# Patient Record
Sex: Female | Born: 1955 | Race: White | Hispanic: No | Marital: Married | Smoking: Never smoker
Health system: Southern US, Community
[De-identification: ages and names within clinical notes are randomized; demographics above are authoritative.]

## PROBLEM LIST (undated history)

## (undated) DIAGNOSIS — M109 Gout, unspecified: Secondary | ICD-10-CM

## (undated) DIAGNOSIS — E039 Hypothyroidism, unspecified: Secondary | ICD-10-CM

## (undated) DIAGNOSIS — M1990 Unspecified osteoarthritis, unspecified site: Secondary | ICD-10-CM

## (undated) DIAGNOSIS — E782 Mixed hyperlipidemia: Secondary | ICD-10-CM

## (undated) DIAGNOSIS — M545 Low back pain, unspecified: Secondary | ICD-10-CM

## (undated) DIAGNOSIS — I4819 Other persistent atrial fibrillation: Secondary | ICD-10-CM

## (undated) DIAGNOSIS — E785 Hyperlipidemia, unspecified: Secondary | ICD-10-CM

## (undated) DIAGNOSIS — Z9221 Personal history of antineoplastic chemotherapy: Secondary | ICD-10-CM

## (undated) DIAGNOSIS — E079 Disorder of thyroid, unspecified: Secondary | ICD-10-CM

## (undated) DIAGNOSIS — I340 Nonrheumatic mitral (valve) insufficiency: Secondary | ICD-10-CM

## (undated) DIAGNOSIS — Z7901 Long term (current) use of anticoagulants: Secondary | ICD-10-CM

## (undated) DIAGNOSIS — C50919 Malignant neoplasm of unspecified site of unspecified female breast: Secondary | ICD-10-CM

## (undated) DIAGNOSIS — T884XXA Failed or difficult intubation, initial encounter: Secondary | ICD-10-CM

## (undated) DIAGNOSIS — T7840XA Allergy, unspecified, initial encounter: Secondary | ICD-10-CM

## (undated) DIAGNOSIS — R0609 Other forms of dyspnea: Secondary | ICD-10-CM

## (undated) DIAGNOSIS — N840 Polyp of corpus uteri: Secondary | ICD-10-CM

## (undated) DIAGNOSIS — Z8489 Family history of other specified conditions: Secondary | ICD-10-CM

## (undated) DIAGNOSIS — I4891 Unspecified atrial fibrillation: Secondary | ICD-10-CM

## (undated) DIAGNOSIS — I499 Cardiac arrhythmia, unspecified: Secondary | ICD-10-CM

## (undated) DIAGNOSIS — Z923 Personal history of irradiation: Secondary | ICD-10-CM

## (undated) DIAGNOSIS — I952 Hypotension due to drugs: Secondary | ICD-10-CM

## (undated) DIAGNOSIS — M810 Age-related osteoporosis without current pathological fracture: Secondary | ICD-10-CM

## (undated) DIAGNOSIS — I10 Essential (primary) hypertension: Secondary | ICD-10-CM

## (undated) DIAGNOSIS — C50511 Malignant neoplasm of lower-outer quadrant of right female breast: Secondary | ICD-10-CM

## (undated) HISTORY — DX: Essential (primary) hypertension: I10

## (undated) HISTORY — DX: Low back pain, unspecified: M54.50

## (undated) HISTORY — DX: Unspecified atrial fibrillation: I48.91

## (undated) HISTORY — PX: JOINT REPLACEMENT: SHX530

## (undated) HISTORY — DX: Allergy, unspecified, initial encounter: T78.40XA

## (undated) HISTORY — DX: Low back pain: M54.5

## (undated) HISTORY — PX: CATARACT EXTRACTION: SUR2

## (undated) HISTORY — DX: Age-related osteoporosis without current pathological fracture: M81.0

## (undated) HISTORY — PX: REPLACEMENT TOTAL KNEE: SUR1224

## (undated) HISTORY — DX: Hyperlipidemia, unspecified: E78.5

## (undated) HISTORY — DX: Disorder of thyroid, unspecified: E07.9

---
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
## 2008-10-03 HISTORY — PX: REPLACEMENT TOTAL KNEE: SUR1224

## 2009-01-26 ENCOUNTER — Ambulatory Visit: Payer: Self-pay | Admitting: Unknown Physician Specialty

## 2009-02-02 ENCOUNTER — Ambulatory Visit: Payer: Self-pay | Admitting: Orthopedic Surgery

## 2009-02-10 ENCOUNTER — Inpatient Hospital Stay: Payer: Self-pay | Admitting: Orthopedic Surgery

## 2009-03-11 ENCOUNTER — Encounter: Payer: Self-pay | Admitting: Orthopedic Surgery

## 2009-04-02 ENCOUNTER — Encounter: Payer: Self-pay | Admitting: Orthopedic Surgery

## 2009-05-03 ENCOUNTER — Encounter: Payer: Self-pay | Admitting: Orthopedic Surgery

## 2009-06-04 LAB — HM MAMMOGRAPHY

## 2012-10-05 ENCOUNTER — Ambulatory Visit: Payer: Self-pay

## 2014-01-01 LAB — HM MAMMOGRAPHY

## 2014-01-01 LAB — HM PAP SMEAR

## 2014-02-20 DIAGNOSIS — I340 Nonrheumatic mitral (valve) insufficiency: Secondary | ICD-10-CM | POA: Insufficient documentation

## 2015-04-02 DIAGNOSIS — I10 Essential (primary) hypertension: Secondary | ICD-10-CM | POA: Insufficient documentation

## 2015-04-02 DIAGNOSIS — E782 Mixed hyperlipidemia: Secondary | ICD-10-CM | POA: Insufficient documentation

## 2015-05-01 ENCOUNTER — Other Ambulatory Visit: Payer: Self-pay | Admitting: Unknown Physician Specialty

## 2015-05-01 MED ORDER — LISINOPRIL-HYDROCHLOROTHIAZIDE 20-25 MG PO TABS: 1.0000 | ORAL_TABLET | Freq: Every day | ORAL | Status: DC

## 2015-07-24 ENCOUNTER — Encounter: Payer: Self-pay | Admitting: Family Medicine

## 2015-07-24 ENCOUNTER — Ambulatory Visit (INDEPENDENT_AMBULATORY_CARE_PROVIDER_SITE_OTHER): Payer: BLUE CROSS/BLUE SHIELD | Admitting: Family Medicine

## 2015-07-24 ENCOUNTER — Other Ambulatory Visit: Payer: Self-pay | Admitting: Family Medicine

## 2015-07-24 VITALS — BP 171/87 | HR 63 | Temp 98.2°F | Ht 61.4 in | Wt 274.0 lb

## 2015-07-24 DIAGNOSIS — B029 Zoster without complications: Secondary | ICD-10-CM

## 2015-07-24 DIAGNOSIS — E785 Hyperlipidemia, unspecified: Secondary | ICD-10-CM

## 2015-07-24 DIAGNOSIS — I10 Essential (primary) hypertension: Secondary | ICD-10-CM

## 2015-07-24 DIAGNOSIS — E039 Hypothyroidism, unspecified: Secondary | ICD-10-CM

## 2015-07-24 MED ORDER — VALACYCLOVIR HCL 1 G PO TABS: 1000.0000 mg | ORAL_TABLET | Freq: Three times a day (TID) | ORAL | Status: DC

## 2015-07-24 NOTE — Patient Instructions (Signed)

## 2015-07-24 NOTE — Progress Notes (Signed)
BP 171/87 mmHg  Pulse 63  Temp(Src) 98.2 F (36.8 C)  Ht 5' 1.4" (1.56 m)  Wt 274 lb (124.286 kg)  BMI 51.07 kg/m2  SpO2 97%   Subjective:    Patient ID: Wendy Marsh, female    DOB: Nov 25, 1955, 59 y.o.   MRN: [id number]  HPI: ALZENA Marsh is a 59 y.o. female  Chief Complaint  Patient presents with  . Rash    numbness X 1 week, noticed a rash yesterday0   RASH Duration:  Started yesterday  Location: trunk  Itching: yes Burning: yes Redness: yes Oozing: no Scaling: yes Blisters: yes Painful: yes Fevers: no Change in detergents/soaps/personal care products: no Recent illness: no Recent travel:no History of same: no Context: worse Alleviating factors: nothing Treatments attempted:nothing Shortness of breath: no  Throat/tongue swelling: no Myalgias/arthralgias: no  Relevant past medical, surgical, family and social history reviewed and updated as indicated. Interim medical history since our last visit reviewed. Allergies and medications reviewed and updated.  Review of Systems  Constitutional: Negative.   Respiratory: Negative.   Cardiovascular: Negative.   Musculoskeletal: Negative.   Skin: Positive for rash. Negative for color change, pallor and wound.  Psychiatric/Behavioral: Negative.     Per HPI unless specifically indicated above     Objective:    BP 171/87 mmHg  Pulse 63  Temp(Src) 98.2 F (36.8 C)  Ht 5' 1.4" (1.56 m)  Wt 274 lb (124.286 kg)  BMI 51.07 kg/m2  SpO2 97%  Wt Readings from Last 3 Encounters:  07/24/15 274 lb (124.286 kg)  07/24/15 282 lb (127.914 kg)    Physical Exam  Constitutional: She is oriented to person, place, and time. She appears well-developed and well-nourished. No distress.  HENT:  Head: Normocephalic and atraumatic.  Right Ear: Hearing normal.  Left Ear: Hearing normal.  Nose: Nose normal.  Eyes: Conjunctivae and lids are normal. Right eye exhibits no discharge. Left eye exhibits no discharge. No  scleral icterus.  Cardiovascular: Normal rate, regular rhythm, normal heart sounds and intact distal pulses.  Exam reveals no gallop and no friction rub.   No murmur heard. Pulmonary/Chest: Effort normal and breath sounds normal. No respiratory distress. She has no wheezes. She has no rales. She exhibits no tenderness.  Musculoskeletal: Normal range of motion.  Neurological: She is alert and oriented to person, place, and time.  Skin: Skin is dry and intact. Rash noted. No erythema. No pallor.  Vesicular rash in dermatomal pattern on the R side of her belly  Psychiatric: She has a normal mood and affect. Her speech is normal and behavior is normal. Judgment and thought content normal. Cognition and memory are normal.  Nursing note and vitals reviewed.   Results for orders placed or performed in visit on 07/24/15  HM MAMMOGRAPHY  Result Value Ref Range   HM Mammogram Practice Partner   HM MAMMOGRAPHY  Result Value Ref Range   HM Mammogram Practice Partner   HM MAMMOGRAPHY  Result Value Ref Range   HM Mammogram Practice Partner   HM PAP SMEAR  Result Value Ref Range   HM Pap smear Practice Partner       Assessment & Plan:   Problem List Items Addressed This Visit    None    Visit Diagnoses    Shingles    -  Primary    Will treat with valacyclovir. Information given. Call if not getting better or getting worse.     Relevant Medications  valACYclovir (VALTREX) 1000 MG tablet        Follow up plan: Return if symptoms worsen or fail to improve.

## 2015-07-27 ENCOUNTER — Telehealth: Payer: Self-pay | Admitting: Family Medicine

## 2015-07-27 NOTE — Telephone Encounter (Signed)
Once the rash is dry, she can go back

## 2015-07-27 NOTE — Telephone Encounter (Signed)
Patient notified, she will need a work note, told patient to contact us when the rash dries up.

## 2015-07-27 NOTE — Telephone Encounter (Signed)
Forward to provider

## 2015-07-27 NOTE — Telephone Encounter (Signed)
Pt called and stated that she was wondering when she could go back to work. She says she works in [employer] and that one of her coworkers is pregnant.

## 2015-07-29 ENCOUNTER — Ambulatory Visit: Payer: Self-pay | Admitting: Unknown Physician Specialty

## 2015-08-03 ENCOUNTER — Telehealth: Payer: Self-pay | Admitting: Family Medicine

## 2015-08-03 NOTE — Telephone Encounter (Signed)
Pt would like a note to go back to work.

## 2015-08-03 NOTE — Telephone Encounter (Signed)
Is it ok to write a return to work note for this patient?

## 2015-08-03 NOTE — Telephone Encounter (Signed)
As long as her rash is dried up we can send her back to work

## 2015-08-14 ENCOUNTER — Other Ambulatory Visit: Payer: Self-pay

## 2015-08-14 MED ORDER — METOPROLOL SUCCINATE ER 50 MG PO TB24: 50.0000 mg | ORAL_TABLET | Freq: Every day | ORAL | Status: DC

## 2015-08-14 NOTE — Telephone Encounter (Signed)
Patient was last seen in October with Dr. Lenna Sciara and last saw [facility] on 02/23/15. Practice partner number is [phone], and pharmacy is [facility].

## 2015-09-15 ENCOUNTER — Other Ambulatory Visit: Payer: Self-pay

## 2015-09-15 MED ORDER — METOPROLOL SUCCINATE ER 50 MG PO TB24: 50.0000 mg | ORAL_TABLET | Freq: Every day | ORAL | Status: DC

## 2015-09-15 NOTE — Telephone Encounter (Signed)
Patient was seen in October for shingles and last saw Malachy Mood for a physical 02/23/15. Practice partner number is [phone] and pharmacy is [facility].

## 2015-09-22 ENCOUNTER — Encounter: Payer: Self-pay | Admitting: Family Medicine

## 2015-09-22 DIAGNOSIS — M1711 Unilateral primary osteoarthritis, right knee: Secondary | ICD-10-CM | POA: Insufficient documentation

## 2015-10-19 ENCOUNTER — Other Ambulatory Visit: Payer: Self-pay

## 2015-10-19 MED ORDER — METOPROLOL SUCCINATE ER 50 MG PO TB24: 50.0000 mg | ORAL_TABLET | Freq: Every day | ORAL | Status: DC

## 2015-10-19 NOTE — Telephone Encounter (Signed)
Patient was last seen for physical by Malachy Mood 02/23/15 and saw Dr. Wynetta Emery for shingles in October. Pharmacy is [facility].

## 2015-11-11 ENCOUNTER — Other Ambulatory Visit: Payer: Self-pay

## 2015-11-11 MED ORDER — METOPROLOL SUCCINATE ER 50 MG PO TB24: 50.0000 mg | ORAL_TABLET | Freq: Every day | ORAL | Status: DC

## 2015-11-11 NOTE — Telephone Encounter (Signed)
Patient has physical scheduled for 02/26/16. Pharmacy is [facility].

## 2015-11-19 ENCOUNTER — Other Ambulatory Visit: Payer: Self-pay | Admitting: Orthopedic Surgery

## 2015-11-19 DIAGNOSIS — M1711 Unilateral primary osteoarthritis, right knee: Secondary | ICD-10-CM

## 2015-11-23 ENCOUNTER — Ambulatory Visit
Admission: RE | Admit: 2015-11-23 | Discharge: 2015-11-23 | Disposition: A | Discharge status: Home / Self Care | Payer: BLUE CROSS/BLUE SHIELD | Source: Ambulatory Visit | Attending: Orthopedic Surgery | Admitting: Orthopedic Surgery

## 2015-11-23 DIAGNOSIS — M2241 Chondromalacia patellae, right knee: Secondary | ICD-10-CM | POA: Insufficient documentation

## 2015-11-23 DIAGNOSIS — M25561 Pain in right knee: Secondary | ICD-10-CM | POA: Insufficient documentation

## 2015-11-23 DIAGNOSIS — M25461 Effusion, right knee: Secondary | ICD-10-CM | POA: Diagnosis not present

## 2015-11-23 DIAGNOSIS — M1711 Unilateral primary osteoarthritis, right knee: Secondary | ICD-10-CM | POA: Insufficient documentation

## 2015-11-23 DIAGNOSIS — S83231A Complex tear of medial meniscus, current injury, right knee, initial encounter: Secondary | ICD-10-CM | POA: Insufficient documentation

## 2015-12-10 ENCOUNTER — Other Ambulatory Visit: Payer: Self-pay

## 2015-12-10 MED ORDER — METOPROLOL SUCCINATE ER 50 MG PO TB24: 50.0000 mg | ORAL_TABLET | Freq: Every day | ORAL | Status: DC

## 2015-12-10 NOTE — Telephone Encounter (Signed)
Patient has physical scheduled for 02/26/16 and pharmacy is [facility].

## 2016-01-18 ENCOUNTER — Other Ambulatory Visit: Payer: Self-pay

## 2016-01-18 MED ORDER — METOPROLOL SUCCINATE ER 50 MG PO TB24: 50.0000 mg | ORAL_TABLET | Freq: Every day | ORAL | Status: DC

## 2016-01-18 NOTE — Telephone Encounter (Signed)
Patient has upcoming appointment 02/26/16 and pharmacy is [facility].

## 2016-01-22 DIAGNOSIS — I480 Paroxysmal atrial fibrillation: Secondary | ICD-10-CM | POA: Insufficient documentation

## 2016-02-10 ENCOUNTER — Other Ambulatory Visit: Payer: Self-pay

## 2016-02-10 MED ORDER — METOPROLOL SUCCINATE ER 50 MG PO TB24: 50.0000 mg | ORAL_TABLET | Freq: Every day | ORAL | Status: DC

## 2016-02-10 NOTE — Telephone Encounter (Signed)
Patient has upcoming appointment 02/26/16. Pharmacy is [facility].

## 2016-02-26 ENCOUNTER — Encounter: Payer: Self-pay | Admitting: Unknown Physician Specialty

## 2016-02-26 ENCOUNTER — Ambulatory Visit (INDEPENDENT_AMBULATORY_CARE_PROVIDER_SITE_OTHER): Payer: BLUE CROSS/BLUE SHIELD | Admitting: Unknown Physician Specialty

## 2016-02-26 VITALS — BP 165/88 | HR 71 | Temp 98.3°F | Ht 61.8 in | Wt 284.8 lb

## 2016-02-26 DIAGNOSIS — Z0000 Encounter for general adult medical examination without abnormal findings: Secondary | ICD-10-CM

## 2016-02-26 DIAGNOSIS — I10 Essential (primary) hypertension: Secondary | ICD-10-CM | POA: Diagnosis not present

## 2016-02-26 DIAGNOSIS — E039 Hypothyroidism, unspecified: Secondary | ICD-10-CM

## 2016-02-26 LAB — MICROALBUMIN, URINE WAIVED
CREATININE, URINE WAIVED: 100 mg/dL (ref 10–300)
Microalb, Ur Waived: 10 mg/L (ref 0–19)

## 2016-02-26 MED ORDER — LEVOTHYROXINE SODIUM 175 MCG PO TABS: 175.0000 ug | ORAL_TABLET | Freq: Every day | ORAL | Status: DC

## 2016-02-26 MED ORDER — LISINOPRIL-HYDROCHLOROTHIAZIDE 20-25 MG PO TABS: 1.0000 | ORAL_TABLET | Freq: Every day | ORAL | Status: DC

## 2016-02-26 MED ORDER — METOPROLOL SUCCINATE ER 50 MG PO TB24: 50.0000 mg | ORAL_TABLET | Freq: Every day | ORAL | Status: DC

## 2016-02-26 NOTE — Assessment & Plan Note (Signed)
Just had a visit with the cardiologist last month and it was good.  She will monitor at home.  Will give a handout on dash diet

## 2016-02-26 NOTE — Progress Notes (Signed)
BP 165/88 mmHg  Pulse 71  Temp(Src) 98.3 F (36.8 C)  Ht 5' 1.8" (1.57 m)  Wt 284 lb 12.8 oz (129.184 kg)  BMI 52.41 kg/m2  SpO2 95%  LMP 03/17/2009 (Approximate)   Subjective:    Patient ID: Wendy Marsh, female    DOB: [birth year], 60 y.o.   MRN: [id number]  HPI: [name] is a 60 y.o. female  Chief Complaint  Patient presents with  . Annual Exam   Hypertension Using medications without difficulty Average home BPs this was OK at the cardiologist last and had a full w/u.     No problems or lightheadedness No chest pain with exertion or shortness of breath No Edema  Social History   Social History  . Marital Status: Married    Spouse Name: N/A  . Number of Children: N/A  . Years of Education: N/A   Occupational History  . Not on file.   Social History Main Topics  . Smoking status: Never Smoker   . Smokeless tobacco: Never Used  . Alcohol Use: No  . Drug Use: No  . Sexual Activity: Yes    Birth Control/ Protection: Post-menopausal   Other Topics Concern  . Not on file   Social History Narrative   Past Medical History  Diagnosis Date  . Osteoporosis   . Hyperlipidemia   . Hypertension   . Thyroid disease   . Asthma   . Lumbago   . Atrial fibrillation [facility])    Past Surgical History  Procedure Laterality Date  . Replacement total knee     Family History  Problem Relation Age of Onset  . Diabetes Mother   . Cancer Father     ESOPHAGEAL  . Heart disease Father   . Cancer Sister     BREAST AND ESOPHAGEAL  . Diabetes Sister   . Hypertension Sister   . Kidney disease Sister   . Thyroid disease Sister       Relevant past medical, surgical, family and social history reviewed and updated as indicated. Interim medical history since our last visit reviewed. Allergies and medications reviewed and updated.  Review of Systems  Constitutional: Positive for activity change.       Decreased activity due to knee pain  HENT: Negative.    Eyes: Negative.   Respiratory: Negative.   Cardiovascular: Negative.   Gastrointestinal: Negative.   Endocrine: Negative.   Genitourinary: Negative.   Musculoskeletal:       Trouble with right knee and under the care of Orthopedics  Skin: Negative.   Allergic/Immunologic: Negative.   Neurological: Negative.   Hematological: Negative.   Psychiatric/Behavioral: Negative.     Per HPI unless specifically indicated above     Objective:    BP 165/88 mmHg  Pulse 71  Temp(Src) 98.3 F (36.8 C)  Ht 5' 1.8" (1.57 m)  Wt 284 lb 12.8 oz (129.184 kg)  BMI 52.41 kg/m2  SpO2 95%  LMP 03/17/2009 (Approximate)  Wt Readings from Last 3 Encounters:  02/26/16 284 lb 12.8 oz (129.184 kg)  07/24/15 274 lb (124.286 kg)  07/24/15 282 lb (127.914 kg)    Physical Exam  Constitutional: She is oriented to person, place, and time. She appears well-developed and well-nourished.  HENT:  Head: Normocephalic and atraumatic.  Eyes: Pupils are equal, round, and reactive to light. Right eye exhibits no discharge. Left eye exhibits no discharge. No scleral icterus.  Neck: Normal range of motion. Neck supple. Carotid bruit is not  present. No thyromegaly present.  Cardiovascular: Normal rate, regular rhythm and normal heart sounds.  Exam reveals no gallop and no friction rub.   No murmur heard. Pulmonary/Chest: Effort normal and breath sounds normal. No respiratory distress. She has no wheezes. She has no rales.  Abdominal: Soft. Bowel sounds are normal. There is no tenderness. There is no rebound.  Genitourinary: No breast swelling, tenderness or discharge.  Musculoskeletal: Normal range of motion.  Lymphadenopathy:    She has no cervical adenopathy.  Neurological: She is alert and oriented to person, place, and time.  Skin: Skin is warm, dry and intact. No rash noted.  Psychiatric: She has a normal mood and affect. Her speech is normal and behavior is normal. Judgment and thought content normal.  Cognition and memory are normal.    Results for orders placed or performed in visit on 07/24/15  HM MAMMOGRAPHY  Result Value Ref Range   HM Mammogram Practice Partner   HM MAMMOGRAPHY  Result Value Ref Range   HM Mammogram Practice Partner   HM MAMMOGRAPHY  Result Value Ref Range   HM Mammogram Practice Partner   HM PAP SMEAR  Result Value Ref Range   HM Pap smear Practice Partner       Assessment & Plan:   Problem List Items Addressed This Visit      Unprioritized   Hypertension - Primary    Just had a visit with the cardiologist last month and it was good.  She will monitor at home.  Will give a handout on dash diet      Relevant Medications   lisinopril-hydrochlorothiazide (PRINZIDE,ZESTORETIC) 20-25 MG tablet   metoprolol succinate (TOPROL-XL) 50 MG 24 hr tablet   Other Relevant Orders   Comprehensive metabolic panel   Lipid Panel w/o Chol/HDL Ratio   Uric acid   Microalbumin, Urine Waived   Thyroid activity decreased    Not taking thyroid medication as she forgot about it.  Will restart      Relevant Medications   levothyroxine (SYNTHROID, LEVOTHROID) 175 MCG tablet   metoprolol succinate (TOPROL-XL) 50 MG 24 hr tablet   Other Relevant Orders   TSH    Other Visit Diagnoses    Annual physical exam        Relevant Orders    MM DIGITAL SCREENING BILATERAL    CBC with Differential/Platelet        Follow up plan: Return in about 4 weeks (around 03/25/2016) for BP.

## 2016-02-26 NOTE — Patient Instructions (Signed)
DASH Eating Plan  DASH stands for "Dietary Approaches to Stop Hypertension." The DASH eating plan is a healthy eating plan that has been shown to reduce high blood pressure (hypertension). Additional health benefits may include reducing the risk of type 2 diabetes mellitus, heart disease, and stroke. The DASH eating plan may also help with weight loss.  WHAT DO I NEED TO KNOW ABOUT THE DASH EATING PLAN?  For the DASH eating plan, you will follow these general guidelines:  · Choose foods with a percent daily value for sodium of less than 5% (as listed on the food label).  · Use salt-free seasonings or herbs instead of table salt or sea salt.  · Check with your health care provider or pharmacist before using salt substitutes.  · Eat lower-sodium products, often labeled as "lower sodium" or "no salt added."  · Eat fresh foods.  · Eat more vegetables, fruits, and low-fat dairy products.  · Choose whole grains. Look for the word "whole" as the first word in the ingredient list.  · Choose fish and skinless chicken or turkey more often than red meat. Limit fish, poultry, and meat to 6 oz (170 g) each day.  · Limit sweets, desserts, sugars, and sugary drinks.  · Choose heart-healthy fats.  · Limit cheese to 1 oz (28 g) per day.  · Eat more home-cooked food and less restaurant, buffet, and fast food.  · Limit fried foods.  · Cook foods using methods other than frying.  · Limit canned vegetables. If you do use them, rinse them well to decrease the sodium.  · When eating at a restaurant, ask that your food be prepared with less salt, or no salt if possible.  WHAT FOODS CAN I EAT?  Seek help from a dietitian for individual calorie needs.  Grains  Whole grain or whole wheat bread. Brown rice. Whole grain or whole wheat pasta. Quinoa, bulgur, and whole grain cereals. Low-sodium cereals. Corn or whole wheat flour tortillas. Whole grain cornbread. Whole grain crackers. Low-sodium crackers.  Vegetables  Fresh or frozen vegetables  (raw, steamed, roasted, or grilled). Low-sodium or reduced-sodium tomato and vegetable juices. Low-sodium or reduced-sodium tomato sauce and paste. Low-sodium or reduced-sodium canned vegetables.   Fruits  All fresh, canned (in natural juice), or frozen fruits.  Meat and Other Protein Products  Ground beef (85% or leaner), grass-fed beef, or beef trimmed of fat. Skinless chicken or turkey. Ground chicken or turkey. Pork trimmed of fat. All fish and seafood. Eggs. Dried beans, peas, or lentils. Unsalted nuts and seeds. Unsalted canned beans.  Dairy  Low-fat dairy products, such as skim or 1% milk, 2% or reduced-fat cheeses, low-fat ricotta or cottage cheese, or plain low-fat yogurt. Low-sodium or reduced-sodium cheeses.  Fats and Oils  Tub margarines without trans fats. Light or reduced-fat mayonnaise and salad dressings (reduced sodium). Avocado. Safflower, olive, or canola oils. Natural peanut or almond butter.  Other  Unsalted popcorn and pretzels.  The items listed above may not be a complete list of recommended foods or beverages. Contact your dietitian for more options.  WHAT FOODS ARE NOT RECOMMENDED?  Grains  White bread. White pasta. White rice. Refined cornbread. Bagels and croissants. Crackers that contain trans fat.  Vegetables  Creamed or fried vegetables. Vegetables in a cheese sauce. Regular canned vegetables. Regular canned tomato sauce and paste. Regular tomato and vegetable juices.  Fruits  Dried fruits. Canned fruit in light or heavy syrup. Fruit juice.  Meat and Other Protein   Products  Fatty cuts of meat. Ribs, chicken wings, bacon, sausage, bologna, salami, chitterlings, fatback, hot dogs, bratwurst, and packaged luncheon meats. Salted nuts and seeds. Canned beans with salt.  Dairy  Whole or 2% milk, cream, half-and-half, and cream cheese. Whole-fat or sweetened yogurt. Full-fat cheeses or blue cheese. Nondairy creamers and whipped toppings. Processed cheese, cheese spreads, or cheese  curds.  Condiments  Onion and garlic salt, seasoned salt, table salt, and sea salt. Canned and packaged gravies. Worcestershire sauce. Tartar sauce. Barbecue sauce. Teriyaki sauce. Soy sauce, including reduced sodium. Steak sauce. Fish sauce. Oyster sauce. Cocktail sauce. Horseradish. Ketchup and mustard. Meat flavorings and tenderizers. Bouillon cubes. Hot sauce. Tabasco sauce. Marinades. Taco seasonings. Relishes.  Fats and Oils  Butter, stick margarine, lard, shortening, ghee, and bacon fat. Coconut, palm kernel, or palm oils. Regular salad dressings.  Other  Pickles and olives. Salted popcorn and pretzels.  The items listed above may not be a complete list of foods and beverages to avoid. Contact your dietitian for more information.  WHERE CAN I FIND MORE INFORMATION?  National Heart, Lung, and Blood Institute: www.nhlbi.nih.gov/health/health-topics/topics/dash/     This information is not intended to replace advice given to you by your health care provider. Make sure you discuss any questions you have with your health care provider.     Document Released: 09/08/2011 Document Revised: 10/10/2014 Document Reviewed: 07/24/2013  Elsevier Interactive Patient Education ©2016 Elsevier Inc.

## 2016-02-26 NOTE — Assessment & Plan Note (Signed)
Not taking thyroid medication as she forgot about it.  Will restart

## 2016-02-26 NOTE — Addendum Note (Signed)
Addended by: Kathrine Haddock on: 02/26/2016 03:18 PM   Modules accepted: Miquel Dunn

## 2016-02-27 LAB — COMPREHENSIVE METABOLIC PANEL
ALBUMIN: 4.3 g/dL (ref 3.6–4.8)
ALK PHOS: 75 IU/L (ref 39–117)
ALT: 13 IU/L (ref 0–32)
AST: 14 IU/L (ref 0–40)
Albumin/Globulin Ratio: 1.6 (ref 1.2–2.2)
BUN / CREAT RATIO: 20 (ref 12–28)
BUN: 13 mg/dL (ref 8–27)
Bilirubin Total: 0.2 mg/dL (ref 0.0–1.2)
CO2: 28 mmol/L (ref 18–29)
CREATININE: 0.66 mg/dL (ref 0.57–1.00)
Calcium: 9.6 mg/dL (ref 8.7–10.3)
Chloride: 97 mmol/L (ref 96–106)
GFR calc Af Amer: 111 mL/min/{1.73_m2} (ref >59)
GFR calc non Af Amer: 96 mL/min/{1.73_m2} (ref >59)
GLUCOSE: 89 mg/dL (ref 65–99)
Globulin, Total: 2.7 g/dL (ref 1.5–4.5)
Potassium: 4 mmol/L (ref 3.5–5.2)
Sodium: 143 mmol/L (ref 134–144)
Total Protein: 7 g/dL (ref 6.0–8.5)

## 2016-02-27 LAB — CBC WITH DIFFERENTIAL/PLATELET
BASOS ABS: 0 10*3/uL (ref 0.0–0.2)
Basos: 0 %
EOS (ABSOLUTE): 0.4 10*3/uL (ref 0.0–0.4)
Eos: 4 %
HEMOGLOBIN: 11.8 g/dL (ref 11.1–15.9)
Hematocrit: 37.8 % (ref 34.0–46.6)
IMMATURE GRANS (ABS): 0 10*3/uL (ref 0.0–0.1)
IMMATURE GRANULOCYTES: 0 %
LYMPHS: 17 %
Lymphocytes Absolute: 1.7 10*3/uL (ref 0.7–3.1)
MCH: 26.9 pg (ref 26.6–33.0)
MCHC: 31.2 g/dL — ABNORMAL LOW (ref 31.5–35.7)
MCV: 86 fL (ref 79–97)
MONOCYTES: 6 %
Monocytes Absolute: 0.6 10*3/uL (ref 0.1–0.9)
NEUTROS PCT: 73 %
Neutrophils Absolute: 7.2 10*3/uL — ABNORMAL HIGH (ref 1.4–7.0)
PLATELETS: 335 10*3/uL (ref 150–379)
RBC: 4.39 x10E6/uL (ref 3.77–5.28)
RDW: 15.1 % (ref 12.3–15.4)
WBC: 9.8 10*3/uL (ref 3.4–10.8)

## 2016-02-27 LAB — LIPID PANEL W/O CHOL/HDL RATIO
CHOLESTEROL TOTAL: 232 mg/dL — AB (ref 100–199)
HDL: 56 mg/dL (ref >39)
LDL CALC: 153 mg/dL — AB (ref 0–99)
Triglycerides: 114 mg/dL (ref 0–149)
VLDL CHOLESTEROL CAL: 23 mg/dL (ref 5–40)

## 2016-02-27 LAB — URIC ACID: URIC ACID: 6.4 mg/dL (ref 2.5–7.1)

## 2016-02-27 LAB — TSH: TSH: 8.63 u[IU]/mL — AB (ref 0.450–4.500)

## 2016-03-01 ENCOUNTER — Encounter: Payer: Self-pay | Admitting: Unknown Physician Specialty

## 2016-03-14 ENCOUNTER — Other Ambulatory Visit: Payer: Self-pay

## 2016-03-14 NOTE — Telephone Encounter (Signed)
Called and spoke to patient because this pharmacy was not listed as her preferred pharmacy. She stated that she is now using them and needs her medications sent to them. Pharmacy is [facility].

## 2016-03-15 MED ORDER — LEVOTHYROXINE SODIUM 175 MCG PO TABS: 175.0000 ug | ORAL_TABLET | Freq: Every day | ORAL | Status: DC

## 2016-03-15 MED ORDER — METOPROLOL SUCCINATE ER 50 MG PO TB24: 50.0000 mg | ORAL_TABLET | Freq: Every day | ORAL | Status: DC

## 2016-03-25 ENCOUNTER — Ambulatory Visit: Payer: BLUE CROSS/BLUE SHIELD | Admitting: Family Medicine

## 2016-04-11 ENCOUNTER — Encounter: Payer: Self-pay | Admitting: Family Medicine

## 2016-04-11 ENCOUNTER — Ambulatory Visit (INDEPENDENT_AMBULATORY_CARE_PROVIDER_SITE_OTHER): Payer: BLUE CROSS/BLUE SHIELD | Admitting: Family Medicine

## 2016-04-11 VITALS — BP 146/81 | HR 62 | Temp 98.9°F | Ht 61.8 in | Wt 286.0 lb

## 2016-04-11 DIAGNOSIS — I10 Essential (primary) hypertension: Secondary | ICD-10-CM | POA: Diagnosis not present

## 2016-04-11 DIAGNOSIS — E039 Hypothyroidism, unspecified: Secondary | ICD-10-CM

## 2016-04-11 MED ORDER — METOPROLOL SUCCINATE ER 50 MG PO TB24: 50.0000 mg | ORAL_TABLET | Freq: Every day | ORAL | Status: DC

## 2016-04-11 MED ORDER — LISINOPRIL-HYDROCHLOROTHIAZIDE 20-25 MG PO TABS: 1.0000 | ORAL_TABLET | Freq: Every day | ORAL | Status: DC

## 2016-04-11 NOTE — Assessment & Plan Note (Signed)
Better on recheck. Work on diet and exercise. Checking labs today. Refills on medicine given today.

## 2016-04-11 NOTE — Assessment & Plan Note (Signed)
Rechecking levels today. Will adjust as needed. Call with any concerns.  

## 2016-04-11 NOTE — Progress Notes (Signed)
BP 146/81 mmHg  Pulse 62  Temp(Src) 98.9 F (37.2 C)  Ht 5' 1.8" (1.57 m)  Wt 286 lb (129.729 kg)  BMI 52.63 kg/m2  SpO2 96%  LMP 03/17/2009 (Approximate)   Subjective:    Patient ID: Wendy Marsh, female    DOB: [birth year], 60 y.o.   MRN: [id number]  HPI: Wendy Marsh is a 61 y.o. female  Chief Complaint  Patient presents with  . Hypertension  . Hypothyroidism   HYPERTENSION Hypertension status: stable  Satisfied with current treatment? yes Duration of hypertension: chronic BP monitoring frequency:  not checking BP medication side effects:  no Medication compliance: good compliance Aspirin: yes Recurrent headaches: no Visual changes: no Palpitations: yes Dyspnea: no Chest pain: no Lower extremity edema: yes Dizzy/lightheaded: no  HYPOTHYROIDISM Thyroid control status:better Satisfied with current treatment? yes Medication side effects: no Medication compliance: good compliance Recent dose adjustment:no Fatigue: yes Cold intolerance: no Heat intolerance: no Weight gain: no Weight loss: no Constipation: no Diarrhea/loose stools: no Palpitations: yes- with her a fibs Lower extremity edema: yes- not overly concerned, worse in the last year, but more sendentary- better with walking Anxiety/depressed mood: no  Relevant past medical, surgical, family and social history reviewed and updated as indicated. Interim medical history since our last visit reviewed. Allergies and medications reviewed and updated.  Review of Systems  Constitutional: Negative.   Respiratory: Negative.   Cardiovascular: Negative.   Musculoskeletal: Negative for myalgias, back pain, joint swelling, arthralgias, gait problem, neck pain and neck stiffness.  Psychiatric/Behavioral: Negative.     Per HPI unless specifically indicated above     Objective:    BP 146/81 mmHg  Pulse 62  Temp(Src) 98.9 F (37.2 C)  Ht 5' 1.8" (1.57 m)  Wt 286 lb (129.729 kg)  BMI 52.63 kg/m2   SpO2 96%  LMP 03/17/2009 (Approximate)  Wt Readings from Last 3 Encounters:  04/11/16 286 lb (129.729 kg)  02/26/16 284 lb 12.8 oz (129.184 kg)  07/24/15 274 lb (124.286 kg)    Physical Exam  Constitutional: She is oriented to person, place, and time. She appears well-developed and well-nourished. No distress.  HENT:  Head: Normocephalic and atraumatic.  Right Ear: Hearing normal.  Left Ear: Hearing normal.  Nose: Nose normal.  Eyes: Conjunctivae and lids are normal. Right eye exhibits no discharge. Left eye exhibits no discharge. No scleral icterus.  Cardiovascular: Normal rate, regular rhythm and intact distal pulses.  Exam reveals no gallop and no friction rub.   No murmur heard. Pulmonary/Chest: Effort normal and breath sounds normal. No respiratory distress. She has no wheezes. She has no rales. She exhibits no tenderness.  Musculoskeletal: Normal range of motion.  Neurological: She is alert and oriented to person, place, and time.  Skin: Skin is warm, dry and intact. No rash noted. She is not diaphoretic. No erythema. No pallor.  Psychiatric: She has a normal mood and affect. Her speech is normal and behavior is normal. Judgment and thought content normal. Cognition and memory are normal.  Nursing note and vitals reviewed.   Results for orders placed or performed in visit on 02/26/16  Comprehensive metabolic panel  Result Value Ref Range   Glucose 89 65 - 99 mg/dL   BUN 13 8 - 27 mg/dL   Creatinine, Ser 0.66 0.57 - 1.00 mg/dL   GFR calc non Af Amer 96 >59 mL/min/1.73   GFR calc Af Amer 111 >59 mL/min/1.73   BUN/Creatinine Ratio 20 12 - 28  Sodium 143 134 - 144 mmol/L   Potassium 4.0 3.5 - 5.2 mmol/L   Chloride 97 96 - 106 mmol/L   CO2 28 18 - 29 mmol/L   Calcium 9.6 8.7 - 10.3 mg/dL   Total Protein 7.0 6.0 - 8.5 g/dL   Albumin 4.3 3.6 - 4.8 g/dL   Globulin, Total 2.7 1.5 - 4.5 g/dL   Albumin/Globulin Ratio 1.6 1.2 - 2.2   Bilirubin Total 0.2 0.0 - 1.2 mg/dL    Alkaline Phosphatase 75 39 - 117 IU/L   AST 14 0 - 40 IU/L   ALT 13 0 - 32 IU/L  TSH  Result Value Ref Range   TSH 8.630 (H) 0.450 - 4.500 uIU/mL  Lipid Panel w/o Chol/HDL Ratio  Result Value Ref Range   Cholesterol, Total 232 (H) 100 - 199 mg/dL   Triglycerides 114 0 - 149 mg/dL   HDL 56 >39 mg/dL   VLDL Cholesterol Cal 23 5 - 40 mg/dL   LDL Calculated 153 (H) 0 - 99 mg/dL  CBC with Differential/Platelet  Result Value Ref Range   WBC 9.8 3.4 - 10.8 x10E3/uL   RBC 4.39 3.77 - 5.28 x10E6/uL   Hemoglobin 11.8 11.1 - 15.9 g/dL   Hematocrit 37.8 34.0 - 46.6 %   MCV 86 79 - 97 fL   MCH 26.9 26.6 - 33.0 pg   MCHC 31.2 (L) 31.5 - 35.7 g/dL   RDW 15.1 12.3 - 15.4 %   Platelets 335 150 - 379 x10E3/uL   Neutrophils 73 %   Lymphs 17 %   Monocytes 6 %   Eos 4 %   Basos 0 %   Neutrophils Absolute 7.2 (H) 1.4 - 7.0 x10E3/uL   Lymphocytes Absolute 1.7 0.7 - 3.1 x10E3/uL   Monocytes Absolute 0.6 0.1 - 0.9 x10E3/uL   EOS (ABSOLUTE) 0.4 0.0 - 0.4 x10E3/uL   Basophils Absolute 0.0 0.0 - 0.2 x10E3/uL   Immature Granulocytes 0 %   Immature Grans (Abs) 0.0 0.0 - 0.1 x10E3/uL  Uric acid  Result Value Ref Range   Uric Acid 6.4 2.5 - 7.1 mg/dL  Microalbumin, Urine Waived  Result Value Ref Range   Microalb, Ur Waived 10 0 - 19 mg/L   Creatinine, Urine Waived 100 10 - 300 mg/dL   Microalb/Creat Ratio <30 <30 mg/g      Assessment & Plan:   Problem List Items Addressed This Visit      Cardiovascular and Mediastinum   Hypertension    Better on recheck. Work on diet and exercise. Checking labs today. Refills on medicine given today.      Relevant Medications   lisinopril-hydrochlorothiazide (PRINZIDE,ZESTORETIC) 20-25 MG tablet   metoprolol succinate (TOPROL-XL) 50 MG 24 hr tablet   Other Relevant Orders   Comprehensive metabolic panel     Endocrine   Thyroid activity decreased - Primary    Rechecking levels today. Will adjust as needed. Call with any concerns.       Relevant  Medications   metoprolol succinate (TOPROL-XL) 50 MG 24 hr tablet   Other Relevant Orders   Thyroid Panel With TSH       Follow up plan: Return in about 6 months (around 10/12/2016) for BP follow up.

## 2016-04-11 NOTE — Patient Instructions (Signed)
DASH Eating Plan  DASH stands for "Dietary Approaches to Stop Hypertension." The DASH eating plan is a healthy eating plan that has been shown to reduce high blood pressure (hypertension). Additional health benefits may include reducing the risk of type 2 diabetes mellitus, heart disease, and stroke. The DASH eating plan may also help with weight loss.  WHAT DO I NEED TO KNOW ABOUT THE DASH EATING PLAN?  For the DASH eating plan, you will follow these general guidelines:  · Choose foods with a percent daily value for sodium of less than 5% (as listed on the food label).  · Use salt-free seasonings or herbs instead of table salt or sea salt.  · Check with your health care provider or pharmacist before using salt substitutes.  · Eat lower-sodium products, often labeled as "lower sodium" or "no salt added."  · Eat fresh foods.  · Eat more vegetables, fruits, and low-fat dairy products.  · Choose whole grains. Look for the word "whole" as the first word in the ingredient list.  · Choose fish and skinless chicken or turkey more often than red meat. Limit fish, poultry, and meat to 6 oz (170 g) each day.  · Limit sweets, desserts, sugars, and sugary drinks.  · Choose heart-healthy fats.  · Limit cheese to 1 oz (28 g) per day.  · Eat more home-cooked food and less restaurant, buffet, and fast food.  · Limit fried foods.  · Cook foods using methods other than frying.  · Limit canned vegetables. If you do use them, rinse them well to decrease the sodium.  · When eating at a restaurant, ask that your food be prepared with less salt, or no salt if possible.  WHAT FOODS CAN I EAT?  Seek help from a dietitian for individual calorie needs.  Grains  Whole grain or whole wheat bread. Brown rice. Whole grain or whole wheat pasta. Quinoa, bulgur, and whole grain cereals. Low-sodium cereals. Corn or whole wheat flour tortillas. Whole grain cornbread. Whole grain crackers. Low-sodium crackers.  Vegetables  Fresh or frozen vegetables  (raw, steamed, roasted, or grilled). Low-sodium or reduced-sodium tomato and vegetable juices. Low-sodium or reduced-sodium tomato sauce and paste. Low-sodium or reduced-sodium canned vegetables.   Fruits  All fresh, canned (in natural juice), or frozen fruits.  Meat and Other Protein Products  Ground beef (85% or leaner), grass-fed beef, or beef trimmed of fat. Skinless chicken or turkey. Ground chicken or turkey. Pork trimmed of fat. All fish and seafood. Eggs. Dried beans, peas, or lentils. Unsalted nuts and seeds. Unsalted canned beans.  Dairy  Low-fat dairy products, such as skim or 1% milk, 2% or reduced-fat cheeses, low-fat ricotta or cottage cheese, or plain low-fat yogurt. Low-sodium or reduced-sodium cheeses.  Fats and Oils  Tub margarines without trans fats. Light or reduced-fat mayonnaise and salad dressings (reduced sodium). Avocado. Safflower, olive, or canola oils. Natural peanut or almond butter.  Other  Unsalted popcorn and pretzels.  The items listed above may not be a complete list of recommended foods or beverages. Contact your dietitian for more options.  WHAT FOODS ARE NOT RECOMMENDED?  Grains  White bread. White pasta. White rice. Refined cornbread. Bagels and croissants. Crackers that contain trans fat.  Vegetables  Creamed or fried vegetables. Vegetables in a cheese sauce. Regular canned vegetables. Regular canned tomato sauce and paste. Regular tomato and vegetable juices.  Fruits  Dried fruits. Canned fruit in light or heavy syrup. Fruit juice.  Meat and Other Protein   Products  Fatty cuts of meat. Ribs, chicken wings, bacon, sausage, bologna, salami, chitterlings, fatback, hot dogs, bratwurst, and packaged luncheon meats. Salted nuts and seeds. Canned beans with salt.  Dairy  Whole or 2% milk, cream, half-and-half, and cream cheese. Whole-fat or sweetened yogurt. Full-fat cheeses or blue cheese. Nondairy creamers and whipped toppings. Processed cheese, cheese spreads, or cheese  curds.  Condiments  Onion and garlic salt, seasoned salt, table salt, and sea salt. Canned and packaged gravies. Worcestershire sauce. Tartar sauce. Barbecue sauce. Teriyaki sauce. Soy sauce, including reduced sodium. Steak sauce. Fish sauce. Oyster sauce. Cocktail sauce. Horseradish. Ketchup and mustard. Meat flavorings and tenderizers. Bouillon cubes. Hot sauce. Tabasco sauce. Marinades. Taco seasonings. Relishes.  Fats and Oils  Butter, stick margarine, lard, shortening, ghee, and bacon fat. Coconut, palm kernel, or palm oils. Regular salad dressings.  Other  Pickles and olives. Salted popcorn and pretzels.  The items listed above may not be a complete list of foods and beverages to avoid. Contact your dietitian for more information.  WHERE CAN I FIND MORE INFORMATION?  National Heart, Lung, and Blood Institute: www.nhlbi.nih.gov/health/health-topics/topics/dash/     This information is not intended to replace advice given to you by your health care provider. Make sure you discuss any questions you have with your health care provider.     Document Released: 09/08/2011 Document Revised: 10/10/2014 Document Reviewed: 07/24/2013  Elsevier Interactive Patient Education ©2016 Elsevier Inc.

## 2016-04-12 ENCOUNTER — Telehealth: Payer: Self-pay | Admitting: Family Medicine

## 2016-04-12 LAB — COMPREHENSIVE METABOLIC PANEL
ALK PHOS: 68 IU/L (ref 39–117)
ALT: 18 IU/L (ref 0–32)
AST: 18 IU/L (ref 0–40)
Albumin/Globulin Ratio: 1.3 (ref 1.2–2.2)
Albumin: 3.8 g/dL (ref 3.6–4.8)
BUN/Creatinine Ratio: 22 (ref 12–28)
BUN: 15 mg/dL (ref 8–27)
Bilirubin Total: 0.2 mg/dL (ref 0.0–1.2)
CO2: 28 mmol/L (ref 18–29)
CREATININE: 0.69 mg/dL (ref 0.57–1.00)
Calcium: 9.3 mg/dL (ref 8.7–10.3)
Chloride: 100 mmol/L (ref 96–106)
GFR calc Af Amer: 109 mL/min/{1.73_m2} (ref >59)
GFR calc non Af Amer: 94 mL/min/{1.73_m2} (ref >59)
GLOBULIN, TOTAL: 2.9 g/dL (ref 1.5–4.5)
Glucose: 88 mg/dL (ref 65–99)
POTASSIUM: 4.8 mmol/L (ref 3.5–5.2)
SODIUM: 144 mmol/L (ref 134–144)
Total Protein: 6.7 g/dL (ref 6.0–8.5)

## 2016-04-12 LAB — THYROID PANEL WITH TSH
FREE THYROXINE INDEX: 3.6 (ref 1.2–4.9)
T3 UPTAKE RATIO: 31 % (ref 24–39)
T4, Total: 11.7 ug/dL (ref 4.5–12.0)
TSH: 1.26 u[IU]/mL (ref 0.450–4.500)

## 2016-04-12 NOTE — Telephone Encounter (Signed)
Please let her know that her thyroid came back normal, and Malachy Mood had given her a year on that medicine, so she should be good. The rest of her labs also look good. Thanks!

## 2016-04-12 NOTE — Telephone Encounter (Signed)
Called patient, left info on her VM

## 2016-10-12 ENCOUNTER — Encounter: Payer: Self-pay | Admitting: Family Medicine

## 2016-10-12 ENCOUNTER — Ambulatory Visit (INDEPENDENT_AMBULATORY_CARE_PROVIDER_SITE_OTHER): Payer: BLUE CROSS/BLUE SHIELD | Admitting: Family Medicine

## 2016-10-12 VITALS — BP 168/87 | HR 73 | Temp 98.4°F | Wt 292.0 lb

## 2016-10-12 DIAGNOSIS — E782 Mixed hyperlipidemia: Secondary | ICD-10-CM | POA: Diagnosis not present

## 2016-10-12 DIAGNOSIS — I10 Essential (primary) hypertension: Secondary | ICD-10-CM | POA: Diagnosis not present

## 2016-10-12 DIAGNOSIS — Z1231 Encounter for screening mammogram for malignant neoplasm of breast: Secondary | ICD-10-CM

## 2016-10-12 DIAGNOSIS — M1711 Unilateral primary osteoarthritis, right knee: Secondary | ICD-10-CM

## 2016-10-12 MED ORDER — METOPROLOL SUCCINATE ER 50 MG PO TB24: 50.0000 mg | ORAL_TABLET | Freq: Every day | ORAL | 1 refills | Status: DC

## 2016-10-12 MED ORDER — LISINOPRIL-HYDROCHLOROTHIAZIDE 20-25 MG PO TABS: 2.0000 | ORAL_TABLET | Freq: Every day | ORAL | 1 refills | Status: DC

## 2016-10-12 NOTE — Assessment & Plan Note (Signed)
Not under good control. Will increase her lisinopril-HCTZ to 40-50mg  and recheck in 1 month.

## 2016-10-12 NOTE — Assessment & Plan Note (Signed)
Rechecking levels today. Continue to work on diet and exercise. Call with any concerns.

## 2016-10-12 NOTE — Assessment & Plan Note (Signed)
Discussed aquatherapy to try to help with pain and also possibly weight loss. She will consider. If she would like to do this, we will refer her.

## 2016-10-12 NOTE — Progress Notes (Signed)
BP (!) 168/87   Pulse 73   Temp 98.4 F (36.9 C)   Wt 292 lb (132.5 kg)   LMP 03/17/2009 (Approximate)   SpO2 97%   BMI 53.75 kg/m    Subjective:    Patient ID: Wendy Marsh, female    DOB: 12/26/1955, 61 y.o.   MRN: [id number]  HPI: Wendy Marsh is a 61 y.o. female  Chief Complaint  Patient presents with  . Hypertension  . Hyperlipidemia   HYPERTENSION / HYPERLIPIDEMIA Satisfied with current treatment? yes Duration of hypertension: chronic BP monitoring frequency: not checking BP medication side effects: no Past BP meds: lisinopril-hctz, metoprolol Duration of hyperlipidemia: chronic Cholesterol medication side effects: Not on anything Cholesterol supplements: none Past cholesterol medications: None Medication compliance: excellent compliance Aspirin: yes Recent stressors: no Recurrent headaches: no Visual changes: no Palpitations: yes Dyspnea: no Chest pain: no Lower extremity edema: yes Dizzy/lightheaded: no  Relevant past medical, surgical, family and social history reviewed and updated as indicated. Interim medical history since our last visit reviewed. Allergies and medications reviewed and updated.  Review of Systems  Constitutional: Negative.   Respiratory: Negative.   Cardiovascular: Positive for palpitations and leg swelling. Negative for chest pain.  Psychiatric/Behavioral: Negative.     Per HPI unless specifically indicated above     Objective:    BP (!) 168/87   Pulse 73   Temp 98.4 F (36.9 C)   Wt 292 lb (132.5 kg)   LMP 03/17/2009 (Approximate)   SpO2 97%   BMI 53.75 kg/m   Wt Readings from Last 3 Encounters:  10/12/16 292 lb (132.5 kg)  04/11/16 286 lb (129.7 kg)  02/26/16 284 lb 12.8 oz (129.2 kg)    Physical Exam  Constitutional: She is oriented to person, place, and time. She appears well-developed and well-nourished. No distress.  HENT:  Head: Normocephalic and atraumatic.  Right Ear: Hearing and external  ear normal.  Left Ear: Hearing and external ear normal.  Nose: Nose normal.  Mouth/Throat: Oropharynx is clear and moist. No oropharyngeal exudate.  Eyes: Conjunctivae, EOM and lids are normal. Pupils are equal, round, and reactive to light. Right eye exhibits no discharge. Left eye exhibits no discharge. No scleral icterus.  Cardiovascular: Normal rate, regular rhythm, normal heart sounds and intact distal pulses.  Exam reveals no gallop and no friction rub.   No murmur heard. Pulmonary/Chest: Effort normal and breath sounds normal. No respiratory distress. She has no wheezes. She exhibits no tenderness.  Musculoskeletal: Normal range of motion.  Neurological: She is alert and oriented to person, place, and time.  Skin: Skin is warm, dry and intact. No rash noted. She is not diaphoretic. No erythema. No pallor.  Psychiatric: She has a normal mood and affect. Her speech is normal and behavior is normal. Judgment and thought content normal. Cognition and memory are normal.  Nursing note and vitals reviewed.   Results for orders placed or performed in visit on 04/11/16  Thyroid Panel With TSH  Result Value Ref Range   TSH 1.260 0.450 - 4.500 uIU/mL   T4, Total 11.7 4.5 - 12.0 ug/dL   T3 Uptake Ratio 31 24 - 39 %   Free Thyroxine Index 3.6 1.2 - 4.9  Comprehensive metabolic panel  Result Value Ref Range   Glucose 88 65 - 99 mg/dL   BUN 15 8 - 27 mg/dL   Creatinine, Ser 0.69 0.57 - 1.00 mg/dL   GFR calc non Af Amer 94 >59 mL/min/1.73  GFR calc Af Amer 109 >59 mL/min/1.73   BUN/Creatinine Ratio 22 12 - 28   Sodium 144 134 - 144 mmol/L   Potassium 4.8 3.5 - 5.2 mmol/L   Chloride 100 96 - 106 mmol/L   CO2 28 18 - 29 mmol/L   Calcium 9.3 8.7 - 10.3 mg/dL   Total Protein 6.7 6.0 - 8.5 g/dL   Albumin 3.8 3.6 - 4.8 g/dL   Globulin, Total 2.9 1.5 - 4.5 g/dL   Albumin/Globulin Ratio 1.3 1.2 - 2.2   Bilirubin Total 0.2 0.0 - 1.2 mg/dL   Alkaline Phosphatase 68 39 - 117 IU/L   AST 18 0 -  40 IU/L   ALT 18 0 - 32 IU/L      Assessment & Plan:   Problem List Items Addressed This Visit      Cardiovascular and Mediastinum   Hypertension - Primary    Not under good control. Will increase her lisinopril-HCTZ to 40-50mg  and recheck in 1 month.       Relevant Medications   lisinopril-hydrochlorothiazide (PRINZIDE,ZESTORETIC) 20-25 MG tablet   metoprolol succinate (TOPROL-XL) 50 MG 24 hr tablet   Other Relevant Orders   Comprehensive metabolic panel   Microalbumin, Urine Waived     Musculoskeletal and Integument   Primary osteoarthritis of right knee    Discussed aquatherapy to try to help with pain and also possibly weight loss. She will consider. If she would like to do this, we will refer her.         Other   Combined fat and carbohydrate induced hyperlipemia    Rechecking levels today. Continue to work on diet and exercise. Call with any concerns.       Relevant Medications   lisinopril-hydrochlorothiazide (PRINZIDE,ZESTORETIC) 20-25 MG tablet   metoprolol succinate (TOPROL-XL) 50 MG 24 hr tablet   Other Relevant Orders   Comprehensive metabolic panel   Lipid Panel w/o Chol/HDL Ratio    Other Visit Diagnoses    Screening mammogram, encounter for       Order placed today.       Follow up plan: Return in about 4 weeks (around 11/09/2016) for Follow up BP.

## 2016-10-13 LAB — COMPREHENSIVE METABOLIC PANEL
A/G RATIO: 1.6 (ref 1.2–2.2)
ALK PHOS: 83 IU/L (ref 39–117)
ALT: 12 IU/L (ref 0–32)
AST: 11 IU/L (ref 0–40)
Albumin: 4.3 g/dL (ref 3.6–4.8)
BILIRUBIN TOTAL: 0.3 mg/dL (ref 0.0–1.2)
BUN/Creatinine Ratio: 18 (ref 12–28)
BUN: 15 mg/dL (ref 8–27)
CO2: 28 mmol/L (ref 18–29)
Calcium: 10.1 mg/dL (ref 8.7–10.3)
Chloride: 100 mmol/L (ref 96–106)
Creatinine, Ser: 0.83 mg/dL (ref 0.57–1.00)
GFR calc Af Amer: 88 mL/min/{1.73_m2} (ref >59)
GFR calc non Af Amer: 76 mL/min/{1.73_m2} (ref >59)
GLOBULIN, TOTAL: 2.7 g/dL (ref 1.5–4.5)
Glucose: 92 mg/dL (ref 65–99)
POTASSIUM: 4.4 mmol/L (ref 3.5–5.2)
SODIUM: 146 mmol/L — AB (ref 134–144)
Total Protein: 7 g/dL (ref 6.0–8.5)

## 2016-10-13 LAB — LIPID PANEL W/O CHOL/HDL RATIO
CHOLESTEROL TOTAL: 249 mg/dL — AB (ref 100–199)
HDL: 59 mg/dL (ref >39)
LDL Calculated: 169 mg/dL — ABNORMAL HIGH (ref 0–99)
TRIGLYCERIDES: 105 mg/dL (ref 0–149)
VLDL Cholesterol Cal: 21 mg/dL (ref 5–40)

## 2016-11-10 ENCOUNTER — Encounter: Payer: Self-pay | Admitting: Family Medicine

## 2016-11-10 ENCOUNTER — Ambulatory Visit (INDEPENDENT_AMBULATORY_CARE_PROVIDER_SITE_OTHER): Payer: BLUE CROSS/BLUE SHIELD | Admitting: Family Medicine

## 2016-11-10 VITALS — BP 136/81 | HR 84 | Temp 98.0°F | Wt 295.0 lb

## 2016-11-10 DIAGNOSIS — I10 Essential (primary) hypertension: Secondary | ICD-10-CM | POA: Diagnosis not present

## 2016-11-10 LAB — MICROALBUMIN, URINE WAIVED
CREATININE, URINE WAIVED: 50 mg/dL (ref 10–300)
Microalb, Ur Waived: 10 mg/L (ref 0–19)
Microalb/Creat Ratio: <30 mg/g (ref <30)

## 2016-11-10 LAB — UA/M W/RFLX CULTURE, ROUTINE
Bilirubin, UA: NEGATIVE
Glucose, UA: NEGATIVE
Ketones, UA: NEGATIVE
Leukocytes, UA: NEGATIVE
NITRITE UA: NEGATIVE
Protein, UA: NEGATIVE
RBC, UA: NEGATIVE
Specific Gravity, UA: 1.015 (ref 1.005–1.030)
UUROB: 0.2 mg/dL (ref 0.2–1.0)
pH, UA: 7 (ref 5.0–7.5)

## 2016-11-10 NOTE — Progress Notes (Signed)
BP 136/81 (BP Location: Left Arm, Patient Position: Sitting, Cuff Size: Large)   Pulse 84   Temp 98 F (36.7 C)   Wt 295 lb (133.8 kg)   LMP 03/17/2009 (Approximate)   SpO2 96%   BMI 54.31 kg/m    Subjective:    Patient ID: Wendy Marsh, female    DOB: 03-12-1956, 61 y.o.   MRN: [id number]  HPI: Wendy Marsh is a 61 y.o. female  Chief Complaint  Patient presents with  . Hypertension    1 month f/up   HYPERTENSION Hypertension status: controlled  Satisfied with current treatment? yes Duration of hypertension: chronic BP monitoring frequency:  not checking BP range:  BP medication side effects:  no Medication compliance: excellent compliance Previous BP meds: Aspirin: yes Recurrent headaches: no Visual changes: no Palpitations: no Dyspnea: no Chest pain: no Lower extremity edema: no Dizzy/lightheaded: no   Relevant past medical, surgical, family and social history reviewed and updated as indicated. Interim medical history since our last visit reviewed. Allergies and medications reviewed and updated.  Review of Systems  Constitutional: Negative.   Respiratory: Negative.   Cardiovascular: Negative.   Psychiatric/Behavioral: Negative.     Per HPI unless specifically indicated above     Objective:    BP 136/81 (BP Location: Left Arm, Patient Position: Sitting, Cuff Size: Large)   Pulse 84   Temp 98 F (36.7 C)   Wt 295 lb (133.8 kg)   LMP 03/17/2009 (Approximate)   SpO2 96%   BMI 54.31 kg/m   Wt Readings from Last 3 Encounters:  11/10/16 295 lb (133.8 kg)  10/12/16 292 lb (132.5 kg)  04/11/16 286 lb (129.7 kg)    Physical Exam  Constitutional: She is oriented to person, place, and time. She appears well-developed and well-nourished. No distress.  HENT:  Head: Normocephalic and atraumatic.  Right Ear: Hearing normal.  Left Ear: Hearing normal.  Nose: Nose normal.  Eyes: Conjunctivae and lids are normal. Right eye exhibits no  discharge. Left eye exhibits no discharge. No scleral icterus.  Cardiovascular: Normal rate, normal heart sounds and intact distal pulses.  An irregular rhythm present. Exam reveals no gallop and no friction rub.   No murmur heard. Pulmonary/Chest: Effort normal and breath sounds normal. No respiratory distress. She has no wheezes. She has no rales. She exhibits no tenderness.  Musculoskeletal: Normal range of motion.  Neurological: She is alert and oriented to person, place, and time.  Skin: Skin is warm, dry and intact. No rash noted. She is not diaphoretic. No erythema. No pallor.  Psychiatric: She has a normal mood and affect. Her speech is normal and behavior is normal. Judgment and thought content normal. Cognition and memory are normal.  Nursing note and vitals reviewed.   Results for orders placed or performed in visit on 10/12/16  Comprehensive metabolic panel  Result Value Ref Range   Glucose 92 65 - 99 mg/dL   BUN 15 8 - 27 mg/dL   Creatinine, Ser 0.83 0.57 - 1.00 mg/dL   GFR calc non Af Amer 76 >59 mL/min/1.73   GFR calc Af Amer 88 >59 mL/min/1.73   BUN/Creatinine Ratio 18 12 - 28   Sodium 146 (H) 134 - 144 mmol/L   Potassium 4.4 3.5 - 5.2 mmol/L   Chloride 100 96 - 106 mmol/L   CO2 28 18 - 29 mmol/L   Calcium 10.1 8.7 - 10.3 mg/dL   Total Protein 7.0 6.0 - 8.5 g/dL  Albumin 4.3 3.6 - 4.8 g/dL   Globulin, Total 2.7 1.5 - 4.5 g/dL   Albumin/Globulin Ratio 1.6 1.2 - 2.2   Bilirubin Total 0.3 0.0 - 1.2 mg/dL   Alkaline Phosphatase 83 39 - 117 IU/L   AST 11 0 - 40 IU/L   ALT 12 0 - 32 IU/L  Lipid Panel w/o Chol/HDL Ratio  Result Value Ref Range   Cholesterol, Total 249 (H) 100 - 199 mg/dL   Triglycerides 105 0 - 149 mg/dL   HDL 59 >39 mg/dL   VLDL Cholesterol Cal 21 5 - 40 mg/dL   LDL Calculated 169 (H) 0 - 99 mg/dL      Assessment & Plan:   Problem List Items Addressed This Visit      Cardiovascular and Mediastinum   Hypertension - Primary    Under good  control. Continue current regimen. Continue to monitor. Recheck 6 months.       Relevant Orders   Microalbumin, Urine Waived   UA/M w/rflx Culture, Routine   Basic metabolic panel       Follow up plan: Return in about 6 months (around 05/10/2017).

## 2016-11-10 NOTE — Assessment & Plan Note (Signed)
Under good control. Continue current regimen. Continue to monitor. Recheck 6 months.  

## 2016-11-11 LAB — BASIC METABOLIC PANEL
BUN/Creatinine Ratio: 17 (ref 12–28)
BUN: 13 mg/dL (ref 8–27)
CALCIUM: 9.4 mg/dL (ref 8.7–10.3)
CHLORIDE: 96 mmol/L (ref 96–106)
CO2: 28 mmol/L (ref 18–29)
Creatinine, Ser: 0.77 mg/dL (ref 0.57–1.00)
GFR calc Af Amer: 96 mL/min/{1.73_m2} (ref >59)
GFR, EST NON AFRICAN AMERICAN: 84 mL/min/{1.73_m2} (ref >59)
Glucose: 92 mg/dL (ref 65–99)
POTASSIUM: 3.9 mmol/L (ref 3.5–5.2)
Sodium: 141 mmol/L (ref 134–144)

## 2017-04-24 ENCOUNTER — Other Ambulatory Visit: Payer: Self-pay | Admitting: Family Medicine

## 2017-04-24 NOTE — Telephone Encounter (Signed)
Routing to provider. Next appt on 05/10/17.

## 2017-05-10 ENCOUNTER — Ambulatory Visit (INDEPENDENT_AMBULATORY_CARE_PROVIDER_SITE_OTHER): Payer: BLUE CROSS/BLUE SHIELD | Admitting: Family Medicine

## 2017-05-10 ENCOUNTER — Encounter: Payer: Self-pay | Admitting: Family Medicine

## 2017-05-10 VITALS — BP 148/80 | HR 80 | Ht 63.5 in | Wt 302.0 lb

## 2017-05-10 DIAGNOSIS — E039 Hypothyroidism, unspecified: Secondary | ICD-10-CM

## 2017-05-10 DIAGNOSIS — Z1231 Encounter for screening mammogram for malignant neoplasm of breast: Secondary | ICD-10-CM

## 2017-05-10 DIAGNOSIS — Z1211 Encounter for screening for malignant neoplasm of colon: Secondary | ICD-10-CM

## 2017-05-10 DIAGNOSIS — I10 Essential (primary) hypertension: Secondary | ICD-10-CM

## 2017-05-10 DIAGNOSIS — E782 Mixed hyperlipidemia: Secondary | ICD-10-CM | POA: Diagnosis not present

## 2017-05-10 DIAGNOSIS — Z1239 Encounter for other screening for malignant neoplasm of breast: Secondary | ICD-10-CM

## 2017-05-10 LAB — MICROALBUMIN, URINE WAIVED
CREATININE, URINE WAIVED: 50 mg/dL (ref 10–300)
Microalb, Ur Waived: 10 mg/L (ref 0–19)

## 2017-05-10 MED ORDER — METOPROLOL SUCCINATE ER 50 MG PO TB24: 100.0000 mg | ORAL_TABLET | Freq: Every day | ORAL | 3 refills | Status: DC

## 2017-05-10 MED ORDER — LISINOPRIL-HYDROCHLOROTHIAZIDE 20-25 MG PO TABS: 2.0000 | ORAL_TABLET | Freq: Every day | ORAL | 3 refills | Status: DC

## 2017-05-10 MED ORDER — METOPROLOL SUCCINATE ER 50 MG PO TB24: 50.0000 mg | ORAL_TABLET | Freq: Every day | ORAL | 3 refills | Status: DC

## 2017-05-10 NOTE — Progress Notes (Deleted)
LMP 03/17/2009 (Approximate)    Subjective:    Patient ID: Wendy Marsh, female    DOB: June 23, 1956, 61 y.o.   MRN: [id number]  HPI: Wendy Marsh is a 61 y.o. female presenting on 05/10/2017 for comprehensive medical examination. Current medical complaints include:  HYPERTENSION / HYPERLIPIDEMIA Satisfied with current treatment? {Blank single:19197::"yes","no"} Duration of hypertension: {Blank single:19197::"chronic","months","years"} BP monitoring frequency: {Blank single:19197::"not checking","rarely","daily","weekly","monthly","a few times a day","a few times a week","a few times a month"} BP range:  BP medication side effects: {Blank single:19197::"yes","no"} Past BP meds: {Blank LSLHTDSK:87681::"LXBW","IOMBTDHRCB","ULAGTXMIWO/EHOZYYQMGN","OIBBCWUG","QBVQXIHWTU","UEKCMKLKJZ/PHXT","AVWPVXYIAX (bystolic)","carvedilol","chlorthalidone","clonidine","diltiazem","exforge HCT","HCTZ","irbesartan (avapro)","labetalol","lisinopril","lisinopril-HCTZ","losartan (cozaar)","methyldopa","nifedipine","olmesartan (benicar)","olmesartan-HCTZ","quinapril","ramipril","spironalactone","tekturna","valsartan","valsartan-HCTZ","verapamil"} Duration of hyperlipidemia: {Blank single:19197::"chronic","months","years"} Cholesterol medication side effects: {Blank single:19197::"yes","no"} Cholesterol supplements: {Blank multiple:19196::"none","fish oil","niacin","red yeast rice"} Past cholesterol medications: {Blank multiple:19196::"none","atorvastain (lipitor)","lovastatin (mevacor)","pravastatin (pravachol)","rosuvastatin (crestor)","simvastatin (zocor)","vytorin","fenofibrate (tricor)","gemfibrozil","ezetimide (zetia)","niaspan","lovaza"} Medication compliance: {Blank single:19197::"excellent compliance","good compliance","fair compliance","poor compliance"} Aspirin: {Blank single:19197::"yes","no"} Recent stressors: {Blank single:19197::"yes","no"} Recurrent headaches: {Blank  single:19197::"yes","no"} Visual changes: {Blank single:19197::"yes","no"} Palpitations: {Blank single:19197::"yes","no"} Dyspnea: {Blank single:19197::"yes","no"} Chest pain: {Blank single:19197::"yes","no"} Lower extremity edema: {Blank single:19197::"yes","no"} Dizzy/lightheaded: {Blank single:19197::"yes","no"}  HYPOTHYROIDISM Thyroid control status:{Blank single:19197::"controlled","uncontrolled","better","worse","exacerbated","stable"} Satisfied with current treatment? {Blank single:19197::"yes","no"} Medication side effects: {Blank single:19197::"yes","no"} Medication compliance: {Blank single:19197::"excellent compliance","good compliance","fair compliance","poor compliance"} Etiology of hypothyroidism:  Recent dose adjustment:{Blank single:19197::"yes","no"} Fatigue: {Blank single:19197::"yes","no"} Cold intolerance: {Blank single:19197::"yes","no"} Heat intolerance: {Blank single:19197::"yes","no"} Weight gain: {Blank single:19197::"yes","no"} Weight loss: {Blank single:19197::"yes","no"} Constipation: {Blank single:19197::"yes","no"} Diarrhea/loose stools: {Blank single:19197::"yes","no"} Palpitations: {Blank single:19197::"yes","no"} Lower extremity edema: {Blank single:19197::"yes","no"} Anxiety/depressed mood: {Blank single:19197::"yes","no"}   She currently lives with: Menopausal Symptoms: {Blank single:19197::"yes","no"}  Depression Screen done today and results listed below:  Depression screen [facility] 2/9 02/26/2016  Decreased Interest 0  Down, Depressed, Hopeless 0  PHQ - 2 Score 0   Past Medical History:  Past Medical History:  Diagnosis Date  . Asthma   . Atrial fibrillation ([place])   . Hyperlipidemia   . Hypertension   . Lumbago   . Osteoporosis   . Thyroid disease     Surgical History:  Past Surgical History:  Procedure Laterality Date  . REPLACEMENT TOTAL KNEE      Medications:  Current Outpatient Prescriptions on File Prior to Visit  Medication  Sig  . aspirin 81 MG tablet Take 81 mg by mouth daily.  Marland Kitchen FLECAINIDE ACETATE PO Take 50 mg by mouth.  . levothyroxine (SYNTHROID, LEVOTHROID) 175 MCG tablet Take 1 tablet (175 mcg total) by mouth daily before breakfast.  . lisinopril-hydrochlorothiazide (PRINZIDE,ZESTORETIC) 20-25 MG tablet TAKE TWO (2) TABLETS BY MOUTH DAILY  . metoprolol succinate (TOPROL-XL) 50 MG 24 hr tablet TAKE ONE TABLET BY MOUTH DAILY. TAKE WITH OR IMMEDIATELY FOLLOWING A MEAL.  Marland Kitchen Nutritional Supplements (JUICE PLUS FIBRE) LIQD Take by mouth.   No current facility-administered medications on file prior to visit.     Allergies:  No Known Allergies  Social History:  Social History   Social History  . Marital status: Married    Spouse name: N/A  . Number of children: N/A  . Years of education: N/A   Occupational History  . Not on file.   Social History Main Topics  . Smoking status: Never Smoker  . Smokeless tobacco: Never Used  . Alcohol use No  . Drug use: No  . Sexual activity: Yes    Birth control/ protection: Post-menopausal   Other Topics Concern  . Not on file   Social History Narrative  . No narrative on file   History  Smoking Status  . Never Smoker  Smokeless Tobacco  . Never Used   History  Alcohol Use No  Family History:  Family History  Problem Relation Age of Onset  . Diabetes Mother   . Cancer Father        ESOPHAGEAL  . Heart disease Father   . Cancer Sister        BREAST AND ESOPHAGEAL  . Diabetes Sister   . Hypertension Sister   . Kidney disease Sister   . Thyroid disease Sister     Past medical history, surgical history, medications, allergies, family history and social history reviewed with patient today and changes made to appropriate areas of the chart.   ROS  All other ROS negative except what is listed above and in the HPI.      Objective:    LMP 03/17/2009 (Approximate)   Wt Readings from Last 3 Encounters:  11/10/16 295 lb (133.8 kg)   10/12/16 292 lb (132.5 kg)  04/11/16 286 lb (129.7 kg)    Physical Exam  Results for orders placed or performed in visit on 11/10/16  UA/M w/rflx Culture, Routine  Result Value Ref Range   Specific Gravity, UA 1.015 1.005 - 1.030   pH, UA 7.0 5.0 - 7.5   Color, UA Yellow Yellow   Appearance Ur Clear Clear   Leukocytes, UA Negative Negative   Protein, UA Negative Negative/Trace   Glucose, UA Negative Negative   Ketones, UA Negative Negative   RBC, UA Negative Negative   Bilirubin, UA Negative Negative   Urobilinogen, Ur 0.2 0.2 - 1.0 mg/dL   Nitrite, UA Negative Negative  Basic metabolic panel  Result Value Ref Range   Glucose 92 65 - 99 mg/dL   BUN 13 8 - 27 mg/dL   Creatinine, Ser 0.77 0.57 - 1.00 mg/dL   GFR calc non Af Amer 84 >59 mL/min/1.73   GFR calc Af Amer 96 >59 mL/min/1.73   BUN/Creatinine Ratio 17 12 - 28   Sodium 141 134 - 144 mmol/L   Potassium 3.9 3.5 - 5.2 mmol/L   Chloride 96 96 - 106 mmol/L   CO2 28 18 - 29 mmol/L   Calcium 9.4 8.7 - 10.3 mg/dL      Assessment & Plan:   Problem List Items Addressed This Visit    None       Follow up plan: No Follow-up on file.   LABORATORY TESTING:  - Pap smear: {Blank QBHALP:37902::"IOX done","not applicable","up to date","done elsewhere"}  IMMUNIZATIONS:   - Tdap: Tetanus vaccination status reviewed: last tetanus booster within 10 years. - Influenza: Postponed to flu season - Pneumovax: Not applicable - Prevnar: Not applicable - Zostavax vaccine: Will get at the pharmacy  SCREENING: -Mammogram: {Blank single:19197::"Up to date","Ordered today","Not applicable","Refused","Done elsewhere"}  - Colonoscopy: {Blank single:19197::"Up to date","Ordered today","Not applicable","Refused","Done elsewhere"}  - Bone Density: Not applicable   PATIENT COUNSELING:   Advised to take 1 mg of folate supplement per day if capable of pregnancy.   Sexuality: Discussed sexually transmitted diseases, partner selection,  use of condoms, avoidance of unintended pregnancy  and contraceptive alternatives.   Advised to avoid cigarette smoking.  I discussed with the patient that most people either abstain from alcohol or drink within safe limits (<=14/week and <=4 drinks/occasion for males, <=7/weeks and <= 3 drinks/occasion for females) and that the risk for alcohol disorders and other health effects rises proportionally with the number of drinks per week and how often a drinker exceeds daily limits.  Discussed cessation/primary prevention of drug use and availability of treatment for abuse.   Diet: Encouraged to adjust caloric  intake to maintain  or achieve ideal body weight, to reduce intake of dietary saturated fat and total fat, to limit sodium intake by avoiding high sodium foods and not adding table salt, and to maintain adequate dietary potassium and calcium preferably from fresh fruits, vegetables, and low-fat dairy products.    stressed the importance of regular exercise  Injury prevention: Discussed safety belts, safety helmets, smoke detector, smoking near bedding or upholstery.   Dental health: Discussed importance of regular tooth brushing, flossing, and dental visits.    NEXT PREVENTATIVE PHYSICAL DUE IN 1 YEAR. No Follow-up on file.

## 2017-05-10 NOTE — Progress Notes (Signed)
BP (!) 148/80   Pulse 80   Ht 5' 3.5" (1.613 m)   Wt (!) 302 lb (137 kg)   LMP 03/17/2009 (Approximate)   SpO2 94%   BMI 52.66 kg/m    Subjective:    Patient ID: Wendy Marsh, female    DOB: Feb 01, 1956, 61 y.o.   MRN: [id number]  HPI: Wendy Marsh is a 61 y.o. female  Chief Complaint  Patient presents with  . mEDICATION CONCERN    Pt w/ concerns over Lisinopril-HCTZ that she saw online  . Hypertension  . Hyperlipidemia   HYPERTENSION / HYPERLIPIDEMIA Satisfied with current treatment? yes Duration of hypertension: chronic BP monitoring frequency: not checking BP medication side effects: no Past BP meds: lisinopril-HCTZ Duration of hyperlipidemia: chronic Cholesterol medication side effects: no Cholesterol supplements: none Past cholesterol medications: none Medication compliance: excellent compliance Aspirin: yes Recent stressors: no Recurrent headaches: no Visual changes: no Palpitations: no Dyspnea: no Chest pain: no Lower extremity edema: yes Dizzy/lightheaded: no  HYPOTHYROIDISM Thyroid control status:controlled Satisfied with current treatment? yes Medication side effects: no Medication compliance: excellent compliance Recent dose adjustment:no Fatigue: yes Cold intolerance: no Heat intolerance: no Weight gain: yes Weight loss: no Constipation: no Diarrhea/loose stools: no Palpitations: no Lower extremity edema: yes Anxiety/depressed mood: no  Relevant past medical, surgical, family and social history reviewed and updated as indicated. Interim medical history since our last visit reviewed. Allergies and medications reviewed and updated.  Review of Systems  Constitutional: Negative.   Respiratory: Positive for shortness of breath. Negative for apnea, cough, choking, chest tightness, wheezing and stridor.   Cardiovascular: Positive for leg swelling. Negative for chest pain and palpitations.  Musculoskeletal: Positive for joint  swelling. Negative for arthralgias, back pain, gait problem, myalgias, neck pain and neck stiffness.  Psychiatric/Behavioral: Negative.     Per HPI unless specifically indicated above     Objective:    BP (!) 148/80   Pulse 80   Ht 5' 3.5" (1.613 m)   Wt (!) 302 lb (137 kg)   LMP 03/17/2009 (Approximate)   SpO2 94%   BMI 52.66 kg/m   Wt Readings from Last 3 Encounters:  05/10/17 (!) 302 lb (137 kg)  11/10/16 295 lb (133.8 kg)  10/12/16 292 lb (132.5 kg)    Physical Exam  Constitutional: She is oriented to person, place, and time. She appears well-developed and well-nourished. No distress.  HENT:  Head: Normocephalic and atraumatic.  Right Ear: Hearing normal.  Left Ear: Hearing normal.  Nose: Nose normal.  Eyes: Conjunctivae and lids are normal. Right eye exhibits no discharge. Left eye exhibits no discharge. No scleral icterus.  Cardiovascular: Normal rate, regular rhythm, normal heart sounds and intact distal pulses.  Exam reveals no gallop and no friction rub.   No murmur heard. Pulmonary/Chest: Effort normal and breath sounds normal. No respiratory distress. She has no wheezes. She has no rales. She exhibits no tenderness.  Musculoskeletal: Normal range of motion.  Neurological: She is alert and oriented to person, place, and time.  Skin: Skin is warm, dry and intact. No rash noted. She is not diaphoretic. No erythema. No pallor.  Psychiatric: She has a normal mood and affect. Her speech is normal and behavior is normal. Judgment and thought content normal. Cognition and memory are normal.  Nursing note and vitals reviewed.   Results for orders placed or performed in visit on 11/10/16  UA/M w/rflx Culture, Routine  Result Value Ref Range   Specific Gravity, UA  1.015 1.005 - 1.030   pH, UA 7.0 5.0 - 7.5   Color, UA Yellow Yellow   Appearance Ur Clear Clear   Leukocytes, UA Negative Negative   Protein, UA Negative Negative/Trace   Glucose, UA Negative Negative    Ketones, UA Negative Negative   RBC, UA Negative Negative   Bilirubin, UA Negative Negative   Urobilinogen, Ur 0.2 0.2 - 1.0 mg/dL   Nitrite, UA Negative Negative  Basic metabolic panel  Result Value Ref Range   Glucose 92 65 - 99 mg/dL   BUN 13 8 - 27 mg/dL   Creatinine, Ser 0.77 0.57 - 1.00 mg/dL   GFR calc non Af Amer 84 >59 mL/min/1.73   GFR calc Af Amer 96 >59 mL/min/1.73   BUN/Creatinine Ratio 17 12 - 28   Sodium 141 134 - 144 mmol/L   Potassium 3.9 3.5 - 5.2 mmol/L   Chloride 96 96 - 106 mmol/L   CO2 28 18 - 29 mmol/L   Calcium 9.4 8.7 - 10.3 mg/dL      Assessment & Plan:   Problem List Items Addressed This Visit      Cardiovascular and Mediastinum   Hypertension - Primary    Not under great control. Will increase metoprolol to 100mg  and recheck 3 months.       Relevant Medications   lisinopril-hydrochlorothiazide (PRINZIDE,ZESTORETIC) 20-25 MG tablet   metoprolol succinate (TOPROL-XL) 50 MG 24 hr tablet   Other Relevant Orders   Comprehensive metabolic panel   Microalbumin, Urine Waived     Endocrine   Thyroid activity decreased    Feeling well. Rechecking levels today. Await results.       Relevant Medications   metoprolol succinate (TOPROL-XL) 50 MG 24 hr tablet   Other Relevant Orders   Comprehensive metabolic panel   TSH     Other   Combined fat and carbohydrate induced hyperlipemia    Rechecking levels today. Await results. Call with any concerns.       Relevant Medications   lisinopril-hydrochlorothiazide (PRINZIDE,ZESTORETIC) 20-25 MG tablet   metoprolol succinate (TOPROL-XL) 50 MG 24 hr tablet   Other Relevant Orders   Comprehensive metabolic panel   Lipid Panel w/o Chol/HDL Ratio    Other Visit Diagnoses    Breast cancer screening       Mammogram ordered today.   Relevant Orders   MM DIGITAL SCREENING BILATERAL   Colon cancer screening       Cologuard ordered today.   Relevant Orders   Cologuard       Follow up plan: Return in  about 6 months (around 11/10/2017) for Physical.

## 2017-05-10 NOTE — Assessment & Plan Note (Signed)
Rechecking levels today. Await results. Call with any concerns.  

## 2017-05-10 NOTE — Assessment & Plan Note (Signed)
Not under great control. Will increase metoprolol to 100mg  and recheck 3 months.

## 2017-05-10 NOTE — Assessment & Plan Note (Signed)
Feeling well. Rechecking levels today. Await results.

## 2017-05-11 LAB — LIPID PANEL W/O CHOL/HDL RATIO
CHOLESTEROL TOTAL: 219 mg/dL — AB (ref 100–199)
HDL: 52 mg/dL (ref >39)
LDL CALC: 145 mg/dL — AB (ref 0–99)
TRIGLYCERIDES: 109 mg/dL (ref 0–149)
VLDL Cholesterol Cal: 22 mg/dL (ref 5–40)

## 2017-05-11 LAB — COMPREHENSIVE METABOLIC PANEL
ALT: 12 IU/L (ref 0–32)
AST: 12 IU/L (ref 0–40)
Albumin/Globulin Ratio: 1.3 (ref 1.2–2.2)
Albumin: 3.9 g/dL (ref 3.6–4.8)
Alkaline Phosphatase: 80 IU/L (ref 39–117)
BILIRUBIN TOTAL: 0.3 mg/dL (ref 0.0–1.2)
BUN / CREAT RATIO: 13 (ref 12–28)
BUN: 12 mg/dL (ref 8–27)
CHLORIDE: 99 mmol/L (ref 96–106)
CO2: 28 mmol/L (ref 20–29)
Calcium: 9.6 mg/dL (ref 8.7–10.3)
Creatinine, Ser: 0.9 mg/dL (ref 0.57–1.00)
GFR calc Af Amer: 79 mL/min/{1.73_m2} (ref >59)
GFR calc non Af Amer: 69 mL/min/{1.73_m2} (ref >59)
GLUCOSE: 85 mg/dL (ref 65–99)
Globulin, Total: 3.1 g/dL (ref 1.5–4.5)
POTASSIUM: 4.3 mmol/L (ref 3.5–5.2)
SODIUM: 144 mmol/L (ref 134–144)
TOTAL PROTEIN: 7 g/dL (ref 6.0–8.5)

## 2017-05-11 LAB — TSH: TSH: 2.44 u[IU]/mL (ref 0.450–4.500)

## 2018-02-06 ENCOUNTER — Encounter: Payer: Self-pay | Admitting: Family Medicine

## 2018-02-06 ENCOUNTER — Ambulatory Visit (INDEPENDENT_AMBULATORY_CARE_PROVIDER_SITE_OTHER): Payer: BLUE CROSS/BLUE SHIELD | Admitting: Family Medicine

## 2018-02-06 VITALS — BP 146/82 | HR 72 | Temp 97.9°F | Wt 311.3 lb

## 2018-02-06 DIAGNOSIS — Z1239 Encounter for other screening for malignant neoplasm of breast: Secondary | ICD-10-CM

## 2018-02-06 DIAGNOSIS — I10 Essential (primary) hypertension: Secondary | ICD-10-CM

## 2018-02-06 DIAGNOSIS — Z1211 Encounter for screening for malignant neoplasm of colon: Secondary | ICD-10-CM | POA: Diagnosis not present

## 2018-02-06 DIAGNOSIS — Z91010 Allergy to peanuts: Secondary | ICD-10-CM | POA: Diagnosis not present

## 2018-02-06 DIAGNOSIS — Z1231 Encounter for screening mammogram for malignant neoplasm of breast: Secondary | ICD-10-CM | POA: Diagnosis not present

## 2018-02-06 MED ORDER — EPINEPHRINE 0.3 MG/0.3ML IJ SOAJ: 0.3000 mg | Freq: Once | INTRAMUSCULAR | 12 refills | Status: AC

## 2018-02-06 NOTE — Patient Instructions (Signed)
Anaphylactic Reaction, Adult An anaphylactic reaction (anaphylaxis) is a sudden, severe allergic reaction that affects multiple areas of the body. Affected areas of the body may include the skin, mouth, lungs, heart, or gut (digestive system). Anaphylaxis can be life-threatening. This condition requires immediate medical attention, and sometimes hospitalization. What are the causes? This condition is caused by exposure to a substance that you are allergic to (allergen). In response to this exposure, the body releases proteins (antibodies) and other compounds, such as histamine, into the bloodstream. This causes swelling in certain tissues and loss of blood pressure to important areas, such as the heart and lungs. Common allergens that can cause anaphylaxis include:  Medicines.  Foods, especially peanuts, wheat, shellfish, milk, and eggs.  Insect bites or stings.  Blood or parts of blood, including plasma, immunoglobulins, or serum.  Chemicals, such as dyes, latex, and contrast material that is used for medical tests.  What increases the risk? This condition is more likely to occur in people who:  Have allergies.  Have had anaphylaxis before.  Have a family history of anaphylaxis.  Have certain medical conditions, including asthma and eczema.  What are the signs or symptoms? Symptoms of anaphylaxis include:  Nasal congestion.  Headache.  Flushed face.  Tingling in the mouth.  An itchy, red rash.  Swelling of the eyes, lips, face, or tongue.  Swelling of the back of the mouth and the throat.  Wheezing.  A hoarse voice.  Itchy, red, swollen areas of skin (hives).  Dizziness or light-headedness.  Fainting.  Anxiety or confusion.  Abdominal or chest pain.  Difficulty breathing, speaking, or swallowing.  Chest or throat tightness.  Fast or irregular heartbeats (palpitations).  Vomiting.  Diarrhea.  How is this diagnosed? This condition is diagnosed based  on a physical exam and your history of recent exposure to allergens. You may be referred for follow-up testing by a health care provider who specializes in allergies. This testing can confirm the diagnosis and determine which substances you are allergic to. Testing may include:  Skin tests. These may involve: ? Injecting a small amount of the possible allergen between layers of your skin (intradermal injection). ? Applying patches to your skin.  Blood tests.  How is this treated? Emergency treatment may include:  Medicines that help: ? To relieve itching and hives (antihistamines). ? To reduce swelling (corticosteroids). ? To tighten your blood vessels and increase your heart rate (epinephrine).  Oxygen therapy to help you breathe.  Giving fluids through an IV tube.  Your health care provider may teach you how to use an anaphylaxis kit and how to give yourself an epinephrine injection with what is commonly called an auto-injector "pen" (pre-filled automatic epinephrine injection device). If you think that you are having an anaphylactic reaction, you should use an auto-injector pen or an anaphylaxis kit. If you use epinephrine, you must still seek emergency medical treatment. Follow these instructions at home: Safety  Always keep an auto-injector pen or an anaphylaxis kit near you. These can be lifesaving if you have a severe anaphylactic reaction. Use your auto-injector pen or anaphylaxis kit as told by your health care provider.  Do not drive until your health care provider approves.  Make sure that you, the members of your household, and your employer know: ? How to use an anaphylaxis kit. ? How to use an auto-injector pen to give you an epinephrine injection.  Replace your epinephrine immediately after you use your auto-injector pen, in case you have  another reaction.  Wear a medical alert bracelet or necklace that states your allergy, if told by your health care  provider.  Learn the signs of anaphylaxis.  Work with your health care providers to come up with an anaphylaxis plan. Preparation is important. General instructions  Take over-the-counter and prescription medicines only as told by your health care provider.  If you have hives or a rash: ? Use an over-the-counter antihistamine as told by your health care provider. ? Apply cold, wet cloths (cold compresses) to your skin or take baths or showers in cool water. Avoid hot water.  If you had tests done, it is your responsibility to get your test results. Ask your health care provider or the department performing the tests when your results will be ready.  Tell any health care providers who care for you that you have an allergy.  Keep all follow-up visits as told by your health care provider. This is important. How is this prevented?  Avoid allergens that have caused an anaphylactic reaction for you before.  When you are at a restaurant, tell your server that you have an allergy. If you are unsure whether a meal has an ingredient that you are allergic to, ask your server. Contact a health care provider if:  You develop symptoms of an allergic reaction. You may notice them soon after you are exposed to a substance. The symptoms may include: ? Rash. ? Headache. ? Sneezing or a runny nose. ? Swelling. ? Nausea. ? Diarrhea. Get help right away if:  You needed to use epinephrine. ? An epinephrine injection helps to manage life-threatening allergic reactions, but you still need to go to the emergency room even if epinephrine seems to work. This is important because anaphylaxis may happen again within 72 hours (rebound anaphylaxis). ? If you used epinephrine to treat anaphylaxis outside of the hospital, you need additional medical care. This may include more doses of epinephrine.  You develop: ? A tight feeling in your chest or throat. ? Wheezing or difficulty breathing. ? Hives. ? Red  skin or itching all over your body. ? Swelling in your lips, tongue, or the back of your throat.  You have severe vomiting or diarrhea.  You faint or you feel like you are going to faint. These symptoms may represent a serious problem that is an emergency. Do not wait to see if the symptoms will go away. Use your auto-injector pen or anaphylaxis kit as you have been instructed, and get medical help right away. Call your local emergency services (911 in the [place] Do not drive yourself to the hospital. This information is not intended to replace advice given to you by your health care provider. Make sure you discuss any questions you have with your health care provider. Document Released: 09/19/2005 Document Revised: 05/17/2016 Document Reviewed: 04/04/2016 Elsevier Interactive Patient Education  2018 [place].  Epinephrine Injection What is epinephrine? Epinephrine is a medicine that is given as a shot (injection) to temporarily treat a life-threatening allergic reaction. It may also be used to treat severe asthma attacks, other lung problems, and other emergency conditions. Epinephrine works by relaxing the muscles in the airways and tightening the blood vessels. Epinephrine comes in many forms, including what is commonly called an auto-injector "pen" (pre-filled automatic epinephrine injection device). You may hear other names that mean the same thing, including epinephrine injection, epinephrine auto-injector pen, epinephrine pen, and automatic injection device. Why do I need epinephrine? You need epinephrine if you  experience a severe asthma attack or a life-threatening allergic reaction (anaphylaxis). This injection can be lifesaving. You should always carry an auto-injector pen with you if you are at risk for anaphylaxis. When should I use my auto-injector pen? Use your auto-injector pen as soon as you think you are experiencing anaphylaxis or a severe asthmatic attack, as told by your  health care provider. Anaphylaxis is very dangerous if it is not treated right away. Signs and symptoms of anaphylaxis may include:  Nasal congestion.  Tingling in the mouth.  An itchy, red rash.  Swelling of the eyes, lips, face, or tongue.  Swelling of the back of the mouth and the throat.  Wheezing.  A hoarse voice.  Itchy, red, swollen areas of skin (hives).  Dizziness or light-headedness.  Fainting.  Anxiety or confusion.  Abdominal pain.  Difficulty breathing, speaking, or swallowing.  Chest tightness.  Fast or irregular heartbeats (palpitations).  Vomiting.  Diarrhea.  These symptoms may represent a serious problem that is an emergency. Do not wait to see if the symptoms will go away. Use your auto-injector pen as you have been instructed, and get medical help right away. Call your local emergency services (911 in the [place] Do not drive yourself to the hospital. How do I use an auto-injector pen?  Use epinephrine exactly as told by your health care provider. Do not inject it more often or in greater or smaller doses than your health care provider prescribed. Most auto-injector pens contain one dose of epinephrine. Some contain two doses.  You may use your auto-injector pen to give an injection under your skin or into your muscle on the outer side of your thigh. Do not give yourself an injection into your buttocks or any other part of your body. ? In an emergency, you can use your auto-injector pen through your clothing. ? After you inject a dose of epinephrine, some liquid may remain in your auto-injector pen. This is normal.  If you need to give yourself a second dose of epinephrine, give the second injection in another location on your outer thigh. Do not give two injections in exactly the same place on your body. This can lead to tissue damage.  Talk with your pharmacist or health care provider if you have questions about how to inject epinephrine  correctly. When should I seek immediate medical care? Seek emergency medical treatment immediately after you inject epinephrine. You may need additional medical care, and you may be monitored for side effects of epinephrine, such as:  Difficulty breathing.  Fast or irregular heartbeat.  Nausea or vomiting.  Sweating.  Dizziness.  Nervousness or anxiety.  Weakness.  Pale skin.  Headache.  Uncontrollable shaking.  This information is not intended to replace advice given to you by your health care provider. Make sure you discuss any questions you have with your health care provider. Document Released: 09/16/2000 Document Revised: 09/02/2016 Document Reviewed: 04/08/2015 Elsevier Interactive Patient Education  2017 [place] DASH stands for "Dietary Approaches to Stop Hypertension." The DASH eating plan is a healthy eating plan that has been shown to reduce high blood pressure (hypertension). It may also reduce your risk for type 2 diabetes, heart disease, and stroke. The DASH eating plan may also help with weight loss. What are tips for following this plan? General guidelines  Avoid eating more than 2,300 mg (milligrams) of salt (sodium) a day. If you have hypertension, you may need to reduce your sodium intake to  1,500 mg a day.  Limit alcohol intake to no more than 1 drink a day for nonpregnant women and 2 drinks a day for men. One drink equals 12 oz of beer, 5 oz of wine, or 1 oz of hard liquor.  Work with your health care provider to maintain a healthy body weight or to lose weight. Ask what an ideal weight is for you.  Get at least 30 minutes of exercise that causes your heart to beat faster (aerobic exercise) most days of the week. Activities may include walking, swimming, or biking.  Work with your health care provider or diet and nutrition specialist (dietitian) to adjust your eating plan to your individual calorie needs. Reading food  labels  Check food labels for the amount of sodium per serving. Choose foods with less than 5 percent of the Daily Value of sodium. Generally, foods with less than 300 mg of sodium per serving fit into this eating plan.  To find whole grains, look for the word "whole" as the first word in the ingredient list. Shopping  Buy products labeled as "low-sodium" or "no salt added."  Buy fresh foods. Avoid canned foods and premade or frozen meals. Cooking  Avoid adding salt when cooking. Use salt-free seasonings or herbs instead of table salt or sea salt. Check with your health care provider or pharmacist before using salt substitutes.  Do not fry foods. Cook foods using healthy methods such as baking, boiling, grilling, and broiling instead.  Cook with heart-healthy oils, such as olive, canola, soybean, or sunflower oil. Meal planning   Eat a balanced diet that includes: ? 5 or more servings of fruits and vegetables each day. At each meal, try to fill half of your plate with fruits and vegetables. ? Up to 6-8 servings of whole grains each day. ? Less than 6 oz of lean meat, poultry, or fish each day. A 3-oz serving of meat is about the same size as a deck of cards. One egg equals 1 oz. ? 2 servings of low-fat dairy each day. ? A serving of nuts, seeds, or beans 5 times each week. ? Heart-healthy fats. Healthy fats called Omega-3 fatty acids are found in foods such as flaxseeds and coldwater fish, like sardines, salmon, and mackerel.  Limit how much you eat of the following: ? Canned or prepackaged foods. ? Food that is high in trans fat, such as fried foods. ? Food that is high in saturated fat, such as fatty meat. ? Sweets, desserts, sugary drinks, and other foods with added sugar. ? Full-fat dairy products.  Do not salt foods before eating.  Try to eat at least 2 vegetarian meals each week.  Eat more home-cooked food and less restaurant, buffet, and fast food.  When eating at a  restaurant, ask that your food be prepared with less salt or no salt, if possible. What foods are recommended? The items listed may not be a complete list. Talk with your dietitian about what dietary choices are best for you. Grains Whole-grain or whole-wheat bread. Whole-grain or whole-wheat pasta. Brown rice. [place]. Bulgur. Whole-grain and low-sodium cereals. Pita bread. Low-fat, low-sodium crackers. Whole-wheat flour tortillas. Vegetables Fresh or frozen vegetables (raw, steamed, roasted, or grilled). Low-sodium or reduced-sodium tomato and vegetable juice. Low-sodium or reduced-sodium tomato sauce and tomato paste. Low-sodium or reduced-sodium canned vegetables. Fruits All fresh, dried, or frozen fruit. Canned fruit in natural juice (without added sugar). Meat and other protein foods Skinless chicken or [place]. Ground chicken  or [place]. Pork with fat trimmed off. Fish and seafood. Egg whites. Dried beans, peas, or lentils. Unsalted nuts, nut butters, and seeds. Unsalted canned beans. Lean cuts of beef with fat trimmed off. Low-sodium, lean deli meat. Dairy Low-fat (1%) or fat-free (skim) milk. Fat-free, low-fat, or reduced-fat cheeses. Nonfat, low-sodium ricotta or cottage cheese. Low-fat or nonfat yogurt. Low-fat, low-sodium cheese. Fats and oils Soft margarine without trans fats. Vegetable oil. Low-fat, reduced-fat, or light mayonnaise and salad dressings (reduced-sodium). Canola, safflower, olive, soybean, and sunflower oils. Avocado. Seasoning and other foods Herbs. Spices. Seasoning mixes without salt. Unsalted popcorn and pretzels. Fat-free sweets. What foods are not recommended? The items listed may not be a complete list. Talk with your dietitian about what dietary choices are best for you. Grains Baked goods made with fat, such as croissants, muffins, or some breads. Dry pasta or rice meal packs. Vegetables Creamed or fried vegetables. Vegetables in a cheese sauce.  Regular canned vegetables (not low-sodium or reduced-sodium). Regular canned tomato sauce and paste (not low-sodium or reduced-sodium). Regular tomato and vegetable juice (not low-sodium or reduced-sodium). Angie Fava. Olives. Fruits Canned fruit in a light or heavy syrup. Fried fruit. Fruit in cream or butter sauce. Meat and other protein foods Fatty cuts of meat. Ribs. Fried meat. Berniece Salines. Sausage. Bologna and other processed lunch meats. Salami. Fatback. Hotdogs. Bratwurst. Salted nuts and seeds. Canned beans with added salt. Canned or smoked fish. Whole eggs or egg yolks. Chicken or [place] with skin. Dairy Whole or 2% milk, cream, and half-and-half. Whole or full-fat cream cheese. Whole-fat or sweetened yogurt. Full-fat cheese. Nondairy creamers. Whipped toppings. Processed cheese and cheese spreads. Fats and oils Butter. Stick margarine. Lard. Shortening. Ghee. Bacon fat. Tropical oils, such as coconut, palm kernel, or palm oil. Seasoning and other foods Salted popcorn and pretzels. Onion salt, garlic salt, seasoned salt, table salt, and sea salt. Worcestershire sauce. Tartar sauce. Barbecue sauce. Teriyaki sauce. Soy sauce, including reduced-sodium. Steak sauce. Canned and packaged gravies. Fish sauce. Oyster sauce. Cocktail sauce. Horseradish that you find on the shelf. Ketchup. Mustard. Meat flavorings and tenderizers. Bouillon cubes. Hot sauce and Tabasco sauce. Premade or packaged marinades. Premade or packaged taco seasonings. Relishes. Regular salad dressings. Where to find more information:  National Heart, Lung, and Blanket: https://wilson-eaton.com/  American Heart Association: www.heart.org Summary  The DASH eating plan is a healthy eating plan that has been shown to reduce high blood pressure (hypertension). It may also reduce your risk for type 2 diabetes, heart disease, and stroke.  With the DASH eating plan, you should limit salt (sodium) intake to 2,300 mg a day. If you have  hypertension, you may need to reduce your sodium intake to 1,500 mg a day.  When on the DASH eating plan, aim to eat more fresh fruits and vegetables, whole grains, lean proteins, low-fat dairy, and heart-healthy fats.  Work with your health care provider or diet and nutrition specialist (dietitian) to adjust your eating plan to your individual calorie needs. This information is not intended to replace advice given to you by your health care provider. Make sure you discuss any questions you have with your health care provider. Document Released: 09/08/2011 Document Revised: 09/12/2016 Document Reviewed: 09/12/2016 Elsevier Interactive Patient Education  [employer].

## 2018-02-06 NOTE — Assessment & Plan Note (Signed)
Will check RAST testing. AVOID PEANUTS. Epipen Rx given today. Call with any concerns. Continue to monitor.

## 2018-02-06 NOTE — Progress Notes (Signed)
BP (!) 146/82 (BP Location: Left Arm, Cuff Size: Large)   Pulse 72   Temp 97.9 F (36.6 C)   Wt (!) 311 lb 5 oz (141.2 kg)   LMP 03/17/2009 (Approximate)   SpO2 96%   BMI 54.28 kg/m    Subjective:    Patient ID: Wendy Marsh, female    DOB: Sep 01, 1956, 62 y.o.   MRN: [id number]  HPI: Wendy Marsh is a 62 y.o. female  Chief Complaint  Patient presents with  . Allergic Reaction    Patient states that for about the last 6 months she has noticed digestive issues after eating peanut butter, this last time she had the digestive issue then her throat started to swell.    She notes that for about the past 6 months she has been avoiding peanuts because it gives her GI upset. Last week was making peanut butter crackers and got a little bit on her finger and put it in her mouth and felt her throat closing up. Went away on it's own after about 15-20 minutes. Now feeling back to herself.   HYPERTENSION Hypertension status: uncontrolled  Satisfied with current treatment? yes Duration of hypertension: chronic BP monitoring frequency:  not checking BP range:  BP medication side effects:  no Medication compliance: excellent compliance Previous BP meds: metoprolol, lisinopril-hctz Aspirin: no Recurrent headaches: no Visual changes: no Palpitations: no Dyspnea: no Chest pain: no Lower extremity edema: no Dizzy/lightheaded: no   Relevant past medical, surgical, family and social history reviewed and updated as indicated. Interim medical history since our last visit reviewed. Allergies and medications reviewed and updated.  Review of Systems  Constitutional: Negative.   HENT: Negative.   Respiratory: Negative.   Cardiovascular: Negative.   Neurological: Negative.   Psychiatric/Behavioral: Negative.     Per HPI unless specifically indicated above     Objective:    BP (!) 146/82 (BP Location: Left Arm, Cuff Size: Large)   Pulse 72   Temp 97.9 F (36.6 C)   Wt  (!) 311 lb 5 oz (141.2 kg)   LMP 03/17/2009 (Approximate)   SpO2 96%   BMI 54.28 kg/m   Wt Readings from Last 3 Encounters:  02/06/18 (!) 311 lb 5 oz (141.2 kg)  05/10/17 (!) 302 lb (137 kg)  11/10/16 295 lb (133.8 kg)    Physical Exam  Constitutional: She is oriented to person, place, and time. She appears well-developed and well-nourished. No distress.  HENT:  Head: Normocephalic and atraumatic.  Right Ear: Hearing normal.  Left Ear: Hearing normal.  Nose: Nose normal.  Eyes: Conjunctivae and lids are normal. Right eye exhibits no discharge. Left eye exhibits no discharge. No scleral icterus.  Cardiovascular: Normal rate, regular rhythm, normal heart sounds and intact distal pulses. Exam reveals no gallop and no friction rub.  No murmur heard. Pulmonary/Chest: Effort normal and breath sounds normal. No stridor. No respiratory distress. She has no wheezes. She has no rales. She exhibits no tenderness.  Musculoskeletal: Normal range of motion.  Neurological: She is alert and oriented to person, place, and time.  Skin: Skin is warm, dry and intact. Capillary refill takes less than 2 seconds. No rash noted. She is not diaphoretic. No erythema. No pallor.  Psychiatric: She has a normal mood and affect. Her speech is normal and behavior is normal. Judgment and thought content normal. Cognition and memory are normal.  Nursing note and vitals reviewed.   Results for orders placed or performed in visit  on 05/10/17  Comprehensive metabolic panel  Result Value Ref Range   Glucose 85 65 - 99 mg/dL   BUN 12 8 - 27 mg/dL   Creatinine, Ser 0.90 0.57 - 1.00 mg/dL   GFR calc non Af Amer 69 >59 mL/min/1.73   GFR calc Af Amer 79 >59 mL/min/1.73   BUN/Creatinine Ratio 13 12 - 28   Sodium 144 134 - 144 mmol/L   Potassium 4.3 3.5 - 5.2 mmol/L   Chloride 99 96 - 106 mmol/L   CO2 28 20 - 29 mmol/L   Calcium 9.6 8.7 - 10.3 mg/dL   Total Protein 7.0 6.0 - 8.5 g/dL   Albumin 3.9 3.6 - 4.8 g/dL    Globulin, Total 3.1 1.5 - 4.5 g/dL   Albumin/Globulin Ratio 1.3 1.2 - 2.2   Bilirubin Total 0.3 0.0 - 1.2 mg/dL   Alkaline Phosphatase 80 39 - 117 IU/L   AST 12 0 - 40 IU/L   ALT 12 0 - 32 IU/L  Lipid Panel w/o Chol/HDL Ratio  Result Value Ref Range   Cholesterol, Total 219 (H) 100 - 199 mg/dL   Triglycerides 109 0 - 149 mg/dL   HDL 52 >39 mg/dL   VLDL Cholesterol Cal 22 5 - 40 mg/dL   LDL Calculated 145 (H) 0 - 99 mg/dL  Microalbumin, Urine Waived  Result Value Ref Range   Microalb, Ur Waived 10 0 - 19 mg/L   Creatinine, Urine Waived 50 10 - 300 mg/dL   Microalb/Creat Ratio <30 <30 mg/g  TSH  Result Value Ref Range   TSH 2.440 0.450 - 4.500 uIU/mL      Assessment & Plan:   Problem List Items Addressed This Visit      Cardiovascular and Mediastinum   Hypertension    Not under great control. Work on [employer]. Recheck at physical in 4-6 weeks.       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 AVOID PEANUTS. Epipen Rx given today. Call with any concerns. Continue to monitor.       Relevant Orders   Allergens(7)    Other Visit Diagnoses    Screening for breast cancer       Mammogram ordered today.   Screening for colon cancer       Referral to GI placed today.   Relevant Orders   Ambulatory referral to Gastroenterology       Follow up plan: Return 4-6 weeks, for physical.

## 2018-02-06 NOTE — Assessment & Plan Note (Signed)
Not under great control. Work on [employer]. Recheck at physical in 4-6 weeks.

## 2018-02-13 ENCOUNTER — Telehealth: Payer: Self-pay | Admitting: Family Medicine

## 2018-02-13 LAB — ALLERGENS(7)
F020-IgE Almond: <0.1 kU/L
PEANUT IGE: 0.23 kU/L — AB
Pecan Nut IgE: <0.1 kU/L
Walnut IgE: <0.1 kU/L

## 2018-02-13 NOTE — Telephone Encounter (Signed)
Patient notified of result. 

## 2018-02-13 NOTE — Telephone Encounter (Signed)
-----   Message from Jerene Pitch, [place] sent at 02/13/2018  8:57 AM EDT -----   ----- Message ----- From: Valerie Roys, DO Sent: 02/13/2018   8:35 AM To: Tiffany L Reel, CMA  Can we check on this? I know it was drawn ----- Message ----- From: SYSTEM Sent: 02/11/2018  12:06 AM To: Valerie Roys, DO

## 2018-02-13 NOTE — Telephone Encounter (Signed)
Please let her know that she IS allergic to peanuts. Thanks!

## 2018-02-13 NOTE — Telephone Encounter (Signed)
Results not back. Lab checked on status- marked as quantity not sufficient, 2 tubes were sent. Patient to come back in to be redrawn.

## 2018-02-13 NOTE — Telephone Encounter (Signed)
Results now back.

## 2018-03-06 ENCOUNTER — Ambulatory Visit (INDEPENDENT_AMBULATORY_CARE_PROVIDER_SITE_OTHER): Payer: BLUE CROSS/BLUE SHIELD | Admitting: Family Medicine

## 2018-03-06 ENCOUNTER — Encounter: Payer: Self-pay | Admitting: Family Medicine

## 2018-03-06 VITALS — BP 162/93 | HR 98 | Temp 98.2°F | Ht 61.4 in | Wt 310.5 lb

## 2018-03-06 DIAGNOSIS — I480 Paroxysmal atrial fibrillation: Secondary | ICD-10-CM

## 2018-03-06 DIAGNOSIS — Z0000 Encounter for general adult medical examination without abnormal findings: Secondary | ICD-10-CM

## 2018-03-06 DIAGNOSIS — I10 Essential (primary) hypertension: Secondary | ICD-10-CM

## 2018-03-06 DIAGNOSIS — E782 Mixed hyperlipidemia: Secondary | ICD-10-CM | POA: Diagnosis not present

## 2018-03-06 DIAGNOSIS — M1711 Unilateral primary osteoarthritis, right knee: Secondary | ICD-10-CM

## 2018-03-06 DIAGNOSIS — E039 Hypothyroidism, unspecified: Secondary | ICD-10-CM

## 2018-03-06 DIAGNOSIS — Z124 Encounter for screening for malignant neoplasm of cervix: Secondary | ICD-10-CM

## 2018-03-06 DIAGNOSIS — B379 Candidiasis, unspecified: Secondary | ICD-10-CM

## 2018-03-06 MED ORDER — LISINOPRIL-HYDROCHLOROTHIAZIDE 20-25 MG PO TABS: 2.0000 | ORAL_TABLET | Freq: Every day | ORAL | 3 refills | Status: DC

## 2018-03-06 MED ORDER — FLUCONAZOLE 150 MG PO TABS: 150.0000 mg | ORAL_TABLET | Freq: Once | ORAL | 0 refills | Status: AC

## 2018-03-06 MED ORDER — METOPROLOL SUCCINATE ER 50 MG PO TB24: 100.0000 mg | ORAL_TABLET | Freq: Every day | ORAL | 3 refills | Status: DC

## 2018-03-06 NOTE — Assessment & Plan Note (Signed)
In regular rhythm today. Rate controlled. Continue to monitor.  

## 2018-03-06 NOTE — Assessment & Plan Note (Signed)
Better on recheck. Continue current regimen. Continue to monitor. Call with any concerns. Continue to monitor.

## 2018-03-06 NOTE — Progress Notes (Signed)
BP (!) 162/93 (BP Location: Left Arm, Cuff Size: Large)   Pulse 98   Temp 98.2 F (36.8 C)   Ht 5' 1.4" (1.56 m)   Wt (!) 310 lb 8 oz (140.8 kg)   LMP 03/17/2009 (Approximate)   SpO2 96%   BMI 57.91 kg/m    Subjective:    Patient ID: Wendy Marsh, female    DOB: 04-Apr-1956, 62 y.o.   MRN: [id number]  HPI: Wendy Marsh is a 61 y.o. female presenting on 03/06/2018 for comprehensive medical examination. Current medical complaints include:  HYPERTENSION / HYPERLIPIDEMIA Satisfied with current treatment? yes Duration of hypertension: chronic BP monitoring frequency: not checking BP medication side effects: no Past BP meds: lisinopril-HCTZ, metoprolol Duration of hyperlipidemia: chronic Cholesterol medication side effects: no Cholesterol supplements: none Past cholesterol medications: none Medication compliance: excellent compliance Aspirin: yes Recent stressors: no Recurrent headaches: no Visual changes: no Palpitations: no Dyspnea: no Chest pain: no Lower extremity edema: no Dizzy/lightheaded: no  HYPOTHYROIDISM Thyroid control status:exacerbated Satisfied with current treatment? no Medication side effects: no Medication compliance: excellent compliance Etiology of hypothyroidism:  Recent dose adjustment:no Fatigue: yes Cold intolerance: no Heat intolerance: no Weight gain: no Weight loss: no Constipation: no Diarrhea/loose stools: no Palpitations: no Lower extremity edema: no Anxiety/depressed mood: no  She currently lives with: husband Menopausal Symptoms: no  Depression Screen done today and results listed below:  Depression screen [facility] 2/9 03/06/2018 05/10/2017 02/26/2016  Decreased Interest 0 0 0  Down, Depressed, Hopeless 0 0 0  PHQ - 2 Score 0 0 0    Past Medical History:  Past Medical History:  Diagnosis Date  . Asthma   . Atrial fibrillation ([place])   . Hyperlipidemia   . Hypertension   . Lumbago   . Osteoporosis   . Thyroid disease      Surgical History:  Past Surgical History:  Procedure Laterality Date  . REPLACEMENT TOTAL KNEE      Medications:  Current Outpatient Medications on File Prior to Visit  Medication Sig  . aspirin 81 MG tablet Take 81 mg by mouth daily.  Marland Kitchen EPINEPHrine 0.3 mg/0.3 mL IJ SOAJ injection   . FLECAINIDE ACETATE PO Take 50 mg by mouth.  . levothyroxine (SYNTHROID, LEVOTHROID) 175 MCG tablet Take 1 tablet (175 mcg total) by mouth daily before breakfast. (Patient not taking: Reported on 02/06/2018)  . Nutritional Supplements (JUICE PLUS FIBRE) LIQD Take by mouth.   No current facility-administered medications on file prior to visit.     Allergies:  Allergies  Allergen Reactions  . Peanut-Containing Drug Products Swelling    Social History:  Social History   Socioeconomic History  . Marital status: Married    Spouse name: Not on file  . Number of children: Not on file  . Years of education: Not on file  . Highest education level: Not on file  Occupational History  . Not on file  Social Needs  . Financial resource strain: Not on file  . Food insecurity:    Worry: Not on file    Inability: Not on file  . Transportation needs:    Medical: Not on file    Non-medical: Not on file  Tobacco Use  . Smoking status: Never Smoker  . Smokeless tobacco: Never Used  Substance and Sexual Activity  . Alcohol use: No  . Drug use: No  . Sexual activity: Yes    Birth control/protection: Post-menopausal  Lifestyle  . Physical activity:  Days per week: Not on file    Minutes per session: Not on file  . Stress: Not on file  Relationships  . Social connections:    Talks on phone: Not on file    Gets together: Not on file    Attends religious service: Not on file    Active member of club or organization: Not on file    Attends meetings of clubs or organizations: Not on file    Relationship status: Not on file  . Intimate partner violence:    Fear of current or ex partner: Not on  file    Emotionally abused: Not on file    Physically abused: Not on file    Forced sexual activity: Not on file  Other Topics Concern  . Not on file  Social History Narrative  . Not on file   Social History   Tobacco Use  Smoking Status Never Smoker  Smokeless Tobacco Never Used   Social History   Substance and Sexual Activity  Alcohol Use No    Family History:  Family History  Problem Relation Age of Onset  . Diabetes Mother   . Cancer Father        ESOPHAGEAL  . Heart disease Father   . Cancer Sister        BREAST AND ESOPHAGEAL  . Diabetes Sister   . Hypertension Sister   . Kidney disease Sister   . Thyroid disease Sister     Past medical history, surgical history, medications, allergies, family history and social history reviewed with patient today and changes made to appropriate areas of the chart.   Review of Systems  Constitutional: Negative.   HENT: Negative.   Eyes: Negative.   Respiratory: Positive for shortness of breath. Negative for cough, hemoptysis, sputum production and wheezing.   Cardiovascular: Positive for leg swelling. Negative for chest pain, palpitations, orthopnea, claudication and PND.  Gastrointestinal: Negative.  Negative for abdominal pain, blood in stool, constipation, diarrhea, heartburn, melena, nausea and vomiting.  Genitourinary: Negative.   Musculoskeletal: Positive for back pain, joint pain and myalgias. Negative for falls and neck pain.  Skin: Negative.   Neurological: Negative.   Endo/Heme/Allergies: Negative.   Psychiatric/Behavioral: Negative.     All other ROS negative except what is listed above and in the HPI.      Objective:    BP (!) 162/93 (BP Location: Left Arm, Cuff Size: Large)   Pulse 98   Temp 98.2 F (36.8 C)   Ht 5' 1.4" (1.56 m)   Wt (!) 310 lb 8 oz (140.8 kg)   LMP 03/17/2009 (Approximate)   SpO2 96%   BMI 57.91 kg/m   Wt Readings from Last 3 Encounters:  03/06/18 (!) 310 lb 8 oz (140.8 kg)    02/06/18 (!) 311 lb 5 oz (141.2 kg)  05/10/17 (!) 302 lb (137 kg)    Physical Exam  Constitutional: She is oriented to person, place, and time. She appears well-developed and well-nourished. No distress.  HENT:  Head: Normocephalic and atraumatic.  Right Ear: Hearing, tympanic membrane, external ear and ear canal normal.  Left Ear: Hearing, tympanic membrane, external ear and ear canal normal.  Nose: Nose normal.  Mouth/Throat: Uvula is midline, oropharynx is clear and moist and mucous membranes are normal. No oropharyngeal exudate.  Eyes: Pupils are equal, round, and reactive to light. Conjunctivae, EOM and lids are normal. Right eye exhibits no discharge. Left eye exhibits no discharge. No scleral icterus.  Neck: Normal  range of motion. Neck supple. No JVD present. No tracheal deviation present. No thyromegaly present.  Cardiovascular: Normal rate, regular rhythm, normal heart sounds and intact distal pulses. Exam reveals no gallop and no friction rub.  No murmur heard. Pulmonary/Chest: Effort normal and breath sounds normal. No stridor. No respiratory distress. She has no wheezes. She has no rales. She exhibits no tenderness. Right breast exhibits no inverted nipple, no mass, no nipple discharge, no skin change and no tenderness. Left breast exhibits no inverted nipple, no mass, no nipple discharge, no skin change and no tenderness. No breast swelling, tenderness, discharge or bleeding. Breasts are symmetrical.  Abdominal: Soft. Bowel sounds are normal. She exhibits no distension and no mass. There is no tenderness. There is no rebound and no guarding. No hernia. Hernia confirmed negative in the right inguinal area and confirmed negative in the left inguinal area.  Genitourinary: Uterus normal. No labial fusion. There is no rash, tenderness, lesion or injury on the right labia. There is no rash, tenderness, lesion or injury on the left labia. Uterus is not deviated, not enlarged, not fixed and  not tender. Cervix exhibits discharge. Cervix exhibits no motion tenderness and no friability. Right adnexum displays no mass, no tenderness and no fullness. Left adnexum displays no mass, no tenderness and no fullness. No erythema, tenderness or bleeding in the vagina. No foreign body in the vagina. No signs of injury around the vagina. Vaginal discharge found.  Musculoskeletal: Normal range of motion. She exhibits no edema, tenderness or deformity.  Lymphadenopathy:    She has no cervical adenopathy. No inguinal adenopathy noted on the right or left side.  Neurological: She is alert and oriented to person, place, and time. She displays normal reflexes. No cranial nerve deficit or sensory deficit. She exhibits normal muscle tone. Coordination normal.  Skin: Skin is warm, dry and intact. Capillary refill takes less than 2 seconds. No rash noted. She is not diaphoretic. No erythema. No pallor.  Psychiatric: She has a normal mood and affect. Her speech is normal and behavior is normal. Judgment and thought content normal. Cognition and memory are normal.  Nursing note and vitals reviewed.   Results for orders placed or performed in visit on 02/06/18  Allergens(7)  Result Value Ref Range   Class Description Comment    Peanut IgE 0.23 (A) Class 0/I kU/L   Hazelnut ([place]) IgE <0.10 Class 0 kU/L   [place] Nut IgE <0.10 Class 0 kU/L   F020-IgE Almond <0.10 Class 0 kU/L   Pecan Nut IgE <0.10 Class 0 kU/L   F202-IgE Cashew Nut <0.10 Class 0 kU/L   Walnut IgE <0.10 Class 0 kU/L      Assessment & Plan:   Problem List Items Addressed This Visit      Cardiovascular and Mediastinum   Hypertension    Better on recheck. Continue current regimen. Continue to monitor. Call with any concerns. Continue to monitor.       Relevant Medications   EPINEPHrine 0.3 mg/0.3 mL IJ SOAJ injection   metoprolol succinate (TOPROL-XL) 50 MG 24 hr tablet   lisinopril-hydrochlorothiazide (PRINZIDE,ZESTORETIC) 20-25  MG tablet   Other Relevant Orders   CBC with Differential/Platelet   Comprehensive metabolic panel   Microalbumin, Urine Waived   TSH   UA/M w/rflx Culture, Routine   Paroxysmal atrial fibrillation (HCC)    In regular rhythm today. Rate controlled. Continue to monitor.       Relevant Medications   EPINEPHrine 0.3 mg/0.3 mL IJ  SOAJ injection   metoprolol succinate (TOPROL-XL) 50 MG 24 hr tablet   lisinopril-hydrochlorothiazide (PRINZIDE,ZESTORETIC) 20-25 MG tablet   Other Relevant Orders   CBC with Differential/Platelet   Comprehensive metabolic panel   UA/M w/rflx Culture, Routine     Endocrine   Thyroid activity decreased    Thinks she's off. Will check labs. Await results. Adjust dose as needed.       Relevant Medications   metoprolol succinate (TOPROL-XL) 50 MG 24 hr tablet   Other Relevant Orders   CBC with Differential/Platelet   Comprehensive metabolic panel   TSH   UA/M w/rflx Culture, Routine     Musculoskeletal and Integument   Primary osteoarthritis of right knee    Will get into PT for aquatherapy. Referral generated today.        Other   Combined fat and carbohydrate induced hyperlipemia    Rechecking levels today. Await results. Call with any concerns.       Relevant Medications   EPINEPHrine 0.3 mg/0.3 mL IJ SOAJ injection   metoprolol succinate (TOPROL-XL) 50 MG 24 hr tablet   lisinopril-hydrochlorothiazide (PRINZIDE,ZESTORETIC) 20-25 MG tablet   Other Relevant Orders   CBC with Differential/Platelet   Comprehensive metabolic panel   Lipid Panel w/o Chol/HDL Ratio   UA/M w/rflx Culture, Routine   Morbid obesity ([place])    Will get into PT for aquatherapy to help with movement. Referral generated today.       Other Visit Diagnoses    Routine general medical examination at a health care facility    -  Primary   Vaccines up to date. Screening labs checked today. Pap done. Mammo and colonoguard ordered. Continue diet and exercise. Call with any  concerns.    Relevant Orders   CBC with Differential/Platelet   Comprehensive metabolic panel   Lipid Panel w/o Chol/HDL Ratio   Microalbumin, Urine Waived   TSH   UA/M w/rflx Culture, Routine   Screening for cervical cancer       Pap done today.   Relevant Orders   IGP, Aptima HPV, rfx 16/18,45   Yeast infection       Will treat with diflucan. Call with any concerns.    Relevant Medications   fluconazole (DIFLUCAN) 150 MG tablet       Follow up plan: Return in about 6 months (around 09/05/2018).   LABORATORY TESTING:  - Pap smear: pap done  IMMUNIZATIONS:   - Tdap: Tetanus vaccination status reviewed: last tetanus booster within 10 years. - Influenza: Postponed to flu season - Pneumovax: Not applicable - Prevnar: Not applicable - HPV: Not applicable - Zostavax vaccine: Given elsewhere  SCREENING: -Mammogram: Order in  - Colonoscopy: Ordered today   PATIENT COUNSELING:   Advised to take 1 mg of folate supplement per day if capable of pregnancy.   Sexuality: Discussed sexually transmitted diseases, partner selection, use of condoms, avoidance of unintended pregnancy  and contraceptive alternatives.   Advised to avoid cigarette smoking.  I discussed with the patient that most people either abstain from alcohol or drink within safe limits (<=14/week and <=4 drinks/occasion for males, <=7/weeks and <= 3 drinks/occasion for females) and that the risk for alcohol disorders and other health effects rises proportionally with the number of drinks per week and how often a drinker exceeds daily limits.  Discussed cessation/primary prevention of drug use and availability of treatment for abuse.   Diet: Encouraged to adjust caloric intake to maintain  or achieve ideal body weight, to reduce  intake of dietary saturated fat and total fat, to limit sodium intake by avoiding high sodium foods and not adding table salt, and to maintain adequate dietary potassium and calcium preferably  from fresh fruits, vegetables, and low-fat dairy products.    stressed the importance of regular exercise  Injury prevention: Discussed safety belts, safety helmets, smoke detector, smoking near bedding or upholstery.   Dental health: Discussed importance of regular tooth brushing, flossing, and dental visits.    NEXT PREVENTATIVE PHYSICAL DUE IN 1 YEAR. Return in about 6 months (around 09/05/2018).

## 2018-03-06 NOTE — Assessment & Plan Note (Signed)
Will get into PT for aquatherapy. Referral generated today.

## 2018-03-06 NOTE — Assessment & Plan Note (Signed)
Rechecking levels today. Await results. Call with any concerns.  

## 2018-03-06 NOTE — Patient Instructions (Addendum)
[facility]  Address: [address], [place], [place] [address]  Phone: [phone]   Health Maintenance for Postmenopausal Women Menopause is a normal process in which your reproductive ability comes to an end. This process happens gradually over a span of months to years, usually between the ages of 48 and 55. Menopause is complete when you have missed 12 consecutive menstrual periods. It is important to talk with your health care provider about some of the most common conditions that affect postmenopausal women, such as heart disease, cancer, and bone loss (osteoporosis). Adopting a healthy lifestyle and getting preventive care can help to promote your health and wellness. Those actions can also lower your chances of developing some of these common conditions. What should I know about menopause? During menopause, you may experience a number of symptoms, such as:  Moderate-to-severe hot flashes.  Night sweats.  Decrease in sex drive.  Mood swings.  Headaches.  Tiredness.  Irritability.  Memory problems.  Insomnia.  Choosing to treat or not to treat menopausal changes is an individual decision that you make with your health care provider. What should I know about hormone replacement therapy and supplements? Hormone therapy products are effective for treating symptoms that are associated with menopause, such as hot flashes and night sweats. Hormone replacement carries certain risks, especially as you become older. If you are thinking about using estrogen or estrogen with progestin treatments, discuss the benefits and risks with your health care provider. What should I know about heart disease and stroke? Heart disease, heart attack, and stroke become more likely as you age. This may be due, in part, to the hormonal changes that your body experiences during menopause. These can affect how your body processes dietary fats, triglycerides, and  cholesterol. Heart attack and stroke are both medical emergencies. There are many things that you can do to help prevent heart disease and stroke:  Have your blood pressure checked at least every 1-2 years. High blood pressure causes heart disease and increases the risk of stroke.  If you are 55-79 years old, ask your health care provider if you should take aspirin to prevent a heart attack or a stroke.  Do not use any tobacco products, including cigarettes, chewing tobacco, or electronic cigarettes. If you need help quitting, ask your health care provider.  It is important to eat a healthy diet and maintain a healthy weight. ? Be sure to include plenty of vegetables, fruits, low-fat dairy products, and lean protein. ? Avoid eating foods that are high in solid fats, added sugars, or salt (sodium).  Get regular exercise. This is one of the most important things that you can do for your health. ? Try to exercise for at least 150 minutes each week. The type of exercise that you do should increase your heart rate and make you sweat. This is known as moderate-intensity exercise. ? Try to do strengthening exercises at least twice each week. Do these in addition to the moderate-intensity exercise.  Know your numbers.Ask your health care provider to check your cholesterol and your blood glucose. Continue to have your blood tested as directed by your health care provider.  What should I know about cancer screening? There are several types of cancer. Take the following steps to reduce your risk and to catch any cancer development as early as possible. Breast Cancer  Practice breast self-awareness. ? This means understanding how your breasts normally appear and feel. ? It also means   doing regular breast self-exams. Let your health care provider know about any changes, no matter how small.  If you are 40 or older, have a clinician do a breast exam (clinical breast exam or CBE) every year. Depending  on your age, family history, and medical history, it may be recommended that you also have a yearly breast X-ray (mammogram).  If you have a family history of breast cancer, talk with your health care provider about genetic screening.  If you are at high risk for breast cancer, talk with your health care provider about having an MRI and a mammogram every year.  Breast cancer (BRCA) gene test is recommended for women who have family members with BRCA-related cancers. Results of the assessment will determine the need for genetic counseling and BRCA1 and for BRCA2 testing. BRCA-related cancers include these types: ? Breast. This occurs in males or females. ? Ovarian. ? Tubal. This may also be called fallopian tube cancer. ? Cancer of the abdominal or pelvic lining (peritoneal cancer). ? Prostate. ? Pancreatic.  Cervical, Uterine, and Ovarian Cancer Your health care provider may recommend that you be screened regularly for cancer of the pelvic organs. These include your ovaries, uterus, and vagina. This screening involves a pelvic exam, which includes checking for microscopic changes to the surface of your cervix (Pap test).  For women ages 21-65, health care providers may recommend a pelvic exam and a Pap test every three years. For women ages 30-65, they may recommend the Pap test and pelvic exam, combined with testing for human papilloma virus (HPV), every five years. Some types of HPV increase your risk of cervical cancer. Testing for HPV may also be done on women of any age who have unclear Pap test results.  Other health care providers may not recommend any screening for nonpregnant women who are considered low risk for pelvic cancer and have no symptoms. Ask your health care provider if a screening pelvic exam is right for you.  If you have had past treatment for cervical cancer or a condition that could lead to cancer, you need Pap tests and screening for cancer for at least 20 years after  your treatment. If Pap tests have been discontinued for you, your risk factors (such as having a new sexual partner) need to be reassessed to determine if you should start having screenings again. Some women have medical problems that increase the chance of getting cervical cancer. In these cases, your health care provider may recommend that you have screening and Pap tests more often.  If you have a family history of uterine cancer or ovarian cancer, talk with your health care provider about genetic screening.  If you have vaginal bleeding after reaching menopause, tell your health care provider.  There are currently no reliable tests available to screen for ovarian cancer.  Lung Cancer Lung cancer screening is recommended for adults 55-80 years old who are at high risk for lung cancer because of a history of smoking. A yearly low-dose CT scan of the lungs is recommended if you:  Currently smoke.  Have a history of at least 30 pack-years of smoking and you currently smoke or have quit within the past 15 years. A pack-year is smoking an average of one pack of cigarettes per day for one year.  Yearly screening should:  Continue until it has been 15 years since you quit.  Stop if you develop a health problem that would prevent you from having lung cancer treatment.  Colorectal   Cancer  This type of cancer can be detected and can often be prevented.  Routine colorectal cancer screening usually begins at age 50 and continues through age 75.  If you have risk factors for colon cancer, your health care provider may recommend that you be screened at an earlier age.  If you have a family history of colorectal cancer, talk with your health care provider about genetic screening.  Your health care provider may also recommend using home test kits to check for hidden blood in your stool.  A small camera at the end of a tube can be used to examine your colon directly (sigmoidoscopy or colonoscopy).  This is done to check for the earliest forms of colorectal cancer.  Direct examination of the colon should be repeated every 5-10 years until age 75. However, if early forms of precancerous polyps or small growths are found or if you have a family history or genetic risk for colorectal cancer, you may need to be screened more often.  Skin Cancer  Check your skin from head to toe regularly.  Monitor any moles. Be sure to tell your health care provider: ? About any new moles or changes in moles, especially if there is a change in a mole's shape or color. ? If you have a mole that is larger than the size of a pencil eraser.  If any of your family members has a history of skin cancer, especially at a young age, talk with your health care provider about genetic screening.  Always use sunscreen. Apply sunscreen liberally and repeatedly throughout the day.  Whenever you are outside, protect yourself by wearing long sleeves, pants, a wide-brimmed hat, and sunglasses.  What should I know about osteoporosis? Osteoporosis is a condition in which bone destruction happens more quickly than new bone creation. After menopause, you may be at an increased risk for osteoporosis. To help prevent osteoporosis or the bone fractures that can happen because of osteoporosis, the following is recommended:  If you are 19-50 years old, get at least 1,000 mg of calcium and at least 600 mg of vitamin D per day.  If you are older than age 50 but younger than age 70, get at least 1,200 mg of calcium and at least 600 mg of vitamin D per day.  If you are older than age 70, get at least 1,200 mg of calcium and at least 800 mg of vitamin D per day.  Smoking and excessive alcohol intake increase the risk of osteoporosis. Eat foods that are rich in calcium and vitamin D, and do weight-bearing exercises several times each week as directed by your health care provider. What should I know about how menopause affects my mental  health? Depression may occur at any age, but it is more common as you become older. Common symptoms of depression include:  Low or sad mood.  Changes in sleep patterns.  Changes in appetite or eating patterns.  Feeling an overall lack of motivation or enjoyment of activities that you previously enjoyed.  Frequent crying spells.  Talk with your health care provider if you think that you are experiencing depression. What should I know about immunizations? It is important that you get and maintain your immunizations. These include:  Tetanus, diphtheria, and pertussis (Tdap) booster vaccine.  Influenza every year before the flu season begins.  Pneumonia vaccine.  Shingles vaccine.  Your health care provider may also recommend other immunizations. This information is not intended to replace advice given to   your health care provider. Make sure you discuss any questions you have with your health care provider. Document Released: 11/11/2005 Document Revised: 04/08/2016 Document Reviewed: 06/23/2015 Elsevier Interactive Patient Education  2018 Elsevier Inc.  

## 2018-03-06 NOTE — Assessment & Plan Note (Signed)
Thinks she's off. Will check labs. Await results. Adjust dose as needed.

## 2018-03-06 NOTE — Assessment & Plan Note (Signed)
Will get into PT for aquatherapy to help with movement. Referral generated today.

## 2018-03-07 LAB — COMPREHENSIVE METABOLIC PANEL
A/G RATIO: 1.1 — AB (ref 1.2–2.2)
ALT: 17 IU/L (ref 0–32)
AST: 13 IU/L (ref 0–40)
Albumin: 3.9 g/dL (ref 3.6–4.8)
Alkaline Phosphatase: 72 IU/L (ref 39–117)
BUN/Creatinine Ratio: 17 (ref 12–28)
BUN: 14 mg/dL (ref 8–27)
Bilirubin Total: 0.3 mg/dL (ref 0.0–1.2)
CALCIUM: 9.5 mg/dL (ref 8.7–10.3)
CO2: 26 mmol/L (ref 20–29)
CREATININE: 0.82 mg/dL (ref 0.57–1.00)
Chloride: 97 mmol/L (ref 96–106)
GFR, EST AFRICAN AMERICAN: 89 mL/min/{1.73_m2} (ref >59)
GFR, EST NON AFRICAN AMERICAN: 77 mL/min/{1.73_m2} (ref >59)
Globulin, Total: 3.4 g/dL (ref 1.5–4.5)
Glucose: 91 mg/dL (ref 65–99)
Potassium: 3.9 mmol/L (ref 3.5–5.2)
Sodium: 139 mmol/L (ref 134–144)
TOTAL PROTEIN: 7.3 g/dL (ref 6.0–8.5)

## 2018-03-07 LAB — CBC WITH DIFFERENTIAL/PLATELET
BASOS: 0 %
Basophils Absolute: 0 10*3/uL (ref 0.0–0.2)
EOS (ABSOLUTE): 0.3 10*3/uL (ref 0.0–0.4)
EOS: 4 %
HEMATOCRIT: 36.5 % (ref 34.0–46.6)
Hemoglobin: 12 g/dL (ref 11.1–15.9)
IMMATURE GRANS (ABS): 0 10*3/uL (ref 0.0–0.1)
IMMATURE GRANULOCYTES: 0 %
LYMPHS: 19 %
Lymphocytes Absolute: 1.5 10*3/uL (ref 0.7–3.1)
MCH: 27.7 pg (ref 26.6–33.0)
MCHC: 32.9 g/dL (ref 31.5–35.7)
MCV: 84 fL (ref 79–97)
Monocytes Absolute: 0.6 10*3/uL (ref 0.1–0.9)
Monocytes: 7 %
NEUTROS PCT: 70 %
Neutrophils Absolute: 5.6 10*3/uL (ref 1.4–7.0)
PLATELETS: 366 10*3/uL (ref 150–450)
RBC: 4.33 x10E6/uL (ref 3.77–5.28)
RDW: 15.2 % (ref 12.3–15.4)
WBC: 8 10*3/uL (ref 3.4–10.8)

## 2018-03-07 LAB — LIPID PANEL W/O CHOL/HDL RATIO
Cholesterol, Total: 201 mg/dL — ABNORMAL HIGH (ref 100–199)
HDL: 51 mg/dL (ref >39)
LDL Calculated: 128 mg/dL — ABNORMAL HIGH (ref 0–99)
Triglycerides: 111 mg/dL (ref 0–149)
VLDL Cholesterol Cal: 22 mg/dL (ref 5–40)

## 2018-03-07 LAB — TSH: TSH: 12.75 u[IU]/mL — AB (ref 0.450–4.500)

## 2018-03-09 ENCOUNTER — Telehealth: Payer: Self-pay | Admitting: Family Medicine

## 2018-03-09 DIAGNOSIS — E039 Hypothyroidism, unspecified: Secondary | ICD-10-CM

## 2018-03-09 LAB — IGP, APTIMA HPV, RFX 16/18,45
HPV APTIMA: NEGATIVE
PAP SMEAR COMMENT: 0

## 2018-03-09 MED ORDER — LEVOTHYROXINE SODIUM 175 MCG PO TABS: 175.0000 ug | ORAL_TABLET | Freq: Every day | ORAL | 0 refills | Status: DC

## 2018-03-09 NOTE — Telephone Encounter (Signed)
Refill sent to her pharmacy. Will recheck in 6 weeks. Order in.

## 2018-03-09 NOTE — Telephone Encounter (Signed)
Patient states that she has not been taking her thyroid medication.

## 2018-03-09 NOTE — Telephone Encounter (Signed)
Please let her know that her labs look great and her pap was normal except her thyroid was underactive. Please double check she's been taking her medicine, and if she is, I'll send in the 230mcg and we'll recheck it in 6 weeks. Thanks!

## 2018-03-27 ENCOUNTER — Other Ambulatory Visit: Payer: Self-pay | Admitting: Family Medicine

## 2018-03-27 DIAGNOSIS — I10 Essential (primary) hypertension: Secondary | ICD-10-CM

## 2018-04-02 DIAGNOSIS — C50919 Malignant neoplasm of unspecified site of unspecified female breast: Secondary | ICD-10-CM

## 2018-04-02 HISTORY — DX: Malignant neoplasm of unspecified site of unspecified female breast: C50.919

## 2018-04-20 ENCOUNTER — Encounter: Payer: Self-pay | Admitting: Family Medicine

## 2018-04-20 ENCOUNTER — Ambulatory Visit (INDEPENDENT_AMBULATORY_CARE_PROVIDER_SITE_OTHER): Payer: BLUE CROSS/BLUE SHIELD | Admitting: Family Medicine

## 2018-04-20 VITALS — BP 170/85 | HR 73 | Temp 98.5°F | Ht 63.5 in | Wt 306.1 lb

## 2018-04-20 DIAGNOSIS — N63 Unspecified lump in unspecified breast: Secondary | ICD-10-CM

## 2018-04-20 NOTE — Progress Notes (Signed)
BP (!) 170/85 (BP Location: Left Arm, Patient Position: Sitting, Cuff Size: Normal)   Pulse 73   Temp 98.5 F (36.9 C)   Ht 5' 3.5" (1.613 m)   Wt (!) 306 lb 1 oz (138.8 kg)   LMP 03/17/2009 (Approximate)   SpO2 93%   BMI 53.37 kg/m    Subjective:    Patient ID: Wendy Marsh, female    DOB: 11-28-1955, 62 y.o.   MRN: [id number]  HPI: Wendy Marsh is a 62 y.o. female  Chief Complaint  Patient presents with  . Breast Mass    X 1 week, right, not painful between 6 and 7 o'clock   BREAST LUMP Duration :1 week Location: R breast, 6-7 o'clock Onset: sudden Redness: no Swelling: no Trauma: no trauma Breastfeeding: no Associated with menstral cycle: no Nipple discharge: no Breast lump: yes Status: stable Treatments attempted: none Previous mammogram: yes  Relevant past medical, surgical, family and social history reviewed and updated as indicated. Interim medical history since our last visit reviewed. Allergies and medications reviewed and updated.  Review of Systems  Constitutional: Negative.   Respiratory: Negative.   Cardiovascular: Negative.   Musculoskeletal: Negative.   Skin: Negative.   Psychiatric/Behavioral: Negative.     Per HPI unless specifically indicated above     Objective:    BP (!) 170/85 (BP Location: Left Arm, Patient Position: Sitting, Cuff Size: Normal)   Pulse 73   Temp 98.5 F (36.9 C)   Ht 5' 3.5" (1.613 m)   Wt (!) 306 lb 1 oz (138.8 kg)   LMP 03/17/2009 (Approximate)   SpO2 93%   BMI 53.37 kg/m   Wt Readings from Last 3 Encounters:  04/20/18 (!) 306 lb 1 oz (138.8 kg)  03/06/18 (!) 310 lb 8 oz (140.8 kg)  02/06/18 (!) 311 lb 5 oz (141.2 kg)    Physical Exam  Constitutional: She is oriented to person, place, and time. She appears well-developed and well-nourished. No distress.  HENT:  Head: Normocephalic and atraumatic.  Right Ear: Hearing normal.  Left Ear: Hearing normal.  Nose: Nose normal.  Eyes:  Conjunctivae and lids are normal. Right eye exhibits no discharge. Left eye exhibits no discharge. No scleral icterus.  Cardiovascular: Normal rate, regular rhythm, normal heart sounds and intact distal pulses. Exam reveals no gallop and no friction rub.  No murmur heard. Pulmonary/Chest: Effort normal and breath sounds normal. No stridor. No respiratory distress. She has no wheezes. She has no rales. She exhibits no tenderness. Right breast exhibits mass (soft, mobile mass at 6 o'clock, feels similar to a milk duct). Right breast exhibits no inverted nipple, no nipple discharge, no skin change and no tenderness. Left breast exhibits no inverted nipple, no mass, no nipple discharge, no skin change and no tenderness. No breast swelling, tenderness, discharge or bleeding. Breasts are symmetrical.  Musculoskeletal: Normal range of motion.  Neurological: She is alert and oriented to person, place, and time.  Skin: Skin is warm, dry and intact. Capillary refill takes less than 2 seconds. No rash noted. She is not diaphoretic. No erythema. No pallor.  Psychiatric: She has a normal mood and affect. Her speech is normal and behavior is normal. Judgment and thought content normal. Cognition and memory are normal.  Nursing note and vitals reviewed.   Results for orders placed or performed in visit on 03/06/18  CBC with Differential/Platelet  Result Value Ref Range   WBC 8.0 3.4 - 10.8 x10E3/uL   RBC  4.33 3.77 - 5.28 x10E6/uL   Hemoglobin 12.0 11.1 - 15.9 g/dL   Hematocrit 36.5 34.0 - 46.6 %   MCV 84 79 - 97 fL   MCH 27.7 26.6 - 33.0 pg   MCHC 32.9 31.5 - 35.7 g/dL   RDW 15.2 12.3 - 15.4 %   Platelets 366 150 - 450 x10E3/uL   Neutrophils 70 Not Estab. %   Lymphs 19 Not Estab. %   Monocytes 7 Not Estab. %   Eos 4 Not Estab. %   Basos 0 Not Estab. %   Neutrophils Absolute 5.6 1.4 - 7.0 x10E3/uL   Lymphocytes Absolute 1.5 0.7 - 3.1 x10E3/uL   Monocytes Absolute 0.6 0.1 - 0.9 x10E3/uL   EOS  (ABSOLUTE) 0.3 0.0 - 0.4 x10E3/uL   Basophils Absolute 0.0 0.0 - 0.2 x10E3/uL   Immature Granulocytes 0 Not Estab. %   Immature Grans (Abs) 0.0 0.0 - 0.1 x10E3/uL  Comprehensive metabolic panel  Result Value Ref Range   Glucose 91 65 - 99 mg/dL   BUN 14 8 - 27 mg/dL   Creatinine, Ser 0.82 0.57 - 1.00 mg/dL   GFR calc non Af Amer 77 >59 mL/min/1.73   GFR calc Af Amer 89 >59 mL/min/1.73   BUN/Creatinine Ratio 17 12 - 28   Sodium 139 134 - 144 mmol/L   Potassium 3.9 3.5 - 5.2 mmol/L   Chloride 97 96 - 106 mmol/L   CO2 26 20 - 29 mmol/L   Calcium 9.5 8.7 - 10.3 mg/dL   Total Protein 7.3 6.0 - 8.5 g/dL   Albumin 3.9 3.6 - 4.8 g/dL   Globulin, Total 3.4 1.5 - 4.5 g/dL   Albumin/Globulin Ratio 1.1 (L) 1.2 - 2.2   Bilirubin Total 0.3 0.0 - 1.2 mg/dL   Alkaline Phosphatase 72 39 - 117 IU/L   AST 13 0 - 40 IU/L   ALT 17 0 - 32 IU/L  Lipid Panel w/o Chol/HDL Ratio  Result Value Ref Range   Cholesterol, Total 201 (H) 100 - 199 mg/dL   Triglycerides 111 0 - 149 mg/dL   HDL 51 >39 mg/dL   VLDL Cholesterol Cal 22 5 - 40 mg/dL   LDL Calculated 128 (H) 0 - 99 mg/dL  TSH  Result Value Ref Range   TSH 12.750 (H) 0.450 - 4.500 uIU/mL  IGP, Aptima HPV, rfx 16/18,45  Result Value Ref Range   DIAGNOSIS: Comment    Specimen adequacy: Comment    Clinician Provided ICD10 Comment    Performed by: Comment    PAP Smear Comment .    Note: Comment    Test Methodology Comment    HPV Aptima Negative Negative      Assessment & Plan:   Problem List Items Addressed This Visit    None    Visit Diagnoses    Breast lump    -  Primary   Will arrange mammogram and ultrasound. Orders in. They will call. Call with any concerns.    Relevant Orders   MM DIAG BREAST TOMO BILATERAL   US BREAST LTD UNI LEFT INC AXILLA   US BREAST LTD UNI RIGHT INC AXILLA       Follow up plan: Return As scheduled.

## 2018-04-24 ENCOUNTER — Telehealth: Payer: Self-pay | Admitting: Family Medicine

## 2018-04-24 NOTE — Telephone Encounter (Signed)
Please do not close referral for 1 year so she can rebook it.   Tiff- can you please check with her about whether she wants the referral still please.

## 2018-04-24 NOTE — Telephone Encounter (Signed)
Copied from [place] [id number]. Topic: Referral - Status >> Apr 24, 2018 10:13 AM Robina Ade, Helene Kelp D wrote: Reason for CRM: Patient made appt for 04/25/18 for a mammo and cancel appt. Just a FYI and should I close referral? Please advice.

## 2018-04-25 NOTE — Telephone Encounter (Signed)
Patient is scheduled for mammogram on Friday 04/27/18. I do not see any type of referral.

## 2018-04-27 ENCOUNTER — Ambulatory Visit
Admission: RE | Admit: 2018-04-27 | Discharge: 2018-04-27 | Disposition: A | Discharge status: Home / Self Care | Payer: BLUE CROSS/BLUE SHIELD | Source: Ambulatory Visit | Attending: Family Medicine | Admitting: Family Medicine

## 2018-04-27 DIAGNOSIS — N63 Unspecified lump in unspecified breast: Secondary | ICD-10-CM | POA: Diagnosis present

## 2018-04-30 ENCOUNTER — Other Ambulatory Visit: Payer: Self-pay | Admitting: Family Medicine

## 2018-04-30 DIAGNOSIS — N6310 Unspecified lump in the right breast, unspecified quadrant: Secondary | ICD-10-CM

## 2018-04-30 DIAGNOSIS — R928 Other abnormal and inconclusive findings on diagnostic imaging of breast: Secondary | ICD-10-CM

## 2018-05-02 ENCOUNTER — Ambulatory Visit
Admission: RE | Admit: 2018-05-02 | Discharge: 2018-05-02 | Disposition: A | Discharge status: Home / Self Care | Payer: BLUE CROSS/BLUE SHIELD | Source: Ambulatory Visit | Attending: Family Medicine | Admitting: Family Medicine

## 2018-05-02 DIAGNOSIS — N6310 Unspecified lump in the right breast, unspecified quadrant: Secondary | ICD-10-CM

## 2018-05-02 DIAGNOSIS — R928 Other abnormal and inconclusive findings on diagnostic imaging of breast: Secondary | ICD-10-CM

## 2018-05-02 HISTORY — PX: BREAST BIOPSY: SHX20

## 2018-05-03 ENCOUNTER — Other Ambulatory Visit: Payer: Self-pay | Admitting: Pathology

## 2018-05-07 ENCOUNTER — Encounter: Payer: Self-pay | Admitting: *Deleted

## 2018-05-07 NOTE — Progress Notes (Signed)
  Oncology Nurse Navigator Documentation  Navigator Location: [facility] (05/07/18 1400) Referral date to RadOnc/MedOnc: 05/08/18 (05/07/18 1400) )Navigator Encounter Type: Introductory phone call (05/07/18 1400)   Abnormal Finding Date: 05/02/18 (05/07/18 1400) Confirmed Diagnosis Date: 05/04/18 (05/07/18 1400)                   Barriers/Navigation Needs: Coordination of Care (05/07/18 1400)   Interventions: Coordination of Care;Education (05/07/18 1400)   Coordination of Care: Appts (05/07/18 1400) Education Method: Verbal (05/07/18 1400)                Time Spent with Patient: 45 (05/07/18 1400)   Spoke to patient on Friday to establish navigation services.  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  She is scheduled to see Dr. Tasia Catchings tomorrow at 11:00 and Dr. Bary Castilla tomorrow at 4:30.  Will give her educational literature tomorrow at the medical oncology consult.

## 2018-05-08 ENCOUNTER — Telehealth: Payer: Self-pay | Admitting: Genetic Counselor

## 2018-05-08 ENCOUNTER — Encounter: Payer: Self-pay | Admitting: *Deleted

## 2018-05-08 ENCOUNTER — Inpatient Hospital Stay: Payer: BLUE CROSS/BLUE SHIELD

## 2018-05-08 ENCOUNTER — Encounter: Payer: Self-pay | Admitting: Oncology

## 2018-05-08 ENCOUNTER — Ambulatory Visit: Payer: BLUE CROSS/BLUE SHIELD | Admitting: General Surgery

## 2018-05-08 ENCOUNTER — Inpatient Hospital Stay: Payer: BLUE CROSS/BLUE SHIELD | Attending: Oncology | Admitting: Oncology

## 2018-05-08 ENCOUNTER — Other Ambulatory Visit: Payer: Self-pay

## 2018-05-08 ENCOUNTER — Encounter: Payer: Self-pay | Admitting: General Surgery

## 2018-05-08 VITALS — BP 122/66 | HR 70 | Resp 14 | Ht 61.0 in | Wt 302.0 lb

## 2018-05-08 VITALS — BP 143/73 | HR 71 | Temp 98.7°F | Resp 18 | Ht 61.0 in | Wt 304.3 lb

## 2018-05-08 DIAGNOSIS — Z5189 Encounter for other specified aftercare: Secondary | ICD-10-CM | POA: Insufficient documentation

## 2018-05-08 DIAGNOSIS — C50511 Malignant neoplasm of lower-outer quadrant of right female breast: Secondary | ICD-10-CM | POA: Diagnosis present

## 2018-05-08 DIAGNOSIS — E876 Hypokalemia: Secondary | ICD-10-CM | POA: Insufficient documentation

## 2018-05-08 DIAGNOSIS — M818 Other osteoporosis without current pathological fracture: Secondary | ICD-10-CM

## 2018-05-08 DIAGNOSIS — Z171 Estrogen receptor negative status [ER-]: Secondary | ICD-10-CM | POA: Diagnosis not present

## 2018-05-08 DIAGNOSIS — Z17421 Hormone receptor negative with human epidermal growth factor receptor 2 negative status: Secondary | ICD-10-CM | POA: Insufficient documentation

## 2018-05-08 DIAGNOSIS — Z5111 Encounter for antineoplastic chemotherapy: Secondary | ICD-10-CM | POA: Diagnosis not present

## 2018-05-08 DIAGNOSIS — Z853 Personal history of malignant neoplasm of breast: Secondary | ICD-10-CM | POA: Insufficient documentation

## 2018-05-08 DIAGNOSIS — E079 Disorder of thyroid, unspecified: Secondary | ICD-10-CM | POA: Diagnosis not present

## 2018-05-08 DIAGNOSIS — Z7189 Other specified counseling: Secondary | ICD-10-CM | POA: Insufficient documentation

## 2018-05-08 DIAGNOSIS — I501 Left ventricular failure: Secondary | ICD-10-CM | POA: Insufficient documentation

## 2018-05-08 DIAGNOSIS — Z803 Family history of malignant neoplasm of breast: Secondary | ICD-10-CM | POA: Diagnosis not present

## 2018-05-08 DIAGNOSIS — C50919 Malignant neoplasm of unspecified site of unspecified female breast: Secondary | ICD-10-CM | POA: Insufficient documentation

## 2018-05-08 DIAGNOSIS — I10 Essential (primary) hypertension: Secondary | ICD-10-CM

## 2018-05-08 DIAGNOSIS — I4891 Unspecified atrial fibrillation: Secondary | ICD-10-CM | POA: Diagnosis not present

## 2018-05-08 HISTORY — DX: Hormone receptor negative with human epidermal growth factor receptor 2 negative status: Z17.421

## 2018-05-08 LAB — CBC WITH DIFFERENTIAL/PLATELET
BASOS PCT: 2 %
Basophils Absolute: 0.2 10*3/uL — ABNORMAL HIGH (ref 0–0.1)
EOS ABS: 0.2 10*3/uL (ref 0–0.7)
Eosinophils Relative: 2 %
HCT: 37.7 % (ref 35.0–47.0)
HEMOGLOBIN: 12.5 g/dL (ref 12.0–16.0)
LYMPHS ABS: 1.2 10*3/uL (ref 1.0–3.6)
Lymphocytes Relative: 13 %
MCH: 28.1 pg (ref 26.0–34.0)
MCHC: 33.3 g/dL (ref 32.0–36.0)
MCV: 84.4 fL (ref 80.0–100.0)
MONO ABS: 0.5 10*3/uL (ref 0.2–0.9)
MONOS PCT: 6 %
NEUTROS PCT: 77 %
Neutro Abs: 7.1 10*3/uL — ABNORMAL HIGH (ref 1.4–6.5)
Platelets: 281 10*3/uL (ref 150–440)
RBC: 4.46 MIL/uL (ref 3.80–5.20)
RDW: 14.5 % (ref 11.5–14.5)
WBC: 9.2 10*3/uL (ref 3.6–11.0)

## 2018-05-08 LAB — COMPREHENSIVE METABOLIC PANEL
ALBUMIN: 3.8 g/dL (ref 3.5–5.0)
ALK PHOS: 68 U/L (ref 38–126)
ALT: 22 U/L (ref 0–44)
AST: 23 U/L (ref 15–41)
Anion gap: 13 (ref 5–15)
BUN: 17 mg/dL (ref 8–23)
CALCIUM: 9.3 mg/dL (ref 8.9–10.3)
CO2: 26 mmol/L (ref 22–32)
CREATININE: 0.84 mg/dL (ref 0.44–1.00)
Chloride: 100 mmol/L (ref 98–111)
GFR calc non Af Amer: >60 mL/min (ref >60)
GLUCOSE: 104 mg/dL — AB (ref 70–99)
Potassium: 3.4 mmol/L — ABNORMAL LOW (ref 3.5–5.1)
SODIUM: 139 mmol/L (ref 135–145)
Total Bilirubin: 0.4 mg/dL (ref 0.3–1.2)
Total Protein: 8 g/dL (ref 6.5–8.1)

## 2018-05-08 LAB — SURGICAL PATHOLOGY

## 2018-05-08 NOTE — Progress Notes (Signed)
DISCONTINUE ON PATHWAY REGIMEN - Breast     A cycle is every 21 days:     Doxorubicin      Cyclophosphamide      Paclitaxel      Carboplatin   **Always confirm dose/schedule in your pharmacy ordering system**  REASON: Other Reason PRIOR TREATMENT: [id number]: AC [Doxorubicin + Cyclophosphamide q21 Days x 4 Cycles], Followed by Paclitaxel 80 mg/m2 Weekly + Carboplatin AUC=6 q21 Days x 12 Weeks TREATMENT RESPONSE: Unable to Evaluate  START ON PATHWAY REGIMEN - Breast     A cycle is every 14 days (cycles 1-4):     Doxorubicin      Cyclophosphamide      [id number]    A cycle is every 21 days (cycles 5-8):     Paclitaxel      Carboplatin   **Always confirm dose/schedule in your pharmacy ordering system**  Patient Characteristics: Preoperative or Nonsurgical Candidate (Clinical Staging), Neoadjuvant Therapy followed by Surgery, Invasive Disease, Chemotherapy, HER2 Negative/Unknown/Equivocal, ER Negative/Unknown, Platinum Therapy Indicated Therapeutic Status: Preoperative or Nonsurgical Candidate (Clinical Staging) AJCC M Category: cM0 AJCC Grade: G3 Breast Surgical Plan: Neoadjuvant Therapy followed by Surgery ER Status: Negative (-) AJCC 8 Stage Grouping: IIB HER2 Status: Negative (-) AJCC T Category: cT2 AJCC N Category: cN0 PR Status: Negative (-) Type of Therapy: Platinum Therapy Indicated Intent of Therapy: Curative Intent, Discussed with Patient

## 2018-05-08 NOTE — Progress Notes (Signed)
START ON PATHWAY REGIMEN - Breast     A cycle is every 21 days:     Doxorubicin      Cyclophosphamide      Paclitaxel      Carboplatin   **Always confirm dose/schedule in your pharmacy ordering system**    Patient Characteristics: Preoperative or Nonsurgical Candidate (Clinical Staging), Neoadjuvant Therapy followed by Surgery, Invasive Disease, Chemotherapy, HER2 Negative/Unknown/Equivocal, ER Negative/Unknown, Platinum Therapy Indicated Therapeutic Status: Preoperative or Nonsurgical Candidate (Clinical Staging) AJCC M Category: cM0 AJCC Grade: G3 Breast Surgical Plan: Neoadjuvant Therapy followed by Surgery ER Status: Negative (-) AJCC 8 Stage Grouping: IIB HER2 Status: Negative (-) AJCC T Category: cT2 AJCC N Category: cN0 PR Status: Negative (-) Type of Therapy: Platinum Therapy Indicated Intent of Therapy: Curative Intent, Discussed with Patient 

## 2018-05-08 NOTE — Progress Notes (Signed)
  Oncology Nurse Navigator Documentation  Navigator Location: [facility] (05/08/18 1300) Referral date to RadOnc/MedOnc: 05/08/18 (05/08/18 1300) )Navigator Encounter Type: Initial MedOnc (05/08/18 1300)         Genetic Counseling Date: 05/08/18 (05/08/18 1300) Genetic Counseling Type: Non-Urgent (05/08/18 1300)       Treatment Initiated Date: 05/16/18 (05/08/18 1300) Patient Visit Type: MedOnc (05/08/18 1300) Treatment Phase: Pre-Tx/Tx Discussion (05/08/18 1300) Barriers/Navigation Needs: Education (05/08/18 1300) Education: Newly Diagnosed Cancer Education (05/08/18 1300) Interventions: Education (05/08/18 1300)     Education Method: Verbal;Written (05/08/18 1300)  Support Groups/Services: Breast Support Group (05/08/18 1300)             Time Spent with Patient: 60 (05/08/18 1300)   Met patient, her husband and her 3 daughters today during her initial medical oncology consult.  Dr. 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  Gave patient breast cancer educational literature, "My Breast Cancer Treatment Handbook" by Josephine Igo, RN.  She is to call if she has any questions or needs.

## 2018-05-08 NOTE — Telephone Encounter (Signed)
Dr. Tasia Catchings is referring Wendy Marsh for genetic counseling due to a personal and family history of breast cancer. I left her a message to call and schedule this telegenetics visit to be done by phone at her convenience. Of note, her blood was already drawn in clinic and sent to [facility].   Steele Berg, [place], [place] Genetic Counselor Phone: [phone]

## 2018-05-08 NOTE — Progress Notes (Signed)
Patient here for initial evaluation.  °

## 2018-05-08 NOTE — Progress Notes (Addendum)
Hematology/Oncology Consult note [facility] [phone] [phone]   Patient Care Team: Valerie Roys, DO as PCP - General (Family Medicine) Thornton Park, MD as Referring Physician (Orthopedic Surgery)  REFERRING PROVIDER: Valerie Roys, DO CHIEF COMPLAINTS/REASON FOR VISIT:  Evaluation of Breast cancer  HISTORY OF PRESENTING ILLNESS:  Wendy Marsh is a  62 y.o.  female with PMH listed below who was referred to me for evaluation of breast cancer Patient report feeling right breast mass for about 1 month. She mentioned to primary care provider and had diagnostic mammogram Mammogram showed right breast 6 o'clock axis breast mass, 2.9 cm corresponding to area of concern.  Target [place] was performed showed irregular hypoechoic mass in right breast Right axilla shows no enlarged or morphologically abnormal lymph nodes.  Breast mass was biopsied, and pathology showed invasive mammary carcinoma.  Grade 3 , ER/PR- HER2 -.  LVI suspicious, favor present.   Nipple discharge: denies.  Breast Skin changes, denies Family history: sister has history of breast cancer, father deceased from esophageal cancer. ? M grandmother breast cancer.   History of radiation to chest: denies.  Previous breast surgery:   Patient presents with multiple family members including husband, three daughters, and son in Sports coach.  She walks with a walker. Chronic right knee pain due to osteoarthritis. Paroxymal A fib, on metoprolol for rate control.  Asprin 46m daily   Review of Systems  Constitutional: Negative for chills, fever, malaise/fatigue and weight loss.  HENT: Negative for nosebleeds and sore throat.   Eyes: Negative for double vision, photophobia and redness.  Respiratory: Negative for cough, shortness of breath and wheezing.   Cardiovascular: Negative for chest pain, palpitations and orthopnea.  Gastrointestinal: Negative for abdominal pain, blood  in stool, nausea and vomiting.  Genitourinary: Negative for dysuria.  Musculoskeletal: Positive for joint pain. Negative for back pain, myalgias and neck pain.       Chronic right knee pain.  Skin: Negative for itching and rash.  Neurological: Negative for dizziness, tingling and tremors.  Endo/Heme/Allergies: Negative for environmental allergies. Does not bruise/bleed easily.  Psychiatric/Behavioral: Negative for depression.    MEDICAL HISTORY:  Past Medical History:  Diagnosis Date  . Atrial fibrillation ([place]   . Hyperlipidemia   . Hypertension   . Lumbago   . Osteoporosis   . Thyroid disease     SURGICAL HISTORY: Past Surgical History:  Procedure Laterality Date  . BREAST BIOPSY  05/02/2018  . REPLACEMENT TOTAL KNEE      SOCIAL HISTORY: Social History   Socioeconomic History  . Marital status: Married    Spouse name: Not on file  . Number of children: Not on file  . Years of education: Not on file  . Highest education level: Not on file  Occupational History  . Not on file  Social Needs  . Financial resource strain: Not on file  . Food insecurity:    Worry: Not on file    Inability: Not on file  . Transportation needs:    Medical: Not on file    Non-medical: Not on file  Tobacco Use  . Smoking status: Never Smoker  . Smokeless tobacco: Never Used  Substance and Sexual Activity  . Alcohol use: No  . Drug use: No  . Sexual activity: Yes    Birth control/protection: Post-menopausal  Lifestyle  . Physical activity:    Days per week: Not on file    Minutes per session: Not on file  .  Stress: Not on file  Relationships  . Social connections:    Talks on phone: Not on file    Gets together: Not on file    Attends religious service: Not on file    Active member of club or organization: Not on file    Attends meetings of clubs or organizations: Not on file    Relationship status: Not on file  . Intimate partner violence:    Fear of current or ex  partner: Not on file    Emotionally abused: Not on file    Physically abused: Not on file    Forced sexual activity: Not on file  Other Topics Concern  . Not on file  Social History Narrative  . Not on file    FAMILY HISTORY: Family History  Problem Relation Age of Onset  . Diabetes Mother   . Congestive Heart Failure Mother   . Cancer Father        ESOPHAGEAL  . Cancer Sister        BREAST AND ESOPHAGEAL  . Breast cancer Sister 42  . Breast cancer Maternal Grandmother   . Diabetes Sister   . Hypertension Sister   . Kidney disease Sister   . Thyroid disease Sister     ALLERGIES:  is allergic to peanut-containing drug products.  MEDICATIONS:  Current Outpatient Medications  Medication Sig Dispense Refill  . EPINEPHrine 0.3 mg/0.3 mL IJ SOAJ injection   12  . FLECAINIDE ACETATE PO Take 50 mg by mouth.    . levothyroxine (SYNTHROID, LEVOTHROID) 175 MCG tablet Take 1 tablet (175 mcg total) by mouth daily before breakfast. 90 tablet 0  . lisinopril-hydrochlorothiazide (PRINZIDE,ZESTORETIC) 20-25 MG tablet Take 2 tablets by mouth daily. 180 tablet 3  . metoprolol succinate (TOPROL-XL) 50 MG 24 hr tablet Take 2 tablets (100 mg total) by mouth daily. Take with or immediately following a meal. 180 tablet 3  . naproxen sodium (ALEVE) 220 MG tablet Take 220 mg by mouth daily as needed.    . Nutritional Supplements (JUICE PLUS FIBRE) LIQD Take by mouth.    Marland Kitchen aspirin 81 MG tablet Take 81 mg by mouth daily.     No current facility-administered medications for this visit.      PHYSICAL EXAMINATION: ECOG PERFORMANCE STATUS: 1 - Symptomatic but completely ambulatory Vitals:   05/08/18 1110  BP: (!) 143/73  Pulse: 71  Resp: 18  Temp: 98.7 F (37.1 C)   Filed Weights   05/08/18 1110  Weight: (!) 304 lb 4.8 oz (138 kg)    Physical Exam  Constitutional: She is oriented to person, place, and time. No distress.  Obese,   HENT:  Head: Normocephalic and atraumatic.  Right Ear:  External ear normal.  Left Ear: External ear normal.  Mouth/Throat: Oropharynx is clear and moist.  Eyes: Pupils are equal, round, and reactive to light. EOM are normal. No scleral icterus.  Neck: Normal range of motion. Neck supple.  Cardiovascular: Normal rate, regular rhythm and normal heart sounds.  Pulmonary/Chest: Effort normal. No respiratory distress. She has no wheezes.  Abdominal: Soft. Bowel sounds are normal. She exhibits no distension and no mass. There is no tenderness.  Musculoskeletal: Normal range of motion. She exhibits no edema or deformity.  Neurological: She is alert and oriented to person, place, and time. No cranial nerve deficit. Coordination normal.  Skin: Skin is warm and dry. No rash noted. No erythema.  Psychiatric: She has a normal mood and affect. Her behavior is  normal.  Breast exam was performed in seated and lying down position. Right breast palpable 3cm firm fixed mass, 6 o'oclock axis. No evidence of palpable left breast mass.  No palpable axillary lymph nodes bilaterally.     LABORATORY DATA:  I have reviewed the data as listed Lab Results  Component Value Date   WBC 9.2 05/08/2018   HGB 12.5 05/08/2018   HCT 37.7 05/08/2018   MCV 84.4 05/08/2018   PLT 281 05/08/2018   Recent Labs    05/10/17 1140 03/06/18 1111 05/08/18 1215  NA 144 139 139  K 4.3 3.9 3.4*  CL 99 97 100  CO2 _0 GLUCOSE 85 91 104*  BUN _1 CREATININE 0.90 0.82 0.84  CALCIUM 9.6 9.5 9.3  GFRNONAA 69 77 >60  GFRAA 79 89 >60  PROT 7.0 7.3 8.0  ALBUMIN 3.9 3.9 3.8  AST _2 ALT _3 ALKPHOS 80 72 68  BILITOT 0.3 0.3 0.4      ASSESSMENT & PLAN:  1. Malignant neoplasm of lower-outer quadrant of right female breast, unspecified estrogen receptor status ([place])   2. 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  Pathology was reviewed. Discussed with patient about Triple negative breast cancer diagnosis. Prognosis.  Recommend neuadjuvant chemotherapy with AC--> carboplatin and Paclitaxel.  I explained to the patient the risks and benefits of chemotherapy ddAdriamycin, Cyclophosphamide, Carboplatin, paclitaxel including all but not limited to hair loss, mouth sore, nausea, vomiting, low blood counts, bleeding, bladder inflammation/bleeding,  cardiac toxicity, neuropathy and risk of life threatening infection and even death, secondary malignancy etc.  Patient voices understanding and willing to proceed chemotherapy.  # Goal of care was discussed with patient. Treatment is curative intent.  # Growth factor-Udenyca would be given as prophylaxis for chemotherapy-induced neutropenia to prevent febrile neutropenias. Discussed potential side effect- myalgias/arthralgias- recommend Claritin for 4 days.   # Obtain baseline MUGA testing to assess cardiac function.  # Chemotherapy education; port placement. Hopefully the planned start chemotherapy late next week. Marland Kitchen Antiemetics-Zofran and Compazine; EMLA cream sent to pharmacy Check baseline labs. # Family history of breast cancer plus personal history of triple negative breast cancer Recommend genetic counseling. Will refer today.   Patient and family members have a lot of questions and I answered all to their satisfaction.   Orders Placed This Encounter  Procedures  . NM Cardiac Muga Rest    Standing Status:   Future    Standing Expiration Date:   05/08/2019    Order Specific Question:   ** REASON FOR EXAM (FREE TEXT)    Answer:   Breast cancer need baseline prior to start of chemo    Order Specific Question:   Preferred imaging location?    Answer:   [facility]    Order Specific Question:   Radiology  Contrast Protocol - do NOT remove file path    Answer:   \\[email or web address]  . Cancer antigen 27.29    Standing Status:   Future    Number of Occurrences:   1    Standing Expiration Date:   05/09/2019  . Cancer antigen 15-3    Standing Status:  Future    Number of Occurrences:   1    Standing Expiration Date:   05/09/2019  . CBC with Differential/Platelet    Standing Status:   Future    Number of Occurrences:   1    Standing Expiration Date:   05/09/2019  . Comprehensive metabolic panel    Standing Status:   Future    Number of Occurrences:   1    Standing Expiration Date:   05/09/2019    All questions were answered. The patient knows to call the clinic with any problems questions or concerns.  Return of visit: day 1 of neoadjuvant chemotherapy.  Thank you for this kind referral and the opportunity to participate in the care of this patient. A copy of today's note is routed to referring provider  Total face to face encounter time for this patient visit was 60  min. >50% of the time was  spent in counseling and coordination of care.   Earlie Server, MD, PhD Hematology Oncology [facility]- [phone] 05/08/2018   Addendum Obtained baseline MUGA testing. Patient has mild decreased LVEF, 45%.  Patient was asked to follow up with her cardiologist [facility] for further evaluation.  Given the cardiac toxicity of Adriamycin, will opt to use a non Anthracycline regimen.  Tammy Sours et al 2018 Clin Cancer Res, Pathological Response and Survival in Triple-Negative Breast Cancer Following Neoadjuvant Carboplatin plus Docetaxel. Stage I-III TNBC patients, Neoadjuvant carboplatin (AUC6) plus docetaxel (75 mg/m2) every 21 days  6 cycles. pCR was 55% and Residual cancer burden (RCB) were 13%. Excellent Three-year RFS was 90% in patients with pCR and 66% in those without pCR.  Discussed with patient over the phone. She agrees with the plan. We  will discuss again on the chemotherapy day.   Earlie Server

## 2018-05-08 NOTE — Patient Instructions (Addendum)
The patient is aware to call back for any questions or concerns Implanted Port Insertion Implanted port insertion is a procedure to put in a port and catheter. The port is a device with an injectable disk that can be accessed by your health care provider. The port is connected to a vein in the chest or neck by a small flexible tube (catheter). There are different types of ports. The implanted port may be used as a long-term IV access for:  Medicines, such as chemotherapy.  Fluids.  Liquid nutrition, such as total parenteral nutrition (TPN).  Blood samples.  Having a port means that your health care provider will not need to use the veins in your arms for these procedures. Tell a health care provider about:  Any allergies you have.  All medicines you are taking, especially blood thinners, as well as any vitamins, herbs, eye drops, creams, over-the-counter medicines, and steroids.  Any problems you or family members have had with anesthetic medicines.  Any blood disorders you have.  Any surgeries you have had.  Any medical conditions you have, including diabetes or kidney problems.  Whether you are pregnant or may be pregnant. What are the risks? Generally, this is a safe procedure. However, problems may occur, including:  Allergic reactions to medicines or dyes.  Damage to other structures or organs.  Infection.  Damage to the blood vessel, bruising, or bleeding at the puncture site.  Blood clot.  Breakdown of the skin over the port.  A collection of air in the chest that can cause one of the lungs to collapse (pneumothorax). This is rare.  What happens before the procedure? Staying hydrated Follow instructions from your health care provider about hydration, which may include:  Up to 2 hours before the procedure - you may continue to drink clear liquids, such as water, clear fruit juice, black coffee, and plain tea.  Eating and drinking restrictions  Follow  instructions from your health care provider about eating and drinking, which may include: ? 8 hours before the procedure - stop eating heavy meals or foods such as meat, fried foods, or fatty foods. ? 6 hours before the procedure - stop eating light meals or foods, such as toast or cereal. ? 6 hours before the procedure - stop drinking milk or drinks that contain milk. ? 2 hours before the procedure - stop drinking clear liquids. Medicines  Ask your health care provider about: ? Changing or stopping your regular medicines. This is especially important if you are taking diabetes medicines or blood thinners. ? Taking medicines such as aspirin and ibuprofen. These medicines can thin your blood. Do not take these medicines before your procedure if your health care provider instructs you not to.  You may be given antibiotic medicine to help prevent infection. General instructions  Plan to have someone take you home from the hospital or clinic.  If you will be going home right after the procedure, plan to have someone with you for 24 hours.  You may have blood tests.  You may be asked to shower with a germ-killing soap. What happens during the procedure?  To lower your risk of infection: ? Your health care team will wash or sanitize their hands. ? Your skin will be washed with soap. ? Hair may be removed from the surgical area.  An IV tube will be inserted into one of your veins.  You will be given one or more of the following: ? A medicine to help  you relax (sedative). ? A medicine to numb the area (local anesthetic).  Two small cuts (incisions) will be made to insert the port. ? One incision will be made in your neck to get access to the vein where the catheter will lie. ? The other incision will be made in the upper chest. This is where the port will lie.  The procedure may be done using continuous X-ray (fluoroscopy) or other imaging tools for guidance.  The port and catheter  will be placed. There may be a small, raised area where the port is.  The port will be flushed with a salt solution (saline), and blood will be drawn to make sure that it is working correctly.  The incisions will be closed.  Bandages (dressings) may be placed over the incisions. The procedure may vary among health care providers and hospitals. What happens after the procedure?  Your blood pressure, heart rate, breathing rate, and blood oxygen level will be monitored until the medicines you were given have worn off.  Do not drive for 24 hours if you were given a sedative.  You will be given a manufacturer's information card for the type of port that you have. Keep this with you.  Your port will need to be flushed and checked as told by your health care provider, usually every few weeks.  A chest X-ray will be done to: ? Check the placement of the port. ? Make sure there is no injury to your lung. Summary  Implanted port insertion is a procedure to put in a port and catheter.  The implanted port is used as a long-term IV access.  The port will need to be flushed and checked as told by your health care provider, usually every few weeks.  Keep your manufacturer's information card with you at all times. This information is not intended to replace advice given to you by your health care provider. Make sure you discuss any questions you have with your health care provider. Document Released: 07/10/2013 Document Revised: 08/10/2016 Document Reviewed: 08/10/2016 Elsevier Interactive Patient Education  2017 [employer].

## 2018-05-08 NOTE — Progress Notes (Signed)
Patient ID: Wendy Marsh, female   DOB: May 19, 1956, 62 y.o.   MRN: [id number]  Chief Complaint  Patient presents with  . Breast Problem    HPI Wendy Marsh is a 62 y.o. female.  who presents for a breast evaluation referred by Tanya Nones RN B[facility]CP. The most recent mammogram and right breast biopsy was done on 05-02-18.  Patient does perform regular self breast checks but does not get regular mammograms done, she thought she had kept them up better.   She found a right breast knot during her shower after her physical with Park Liter DO. She had her appointment with Dr Tasia Catchings this morning. She is here with her husband, [name] and daughter, [name].  HPI  Past Medical History:  Diagnosis Date  . Atrial fibrillation ([place])   . Hyperlipidemia   . Hypertension   . Lumbago   . Osteoporosis   . Thyroid disease     Past Surgical History:  Procedure Laterality Date  . BREAST BIOPSY  05/02/2018  . REPLACEMENT TOTAL KNEE      Family History  Problem Relation Age of Onset  . Diabetes Mother   . Congestive Heart Failure Mother   . Cancer Father        ESOPHAGEAL  . Cancer Sister        BREAST AND ESOPHAGEAL  . Breast cancer Sister 30  . Breast cancer Maternal Grandmother   . Diabetes Sister   . Hypertension Sister   . Kidney disease Sister   . Thyroid disease Sister     Social History Social History   Tobacco Use  . Smoking status: Never Smoker  . Smokeless tobacco: Never Used  Substance Use Topics  . Alcohol use: No  . Drug use: No    Allergies  Allergen Reactions  . Peanut-Containing Drug Products Swelling    Current Outpatient Medications  Medication Sig Dispense Refill  . aspirin 81 MG tablet Take 81 mg by mouth daily.    Marland Kitchen EPINEPHrine 0.3 mg/0.3 mL IJ SOAJ injection   12  . FLECAINIDE ACETATE PO Take 50 mg by mouth.    . levothyroxine (SYNTHROID, LEVOTHROID) 175 MCG tablet Take 1 tablet (175 mcg total) by mouth daily before breakfast. 90 tablet 0  .  lisinopril-hydrochlorothiazide (PRINZIDE,ZESTORETIC) 20-25 MG tablet Take 2 tablets by mouth daily. 180 tablet 3  . metoprolol succinate (TOPROL-XL) 50 MG 24 hr tablet Take 2 tablets (100 mg total) by mouth daily. Take with or immediately following a meal. 180 tablet 3  . naproxen sodium (ALEVE) 220 MG tablet Take 220 mg by mouth daily as needed.    . Nutritional Supplements (JUICE PLUS FIBRE) LIQD Take by mouth.     No current facility-administered medications for this visit.     Review of Systems Review of Systems  Constitutional: Negative.   Respiratory: Negative.   Cardiovascular: Negative.     Blood pressure 122/66, pulse 70, resp. rate 14, height '5\' 1"'$  (1.549 m), weight (!) 302 lb (137 kg), last menstrual period 03/17/2009, SpO2 95 %.  Physical Exam Physical Exam  Constitutional: She is oriented to person, place, and time. She appears well-developed and well-nourished.  Eyes: Conjunctivae are normal. No scleral icterus.  Neck: Neck supple.  Cardiovascular: Normal rate, regular rhythm and normal heart sounds.  Pulmonary/Chest: Effort normal and breath sounds normal. Right breast exhibits no inverted nipple, no mass, no nipple discharge, no skin change and no tenderness. Left breast exhibits no inverted nipple, no nipple  discharge, no skin change and no tenderness.    Lymphadenopathy:    She has no cervical adenopathy.    She has no axillary adenopathy.       Right: No supraclavicular adenopathy present.       Left: Supraclavicular adenopathy present.  Neurological: She is alert and oriented to person, place, and time.  Skin: Skin is warm and dry.    Data Reviewed  July 26-31 mammograms and ultrasounds reviewed.   DIAGNOSIS:  A. RIGHT BREAST, LOWER OUTER QUADRANT; ULTRASOUND-GUIDED CORE BIOPSY:  - INVASIVE MAMMARY CARCINOMA.   Size of invasive carcinoma: 15 mm in this sample  Histologic grade of invasive carcinoma: Grade 3            Glandular/tubular  differentiation score: 3            Nuclear pleomorphism score: 3            Mitotic rate score: 3            Total score: 9  Ductal carcinoma in situ: Not identified  Lymphovascular invasion: Suspicious, favor present  Estrogen Receptor (ER) Status: NEGATIVE (less than 1%)  Internal control cells absent   Progesterone Receptor (PgR) Status: NEGATIVE (less than 1%)  Internal control cells absent   HER2 (by immunohistochemistry): NEGATIVE (0)     Assessment    Clinical stage II carcinoma of the right breast.  Planned neo-adjuvant chemotherapy.    Plan   The risks associated with central venous access including arterial, pulmonary and venous injury were reviewed. The possible need for additional treatment if pulmonary injury occurs (chest tube placement) was discussed.  Considering her body habitus, I think she will be at the lowest risk with an internal jugular line. This can be tunneled away from the breast for post operative radiotherapy.  Do the late hour, this will be scheduled tomorrow for early next week.   Laboratory studies obtained earlier today show a mild hypokalemia. Will start the patient on KCl, 20 mEq daily prior to port placement.   HPI, Physical Exam, Assessment and Plan have been scribed under the direction and in the presence of Hervey Ard, MD.  Gaspar Cola, CMA  I have completed the exam and reviewed the above documentation for accuracy and completeness.  I agree with the above.  Haematologist has been used and any errors in dictation or transcription are unintentional.  Hervey Ard, M.D., F.A.C.S. [place] Satara Virella 05/08/2018, 9:13 PM

## 2018-05-09 ENCOUNTER — Telehealth: Payer: Self-pay

## 2018-05-09 ENCOUNTER — Other Ambulatory Visit: Payer: Self-pay

## 2018-05-09 ENCOUNTER — Other Ambulatory Visit: Payer: Self-pay | Admitting: General Surgery

## 2018-05-09 DIAGNOSIS — C50511 Malignant neoplasm of lower-outer quadrant of right female breast: Secondary | ICD-10-CM

## 2018-05-09 DIAGNOSIS — Z171 Estrogen receptor negative status [ER-]: Principal | ICD-10-CM

## 2018-05-09 LAB — CANCER ANTIGEN 15-3: CAN 15 3: 11.2 U/mL (ref 0.0–25.0)

## 2018-05-09 LAB — CANCER ANTIGEN 27.29: CA 27.29: 25.7 U/mL (ref 0.0–38.6)

## 2018-05-09 MED ORDER — POTASSIUM CHLORIDE CRYS ER 20 MEQ PO TBCR: EXTENDED_RELEASE_TABLET | ORAL | 0 refills | Status: DC

## 2018-05-09 MED ORDER — LIDOCAINE-PRILOCAINE 2.5-2.5 % EX CREA: TOPICAL_CREAM | CUTANEOUS | 3 refills | Status: DC

## 2018-05-09 MED ORDER — PROCHLORPERAZINE MALEATE 10 MG PO TABS: 10.0000 mg | ORAL_TABLET | Freq: Four times a day (QID) | ORAL | 1 refills | Status: DC | PRN

## 2018-05-09 MED ORDER — ONDANSETRON HCL 8 MG PO TABS: 8.0000 mg | ORAL_TABLET | Freq: Two times a day (BID) | ORAL | 1 refills | Status: DC | PRN

## 2018-05-09 NOTE — Telephone Encounter (Signed)
Message left for patient to call back about scheduling her port placement.  

## 2018-05-09 NOTE — Addendum Note (Signed)
Addended by: Earlie Server on: 05/09/2018 08:55 AM   Modules accepted: Orders

## 2018-05-09 NOTE — Telephone Encounter (Signed)
The patient is scheduled for port placement surgery at [facility] on 05/11/18. She will pre admit by phone. She will make use of Potassium 20 meq once daily until surgery day. The patient is aware of date and instructions.

## 2018-05-10 ENCOUNTER — Inpatient Hospital Stay: Payer: BLUE CROSS/BLUE SHIELD

## 2018-05-10 ENCOUNTER — Encounter
Admission: RE | Admit: 2018-05-10 | Discharge: 2018-05-10 | Disposition: A | Discharge status: Home / Self Care | Payer: BLUE CROSS/BLUE SHIELD | Source: Ambulatory Visit | Attending: General Surgery | Admitting: General Surgery

## 2018-05-10 ENCOUNTER — Telehealth: Payer: Self-pay | Admitting: Genetic Counselor

## 2018-05-10 ENCOUNTER — Other Ambulatory Visit: Payer: Self-pay

## 2018-05-10 HISTORY — DX: Unspecified osteoarthritis, unspecified site: M19.90

## 2018-05-10 HISTORY — DX: Cardiac arrhythmia, unspecified: I49.9

## 2018-05-10 HISTORY — DX: Hypothyroidism, unspecified: E03.9

## 2018-05-10 MED ORDER — CEFAZOLIN SODIUM-DEXTROSE 2-4 GM/100ML-% IV SOLN: 2.0000 g | INTRAVENOUS | Status: DC

## 2018-05-10 NOTE — Pre-Procedure Instructions (Signed)
Name Value Ref Range  Vent Rate (bpm) 73   PR Interval (msec) 202   QRS Interval (msec) 94   QT Interval (msec) 408   QTc (msec) 449   Other Result Information  This result has an attachment that is not available.  Result Narrative  Normal sinus rhythm Nonspecific T wave abnormalities Abnormal ECG When compared with ECG of 20-Mar-2018 11:44, MANUAL COMPARISON REQUIRED, DATA IS UNCONFIRMED I reviewed and concur with this report. Electronically signed [email or web address] MD, Darnell Level [phone]) on 03/27/2018 9:15:43 AM  Status Results Details

## 2018-05-10 NOTE — Telephone Encounter (Signed)
Cancer Genetics            Telegenetics Initial Visit    Patient Name: Wendy Marsh Patient DOB: [birth year] Patient Age: 62 y.o. Phone Call Date: 05/10/2018  Referring Provider: Earlie Server, MD  Reason for Visit: Evaluate for hereditary susceptibility to cancer    Assessment and Plan:  . 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 Additionally, there is no information regarding her biological father or any paternal relatives.   . Testing is recommended to determine whether she has a pathogenic mutation that will impact her decision for breast surgery after chemotherapy as well as screening and risk-reduction for cancer. A negative result will be reassuring.  . Wendy Marsh wished to pursue genetic testing, and will schedule a blood draw. Analysis will include the 84 genes on [facility]'s Multi-Cancer panel (AIP, ALK, APC, ATM, AXIN2, BAP1, BARD1, BLM, BMPR1A, BRCA1, BRCA2, BRIP1, CASR, CDC73, CDH1, CDK4, CDKN1B, CDKN1C, CDKN2A, CEBPA, CHEK2, CTNNA1, DICER1, DIS3L2, EGFR, EPCAM, FH, FLCN, GATA2, GPC3, GREM1, HOXB13, HRAS, KIT, MAX, MEN1, MET, MITF, MLH1, MSH2, MSH3, MSH6, MUTYH, NBN, NF1, NF2, NTHL1, PALB2, PDGFRA, PHOX2B, PMS2, POLD1, POLE, POT1, PRKAR1A, PTCH1, PTEN, RAD50, RAD51C, RAD51D, RB1, RECQL4, RET, RUNX1, SDHA, SDHAF2, SDHB, SDHC, SDHD, SMAD4, SMARCA4, SMARCB1, SMARCE1, STK11, SUFU, TERC, TERT, TMEM127, TP53, TSC1, TSC2, VHL, WRN, WT1).   . Her blood was already drawn on 05/08/18 while she was in clinic. Results should be available in approximately 2-3 weeks, at which point we will contact her and address implications for her as well as address genetic testing for at-risk family members, if needed.     Dr. Grayland Ormond was available for questions concerning this case. Total time spent by counseling by phone was approximately 20 minutes.     _____________________________________________________________________   History of Present Illness: Wendy Marsh, a 62 y.o. female, was referred for genetic counseling to discuss the possibility of a hereditary predisposition to cancer and discuss whether genetic testing is warranted. This was a telegenetics visit via phone.  Wendy Marsh was recently diagnosed with breast cancer at the age of 40. She is will be receiving neoadjuvant chemotherapy.  The breast tumor was ER negative, PR negative, and HER2 negative.    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[place])    05/09/2018 -  Chemotherapy    The patient had DOXOrubicin (ADRIAMYCIN) chemo injection 146 mg, 60 mg/m2, Intravenous,  Once, 0 of 4 cycles palonosetron (ALOXI) injection 0.25 mg, 0.25 mg, Intravenous,  Once, 0 of 8 cycles pegfilgrastim-cbqv (UDENYCA) injection 6 mg, 6 mg, Subcutaneous, Once, 0 of 4 cycles CARBOplatin (PARAPLATIN) in sodium chloride 0.9 % 100 mL chemo infusion, , Intravenous,  Once, 0 of 4 cycles cyclophosphamide (CYTOXAN) 1,460 mg in sodium chloride 0.9 % 250 mL chemo infusion, 600 mg/m2, Intravenous,  Once, 0 of 4 cycles PACLitaxel (TAXOL) 192 mg in sodium chloride 0.9 % 250 mL chemo infusion (</= '80mg'$ /m2), 80 mg/m2, Intravenous,  Once, 0 of 4 cycles fosaprepitant (EMEND) 150 mg, dexamethasone (DECADRON) 12 mg in sodium chloride 0.9 % 145 mL IVPB, , Intravenous,  Once, 0 of 8 cycles  for chemotherapy treatment.      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 Arthritis   . Atrial fibrillation ([place])   . Dysrhythmia    afib  . Hyperlipidemia   . Hypertension   . Hypothyroidism   . Lumbago   . Osteoporosis   . Thyroid disease     Past Surgical History:  Procedure Laterality Date  . BREAST BIOPSY  05/02/2018  . JOINT REPLACEMENT Left    tkr  . REPLACEMENT TOTAL KNEE      Family History: Significant diagnoses include the following:  Family History  Problem Relation Age of Onset  . Diabetes Mother   . Congestive Heart Failure Mother   . Breast cancer Sister 82       also esophageal ca at 66; currently 11  . Breast cancer Maternal Grandmother        dx 47s; deceased 71s  . Diabetes Sister   . Hypertension Sister   . Kidney disease Sister   . Thyroid disease Sister   . Cancer Maternal Uncle 38       deceased 36s  . Other Father        No information on father or paternal relatives    Additionally, Wendy Marsh has 3 daughters (ages 76, 72 and 70). Other than her sister noted above, she has another sister (age 68). Her biological mother died at 51. She had 2 brothers. She has no information about her biological father.  Wendy Marsh ancestry is Caucasian - NOS. There is no known Jewish ancestry and no consanguinity.  Discussion: We reviewed the characteristics, features and inheritance patterns of hereditary cancer syndromes. We discussed her risk of harboring a mutation in the context of her personal and family history. We discussed that her unknown paternal family makes risk assessment challenging. We discussed the process of genetic testing, insurance coverage and implications of results: positive, negative and variant of uncertain significance (VUS).   Wendy Marsh questions were answered to her satisfaction today and she is welcome to call with any additional questions or concerns. Thank you for the referral and allowing [place] to share in the care of your patient.    Steele Berg, MS, [place] Certified Genetic  Counselor phone: [phone]

## 2018-05-10 NOTE — Telephone Encounter (Signed)
Ms. Hyden called back and opted to schedule her telegenetics visit later today, 05/10/18, at [facility], [place], [facility] Genetic Counselor Phone: [phone] [email or web address]

## 2018-05-10 NOTE — Patient Instructions (Signed)
Your procedure is scheduled on: 05/11/18 Report to Day Surgery. [facility]OND FLOOR To find out your arrival time please call [phone] between 1PM - 3PM on 05/10/18 Remember: Instructions that are not followed completely may result in serious medical risk,  up to and including death, or upon the discretion of your surgeon and anesthesiologist your  surgery may need to be rescheduled.     _X__ 1. Do not eat food after midnight the night before your procedure.                 No gum chewing or hard candies. You may drink clear liquids up to 2 hours                 before you are scheduled to arrive for your surgery- DO not drink clear                 liquids within 2 hours of the start of your surgery.                 Clear Liquids include:  water, apple juice without pulp, clear carbohydrate                 drink such as Clearfast of Gatorade, Black Coffee or Tea (Do not add                 anything to coffee or tea).  __X__2.  On the morning of surgery brush your teeth with toothpaste and water, you                may rinse your mouth with mouthwash if you wish.  Do not swallow any toothpaste of mouthwash.     _X__ 3.  No Alcohol for 24 hours before or after surgery.   _X__ 4.  Do Not Smoke or use e-cigarettes For 24 Hours Prior to Your Surgery.                 Do not use any chewable tobacco products for at least 6 hours prior to                 surgery.  ____  5.  Bring all medications with you on the day of surgery if instructed.   __X__  6.  Notify your doctor if there is any change in your medical condition      (cold, fever, infections).     Do not wear jewelry, make-up, hairpins, clips or nail polish. Do not wear lotions, powders, or perfumes. You may wear deodorant. Do not shave 48 hours prior to surgery. Men may shave face and neck. Do not bring valuables to the hospital.    [facility] is not responsible for any belongings or  valuables.  Contacts, dentures or bridgework may not be worn into surgery. Leave your suitcase in the car. After surgery it may be brought to your room. For patients admitted to the hospital, discharge time is determined by your treatment team.   Patients discharged the day of surgery will not be allowed to drive hoME     __X__ Take these medicines the morning of surgery with A SIP OF WATER:    1. FLECAINIDE  2. LEVOTHYROXINE  3. METOPROLOL  4.  5.  6.  ____ Fleet Enema (as directed)   ____ Use CHG Soap as directed  ____ Use inhalers on the day of surgery  ____ Stop metformin 2 days prior to surgery  ____ Take 1/2 of usual insulin dose the night before surgery. No insulin the morning          of surgery.   ____ Stop Coumadin/Plavix/aspirin on  ____ Stop Anti-inflammatories on   ____ Stop supplements until after surgery.    ____ Bring C-Pap to the hospital.

## 2018-05-10 NOTE — Pre-Procedure Instructions (Signed)
SPOKE WITH DR A PENWARDEN,ANES, RE BMI 57.06. OK TO PROCEED AT [facility]

## 2018-05-10 NOTE — Patient Instructions (Signed)
Doxorubicin injection What is this medicine? DOXORUBICIN (dox oh ROO bi sin) is a chemotherapy drug. It is used to treat many kinds of cancer like leukemia, lymphoma, neuroblastoma, sarcoma, and Wilms' tumor. It is also used to treat bladder cancer, breast cancer, lung cancer, ovarian cancer, stomach cancer, and thyroid cancer. This medicine may be used for other purposes; ask your health care provider or pharmacist if you have questions. COMMON BRAND NAME(S): Adriamycin, Adriamycin PFS, Adriamycin RDF, Rubex What should I tell my health care provider before I take this medicine? They need to know if you have any of these conditions: -heart disease -history of low blood counts caused by a medicine -liver disease -recent or ongoing radiation therapy -an unusual or allergic reaction to doxorubicin, other chemotherapy agents, other medicines, foods, dyes, or preservatives -pregnant or trying to get pregnant -breast-feeding How should I use this medicine? This drug is given as an infusion into a vein. It is administered in a hospital or clinic by a specially trained health care professional. If you have pain, swelling, burning or any unusual feeling around the site of your injection, tell your health care professional right away. Talk to your pediatrician regarding the use of this medicine in children. Special care may be needed. Overdosage: If you think you have taken too much of this medicine contact a poison control center or emergency room at once. NOTE: This medicine is only for you. Do not share this medicine with others. What if I miss a dose? It is important not to miss your dose. Call your doctor or health care professional if you are unable to keep an appointment. What may interact with this medicine? This medicine may interact with the following medications: -6-mercaptopurine -paclitaxel -phenytoin -St. John's Wort -trastuzumab -verapamil This list may not describe all possible  interactions. Give your health care provider a list of all the medicines, herbs, non-prescription drugs, or dietary supplements you use. Also tell them if you smoke, drink alcohol, or use illegal drugs. Some items may interact with your medicine. What should I watch for while using this medicine? This drug may make you feel generally unwell. This is not uncommon, as chemotherapy can affect healthy cells as well as cancer cells. Report any side effects. Continue your course of treatment even though you feel ill unless your doctor tells you to stop. There is a maximum amount of this medicine you should receive throughout your life. The amount depends on the medical condition being treated and your overall health. Your doctor will watch how much of this medicine you receive in your lifetime. Tell your doctor if you have taken this medicine before. You may need blood work done while you are taking this medicine. Your urine may turn red for a few days after your dose. This is not blood. If your urine is dark or brown, call your doctor. In some cases, you may be given additional medicines to help with side effects. Follow all directions for their use. Call your doctor or health care professional for advice if you get a fever, chills or sore throat, or other symptoms of a cold or flu. Do not treat yourself. This drug decreases your body's ability to fight infections. Try to avoid being around people who are sick. This medicine may increase your risk to bruise or bleed. Call your doctor or health care professional if you notice any unusual bleeding. Talk to your doctor about your risk of cancer. You may be more at risk for certain   types of cancers if you take this medicine. Do not become pregnant while taking this medicine or for 6 months after stopping it. Women should inform their doctor if they wish to become pregnant or think they might be pregnant. Men should not father a child while taking this medicine and  for 6 months after stopping it. There is a potential for serious side effects to an unborn child. Talk to your health care professional or pharmacist for more information. Do not breast-feed an infant while taking this medicine. This medicine has caused ovarian failure in some women and reduced sperm counts in some men This medicine may interfere with the ability to have a child. Talk with your doctor or health care professional if you are concerned about your fertility. What side effects may I notice from receiving this medicine? Side effects that you should report to your doctor or health care professional as soon as possible: -allergic reactions like skin rash, itching or hives, swelling of the face, lips, or tongue -breathing problems -chest pain -fast or irregular heartbeat -low blood counts - this medicine may decrease the number of white blood cells, red blood cells and platelets. You may be at increased risk for infections and bleeding. -pain, redness, or irritation at site where injected -signs of infection - fever or chills, cough, sore throat, pain or difficulty passing urine -signs of decreased platelets or bleeding - bruising, pinpoint red spots on the skin, black, tarry stools, blood in the urine -swelling of the ankles, feet, hands -tiredness -weakness Side effects that usually do not require medical attention (report to your doctor or health care professional if they continue or are bothersome): -diarrhea -hair loss -mouth sores -nail discoloration or damage -nausea -red colored urine -vomiting This list may not describe all possible side effects. Call your doctor for medical advice about side effects. You may report side effects to FDA at 1-800-FDA-1088. Where should I keep my medicine? This drug is given in a hospital or clinic and will not be stored at home. NOTE: This sheet is a summary. It may not cover all possible information. If you have questions about this  medicine, talk to your doctor, pharmacist, or health care provider.  2018 [employer]/Gold Standard (2015-11-16 11:28:51) Cyclophosphamide injection What is this medicine? CYCLOPHOSPHAMIDE (sye kloe FOSS fa mide) is a chemotherapy drug. It slows the growth of cancer cells. This medicine is used to treat many types of cancer like lymphoma, myeloma, leukemia, breast cancer, and ovarian cancer, to name a few. This medicine may be used for other purposes; ask your health care provider or pharmacist if you have questions. COMMON BRAND NAME(S): Cytoxan, Neosar What should I tell my health care provider before I take this medicine? They need to know if you have any of these conditions: -blood disorders -history of other chemotherapy -infection -kidney disease -liver disease -recent or ongoing radiation therapy -tumors in the bone marrow -an unusual or allergic reaction to cyclophosphamide, other chemotherapy, other medicines, foods, dyes, or preservatives -pregnant or trying to get pregnant -breast-feeding How should I use this medicine? This drug is usually given as an injection into a vein or muscle or by infusion into a vein. It is administered in a hospital or clinic by a specially trained health care professional. Talk to your pediatrician regarding the use of this medicine in children. Special care may be needed. Overdosage: If you think you have taken too much of this medicine contact a poison control center or emergency room at   once. NOTE: This medicine is only for you. Do not share this medicine with others. What if I miss a dose? It is important not to miss your dose. Call your doctor or health care professional if you are unable to keep an appointment. What may interact with this medicine? This medicine may interact with the following medications: -amiodarone -amphotericin B -azathioprine -certain antiviral medicines for HIV or AIDS such as protease inhibitors (e.g., indinavir,  ritonavir) and zidovudine -certain blood pressure medications such as benazepril, captopril, enalapril, fosinopril, lisinopril, moexipril, monopril, perindopril, quinapril, ramipril, trandolapril -certain cancer medications such as anthracyclines (e.g., daunorubicin, doxorubicin), busulfan, cytarabine, paclitaxel, pentostatin, tamoxifen, trastuzumab -certain diuretics such as chlorothiazide, chlorthalidone, hydrochlorothiazide, indapamide, metolazone -certain medicines that treat or prevent blood clots like warfarin -certain muscle relaxants such as succinylcholine -cyclosporine -etanercept -indomethacin -medicines to increase blood counts like filgrastim, pegfilgrastim, sargramostim -medicines used as general anesthesia -metronidazole -natalizumab This list may not describe all possible interactions. Give your health care provider a list of all the medicines, herbs, non-prescription drugs, or dietary supplements you use. Also tell them if you smoke, drink alcohol, or use illegal drugs. Some items may interact with your medicine. What should I watch for while using this medicine? Visit your doctor for checks on your progress. This drug may make you feel generally unwell. This is not uncommon, as chemotherapy can affect healthy cells as well as cancer cells. Report any side effects. Continue your course of treatment even though you feel ill unless your doctor tells you to stop. Drink water or other fluids as directed. Urinate often, even at night. In some cases, you may be given additional medicines to help with side effects. Follow all directions for their use. Call your doctor or health care professional for advice if you get a fever, chills or sore throat, or other symptoms of a cold or flu. Do not treat yourself. This drug decreases your body's ability to fight infections. Try to avoid being around people who are sick. This medicine may increase your risk to bruise or bleed. Call your doctor or  health care professional if you notice any unusual bleeding. Be careful brushing and flossing your teeth or using a toothpick because you may get an infection or bleed more easily. If you have any dental work done, tell your dentist you are receiving this medicine. You may get drowsy or dizzy. Do not drive, use machinery, or do anything that needs mental alertness until you know how this medicine affects you. Do not become pregnant while taking this medicine or for 1 year after stopping it. Women should inform their doctor if they wish to become pregnant or think they might be pregnant. Men should not father a child while taking this medicine and for 4 months after stopping it. There is a potential for serious side effects to an unborn child. Talk to your health care professional or pharmacist for more information. Do not breast-feed an infant while taking this medicine. This medicine may interfere with the ability to have a child. This medicine has caused ovarian failure in some women. This medicine has caused reduced sperm counts in some men. You should talk with your doctor or health care professional if you are concerned about your fertility. If you are going to have surgery, tell your doctor or health care professional that you have taken this medicine. What side effects may I notice from receiving this medicine? Side effects that you should report to your doctor or health care professional as   soon as possible: -allergic reactions like skin rash, itching or hives, swelling of the face, lips, or tongue -low blood counts - this medicine may decrease the number of white blood cells, red blood cells and platelets. You may be at increased risk for infections and bleeding. -signs of infection - fever or chills, cough, sore throat, pain or difficulty passing urine -signs of decreased platelets or bleeding - bruising, pinpoint red spots on the skin, black, tarry stools, blood in the urine -signs of  decreased red blood cells - unusually weak or tired, fainting spells, lightheadedness -breathing problems -dark urine -dizziness -palpitations -swelling of the ankles, feet, hands -trouble passing urine or change in the amount of urine -weight gain -yellowing of the eyes or skin Side effects that usually do not require medical attention (report to your doctor or health care professional if they continue or are bothersome): -changes in nail or skin color -hair loss -missed menstrual periods -mouth sores -nausea, vomiting This list may not describe all possible side effects. Call your doctor for medical advice about side effects. You may report side effects to FDA at 1-800-FDA-1088. Where should I keep my medicine? This drug is given in a hospital or clinic and will not be stored at home. NOTE: This sheet is a summary. It may not cover all possible information. If you have questions about this medicine, talk to your doctor, pharmacist, or health care provider.  2018 [employer]/Gold Standard (2012-08-03 16:22:58) Paclitaxel injection What is this medicine? PACLITAXEL (PAK li TAX el) is a chemotherapy drug. It targets fast dividing cells, like cancer cells, and causes these cells to die. This medicine is used to treat ovarian cancer, breast cancer, and other cancers. This medicine may be used for other purposes; ask your health care provider or pharmacist if you have questions. COMMON BRAND NAME(S): Onxol, Taxol What should I tell my health care provider before I take this medicine? They need to know if you have any of these conditions: -blood disorders -irregular heartbeat -infection (especially a virus infection such as chickenpox, cold sores, or herpes) -liver disease -previous or ongoing radiation therapy -an unusual or allergic reaction to paclitaxel, alcohol, polyoxyethylated castor oil, other chemotherapy agents, other medicines, foods, dyes, or preservatives -pregnant or trying to  get pregnant -breast-feeding How should I use this medicine? This drug is given as an infusion into a vein. It is administered in a hospital or clinic by a specially trained health care professional. Talk to your pediatrician regarding the use of this medicine in children. Special care may be needed. Overdosage: If you think you have taken too much of this medicine contact a poison control center or emergency room at once. NOTE: This medicine is only for you. Do not share this medicine with others. What if I miss a dose? It is important not to miss your dose. Call your doctor or health care professional if you are unable to keep an appointment. What may interact with this medicine? Do not take this medicine with any of the following medications: -disulfiram -metronidazole This medicine may also interact with the following medications: -cyclosporine -diazepam -ketoconazole -medicines to increase blood counts like filgrastim, pegfilgrastim, sargramostim -other chemotherapy drugs like cisplatin, doxorubicin, epirubicin, etoposide, teniposide, vincristine -quinidine -testosterone -vaccines -verapamil Talk to your doctor or health care professional before taking any of these medicines: -acetaminophen -aspirin -ibuprofen -ketoprofen -naproxen This list may not describe all possible interactions. Give your health care provider a list of all the medicines, herbs, non-prescription drugs, or   dietary supplements you use. Also tell them if you smoke, drink alcohol, or use illegal drugs. Some items may interact with your medicine. What should I watch for while using this medicine? Your condition will be monitored carefully while you are receiving this medicine. You will need important blood work done while you are taking this medicine. This medicine can cause serious allergic reactions. To reduce your risk you will need to take other medicine(s) before treatment with this medicine. If you  experience allergic reactions like skin rash, itching or hives, swelling of the face, lips, or tongue, tell your doctor or health care professional right away. In some cases, you may be given additional medicines to help with side effects. Follow all directions for their use. This drug may make you feel generally unwell. This is not uncommon, as chemotherapy can affect healthy cells as well as cancer cells. Report any side effects. Continue your course of treatment even though you feel ill unless your doctor tells you to stop. Call your doctor or health care professional for advice if you get a fever, chills or sore throat, or other symptoms of a cold or flu. Do not treat yourself. This drug decreases your body's ability to fight infections. Try to avoid being around people who are sick. This medicine may increase your risk to bruise or bleed. Call your doctor or health care professional if you notice any unusual bleeding. Be careful brushing and flossing your teeth or using a toothpick because you may get an infection or bleed more easily. If you have any dental work done, tell your dentist you are receiving this medicine. Avoid taking products that contain aspirin, acetaminophen, ibuprofen, naproxen, or ketoprofen unless instructed by your doctor. These medicines may hide a fever. Do not become pregnant while taking this medicine. Women should inform their doctor if they wish to become pregnant or think they might be pregnant. There is a potential for serious side effects to an unborn child. Talk to your health care professional or pharmacist for more information. Do not breast-feed an infant while taking this medicine. Men are advised not to father a child while receiving this medicine. This product may contain alcohol. Ask your pharmacist or healthcare provider if this medicine contains alcohol. Be sure to tell all healthcare providers you are taking this medicine. Certain medicines, like metronidazole  and disulfiram, can cause an unpleasant reaction when taken with alcohol. The reaction includes flushing, headache, nausea, vomiting, sweating, and increased thirst. The reaction can last from 30 minutes to several hours. What side effects may I notice from receiving this medicine? Side effects that you should report to your doctor or health care professional as soon as possible: -allergic reactions like skin rash, itching or hives, swelling of the face, lips, or tongue -low blood counts - This drug may decrease the number of white blood cells, red blood cells and platelets. You may be at increased risk for infections and bleeding. -signs of infection - fever or chills, cough, sore throat, pain or difficulty passing urine -signs of decreased platelets or bleeding - bruising, pinpoint red spots on the skin, black, tarry stools, nosebleeds -signs of decreased red blood cells - unusually weak or tired, fainting spells, lightheadedness -breathing problems -chest pain -high or low blood pressure -mouth sores -nausea and vomiting -pain, swelling, redness or irritation at the injection site -pain, tingling, numbness in the hands or feet -slow or irregular heartbeat -swelling of the ankle, feet, hands Side effects that usually do not  require medical attention (report to your doctor or health care professional if they continue or are bothersome): -bone pain -complete hair loss including hair on your head, underarms, pubic hair, eyebrows, and eyelashes -changes in the color of fingernails -diarrhea -loosening of the fingernails -loss of appetite -muscle or joint pain -red flush to skin -sweating This list may not describe all possible side effects. Call your doctor for medical advice about side effects. You may report side effects to FDA at 1-800-FDA-1088. Where should I keep my medicine? This drug is given in a hospital or clinic and will not be stored at home. NOTE: This sheet is a summary. It  may not cover all possible information. If you have questions about this medicine, talk to your doctor, pharmacist, or health care provider.  2018 [employer] Standard (2015-07-21 19:58:00) Carboplatin injection What is this medicine? CARBOPLATIN (KAR boe pla tin) is a chemotherapy drug. It targets fast dividing cells, like cancer cells, and causes these cells to die. This medicine is used to treat ovarian cancer and many other cancers. This medicine may be used for other purposes; ask your health care provider or pharmacist if you have questions. COMMON BRAND NAME(S): Paraplatin What should I tell my health care provider before I take this medicine? They need to know if you have any of these conditions: -blood disorders -hearing problems -kidney disease -recent or ongoing radiation therapy -an unusual or allergic reaction to carboplatin, cisplatin, other chemotherapy, other medicines, foods, dyes, or preservatives -pregnant or trying to get pregnant -breast-feeding How should I use this medicine? This drug is usually given as an infusion into a vein. It is administered in a hospital or clinic by a specially trained health care professional. Talk to your pediatrician regarding the use of this medicine in children. Special care may be needed. Overdosage: If you think you have taken too much of this medicine contact a poison control center or emergency room at once. NOTE: This medicine is only for you. Do not share this medicine with others. What if I miss a dose? It is important not to miss a dose. Call your doctor or health care professional if you are unable to keep an appointment. What may interact with this medicine? -medicines for seizures -medicines to increase blood counts like filgrastim, pegfilgrastim, sargramostim -some antibiotics like amikacin, gentamicin, neomycin, streptomycin, tobramycin -vaccines Talk to your doctor or health care professional before taking any of these  medicines: -acetaminophen -aspirin -ibuprofen -ketoprofen -naproxen This list may not describe all possible interactions. Give your health care provider a list of all the medicines, herbs, non-prescription drugs, or dietary supplements you use. Also tell them if you smoke, drink alcohol, or use illegal drugs. Some items may interact with your medicine. What should I watch for while using this medicine? Your condition will be monitored carefully while you are receiving this medicine. You will need important blood work done while you are taking this medicine. This drug may make you feel generally unwell. This is not uncommon, as chemotherapy can affect healthy cells as well as cancer cells. Report any side effects. Continue your course of treatment even though you feel ill unless your doctor tells you to stop. In some cases, you may be given additional medicines to help with side effects. Follow all directions for their use. Call your doctor or health care professional for advice if you get a fever, chills or sore throat, or other symptoms of a cold or flu. Do not treat yourself. This drug   decreases your body's ability to fight infections. Try to avoid being around people who are sick. This medicine may increase your risk to bruise or bleed. Call your doctor or health care professional if you notice any unusual bleeding. Be careful brushing and flossing your teeth or using a toothpick because you may get an infection or bleed more easily. If you have any dental work done, tell your dentist you are receiving this medicine. Avoid taking products that contain aspirin, acetaminophen, ibuprofen, naproxen, or ketoprofen unless instructed by your doctor. These medicines may hide a fever. Do not become pregnant while taking this medicine. Women should inform their doctor if they wish to become pregnant or think they might be pregnant. There is a potential for serious side effects to an unborn child. Talk to  your health care professional or pharmacist for more information. Do not breast-feed an infant while taking this medicine. What side effects may I notice from receiving this medicine? Side effects that you should report to your doctor or health care professional as soon as possible: -allergic reactions like skin rash, itching or hives, swelling of the face, lips, or tongue -signs of infection - fever or chills, cough, sore throat, pain or difficulty passing urine -signs of decreased platelets or bleeding - bruising, pinpoint red spots on the skin, black, tarry stools, nosebleeds -signs of decreased red blood cells - unusually weak or tired, fainting spells, lightheadedness -breathing problems -changes in hearing -changes in vision -chest pain -high blood pressure -low blood counts - This drug may decrease the number of white blood cells, red blood cells and platelets. You may be at increased risk for infections and bleeding. -nausea and vomiting -pain, swelling, redness or irritation at the injection site -pain, tingling, numbness in the hands or feet -problems with balance, talking, walking -trouble passing urine or change in the amount of urine Side effects that usually do not require medical attention (report to your doctor or health care professional if they continue or are bothersome): -hair loss -loss of appetite -metallic taste in the mouth or changes in taste This list may not describe all possible side effects. Call your doctor for medical advice about side effects. You may report side effects to FDA at 1-800-FDA-1088. Where should I keep my medicine? This drug is given in a hospital or clinic and will not be stored at home. NOTE: This sheet is a summary. It may not cover all possible information. If you have questions about this medicine, talk to your doctor, pharmacist, or health care provider.  2018 Elsevier/Gold Standard (2007-12-25 14:38:05)  

## 2018-05-10 NOTE — Anesthesia Pain Management Evaluation Note (Signed)
SPOKE WITH DR Randa Lynn RE PATIENT BMI 57.06 AND OK TO PROCEED AT [facility] WITH PORT PLACEMENT 05/11/18

## 2018-05-11 ENCOUNTER — Ambulatory Visit
Admission: RE | Admit: 2018-05-11 | Discharge: 2018-05-11 | Disposition: A | Discharge status: Home / Self Care | Payer: BLUE CROSS/BLUE SHIELD | Source: Ambulatory Visit | Attending: General Surgery | Admitting: General Surgery

## 2018-05-11 ENCOUNTER — Encounter
Admission: RE | Disposition: A | Discharge status: Home / Self Care | Payer: Self-pay | Source: Ambulatory Visit | Attending: General Surgery

## 2018-05-11 ENCOUNTER — Ambulatory Visit: Payer: BLUE CROSS/BLUE SHIELD

## 2018-05-11 ENCOUNTER — Other Ambulatory Visit: Payer: Self-pay

## 2018-05-11 ENCOUNTER — Ambulatory Visit: Payer: BLUE CROSS/BLUE SHIELD | Admitting: Anesthesiology

## 2018-05-11 DIAGNOSIS — E669 Obesity, unspecified: Secondary | ICD-10-CM | POA: Diagnosis not present

## 2018-05-11 DIAGNOSIS — Z885 Allergy status to narcotic agent status: Secondary | ICD-10-CM | POA: Insufficient documentation

## 2018-05-11 DIAGNOSIS — Z7982 Long term (current) use of aspirin: Secondary | ICD-10-CM | POA: Insufficient documentation

## 2018-05-11 DIAGNOSIS — I4891 Unspecified atrial fibrillation: Secondary | ICD-10-CM | POA: Insufficient documentation

## 2018-05-11 DIAGNOSIS — C50511 Malignant neoplasm of lower-outer quadrant of right female breast: Secondary | ICD-10-CM

## 2018-05-11 DIAGNOSIS — Z6841 Body Mass Index (BMI) 40.0 and over, adult: Secondary | ICD-10-CM | POA: Insufficient documentation

## 2018-05-11 DIAGNOSIS — E039 Hypothyroidism, unspecified: Secondary | ICD-10-CM | POA: Insufficient documentation

## 2018-05-11 DIAGNOSIS — C50911 Malignant neoplasm of unspecified site of right female breast: Secondary | ICD-10-CM | POA: Insufficient documentation

## 2018-05-11 DIAGNOSIS — Z79899 Other long term (current) drug therapy: Secondary | ICD-10-CM | POA: Insufficient documentation

## 2018-05-11 DIAGNOSIS — M810 Age-related osteoporosis without current pathological fracture: Secondary | ICD-10-CM | POA: Insufficient documentation

## 2018-05-11 DIAGNOSIS — I081 Rheumatic disorders of both mitral and tricuspid valves: Secondary | ICD-10-CM | POA: Insufficient documentation

## 2018-05-11 DIAGNOSIS — Z95828 Presence of other vascular implants and grafts: Secondary | ICD-10-CM

## 2018-05-11 DIAGNOSIS — Z91010 Allergy to peanuts: Secondary | ICD-10-CM | POA: Insufficient documentation

## 2018-05-11 DIAGNOSIS — M1990 Unspecified osteoarthritis, unspecified site: Secondary | ICD-10-CM | POA: Insufficient documentation

## 2018-05-11 DIAGNOSIS — I10 Essential (primary) hypertension: Secondary | ICD-10-CM | POA: Insufficient documentation

## 2018-05-11 DIAGNOSIS — Z171 Estrogen receptor negative status [ER-]: Secondary | ICD-10-CM

## 2018-05-11 HISTORY — PX: PORTACATH PLACEMENT: SHX2246

## 2018-05-11 SURGERY — INSERTION, TUNNELED CENTRAL VENOUS DEVICE, WITH PORT
Anesthesia: General | Laterality: Right | Wound class: Clean

## 2018-05-11 MED ORDER — FAMOTIDINE 20 MG PO TABS
20.0000 mg | ORAL_TABLET | Freq: Once | ORAL | Status: AC
  Administered 2018-05-11: 20 mg via ORAL

## 2018-05-11 MED ORDER — SODIUM CHLORIDE 0.9 % IJ SOLN
INTRAMUSCULAR | Status: AC
  Filled 2018-05-11: qty 50

## 2018-05-11 MED ORDER — FAMOTIDINE 20 MG PO TABS
ORAL_TABLET | ORAL | Status: AC
  Administered 2018-05-11: 20 mg via ORAL
  Filled 2018-05-11: qty 1

## 2018-05-11 MED ORDER — LIDOCAINE 1 % OPTIME INJ - NO CHARGE
INTRAMUSCULAR | Status: DC | PRN
  Administered 2018-05-11: 10 mL

## 2018-05-11 MED ORDER — MEPERIDINE HCL 50 MG/ML IJ SOLN: 6.2500 mg | INTRAMUSCULAR | Status: DC | PRN

## 2018-05-11 MED ORDER — FENTANYL CITRATE (PF) 100 MCG/2ML IJ SOLN: 25.0000 ug | INTRAMUSCULAR | Status: DC | PRN

## 2018-05-11 MED ORDER — PROPOFOL 500 MG/50ML IV EMUL
INTRAVENOUS | Status: DC | PRN
  Administered 2018-05-11: 20 ug via INTRAVENOUS

## 2018-05-11 MED ORDER — FENTANYL CITRATE (PF) 100 MCG/2ML IJ SOLN
INTRAMUSCULAR | Status: AC
  Filled 2018-05-11: qty 2

## 2018-05-11 MED ORDER — SEVOFLURANE IN SOLN
RESPIRATORY_TRACT | Status: AC
  Filled 2018-05-11: qty 250

## 2018-05-11 MED ORDER — PROPOFOL 10 MG/ML IV BOLUS
INTRAVENOUS | Status: AC
  Filled 2018-05-11: qty 20

## 2018-05-11 MED ORDER — LIDOCAINE HCL (CARDIAC) PF 100 MG/5ML IV SOSY
PREFILLED_SYRINGE | INTRAVENOUS | Status: DC | PRN
  Administered 2018-05-11: 150 mg via INTRAVENOUS

## 2018-05-11 MED ORDER — CEFAZOLIN SODIUM-DEXTROSE 2-4 GM/100ML-% IV SOLN
INTRAVENOUS | Status: AC
  Filled 2018-05-11: qty 100

## 2018-05-11 MED ORDER — LACTATED RINGERS IV SOLN
INTRAVENOUS | Status: DC
  Administered 2018-05-11: 09:00:00 via INTRAVENOUS

## 2018-05-11 MED ORDER — PROPOFOL 500 MG/50ML IV EMUL
INTRAVENOUS | Status: DC | PRN
  Administered 2018-05-11: 125 ug/kg/min via INTRAVENOUS

## 2018-05-11 MED ORDER — PROMETHAZINE HCL 25 MG/ML IJ SOLN: 6.2500 mg | INTRAMUSCULAR | Status: DC | PRN

## 2018-05-11 MED ORDER — SODIUM CHLORIDE 0.9 % IJ SOLN
INTRAMUSCULAR | Status: DC | PRN
  Administered 2018-05-11: 10 mL via INTRAVENOUS

## 2018-05-11 MED ORDER — LIDOCAINE HCL (PF) 1 % IJ SOLN
INTRAMUSCULAR | Status: AC
  Filled 2018-05-11: qty 30

## 2018-05-11 SURGICAL SUPPLY — 28 items
BLADE SURG 15 STRL SS SAFETY (BLADE) ×2 IMPLANT
CHLORAPREP W/TINT 26ML (MISCELLANEOUS) ×2 IMPLANT
COVER LIGHT HANDLE STERIS (MISCELLANEOUS) ×4 IMPLANT
DECANTER SPIKE VIAL GLASS SM (MISCELLANEOUS) ×4 IMPLANT
DRAPE C-ARM XRAY 36X54 (DRAPES) ×2 IMPLANT
DRAPE LAPAROTOMY TRNSV 106X77 (MISCELLANEOUS) ×2 IMPLANT
DRSG TEGADERM 2-3/8X2-3/4 SM (GAUZE/BANDAGES/DRESSINGS) ×2 IMPLANT
DRSG TEGADERM 4X4.75 (GAUZE/BANDAGES/DRESSINGS) ×2 IMPLANT
DRSG TELFA 4X3 1S NADH ST (GAUZE/BANDAGES/DRESSINGS) ×2 IMPLANT
ELECT REM PT RETURN 9FT ADLT (ELECTROSURGICAL) ×2
ELECTRODE REM PT RTRN 9FT ADLT (ELECTROSURGICAL) ×1 IMPLANT
GLOVE BIO SURGEON STRL SZ7.5 (GLOVE) ×2 IMPLANT
GLOVE INDICATOR 8.0 STRL GRN (GLOVE) ×2 IMPLANT
GOWN STRL REUS W/ TWL LRG LVL3 (GOWN DISPOSABLE) ×2 IMPLANT
GOWN STRL REUS W/TWL LRG LVL3 (GOWN DISPOSABLE) ×2
KIT PORT POWER 8FR ISP CVUE (Port) ×2 IMPLANT
KIT TURNOVER KIT A (KITS) ×2 IMPLANT
LABEL OR SOLS (LABEL) ×2 IMPLANT
NS IRRIG 500ML POUR BTL (IV SOLUTION) ×2 IMPLANT
PACK PORT-A-CATH (MISCELLANEOUS) ×2 IMPLANT
STRIP CLOSURE SKIN 1/2X4 (GAUZE/BANDAGES/DRESSINGS) ×2 IMPLANT
SUT PROLENE 3 0 SH DA (SUTURE) ×2 IMPLANT
SUT VIC AB 3-0 SH 27 (SUTURE) ×1
SUT VIC AB 3-0 SH 27X BRD (SUTURE) ×1 IMPLANT
SUT VIC AB 4-0 FS2 27 (SUTURE) ×2 IMPLANT
SWABSTK COMLB BENZOIN TINCTURE (MISCELLANEOUS) ×2 IMPLANT
SYR 10ML LL (SYRINGE) ×2 IMPLANT
SYR 10ML SLIP (SYRINGE) ×2 IMPLANT

## 2018-05-11 NOTE — H&P (Signed)
No change in clinical history or exam. For right IJ power port placement.

## 2018-05-11 NOTE — OR Nursing (Signed)
Dr. Bary Castilla in to see pt in postop 1054 am, advises ok to discharge to home.

## 2018-05-11 NOTE — Anesthesia Postprocedure Evaluation (Signed)
Anesthesia Post Note  Patient: Wendy Marsh  Procedure(s) Performed: INSERTION  I.J PORT-A-CATH (Right )  Patient location during evaluation: PACU Anesthesia Type: General Level of consciousness: awake and alert and oriented Pain management: pain level controlled Vital Signs Assessment: post-procedure vital signs reviewed and stable Respiratory status: spontaneous breathing, nonlabored ventilation and respiratory function stable Cardiovascular status: blood pressure returned to baseline and stable Postop Assessment: no signs of nausea or vomiting Anesthetic complications: no     Last Vitals:  Vitals:   05/11/18 1052 05/11/18 1110  BP: (!) 145/50 139/67  Pulse: 62 (!) 59  Resp: 18 18  Temp: 36.4 C (!) 36.2 C  SpO2: 98% 99%    Last Pain:  Vitals:   05/11/18 1110  TempSrc: Temporal  PainSc: 0-No pain                 [name]

## 2018-05-11 NOTE — Anesthesia Post-op Follow-up Note (Signed)
Anesthesia QCDR form completed.        

## 2018-05-11 NOTE — Transfer of Care (Signed)
Immediate Anesthesia Transfer of Care Note  Patient: Wendy Marsh  Procedure(s) Performed: INSERTION  I.J PORT-A-CATH (Right )  Patient Location: PACU  Anesthesia Type:General  Level of Consciousness: awake  Airway & Oxygen Therapy: Patient Spontanous Breathing  Post-op Assessment: Report given to RN  Post vital signs: stable  Last Vitals:  Vitals Value Taken Time  BP 124/58 05/11/2018 10:23 AM  Temp 36.2 C 05/11/2018 10:23 AM  Pulse 65 05/11/2018 10:25 AM  Resp 16 05/11/2018 10:25 AM  SpO2 100 % 05/11/2018 10:25 AM  Vitals shown include unvalidated device data.  Last Pain:  Vitals:   05/11/18 1023  TempSrc:   PainSc: 0-No pain         Complications: No apparent anesthesia complications

## 2018-05-11 NOTE — Discharge Instructions (Signed)

## 2018-05-11 NOTE — Op Note (Signed)
Preoperative diagnosis: Stage II carcinoma the right breast, need for central venous access.  Postoperative diagnosis: Same.  Operative procedure: Right internal jugular PowerPort placement.  Operating Surgeon: Hervey Ard, MD.  Anesthesia: Attended local, 10 cc 1% Xylocaine, plain.  Estimated blood loss: Minimal.  Clinical note: This 62 year old woman presented with a stage II carcinoma of the right breast.  She is a candidate for neoadjuvant chemotherapy.  Central venous access was requested by the treating oncologist.  Operative note: The patient received Kefzol prior to the procedure.  The right neck and chest was cleansed with ChloraPrep and draped.  Ultrasound was used to confirm patency of the right internal jugular vein.  This was then accessed after installation of local anesthesia.  The guidewire was passed followed by the dilator.  The catheter was positioned near the junction of the SVC and right atrium.  It was tunneled to a pocket on the far medial right chest to provide some backstop for compression and access.  The port was anchored with two-point fixation of the deep tissue with 3-0 Prolene sutures.  The wound was closed with a running 3-0 Vicryl suture to the adipose layer and a running 4-0 Vicryl subcuticular suture for the skin.  The port was accessed and easily aspirated and irrigated.  Benzoin Steri-Strips followed by Telfa and Tegaderm dressings applied.  Erect portable chest x-ray obtained in the recovery room showed the port positioning as described above.  She tolerated the procedure well.

## 2018-05-11 NOTE — Anesthesia Preprocedure Evaluation (Addendum)
Anesthesia Evaluation  Patient identified by MRN, date of birth, ID band Patient awake    Reviewed: Allergy & Precautions, NPO status , Patient's Chart, lab work & pertinent test results, reviewed documented beta blocker date and time   History of Anesthesia Complications Negative for: history of anesthetic complications  Airway Mallampati: III  TM Distance: >3 FB Neck ROM: Full    Dental  (+) Partial Lower, Poor Dentition   Pulmonary neg pulmonary ROS, neg sleep apnea, neg COPD,    breath sounds clear to auscultation- rhonchi (-) wheezing      Cardiovascular hypertension, Pt. on medications (-) CAD, (-) Past MI, (-) Cardiac Stents and (-) CABG + dysrhythmias Atrial Fibrillation  Rhythm:Regular Rate:Normal - Systolic murmurs and - Diastolic murmurs Echo 04/03/62: NORMAL LEFT VENTRICULAR SYSTOLIC FUNCTION WITH MILD LVH NORMAL RIGHT VENTRICULAR SYSTOLIC FUNCTION MILD VALVULAR REGURGITATION (mild MR, mild TR) NO VALVULAR STENOSIS EF >55%   Neuro/Psych negative neurological ROS  negative psych ROS   GI/Hepatic negative GI ROS, Neg liver ROS,   Endo/Other  neg diabetesHypothyroidism   Renal/GU negative Renal ROS     Musculoskeletal  (+) Arthritis ,   Abdominal (+) + obese,   Peds  Hematology negative hematology ROS (+)   Anesthesia Other Findings Past Medical History: No date: Arthritis No date: Atrial fibrillation (HCC) No date: Dysrhythmia     Comment:  afib No date: Hyperlipidemia No date: Hypertension No date: Hypothyroidism No date: Lumbago No date: Osteoporosis No date: Thyroid disease   Reproductive/Obstetrics                            Anesthesia Physical Anesthesia Plan  ASA: III  Anesthesia Plan: General   Post-op Pain Management:    Induction: Intravenous  PONV Risk Score and Plan: 2 and Propofol infusion  Airway Management Planned: Natural Airway  Additional  Equipment:   Intra-op Plan:   Post-operative Plan:   Informed Consent: I have reviewed the patients History and Physical, chart, labs and discussed the procedure including the risks, benefits and alternatives for the proposed anesthesia with the patient or authorized representative who has indicated his/her understanding and acceptance.   Dental advisory given  Plan Discussed with: CRNA and Anesthesiologist  Anesthesia Plan Comments:         Anesthesia Quick Evaluation

## 2018-05-14 ENCOUNTER — Encounter
Admission: RE | Admit: 2018-05-14 | Discharge: 2018-05-14 | Disposition: A | Discharge status: Home / Self Care | Payer: BLUE CROSS/BLUE SHIELD | Source: Ambulatory Visit | Attending: Oncology | Admitting: Oncology

## 2018-05-14 ENCOUNTER — Telehealth: Payer: Self-pay | Admitting: *Deleted

## 2018-05-14 DIAGNOSIS — C50511 Malignant neoplasm of lower-outer quadrant of right female breast: Secondary | ICD-10-CM | POA: Insufficient documentation

## 2018-05-14 MED ORDER — TECHNETIUM TC 99M-LABELED RED BLOOD CELLS IV KIT
20.0000 | PACK | Freq: Once | INTRAVENOUS | Status: AC | PRN
  Administered 2018-05-14: 23 via INTRAVENOUS

## 2018-05-14 NOTE — Telephone Encounter (Signed)
Patient called in and states she have bluster where the dressing is. Advised to take dressing off. Don't need to recover.

## 2018-05-15 ENCOUNTER — Encounter: Payer: Self-pay | Admitting: *Deleted

## 2018-05-15 NOTE — Progress Notes (Signed)
  Oncology Nurse Navigator Documentation  Navigator Location: [facility] (05/15/18 0900)   )Navigator Encounter Type: Telephone (05/15/18 0900) Telephone: Lahoma Crocker Call (05/15/18 0900)                       Barriers/Navigation Needs: Education;Coordination of Care (05/15/18 0900)                          Time Spent with Patient: 15 (05/15/18 0900)   Called patient per request of Dr. Tasia Catchings and reviewed her MUGA results and need for cardiology consult.  She is to expect a phone call with an appointment.  States Dr. Nehemiah Massed is her cardiologist.  Informed patient her chemo is on hold for tomorrow.  She is to call with any questions or needs.

## 2018-05-16 ENCOUNTER — Inpatient Hospital Stay: Payer: BLUE CROSS/BLUE SHIELD

## 2018-05-16 ENCOUNTER — Inpatient Hospital Stay: Payer: BLUE CROSS/BLUE SHIELD | Admitting: Oncology

## 2018-05-16 ENCOUNTER — Other Ambulatory Visit: Payer: Self-pay | Admitting: Oncology

## 2018-05-17 ENCOUNTER — Telehealth: Payer: Self-pay | Admitting: *Deleted

## 2018-05-17 ENCOUNTER — Encounter: Payer: Self-pay | Admitting: *Deleted

## 2018-05-17 ENCOUNTER — Other Ambulatory Visit: Payer: Self-pay | Admitting: Oncology

## 2018-05-17 ENCOUNTER — Inpatient Hospital Stay: Payer: BLUE CROSS/BLUE SHIELD

## 2018-05-17 NOTE — Telephone Encounter (Signed)
Patient called stating that she needs to have an US done and would like for nurse to call her back so she can explain what she needs. [phone]

## 2018-05-17 NOTE — Telephone Encounter (Signed)
Per Dr Tasia Catchings, okay for patient to have Echo done by cardiologist on 8/28, Dr Tasia Catchings will notify us of new treatment plan as soon as possible.  Per Vita Barley, she would call pt to notify her of this information.

## 2018-05-17 NOTE — Progress Notes (Signed)
  Oncology Nurse Navigator Documentation  Navigator Location: [facility] (05/17/18 1500)   )Navigator Encounter Type: Telephone (05/17/18 1500) Telephone: Incoming Call (05/17/18 1500)                       Barriers/Navigation Needs: Coordination of Care (05/17/18 1500)                          Time Spent with Patient: 30 (05/17/18 1500)   Patient called today.  States she saw her cardiologist, Dr. Nehemiah Massed today and he is recommending an ECHO.  She is concerned that it can't be done until the 28th and wanted me to check with Dr. Tasia Catchings.  Discussed with Geraldine Solar to ask Dr. Tasia Catchings if this would be ok, or did it need to be sooner.  Per Almyra Free, Dr. Tasia Catchings said ok for ECHO on the 28th and to anticipate change in treatment plan.  Requested follow up appointment to be scheduled with Dr. Tasia Catchings after the 28th.  Patient informed.  She is to call with any questions or needs.

## 2018-05-18 MED ORDER — LIDOCAINE-PRILOCAINE 2.5-2.5 % EX CREA: TOPICAL_CREAM | CUTANEOUS | 3 refills | Status: DC

## 2018-05-18 MED ORDER — DEXAMETHASONE 4 MG PO TABS: 8.0000 mg | ORAL_TABLET | Freq: Two times a day (BID) | ORAL | 1 refills | Status: DC

## 2018-05-18 MED ORDER — ONDANSETRON HCL 8 MG PO TABS: 8.0000 mg | ORAL_TABLET | Freq: Two times a day (BID) | ORAL | 1 refills | Status: DC | PRN

## 2018-05-18 MED ORDER — PROCHLORPERAZINE MALEATE 10 MG PO TABS: 10.0000 mg | ORAL_TABLET | Freq: Four times a day (QID) | ORAL | 1 refills | Status: DC | PRN

## 2018-05-18 NOTE — Progress Notes (Signed)
DISCONTINUE ON PATHWAY REGIMEN - Breast     A cycle is every 14 days (cycles 1-4):     Doxorubicin      Cyclophosphamide      Pegfilgrastim-xxxx    A cycle is every 21 days (cycles 5-8):     Paclitaxel      Carboplatin   **Always confirm dose/schedule in your pharmacy ordering system**  REASON: Toxicities / Adverse Event PRIOR TREATMENT: [id number]: Dose-Dense AC [Doxorubicin + Cyclophosphamide q14 Days x 4 Cycles], Followed by Paclitaxel 80 mg/m2 Weekly + Carboplatin AUC=6 q21 Days x 12 Weeks TREATMENT RESPONSE: Unable to Evaluate  START OFF PATHWAY REGIMEN - Breast   [id number]:Carboplatin + Docetaxel (6/75) every 21 days:   A cycle is every 21 days:     Docetaxel      Carboplatin   **Always confirm dose/schedule in your pharmacy ordering system**  Patient Characteristics: Preoperative or Nonsurgical Candidate (Clinical Staging), Neoadjuvant Therapy followed by Surgery, Invasive Disease, Chemotherapy, HER2 Negative/Unknown/Equivocal, ER Negative/Unknown, Platinum Therapy Indicated Therapeutic Status: Preoperative or Nonsurgical Candidate (Clinical Staging) AJCC M Category: cM0 AJCC Grade: G3 Breast Surgical Plan: Neoadjuvant Therapy followed by Surgery ER Status: Negative (-) AJCC 8 Stage Grouping: IIB HER2 Status: Negative (-) AJCC T Category: cT2 AJCC N Category: cN0 PR Status: Negative (-) Type of Therapy: Platinum Therapy Indicated Intent of Therapy: Curative Intent, Discussed with Patient

## 2018-05-18 NOTE — Addendum Note (Signed)
Addended by: Earlie Server on: 05/18/2018 11:25 AM   Modules accepted: Orders

## 2018-05-22 ENCOUNTER — Inpatient Hospital Stay: Payer: BLUE CROSS/BLUE SHIELD

## 2018-05-22 ENCOUNTER — Telehealth: Payer: Self-pay | Admitting: Genetic Counselor

## 2018-05-22 ENCOUNTER — Other Ambulatory Visit: Payer: Self-pay

## 2018-05-22 ENCOUNTER — Encounter: Payer: Self-pay | Admitting: Oncology

## 2018-05-22 ENCOUNTER — Inpatient Hospital Stay (HOSPITAL_BASED_OUTPATIENT_CLINIC_OR_DEPARTMENT_OTHER): Payer: BLUE CROSS/BLUE SHIELD | Admitting: Oncology

## 2018-05-22 ENCOUNTER — Other Ambulatory Visit: Payer: Self-pay | Admitting: Oncology

## 2018-05-22 VITALS — BP 148/76 | HR 67 | Temp 97.2°F | Resp 18 | Wt 304.2 lb

## 2018-05-22 VITALS — BP 137/80 | HR 60 | Resp 20

## 2018-05-22 DIAGNOSIS — M818 Other osteoporosis without current pathological fracture: Secondary | ICD-10-CM

## 2018-05-22 DIAGNOSIS — E876 Hypokalemia: Secondary | ICD-10-CM | POA: Diagnosis not present

## 2018-05-22 DIAGNOSIS — Z803 Family history of malignant neoplasm of breast: Secondary | ICD-10-CM

## 2018-05-22 DIAGNOSIS — I501 Left ventricular failure: Secondary | ICD-10-CM

## 2018-05-22 DIAGNOSIS — C50511 Malignant neoplasm of lower-outer quadrant of right female breast: Secondary | ICD-10-CM | POA: Diagnosis not present

## 2018-05-22 DIAGNOSIS — E039 Hypothyroidism, unspecified: Secondary | ICD-10-CM

## 2018-05-22 DIAGNOSIS — Z5111 Encounter for antineoplastic chemotherapy: Secondary | ICD-10-CM

## 2018-05-22 DIAGNOSIS — C50919 Malignant neoplasm of unspecified site of unspecified female breast: Secondary | ICD-10-CM

## 2018-05-22 LAB — COMPREHENSIVE METABOLIC PANEL
ALK PHOS: 68 U/L (ref 38–126)
ALT: 18 U/L (ref 0–44)
AST: 19 U/L (ref 15–41)
Albumin: 3.4 g/dL — ABNORMAL LOW (ref 3.5–5.0)
Anion gap: 12 (ref 5–15)
BILIRUBIN TOTAL: 0.5 mg/dL (ref 0.3–1.2)
BUN: 14 mg/dL (ref 8–23)
CO2: 27 mmol/L (ref 22–32)
CREATININE: 0.75 mg/dL (ref 0.44–1.00)
Calcium: 9.4 mg/dL (ref 8.9–10.3)
Chloride: 100 mmol/L (ref 98–111)
GFR calc Af Amer: >60 mL/min (ref >60)
GFR calc non Af Amer: >60 mL/min (ref >60)
GLUCOSE: 119 mg/dL — AB (ref 70–99)
POTASSIUM: 3.3 mmol/L — AB (ref 3.5–5.1)
Sodium: 139 mmol/L (ref 135–145)
TOTAL PROTEIN: 7.4 g/dL (ref 6.5–8.1)

## 2018-05-22 LAB — CBC WITH DIFFERENTIAL/PLATELET
BASOS ABS: 0 10*3/uL (ref 0–0.1)
Basophils Relative: 1 %
EOS PCT: 3 %
Eosinophils Absolute: 0.2 10*3/uL (ref 0–0.7)
HCT: 35.6 % (ref 35.0–47.0)
Hemoglobin: 11.8 g/dL — ABNORMAL LOW (ref 12.0–16.0)
LYMPHS PCT: 17 %
Lymphs Abs: 1.4 10*3/uL (ref 1.0–3.6)
MCH: 28 pg (ref 26.0–34.0)
MCHC: 33 g/dL (ref 32.0–36.0)
MCV: 84.9 fL (ref 80.0–100.0)
MONO ABS: 0.6 10*3/uL (ref 0.2–0.9)
Monocytes Relative: 7 %
Neutro Abs: 5.8 10*3/uL (ref 1.4–6.5)
Neutrophils Relative %: 72 %
PLATELETS: 303 10*3/uL (ref 150–440)
RBC: 4.19 MIL/uL (ref 3.80–5.20)
RDW: 14.7 % — ABNORMAL HIGH (ref 11.5–14.5)
WBC: 8.1 10*3/uL (ref 3.6–11.0)

## 2018-05-22 MED ORDER — HEPARIN SOD (PORK) LOCK FLUSH 100 UNIT/ML IV SOLN
500.0000 [IU] | Freq: Once | INTRAVENOUS | Status: AC
  Administered 2018-05-22: 500 [IU] via INTRAVENOUS
  Filled 2018-05-22: qty 5

## 2018-05-22 MED ORDER — SODIUM CHLORIDE 0.9 % IV SOLN
Freq: Once | INTRAVENOUS | Status: AC
  Administered 2018-05-22: 12:00:00 via INTRAVENOUS
  Filled 2018-05-22: qty 250

## 2018-05-22 MED ORDER — POTASSIUM CHLORIDE ER 10 MEQ PO TBCR: 10.0000 meq | EXTENDED_RELEASE_TABLET | Freq: Every day | ORAL | 0 refills | Status: DC

## 2018-05-22 MED ORDER — SODIUM CHLORIDE 0.9 % IV SOLN
750.0000 mg | Freq: Once | INTRAVENOUS | Status: AC
  Administered 2018-05-22: 750 mg via INTRAVENOUS
  Filled 2018-05-22: qty 75

## 2018-05-22 MED ORDER — SODIUM CHLORIDE 0.9% FLUSH
10.0000 mL | Freq: Once | INTRAVENOUS | Status: AC
  Administered 2018-05-22: 10 mL via INTRAVENOUS
  Filled 2018-05-22: qty 10

## 2018-05-22 MED ORDER — SODIUM CHLORIDE 0.9 % IV SOLN: 10.0000 mg | Freq: Once | INTRAVENOUS | Status: DC

## 2018-05-22 MED ORDER — DEXAMETHASONE SODIUM PHOSPHATE 10 MG/ML IJ SOLN
10.0000 mg | Freq: Once | INTRAMUSCULAR | Status: AC
  Administered 2018-05-22: 10 mg via INTRAVENOUS
  Filled 2018-05-22: qty 1

## 2018-05-22 MED ORDER — PALONOSETRON HCL INJECTION 0.25 MG/5ML
0.2500 mg | Freq: Once | INTRAVENOUS | Status: AC
  Administered 2018-05-22: 0.25 mg via INTRAVENOUS
  Filled 2018-05-22: qty 5

## 2018-05-22 MED ORDER — SODIUM CHLORIDE 0.9 % IV SOLN
75.0000 mg/m2 | Freq: Once | INTRAVENOUS | Status: AC
  Administered 2018-05-22: 140 mg via INTRAVENOUS
  Filled 2018-05-22: qty 14

## 2018-05-22 NOTE — Progress Notes (Signed)
Patient here for follow up. Will get first chemo treatment today. Pt wants to know whether aspirin should be restarted.

## 2018-05-22 NOTE — Progress Notes (Signed)
To use ABW of 188lbs, BSA ~1.93 for taxotere dose per MD.

## 2018-05-22 NOTE — Telephone Encounter (Signed)
Cancer Genetics             Telegenetics Results Disclosure   Patient Name: Wendy Marsh Patient DOB: 1956/09/16 Patient Age: 62 y.o. Phone Call Date: 05/22/2018  Referring Provider: Earlie Server, MD    Ms. Vahle was called today to discuss genetic test results. Please see the Genetics telephone note from 05/08/18 for a detailed discussion of her personal and family histories and the recommendations provided. Ms. Benthall corrected information regarding her family history. Her sister who had breast cancer at age 1 was actually a paternal half-sister rather than a full sister.  Genetic Testing: At the time of Ms. Schoene's telegenetics visit, she decided to pursue genetic testing of multiple genes associated with hereditary susceptibility to cancer. Testing included sequencing and deletion/duplication analysis. Testing did not reveal a pathogenic mutation in any of the genes analyzed.  A copy of the genetic test report will be scanned into Epic under the Media tab.  The genes analyzed were the 84 genes on [facility]'s Multi-Cancer panel (AIP, ALK, APC, ATM, AXIN2, BAP1, BARD1, BLM, BMPR1A, BRCA1, BRCA2, BRIP1, CASR, CDC73, CDH1, CDK4, CDKN1B, CDKN1C, CDKN2A, CEBPA, CHEK2, CTNNA1, DICER1, DIS3L2, EGFR, EPCAM, FH, FLCN, GATA2, GPC3, GREM1, HOXB13, HRAS, KIT, MAX, MEN1, MET, MITF, MLH1, MSH2, MSH3, MSH6, MUTYH, NBN, NF1, NF2, NTHL1, PALB2, PDGFRA, PHOX2B, PMS2, POLD1, POLE, POT1, PRKAR1A, PTCH1, PTEN, RAD50, RAD51C, RAD51D, RB1, RECQL4, RET, RUNX1, SDHA, SDHAF2, SDHB, SDHC, SDHD, SMAD4, SMARCA4, SMARCB1, SMARCE1, STK11, SUFU, TERC, TERT, TMEM127, TP53, TSC1, TSC2, VHL, WRN, WT1).  Since the current test is not perfect, it is possible that there may be a gene mutation that current testing cannot detect, but that chance is small. It is possible that a different genetic factor, which has not yet been discovered or is not on this panel, is responsible for the cancer diagnoses  in the family. Again, the likelihood of this is low. Lastly, it may be that there is detectable mutation in her family, such as in her paternal half-sister, that Ms. Tung did not inherit. No additional testing is recommended at this time for Ms. Zenor.  A Variant of Uncertain Significance was detected: SUFU c.992G>A (p.[id number]). This is still considered a normal result. While at this time, it is unknown if this finding is associated with increased cancer risk, the majority of these variants get reclassified to be inconsequential. Medical management should not be based on this finding. With time, we suspect the lab will determine the significance, if any. If we do learn more about it, we will try to contact Ms. Arcos to discuss it further. It is important to stay in touch with [place] periodically and keep the address and phone number up to date.  Cancer Screening: These results suggest that Ms. Welsch cancer was most likely not due to an inherited predisposition. Most cancers happen by chance and this test, along with details of her family history, suggests that her cancer falls into this category. She is recommended to follow the cancer screening guidelines provided by her physician.   Family Members: Family members are at some increased risk of developing cancer, over the general population risk, simply due to the family history. They are recommended to speak with their own providers about appropriate cancer screenings.   Any relative who had cancer at a young age or had a particularly rare cancer may also wish to pursue  genetic testing. This is particularly recommended for her paternal half-sister with breast cancer. Genetic counselors can be located in other cities, by visiting the website of the [employer] ([employer]) and Field seismologist for a Dietitian by zip code.   Family members are not recommended to get tested for the above VUS outside of a research  protocol as this finding has no implications for their medical management.    Lastly, cancer genetics is a rapidly advancing field and it is possible that new genetic tests will be appropriate for Ms. Ronk in the future. Ms. Lieber is encouraged to remain in contact with Genetics on an annual basis so we can update her personal and family histories, and let her know of advances in cancer genetics that may benefit the family. Ms. Stair questions were answered to her satisfaction today, and she knows she is welcome to call anytime with additional questions.    Steele Berg, MS, [employer] Certified Genetic Counselor phone: [phone]

## 2018-05-22 NOTE — Progress Notes (Signed)
Hematology/Oncology Consult note [facility] Telephone:([phone] [phone]   Patient Care Team: Valerie Roys, DO as PCP - General (Family Medicine) Thornton Park, MD as Referring Physician (Orthopedic Surgery)  REFERRING PROVIDER: Valerie Roys, DO CHIEF COMPLAINTS/REASON FOR VISIT:  Evaluation of Breast cancer  HISTORY OF PRESENTING ILLNESS:  Wendy Marsh is a  62 y.o.  female with PMH listed below who was referred to me for evaluation of breast cancer Patient report feeling right breast mass for about 1 month. She mentioned to primary care provider and had diagnostic mammogram Mammogram showed right breast 6 o'clock axis breast mass, 2.9 cm corresponding to area of concern.  [place] was performed showed irregular hypoechoic mass in right breast Right axilla shows no enlarged or morphologically abnormal lymph nodes.  Breast mass was biopsied, and pathology showed invasive mammary carcinoma.  Grade 3 , ER/PR- HER2 -.  LVI suspicious, favor present.   Nipple discharge: denies.  Breast Skin changes, denies Family history: sister has history of breast cancer, father deceased from esophageal cancer. ? M grandmother breast cancer.   History of radiation to chest: denies.  Previous breast surgery:   Patient presents with multiple family members including husband, three daughters, and son in Sports coach.  She walks with a walker. Chronic right knee pain due to osteoarthritis. Paroxymal A fib, on metoprolol for rate control.  Asprin '81mg'$  daily  INTERVAL HISTORY Wendy Marsh is a 62 y.o. female who has above history reviewed by me today presents for follow up visit for management of breast cancer. During the interval she has been to chemotherapy class education and had medi port placed.  She has had baseline MUGA testing done and reviewed systolic function depression, LVEF 45%.  Patient was referred to cardiology for further  evaluation.    Review of Systems  Constitutional: Negative for chills, fever, malaise/fatigue and weight loss.  HENT: Negative for nosebleeds and sore throat.   Eyes: Negative for double vision, photophobia and redness.  Respiratory: Negative for cough, shortness of breath and wheezing.   Cardiovascular: Negative for chest pain, palpitations and orthopnea.  Gastrointestinal: Negative for abdominal pain, blood in stool, nausea and vomiting.  Genitourinary: Negative for dysuria.  Musculoskeletal: Positive for joint pain. Negative for back pain, myalgias and neck pain.       Chronic right knee pain.  Skin: Negative for itching and rash.  Neurological: Negative for dizziness, tingling and tremors.  Endo/Heme/Allergies: Negative for environmental allergies. Does not bruise/bleed easily.  Psychiatric/Behavioral: Negative for depression.    MEDICAL HISTORY:  Past Medical History:  Diagnosis Date  . Arthritis   . Atrial fibrillation ([place])   . Dysrhythmia    afib  . Hyperlipidemia   . Hypertension   . Hypothyroidism   . Lumbago   . Osteoporosis   . Thyroid disease     SURGICAL HISTORY: Past Surgical History:  Procedure Laterality Date  . BREAST BIOPSY  05/02/2018  . JOINT REPLACEMENT Left    tkr  . PORTACATH PLACEMENT Right 05/11/2018   Procedure: INSERTION  I.J PORT-A-CATH;  Surgeon: Robert Bellow, MD;  Location: [facility] ORS;  Service: General;  Laterality: Right;  . REPLACEMENT TOTAL KNEE      SOCIAL HISTORY: Social History   Socioeconomic History  . Marital status: Married    Spouse name: jerry  . Number of children: 3  . Years of education: Not on file  . Highest education level: Some college, no degree  Occupational  History  . Not on file  Social Needs  . Financial resource strain: Somewhat hard  . Food insecurity:    Worry: Never true    Inability: Never true  . Transportation needs:    Medical: No    Non-medical: No  Tobacco Use  . Smoking status: Never  Smoker  . Smokeless tobacco: Never Used  Substance and Sexual Activity  . Alcohol use: No  . Drug use: No  . Sexual activity: Yes    Birth control/protection: Post-menopausal  Lifestyle  . Physical activity:    Days per week: 0 days    Minutes per session: 0 min  . Stress: Only a little  Relationships  . Social connections:    Talks on phone: More than three times a week    Gets together: Twice a week    Attends religious service: Never    Active member of club or organization: No    Attends meetings of clubs or organizations: Not on file    Relationship status: Married  . Intimate partner violence:    Fear of current or ex partner: No    Emotionally abused: No    Physically abused: No    Forced sexual activity: No  Other Topics Concern  . Not on file  Social History Narrative  . Not on file    FAMILY HISTORY: Family History  Problem Relation Age of Onset  . Diabetes Mother   . Congestive Heart Failure Mother   . Breast cancer Maternal Grandmother        dx 17s; deceased 33s  . Cancer Maternal Uncle 58       deceased 49s  . Other Father        No information on father or paternal relatives  . Breast cancer Other 54       paternal half-sister; also esophageal ca at 7; currently 67  . Diabetes Other   . Hypertension Other   . Kidney disease Other   . Thyroid disease Other     ALLERGIES:  is allergic to morphine and related and peanut-containing drug products.  MEDICATIONS:  Current Outpatient Medications  Medication Sig Dispense Refill  . EPINEPHrine 0.3 mg/0.3 mL IJ SOAJ injection   12  . flecainide (TAMBOCOR) 50 MG tablet Take 50 mg by mouth daily.     Marland Kitchen levothyroxine (SYNTHROID, LEVOTHROID) 175 MCG tablet Take 1 tablet (175 mcg total) by mouth daily before breakfast. 90 tablet 0  . lidocaine-prilocaine (EMLA) cream Apply to affected area once 30 g 3  . lisinopril-hydrochlorothiazide (PRINZIDE,ZESTORETIC) 20-25 MG tablet Take 2 tablets by mouth daily. 180  tablet 3  . metoprolol succinate (TOPROL-XL) 50 MG 24 hr tablet Take 2 tablets (100 mg total) by mouth daily. Take with or immediately following a meal. 180 tablet 3  . Nutritional Supplements (JUICE PLUS FIBRE PO) Take 2 capsules by mouth daily. Fruit blend    . ondansetron (ZOFRAN) 8 MG tablet Take 1 tablet (8 mg total) by mouth 2 (two) times daily as needed for refractory nausea / vomiting. Start on day 3 after chemo. 30 tablet 1  . prochlorperazine (COMPAZINE) 10 MG tablet Take 1 tablet (10 mg total) by mouth every 6 (six) hours as needed (Nausea or vomiting). 30 tablet 1  . aspirin 81 MG tablet Take 81 mg by mouth daily.    Marland Kitchen dexamethasone (DECADRON) 4 MG tablet Take 2 tablets (8 mg total) by mouth 2 (two) times daily. Then again the day after chemo  for 3 days. (Patient not taking: Reported on 05/22/2018) 30 tablet 1  . naproxen sodium (ALEVE) 220 MG tablet Take 220 mg by mouth daily as needed (pain).     . potassium chloride (K-DUR) 10 MEQ tablet Take 1 tablet (10 mEq total) by mouth daily. 7 tablet 0   No current facility-administered medications for this visit.    Facility-Administered Medications Ordered in Other Visits  Medication Dose Route Frequency Provider Last Rate Last Dose  . heparin lock flush 100 unit/mL  500 Units Intravenous Once Earlie Server, MD         PHYSICAL EXAMINATION: ECOG PERFORMANCE STATUS: 1 - Symptomatic but completely ambulatory Vitals:   05/22/18 1109  BP: (!) 148/76  Pulse: 67  Resp: 18  Temp: (!) 97.2 F (36.2 C)   Filed Weights   05/22/18 1109  Weight: (!) 304 lb 3.2 oz (138 kg)    Physical Exam  Constitutional: She is oriented to person, place, and time. She appears well-developed and well-nourished. No distress.  Obese,   HENT:  Head: Normocephalic and atraumatic.  Right Ear: External ear normal.  Left Ear: External ear normal.  Mouth/Throat: Oropharynx is clear and moist.  Eyes: Pupils are equal, round, and reactive to light. EOM are  normal. No scleral icterus.  Neck: Normal range of motion. Neck supple.  Cardiovascular: Normal rate, regular rhythm and normal heart sounds.  Pulmonary/Chest: Effort normal. No respiratory distress. She has no wheezes.  Abdominal: Soft. Bowel sounds are normal. She exhibits no distension and no mass. There is no tenderness.  Musculoskeletal: Normal range of motion. She exhibits edema. She exhibits no deformity.  Chronic lower extremity edema.  1+  Neurological: She is alert and oriented to person, place, and time. No cranial nerve deficit. Coordination normal.  Skin: Skin is warm and dry. No rash noted. No erythema.  Psychiatric: She has a normal mood and affect. Her behavior is normal. Thought content normal.  Breast exam was performed in seated and lying down position. Right breast palpable 3cm firm fixed mass, 6 o'oclock axis. No evidence of palpable left breast mass.  No palpable axillary lymph nodes bilaterally.     LABORATORY DATA:  I have reviewed the data as listed Lab Results  Component Value Date   WBC 8.1 05/22/2018   HGB 11.8 (L) 05/22/2018   HCT 35.6 05/22/2018   MCV 84.9 05/22/2018   PLT 303 05/22/2018   Recent Labs    03/06/18 1111 05/08/18 1215 05/22/18 1046  NA 139 139 139  K 3.9 3.4* 3.3*  CL 97 100 100  CO2 '26 26 27  '$ GLUCOSE 91 104* 119*  BUN '14 17 14  '$ CREATININE 0.82 0.84 0.75  CALCIUM 9.5 9.3 9.4  GFRNONAA 77 >60 >60  GFRAA 89 >60 >60  PROT 7.3 8.0 7.4  ALBUMIN 3.9 3.8 3.4*  AST '13 23 19  '$ ALT '17 22 18  '$ ALKPHOS 72 68 68  BILITOT 0.3 0.4 0.5      ASSESSMENT & PLAN:  1. Malignant neoplasm of lower-outer quadrant of right female breast, unspecified estrogen receptor status ([place])   2. 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 Hypokalemia   4. Encounter for antineoplastic chemotherapy   5. Heart failure, left, with LVEF 41-49% (HCC)   Cancer Staging Malignant neoplasm of lower-outer quadrant of right female breast [facility]) Staging  form: Breast, AJCC 8th Edition - Clinical stage from 05/08/2018: Stage IIB (cT2, cN0, cM0, G3, ER-, PR-, HER2-) - Signed  by Earlie Server, MD on 05/08/2018  #Triple negative breast cancer, previously plan on DD AC followed by Taxol and carboplatin. Patient's baseline MUGA showed mildly depressed LVEF at 45%. Given that Adriamycin has cardiac toxicity, potentially can lead to irreversible cardiomyopathy, I discussed with patient about using non-anthracycline-containing regimen. Would recommend Docetaxel '75mg'$ /m2 and Carboplatin every 3 weeks for 4-6 cycles. [ data from [place] et al 2018 Clin Cancer Res, Pathological Response and Survival in Triple-Negative Breast Cancer Following Neoadjuvant Carboplatin plus Docetaxel. Stage I-III TNBC patients, Neoadjuvant carboplatin (AUC6) plus docetaxel (75 mg/m2) every 21 days  6 cycles. pCR was 55% and Residual cancer burden (RCB) were 13%. Excellent Three-year RFS was 90% in patients with pCR and 66% in those without pCR]   Given that she is morbidly obese, will use adjusted weight for Docetaxel dosing.  # Growth factor-Udenyca would be given as prophylaxis for chemotherapy-induced neutropenia to prevent febrile neutropenias. Discussed potential side effect- myalgias/arthralgias- recommend Claritin for 4 days.   # Family history of breast cancer plus personal history of triple negative breast cancer. Testing did not reveal a pathogenic mutation in any of the genes analyzed.  BRCA negative.  VUS was detected. Does not change management.   # Hypokalemia, send Rx of K-dur 24mq daily.  Patient and family members have a lot of questions and answer all to their satisfaction.  Orders Placed This Encounter  Procedures  . CBC with Differential/Platelet    Standing Status:   Standing    Number of Occurrences:   20    Standing Expiration Date:   05/23/2019  . Comprehensive metabolic panel    Standing Status:   Standing    Number of Occurrences:   20     Standing Expiration Date:   05/23/2019    All questions were answered. The patient knows to call the clinic with any problems questions or concerns.  Return of visit: day 1 of neoadjuvant chemotherapy.  Total face to face encounter time for this patient visit was 40 min. >50% of the time was  spent in counseling and coordination of care. .Earlie Server MD, PhD Hematology Oncology CVal[facility] Centerat ATotal [facility] Of The NorthlandPager- [phone]/20/2019

## 2018-05-24 ENCOUNTER — Inpatient Hospital Stay: Payer: BLUE CROSS/BLUE SHIELD

## 2018-05-24 DIAGNOSIS — C50511 Malignant neoplasm of lower-outer quadrant of right female breast: Secondary | ICD-10-CM | POA: Diagnosis not present

## 2018-05-24 DIAGNOSIS — C50919 Malignant neoplasm of unspecified site of unspecified female breast: Secondary | ICD-10-CM

## 2018-05-24 MED ORDER — PEGFILGRASTIM-CBQV 6 MG/0.6ML ~~LOC~~ SOSY
6.0000 mg | PREFILLED_SYRINGE | Freq: Once | SUBCUTANEOUS | Status: AC
  Administered 2018-05-24: 6 mg via SUBCUTANEOUS

## 2018-05-28 ENCOUNTER — Inpatient Hospital Stay: Payer: BLUE CROSS/BLUE SHIELD

## 2018-05-28 ENCOUNTER — Telehealth: Payer: Self-pay | Admitting: *Deleted

## 2018-05-28 ENCOUNTER — Inpatient Hospital Stay (HOSPITAL_BASED_OUTPATIENT_CLINIC_OR_DEPARTMENT_OTHER): Payer: BLUE CROSS/BLUE SHIELD | Admitting: Nurse Practitioner

## 2018-05-28 VITALS — BP 122/60 | HR 97 | Temp 97.8°F | Resp 20

## 2018-05-28 DIAGNOSIS — I4891 Unspecified atrial fibrillation: Secondary | ICD-10-CM

## 2018-05-28 DIAGNOSIS — M818 Other osteoporosis without current pathological fracture: Secondary | ICD-10-CM

## 2018-05-28 DIAGNOSIS — R110 Nausea: Secondary | ICD-10-CM

## 2018-05-28 DIAGNOSIS — C50511 Malignant neoplasm of lower-outer quadrant of right female breast: Secondary | ICD-10-CM | POA: Diagnosis not present

## 2018-05-28 DIAGNOSIS — Z5189 Encounter for other specified aftercare: Secondary | ICD-10-CM

## 2018-05-28 DIAGNOSIS — T451X5A Adverse effect of antineoplastic and immunosuppressive drugs, initial encounter: Secondary | ICD-10-CM

## 2018-05-28 DIAGNOSIS — Z171 Estrogen receptor negative status [ER-]: Secondary | ICD-10-CM

## 2018-05-28 DIAGNOSIS — I9589 Other hypotension: Secondary | ICD-10-CM

## 2018-05-28 DIAGNOSIS — E039 Hypothyroidism, unspecified: Secondary | ICD-10-CM

## 2018-05-28 DIAGNOSIS — I10 Essential (primary) hypertension: Secondary | ICD-10-CM

## 2018-05-28 LAB — CBC WITH DIFFERENTIAL/PLATELET
Basophils Absolute: 0 10*3/uL (ref 0–0.1)
Basophils Relative: 0 %
Eosinophils Absolute: 0.1 10*3/uL (ref 0–0.7)
Eosinophils Relative: 2 %
HEMATOCRIT: 37.5 % (ref 35.0–47.0)
Hemoglobin: 12.6 g/dL (ref 12.0–16.0)
LYMPHS ABS: 0.7 10*3/uL — AB (ref 1.0–3.6)
LYMPHS PCT: 17 %
MCH: 28.1 pg (ref 26.0–34.0)
MCHC: 33.7 g/dL (ref 32.0–36.0)
MCV: 83.5 fL (ref 80.0–100.0)
MONO ABS: 0.4 10*3/uL (ref 0.2–0.9)
Monocytes Relative: 8 %
NEUTROS ABS: 3.1 10*3/uL (ref 1.4–6.5)
Neutrophils Relative %: 73 %
PLATELETS: 203 10*3/uL (ref 150–440)
RBC: 4.49 MIL/uL (ref 3.80–5.20)
RDW: 14.5 % (ref 11.5–14.5)
WBC: 4.3 10*3/uL (ref 3.6–11.0)

## 2018-05-28 LAB — COMPREHENSIVE METABOLIC PANEL
ALBUMIN: 3.5 g/dL (ref 3.5–5.0)
ALT: 33 U/L (ref 0–44)
ANION GAP: 12 (ref 5–15)
AST: 24 U/L (ref 15–41)
Alkaline Phosphatase: 63 U/L (ref 38–126)
BUN: 21 mg/dL (ref 8–23)
CHLORIDE: 94 mmol/L — AB (ref 98–111)
CO2: 29 mmol/L (ref 22–32)
Calcium: 9 mg/dL (ref 8.9–10.3)
Creatinine, Ser: 0.88 mg/dL (ref 0.44–1.00)
GFR calc Af Amer: >60 mL/min (ref >60)
GFR calc non Af Amer: >60 mL/min (ref >60)
GLUCOSE: 96 mg/dL (ref 70–99)
POTASSIUM: 3.5 mmol/L (ref 3.5–5.1)
SODIUM: 135 mmol/L (ref 135–145)
Total Bilirubin: 1 mg/dL (ref 0.3–1.2)
Total Protein: 6.9 g/dL (ref 6.5–8.1)

## 2018-05-28 MED ORDER — SODIUM CHLORIDE 0.9 % IV SOLN
Freq: Once | INTRAVENOUS | Status: AC
  Administered 2018-05-28: 15:00:00 via INTRAVENOUS
  Filled 2018-05-28: qty 250

## 2018-05-28 MED ORDER — ONDANSETRON HCL 4 MG/2ML IJ SOLN
4.0000 mg | Freq: Once | INTRAMUSCULAR | Status: AC
  Administered 2018-05-28: 4 mg via INTRAVENOUS

## 2018-05-28 MED ORDER — SODIUM CHLORIDE 0.9% FLUSH
10.0000 mL | INTRAVENOUS | Status: DC | PRN
  Administered 2018-05-28: 10 mL via INTRAVENOUS
  Filled 2018-05-28: qty 10

## 2018-05-28 MED ORDER — HEPARIN SOD (PORK) LOCK FLUSH 100 UNIT/ML IV SOLN
500.0000 [IU] | Freq: Once | INTRAVENOUS | Status: AC
  Administered 2018-05-28: 500 [IU] via INTRAVENOUS
  Filled 2018-05-28: qty 5

## 2018-05-28 MED ORDER — ONDANSETRON HCL 4 MG/2ML IJ SOLN
INTRAMUSCULAR | Status: AC
  Filled 2018-05-28: qty 2

## 2018-05-28 NOTE — Progress Notes (Signed)
[facility] [place]  [phone] [phone]  Patient Care Team: Valerie Roys, DO as PCP - General (Family Medicine) Thornton Park, MD as Referring Physician (Orthopedic Surgery) Earlie Server, MD as Medical Oncologist (Medical Oncology)   Name of the patient: Wendy Marsh  [id number]  May 07, 1956   Date of visit: 05/28/18  Diagnosis- Breast Cancer  Chief complaint/ Reason for visit- Malaise  Heme/Onc history:  Wendy Marsh initially presented with self-palpated right breast mass x 1 month. PCP sent for diagnostic mammogram.     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[place])    05/09/2018 - 05/14/2018 Chemotherapy    The patient had DOXOrubicin (ADRIAMYCIN) chemo injection 146 mg, 60 mg/m2, Intravenous,  Once, 0 of 4 cycles palonosetron (ALOXI) injection 0.25 mg, 0.25 mg, Intravenous,  Once, 0 of 8 cycles pegfilgrastim-cbqv (UDENYCA) injection 6 mg, 6 mg, Subcutaneous, Once, 0 of 4 cycles CARBOplatin (PARAPLATIN) in sodium chloride 0.9 % 100 mL chemo infusion, , Intravenous,  Once, 0 of 4 cycles cyclophosphamide (CYTOXAN) 1,460 mg in sodium chloride 0.9 % 250 mL chemo infusion, 600 mg/m2, Intravenous,  Once, 0 of 4 cycles PACLitaxel (TAXOL) 192 mg in sodium chloride 0.9 % 250 mL chemo infusion (</= 68m/m2), 80 mg/m2, Intravenous,  Once, 0 of 4 cycles fosaprepitant (EMEND) 150 mg, dexamethasone (DECADRON) 12 mg in sodium chloride 0.9 % 145 mL IVPB, , Intravenous,  Once, 0 of 8 cycles  for chemotherapy treatment.     05/20/2018 -  Chemotherapy    The patient had palonosetron (ALOXI) injection 0.25 mg, 0.25 mg, Intravenous,  Once, 1 of 6 cycles Administration: 0.25 mg (05/22/2018) pegfilgrastim-cbqv (UDENYCA) injection 6 mg, 6 mg, Subcutaneous, Once, 1 of 6 cycles CARBOplatin (PARAPLATIN) 750 mg in sodium chloride 0.9 % 250 mL  chemo infusion, 750 mg (100 % of original dose 750 mg), Intravenous,  Once, 1 of 6 cycles Dose modification:   (original dose 750 mg, Cycle 1) Administration: 750 mg (05/22/2018) DOCEtaxel (TAXOTERE) 140 mg in sodium chloride 0.9 % 250 mL chemo infusion, 75 mg/m2 = 140 mg, Intravenous,  Once, 1 of 6 cycles Administration: 140 mg (05/22/2018)  for chemotherapy treatment.      Malignant neoplasm of lower-outer quadrant of right female breast ([place]   04/27/2018 Mammogram    DIAGNOSTIC MAMMOGRAM BILATERAL & [place] RIGHT 1. Highly suspicious mass within the RIGHT breast at the 6 o'clock axis, 9 cm from the nipple, measuring 2.9 cm, corresponding to the area of clinical concern. Ultrasound-guided biopsy is recommended. 2. No evidence of malignancy within the LEFT breast.  Targeted ultrasound is performed, showing an irregular hypoechoic mass in the RIGHT breast at the 6 o'clock axis, 9 cm from the nipple, with internal vascularity, measuring 2.9 cm greatest dimension, corresponding to the palpable lump. Ultrasound of the RIGHT axilla shows no enlarged or morphologically abnormal lymph nodes.    05/02/2018 Initial Biopsy    A. RIGHT BREAST, LOWER OUTER QUADRANT; ULTRASOUND-GUIDED CORE BIOPSY:  - INVASIVE MAMMARY CARCINOMA.   Size of invasive carcinoma: 15 mm in this sample  Histologic grade of invasive carcinoma: Grade 3            Glandular/tubular differentiation score: 3            Nuclear pleomorphism score: 3            Mitotic rate score: 3  Total score: 9  Ductal carcinoma in situ: Not identified  Lymphovascular invasion: Suspicious, favor present      05/08/2018 Initial Diagnosis    Malignant neoplasm of lower-outer quadrant of right female breast ([place])    05/08/2018 Cancer Staging    Staging form: Breast, AJCC 8th Edition - Clinical stage from 05/08/2018: Stage IIB (cT2, cN0, cM0, G3, ER-, PR-, HER2-) - Signed by Earlie Server, MD on  05/08/2018    05/08/2018 Tumor Marker    05/08/18- CA 27.29 25.7  CA 15-3 11.2    05/14/2018 Echocardiogram    MUGA- calculated LVEF 45%- She was referred to cardiology.      05/22/2018 -  Neo-Adjuvant Chemotherapy    Initial plan for DD-AC followed by Taxol and Carboplatin, however, given cardiotoxicity concerns of Adriamycin and findings on MUGA, Docetaxel and Carboplatin every 3 weeks for 4-6 cycles (based on data from PROGECT study).   She would receive Udenyca for prophylaxis for chemotherapy induced neutropenia and to prevent febrile neutropenias.     05/22/2018 Genetic Testing    Genetic Testing- she has a family history of breast cancer and a personal history of triple negative breast cancer.   On 05/08/18, she underwent [facility]'s 84 gene multi-cancer panel which did not reveal pathogenic mutation. BRCA negative. VUS was detected: SUFU c.992G>A (p.Arg331Gln)     Interval history- Wendy Marsh, 62 year old female, with above history of right breast cancer, presents to [facility] for malaise and low blood pressures since her first chemotherapy on 05/22/18.  She has been holding her blood pressure medication and notices that pressures are persistently decreased compared to her baseline.  She has persistent nausea but no vomiting.  She is noticed that oral intake is somewhat reduced due to fatigue and malaise.  She denies any fevers or emesis.  Denies chest pain.  Denies urinary complaints.  ECOG FS:2 - Symptomatic, <50% confined to bed  Review of systems- Review of Systems  Constitutional: Positive for malaise/fatigue and weight loss. Negative for chills and fever.  HENT: Positive for sore throat. Negative for congestion, ear discharge, ear pain, sinus pain and tinnitus.   Eyes: Negative.   Respiratory: Negative.  Negative for cough, sputum production and shortness of breath.   Cardiovascular: Negative for chest pain, palpitations, orthopnea, claudication and leg swelling  (Has leg swelling at baseline; none currently).  Gastrointestinal: Positive for diarrhea and nausea. Negative for abdominal pain, blood in stool, constipation, heartburn and vomiting.  Genitourinary: Negative.   Musculoskeletal: Negative.   Skin: Negative.   Neurological: Negative for dizziness, tingling, weakness and headaches.  Endo/Heme/Allergies: Negative.   Psychiatric/Behavioral: Negative.     Current treatment- Carbo-Docetaxel (05/22/18) Udenyca (05/24/18)  Allergies  Allergen Reactions  . Morphine And Related Nausea And Vomiting  . Peanut-Containing Drug Products Swelling    Past Medical History:  Diagnosis Date  . Arthritis   . Atrial fibrillation ([place])   . Dysrhythmia    afib  . Hyperlipidemia   . Hypertension   . Hypothyroidism   . Lumbago   . Osteoporosis   . Thyroid disease     Past Surgical History:  Procedure Laterality Date  . BREAST BIOPSY  05/02/2018  . JOINT REPLACEMENT Left    tkr  . PORTACATH PLACEMENT Right 05/11/2018   Procedure: INSERTION  I.J PORT-A-CATH;  Surgeon: Robert Bellow, MD;  Location: [facility] ORS;  Service: General;  Laterality: Right;  . REPLACEMENT TOTAL KNEE      Social History  Socioeconomic History  . Marital status: Married    Spouse name: jerry  . Number of children: 3  . Years of education: Not on file  . Highest education level: Some college, no degree  Occupational History  . Not on file  Social Needs  . Financial resource strain: Somewhat hard  . Food insecurity:    Worry: Never true    Inability: Never true  . Transportation needs:    Medical: No    Non-medical: No  Tobacco Use  . Smoking status: Never Smoker  . Smokeless tobacco: Never Used  Substance and Sexual Activity  . Alcohol use: No  . Drug use: No  . Sexual activity: Yes    Birth control/protection: Post-menopausal  Lifestyle  . Physical activity:    Days per week: 0 days    Minutes per session: 0 min  . Stress: Only a little  Relationships    . Social connections:    Talks on phone: More than three times a week    Gets together: Twice a week    Attends religious service: Never    Active member of club or organization: No    Attends meetings of clubs or organizations: Not on file    Relationship status: Married  . Intimate partner violence:    Fear of current or ex partner: No    Emotionally abused: No    Physically abused: No    Forced sexual activity: No  Other Topics Concern  . Not on file  Social History Narrative  . Not on file    Family History  Problem Relation Age of Onset  . Diabetes Mother   . Congestive Heart Failure Mother   . Breast cancer Maternal Grandmother        dx 86s; deceased 47s  . Cancer Maternal Uncle 30       deceased 52s  . Other Father        No information on father or paternal relatives  . Breast cancer Other 63       paternal half-sister; also esophageal ca at 86; currently 91  . Diabetes Other   . Hypertension Other   . Kidney disease Other   . Thyroid disease Other      Current Outpatient Medications:  .  aspirin 81 MG tablet, Take 81 mg by mouth daily., Disp: , Rfl:  .  dexamethasone (DECADRON) 4 MG tablet, Take 2 tablets (8 mg total) by mouth 2 (two) times daily. Then again the day after chemo for 3 days., Disp: 30 tablet, Rfl: 1 .  lidocaine-prilocaine (EMLA) cream, Apply to affected area once, Disp: 30 g, Rfl: 3 .  Nutritional Supplements (JUICE PLUS FIBRE PO), Take 2 capsules by mouth daily. Fruit blend, Disp: , Rfl:  .  ondansetron (ZOFRAN) 8 MG tablet, Take 1 tablet (8 mg total) by mouth 2 (two) times daily as needed for refractory nausea / vomiting. Start on day 3 after chemo., Disp: 30 tablet, Rfl: 1 .  prochlorperazine (COMPAZINE) 10 MG tablet, Take 1 tablet (10 mg total) by mouth every 6 (six) hours as needed (Nausea or vomiting)., Disp: 30 tablet, Rfl: 1 .  EPINEPHrine 0.3 mg/0.3 mL IJ SOAJ injection, , Disp: , Rfl: 12 .  flecainide (TAMBOCOR) 50 MG tablet, Take 50  mg by mouth daily. , Disp: , Rfl:  .  levothyroxine (SYNTHROID, LEVOTHROID) 175 MCG tablet, Take 1 tablet (175 mcg total) by mouth daily before breakfast., Disp: 90 tablet, Rfl: 0 .  lisinopril-hydrochlorothiazide (PRINZIDE,ZESTORETIC) 20-25 MG tablet, Take 2 tablets by mouth daily. (Patient not taking: Reported on 05/28/2018), Disp: 180 tablet, Rfl: 3 .  metoprolol succinate (TOPROL-XL) 50 MG 24 hr tablet, Take 2 tablets (100 mg total) by mouth daily. Take with or immediately following a meal. (Patient not taking: Reported on 05/28/2018), Disp: 180 tablet, Rfl: 3 .  naproxen sodium (ALEVE) 220 MG tablet, Take 220 mg by mouth daily as needed (pain). , Disp: , Rfl:  .  potassium chloride (K-DUR) 10 MEQ tablet, Take 1 tablet (10 mEq total) by mouth daily. (Patient not taking: Reported on 05/28/2018), Disp: 7 tablet, Rfl: 0 No current facility-administered medications for this visit.   Facility-Administered Medications Ordered in Other Visits:  .  heparin lock flush 100 unit/mL, 500 Units, Intravenous, Once, Earlie Server, MD .  sodium chloride flush (NS) 0.9 % injection 10 mL, 10 mL, Intravenous, PRN, Earlie Server, MD, 10 mL at 05/28/18 1441  Physical exam:  Vitals:   05/28/18 1456 05/28/18 1459  BP: 122/60   Pulse: 97   Resp: 20   Temp:  97.8 F (36.6 C)  TempSrc:  Oral  SpO2: 98%    GENERAL: Patient is a well appearing female in no acute distress.  Accompanied HEENT:  Sclerae anicteric.  Oropharynx clear and moist. No ulcerations or evidence of oropharyngeal candidiasis. Neck is supple.  LUNGS:  Clear to auscultation bilaterally.  No wheezes or rhonchi. HEART:  Regular rate and rhythm. No murmur appreciated. ABDOMEN:  Soft, nontender.  Positive, normoactive bowel sounds. EXTREMITIES:  No peripheral edema.   SKIN:  Clear with no obvious rashes or skin changes. No nail dyscrasia. NEURO:  Nonfocal. Well oriented.  Appropriate affect.   CMP Latest Ref Rng & Units 05/28/2018  Glucose 70 - 99 mg/dL 96   BUN 8 - 23 mg/dL 21  Creatinine 0.44 - 1.00 mg/dL 0.88  Sodium 135 - 145 mmol/L 135  Potassium 3.5 - 5.1 mmol/L 3.5  Chloride 98 - 111 mmol/L 94(L)  CO2 22 - 32 mmol/L 29  Calcium 8.9 - 10.3 mg/dL 9.0  Total Protein 6.5 - 8.1 g/dL 6.9  Total Bilirubin 0.3 - 1.2 mg/dL 1.0  Alkaline Phos 38 - 126 U/L 63  AST 15 - 41 U/L 24  ALT 0 - 44 U/L 33   CBC Latest Ref Rng & Units 05/28/2018  WBC 3.6 - 11.0 K/uL 4.3  Hemoglobin 12.0 - 16.0 g/dL 12.6  Hematocrit 35.0 - 47.0 % 37.5  Platelets 150 - 440 K/uL 203    No images are attached to the encounter.  Nm Cardiac Muga Rest  Result Date: 05/14/2018 CLINICAL DATA:  Breast cancer. Evaluate cardiac function in relation to chemotherapy. EXAM: NUCLEAR MEDICINE CARDIAC BLOOD POOL IMAGING (MUGA) TECHNIQUE: Cardiac multi-gated acquisition was performed at rest following intravenous injection of Tc-81mlabeled red blood cells. RADIOPHARMACEUTICALS:  23.0 mCi Tc-967mertechnetate in-vitro labeled red blood cells IV COMPARISON:  None FINDINGS: No  focal wall motion abnormality of the left ventricle. Calculated left ventricular ejection fraction equals 45% IMPRESSION: Calculated LEFT ventricular ejection fraction equals 45% Electronically Signed   By: StSuzy Bouchard.D.   On: 05/14/2018 16:48   Dg Chest Port 1 View  Result Date: 05/11/2018 CLINICAL DATA:  Status post Port-A-Cath placement. EXAM: PORTABLE CHEST 1 VIEW COMPARISON:  Radiographs of October 05, 2012. FINDINGS: The heart size and mediastinal contours are within normal limits. Both lungs are clear. Interval placement of right internal jugular Port-A-Cath with distal tip in  expected position of the SVC. No pneumothorax or pleural effusion is noted. The visualized skeletal structures are unremarkable. IMPRESSION: Interval placement of right internal jugular Port-A-Cath with distal tip in expected position of the SVC. No pneumothorax is noted. Electronically Signed   By: Marijo Conception, M.D.   On:  05/11/2018 11:01   Dg C-arm 1-60 Min-no Report  Result Date: 05/11/2018 Fluoroscopy was utilized by the requesting physician.  No radiographic interpretation.   Mm Clip Placement Right  Result Date: 05/02/2018 CLINICAL DATA:  Evaluate biopsy marker EXAM: DIAGNOSTIC RIGHT MAMMOGRAM POST ULTRASOUND BIOPSY COMPARISON:  Previous exam(s). FINDINGS: Mammographic images were obtained following ultrasound guided biopsy of right breast mass. The coil shaped clip is within the biopsied mass. IMPRESSION: The coil shaped clip is within the biopsied mass. Final Assessment: Post Procedure Mammograms for Marker Placement Electronically Signed   By: Dorise Bullion III M.D   On: 05/02/2018 08:53   [place] Rt Breast Bx W Loc Dev 1st Lesion Img Bx Spec US Guide  Addendum Date: 05/04/2018   ADDENDUM REPORT: 05/04/2018 11:22 ADDENDUM: PATHOLOGY ADDENDUM: Pathology: Invasive mammary carcinoma, grade 3, suspicious for lymphovascular invasion. No DCIS. Pathology concordant with imaging findings: Yes Recommendation: Surgical consultation. Localization/excision considerations: Wire localization. At the request of the patient, I spoke with her by telephone on 05/04/2018 at 10 a.m. She reports doing well after the biopsy, and awaits a call from the nurse navigator to schedule her necessary appointments. The findings and recommendations were discussed with the patient. Electronically Signed   By: Fidela Salisbury M.D.   On: 05/04/2018 11:22   Result Date: 05/04/2018 CLINICAL DATA:  Right breast mass EXAM: ULTRASOUND GUIDED RIGHT BREAST CORE NEEDLE BIOPSY COMPARISON:  Previous exam(s). FINDINGS: I met with the patient and we discussed the procedure of ultrasound-guided biopsy, including benefits and alternatives. We discussed the high likelihood of a successful procedure. We discussed the risks of the procedure, including infection, bleeding, tissue injury, clip migration, and inadequate sampling. Informed written consent was given.  The usual time-out protocol was performed immediately prior to the procedure. Lesion quadrant: Lower Using sterile technique and 1% Lidocaine as local anesthetic, under direct ultrasound visualization, a 12 gauge spring-loaded device was used to perform biopsy of a right breast mass using a lateral approach. At the conclusion of the procedure a tissue marker clip was deployed into the biopsy cavity. Follow up 2 view mammogram was performed and dictated separately. IMPRESSION: Ultrasound guided biopsy of a right breast mass. No apparent complications. Electronically Signed: By: Dorise Bullion III M.D On: 05/02/2018 08:36    Assessment and plan- Patient is a 62 y.o. female diagnosed with breast cancer who presents to symptom management clinic for malaise and low blood pressure after chemotherapy.  1.  Breast cancer-stage IIb of lower outer quadrant of right breast, ER negative, PR negative, HER-2 negative. Initiated carbo docetaxel on 05/22/2018 with Udenyca support on 05/24/18 with Dr. Tasia Catchings.   2.  Convalescence following chemotherapy-poor oral intake and malaise following recent initiation of chemotherapy.  Borderline orthostatic vital signs but poor oral intake and weight loss suspect underlying dehydration.  Gentle hydration of IV fluids in clinic in setting of heart failure.  EF 45% on baseline MUGA.   3.  Nausea-likely related to chemotherapy.  Received Decadron and Aloxi with treatment.  Will give IV Zofran in clinic today. Risk of qt prolongation w/ aloxi, zofran, and flecainide discussed. HR today stable and likely elevated from baseline d/t dehydration. Discussed use of compazine  at home and monitoring heart rate over weekend. May consider alternative anti-emetics if persistent use is needed. She sees cardiology and Dr. Tasia Catchings next week and can discuss further if needed. Recommended bland diet and avoidance of high fat, high carbohydrate, and spicy foods in week following chemotherapy as this may exacerbate  symptoms.  Encouraged protein with every meal and small frequent meals.  rtc on 05/30/18 for labs and follow-up with Dr. Tasia Catchings.    Visit Diagnosis 1. Malignant neoplasm of lower-outer quadrant of right breast of female, estrogen receptor negative ([place])   2. Convalescence following chemotherapy   3. Chemotherapy-induced nausea     Patient expressed understanding and was in agreement with this plan. She also understands that She can call clinic at any time with any questions, concerns, or complaints.   Thank you for allowing me to participate in the care of this very pleasant patient.   Beckey Rutter, DNP, AGNP-C [place] at [facility] [phone] (work cell) [phone] (office)

## 2018-05-28 NOTE — Telephone Encounter (Signed)
Patient called and reports that she is not feeling well and was hypotensive this weekend. She had chemotherapy 8/20, Neulasta Thursday. Appointment accepted for 130 lab then see NP in [facility]om Management Clinic.

## 2018-05-30 ENCOUNTER — Other Ambulatory Visit: Payer: Self-pay

## 2018-05-30 ENCOUNTER — Encounter: Payer: Self-pay | Admitting: Oncology

## 2018-05-30 ENCOUNTER — Inpatient Hospital Stay: Payer: BLUE CROSS/BLUE SHIELD

## 2018-05-30 ENCOUNTER — Inpatient Hospital Stay (HOSPITAL_BASED_OUTPATIENT_CLINIC_OR_DEPARTMENT_OTHER): Payer: BLUE CROSS/BLUE SHIELD | Admitting: Oncology

## 2018-05-30 VITALS — BP 127/80 | HR 116 | Temp 97.3°F | Resp 18 | Wt 295.1 lb

## 2018-05-30 DIAGNOSIS — Z5111 Encounter for antineoplastic chemotherapy: Secondary | ICD-10-CM

## 2018-05-30 DIAGNOSIS — M818 Other osteoporosis without current pathological fracture: Secondary | ICD-10-CM

## 2018-05-30 DIAGNOSIS — R197 Diarrhea, unspecified: Secondary | ICD-10-CM | POA: Diagnosis not present

## 2018-05-30 DIAGNOSIS — E876 Hypokalemia: Secondary | ICD-10-CM

## 2018-05-30 DIAGNOSIS — E039 Hypothyroidism, unspecified: Secondary | ICD-10-CM

## 2018-05-30 DIAGNOSIS — I501 Left ventricular failure: Secondary | ICD-10-CM

## 2018-05-30 DIAGNOSIS — Z171 Estrogen receptor negative status [ER-]: Secondary | ICD-10-CM

## 2018-05-30 DIAGNOSIS — C50511 Malignant neoplasm of lower-outer quadrant of right female breast: Secondary | ICD-10-CM

## 2018-05-30 DIAGNOSIS — R110 Nausea: Secondary | ICD-10-CM | POA: Diagnosis not present

## 2018-05-30 DIAGNOSIS — I4891 Unspecified atrial fibrillation: Secondary | ICD-10-CM

## 2018-05-30 DIAGNOSIS — C50919 Malignant neoplasm of unspecified site of unspecified female breast: Secondary | ICD-10-CM

## 2018-05-30 DIAGNOSIS — I952 Hypotension due to drugs: Secondary | ICD-10-CM

## 2018-05-30 HISTORY — DX: Hypotension due to drugs: I95.2

## 2018-05-30 LAB — CBC WITH DIFFERENTIAL/PLATELET
BASOS ABS: 0.1 10*3/uL (ref 0–0.1)
Basophils Relative: 1 %
EOS ABS: 0.1 10*3/uL (ref 0–0.7)
EOS PCT: 1 %
HCT: 36.6 % (ref 35.0–47.0)
Hemoglobin: 12 g/dL (ref 12.0–16.0)
Lymphocytes Relative: 15 %
Lymphs Abs: 1.7 10*3/uL (ref 1.0–3.6)
MCH: 27.7 pg (ref 26.0–34.0)
MCHC: 32.7 g/dL (ref 32.0–36.0)
MCV: 84.7 fL (ref 80.0–100.0)
Monocytes Absolute: 1.6 10*3/uL — ABNORMAL HIGH (ref 0.2–0.9)
Monocytes Relative: 15 %
Neutro Abs: 7.6 10*3/uL — ABNORMAL HIGH (ref 1.4–6.5)
Neutrophils Relative %: 68 %
PLATELETS: 202 10*3/uL (ref 150–440)
RBC: 4.32 MIL/uL (ref 3.80–5.20)
RDW: 14.6 % — ABNORMAL HIGH (ref 11.5–14.5)
WBC: 11.1 10*3/uL — AB (ref 3.6–11.0)

## 2018-05-30 LAB — COMPREHENSIVE METABOLIC PANEL
ALT: 38 U/L (ref 0–44)
AST: 33 U/L (ref 15–41)
Albumin: 3.5 g/dL (ref 3.5–5.0)
Alkaline Phosphatase: 62 U/L (ref 38–126)
Anion gap: 10 (ref 5–15)
BILIRUBIN TOTAL: 0.3 mg/dL (ref 0.3–1.2)
BUN: 9 mg/dL (ref 8–23)
CO2: 28 mmol/L (ref 22–32)
CREATININE: 0.92 mg/dL (ref 0.44–1.00)
Calcium: 8.7 mg/dL — ABNORMAL LOW (ref 8.9–10.3)
Chloride: 101 mmol/L (ref 98–111)
GFR calc Af Amer: >60 mL/min (ref >60)
Glucose, Bld: 104 mg/dL — ABNORMAL HIGH (ref 70–99)
POTASSIUM: 3.8 mmol/L (ref 3.5–5.1)
Sodium: 139 mmol/L (ref 135–145)
TOTAL PROTEIN: 6.8 g/dL (ref 6.5–8.1)

## 2018-05-30 MED ORDER — LORAZEPAM 0.5 MG PO TABS: 0.5000 mg | ORAL_TABLET | Freq: Two times a day (BID) | ORAL | 0 refills | Status: DC | PRN

## 2018-05-30 NOTE — Progress Notes (Signed)
Patient here for follow up.Patient concerned about flecainide and zofran reaction. Pt states she have been having loose stools since Saturday night and it has not stopped, she has taken imodium but was not effective.

## 2018-05-30 NOTE — Progress Notes (Signed)
Hematology/Oncology Consult note [facility] [phone] [phone]   Patient Care Team: Valerie Roys, DO as PCP - General (Family Medicine) Thornton Park, MD as Referring Physician (Orthopedic Surgery) Earlie Server, MD as Medical Oncologist (Medical Oncology)  REFERRING PROVIDER: Valerie Roys, DO REASON FOR VISIT Follow up for treatment of Breast cancer, assess of chemotherapy toxicity.  HISTORY OF PRESENTING ILLNESS:  Wendy Marsh is a  62 y.o.  female with PMH listed below who was referred to me for evaluation of breast cancer Patient report feeling right breast mass for about 1 month. She mentioned to primary care provider and had diagnostic mammogram Mammogram showed right breast 6 o'clock axis breast mass, 2.9 cm corresponding to area of concern.  Target [place] was performed showed irregular hypoechoic mass in right breast Right axilla shows no enlarged or morphologically abnormal lymph nodes.  Breast mass was biopsied, and pathology showed invasive mammary carcinoma.  Grade 3 , ER/PR- HER2 -.  LVI suspicious, favor present.   Nipple discharge: denies.  Breast Skin changes, denies Family history: sister has history of breast cancer, father deceased from esophageal cancer. ? M grandmother breast cancer.   History of radiation to chest: denies.  Previous breast surgery:   Patient presents with multiple family members including husband, three daughters, and son in Sports coach.  She walks with a walker. Chronic right knee pain due to osteoarthritis. Paroxymal A fib, on metoprolol for rate control.  Asprin '81mg'$  daily  #  baseline MUGA testing done and reviewed systolic function depression, LVEF 45%. Given that Adriamycin has cardiac toxicity, potentially can lead to irreversible cardiomyopathy, I discussed with patient about using non-anthracycline-containing regimen. Would recommend Docetaxel '75mg'$ /m2 and Carboplatin every 3 weeks for  4-6 cycles. [ data from Charlotte Hall et al 2018 Clin Cancer Res, Pathological Response and Survival in Triple-Negative Breast Cancer Following Neoadjuvant Carboplatin plus Docetaxel. Stage I-III TNBC patients, Neoadjuvant carboplatin (AUC6) plus docetaxel (75 mg/m2) every 21 days  6 cycles. pCR was 55% and Residual cancer burden (RCB) were 13%. Excellent Three-year RFS was 90% in patients with pCR and 66% in those without pCR]   # # Family history of breast cancer plus personal history of triple negative breast cancer. Testing did not reveal a pathogenic mutation in any of the genes analyzed.  BRCA negative.  VUS was detected. Does not change management.   INTERVAL HISTORY Wendy Marsh is a 62 y.o. female who has above history reviewed by me today presents for follow up visit for management of breast cancer.  Patient status post cycle 1 carboplatin and Taxotere.  She reports feeling really tired for 3 days starting day 3 of chemotherapy.  Also felt very nauseated.  She did not eat much and blood pressure was low.  She was seen by nurse practitioner for symptom management was given IV fluid and IV Zofran. During the interval patient was also symmetrical ask for evaluation of mildly decreased systolic function.  Reviewed Dr. Heywood Footman note, no chemotherapy restriction.  At the lowest risk possible for cardiovascular complications.  Echocardiogram was repeated by Dr. Jose Persia showed normal EF of 55%.  Currently there is no evidence of active and or significant angina and or congestive heart failure. She was also recommended by cardiology to hold flecainide 2 to 3 days before and after chemo if Zofran will be used to reduce QT interval changes and interaction.  #Today she reports continued to have diarrhea, 6-7 episodes a day.  Takes Imodium with some symptom improved.  Review of Systems  Constitutional: Negative for chills, fever, malaise/fatigue and weight loss.  HENT: Negative for  nosebleeds and sore throat.   Eyes: Negative for double vision, photophobia and redness.  Respiratory: Negative for cough, shortness of breath and wheezing.   Cardiovascular: Negative for chest pain, palpitations and orthopnea.  Gastrointestinal: Negative for abdominal pain, blood in stool, nausea and vomiting.  Genitourinary: Negative for dysuria.  Musculoskeletal: Positive for joint pain. Negative for back pain, myalgias and neck pain.       Chronic right knee pain.  Skin: Negative for itching and rash.  Neurological: Negative for dizziness, tingling and tremors.  Endo/Heme/Allergies: Negative for environmental allergies. Does not bruise/bleed easily.  Psychiatric/Behavioral: Negative for depression.    MEDICAL HISTORY:  Past Medical History:  Diagnosis Date  . Arthritis   . Atrial fibrillation ([place])   . Dysrhythmia    afib  . Hyperlipidemia   . Hypertension   . Hypothyroidism   . Lumbago   . Osteoporosis   . Thyroid disease     SURGICAL HISTORY: Past Surgical History:  Procedure Laterality Date  . BREAST BIOPSY  05/02/2018  . JOINT REPLACEMENT Left    tkr  . PORTACATH PLACEMENT Right 05/11/2018   Procedure: INSERTION  I.J PORT-A-CATH;  Surgeon: Robert Bellow, MD;  Location: [facility] ORS;  Service: General;  Laterality: Right;  . REPLACEMENT TOTAL KNEE      SOCIAL HISTORY: Social History   Socioeconomic History  . Marital status: Married    Spouse name: jerry  . Number of children: 3  . Years of education: Not on file  . Highest education level: Some college, no degree  Occupational History  . Not on file  Social Needs  . Financial resource strain: Somewhat hard  . Food insecurity:    Worry: Never true    Inability: Never true  . Transportation needs:    Medical: No    Non-medical: No  Tobacco Use  . Smoking status: Never Smoker  . Smokeless tobacco: Never Used  Substance and Sexual Activity  . Alcohol use: No  . Drug use: No  . Sexual activity: Yes      Birth control/protection: Post-menopausal  Lifestyle  . Physical activity:    Days per week: 0 days    Minutes per session: 0 min  . Stress: Only a little  Relationships  . Social connections:    Talks on phone: More than three times a week    Gets together: Twice a week    Attends religious service: Never    Active member of club or organization: No    Attends meetings of clubs or organizations: Not on file    Relationship status: Married  . Intimate partner violence:    Fear of current or ex partner: No    Emotionally abused: No    Physically abused: No    Forced sexual activity: No  Other Topics Concern  . Not on file  Social History Narrative  . Not on file    FAMILY HISTORY: Family History  Problem Relation Age of Onset  . Diabetes Mother   . Congestive Heart Failure Mother   . Breast cancer Maternal Grandmother        dx 82s; deceased 70s  . Cancer Maternal Uncle 46       deceased 15s  . Other Father        No information on father or paternal relatives  .  Breast cancer Other 49       paternal half-sister; also esophageal ca at 56; currently 21  . Diabetes Other   . Hypertension Other   . Kidney disease Other   . Thyroid disease Other     ALLERGIES:  is allergic to morphine and related and peanut-containing drug products.  MEDICATIONS:  Current Outpatient Medications  Medication Sig Dispense Refill  . aspirin 81 MG tablet Take 81 mg by mouth daily.    Marland Kitchen dexamethasone (DECADRON) 4 MG tablet Take 2 tablets (8 mg total) by mouth 2 (two) times daily. Then again the day after chemo for 3 days. 30 tablet 1  . EPINEPHrine 0.3 mg/0.3 mL IJ SOAJ injection   12  . flecainide (TAMBOCOR) 50 MG tablet Take 50 mg by mouth 2 (two) times daily.     Marland Kitchen levothyroxine (SYNTHROID, LEVOTHROID) 175 MCG tablet Take 1 tablet (175 mcg total) by mouth daily before breakfast. 90 tablet 0  . lidocaine-prilocaine (EMLA) cream Apply to affected area once 30 g 3  . metoprolol  succinate (TOPROL-XL) 50 MG 24 hr tablet Take 2 tablets (100 mg total) by mouth daily. Take with or immediately following a meal. 180 tablet 3  . Nutritional Supplements (JUICE PLUS FIBRE PO) Take 2 capsules by mouth daily. Fruit blend    . ondansetron (ZOFRAN) 8 MG tablet Take 1 tablet (8 mg total) by mouth 2 (two) times daily as needed for refractory nausea / vomiting. Start on day 3 after chemo. 30 tablet 1  . prochlorperazine (COMPAZINE) 10 MG tablet Take 1 tablet (10 mg total) by mouth every 6 (six) hours as needed (Nausea or vomiting). 30 tablet 1  . lisinopril-hydrochlorothiazide (PRINZIDE,ZESTORETIC) 20-25 MG tablet Take 2 tablets by mouth daily. (Patient not taking: Reported on 05/28/2018) 180 tablet 3  . LORazepam (ATIVAN) 0.5 MG tablet Take 1 tablet (0.5 mg total) by mouth every 12 (twelve) hours as needed for anxiety (NAUSEA). 30 tablet 0  . naproxen sodium (ALEVE) 220 MG tablet Take 220 mg by mouth daily as needed (pain).     . potassium chloride (K-DUR) 10 MEQ tablet Take 1 tablet (10 mEq total) by mouth daily. (Patient not taking: Reported on 05/28/2018) 7 tablet 0   No current facility-administered medications for this visit.      PHYSICAL EXAMINATION: ECOG PERFORMANCE STATUS: 1 - Symptomatic but completely ambulatory Vitals:   05/30/18 1431  BP: 127/80  Pulse: (!) 116  Resp: 18  Temp: (!) 97.3 F (36.3 C)   Filed Weights   05/30/18 1431  Weight: 295 lb 1.6 oz (133.9 kg)    Physical Exam  Constitutional: She is oriented to person, place, and time. She appears well-developed and well-nourished. No distress.  Obese,   HENT:  Head: Normocephalic and atraumatic.  Right Ear: External ear normal.  Left Ear: External ear normal.  Mouth/Throat: Oropharynx is clear and moist.  Eyes: Pupils are equal, round, and reactive to light. EOM are normal. No scleral icterus.  Neck: Normal range of motion. Neck supple.  Cardiovascular: Normal rate, regular rhythm and normal heart  sounds.  Pulmonary/Chest: Effort normal. No respiratory distress. She has no wheezes.  Abdominal: Soft. Bowel sounds are normal. She exhibits no distension and no mass. There is no tenderness.  Musculoskeletal: Normal range of motion. She exhibits edema. She exhibits no deformity.  Chronic lower extremity edema.  1+  Neurological: She is alert and oriented to person, place, and time. No cranial nerve deficit. Coordination normal.  Skin: Skin is warm and dry. No rash noted. No erythema.  Psychiatric: She has a normal mood and affect. Her behavior is normal. Thought content normal.      LABORATORY DATA:  I have reviewed the data as listed Lab Results  Component Value Date   WBC 11.1 (H) 05/30/2018   HGB 12.0 05/30/2018   HCT 36.6 05/30/2018   MCV 84.7 05/30/2018   PLT 202 05/30/2018   Recent Labs    05/22/18 1046 05/28/18 1435 05/30/18 1350  NA 139 135 139  K 3.3* 3.5 3.8  CL 100 94* 101  CO2 '27 29 28  '$ GLUCOSE 119* 96 104*  BUN '14 21 9  '$ CREATININE 0.75 0.88 0.92  CALCIUM 9.4 9.0 8.7*  GFRNONAA >60 >60 >60  GFRAA >60 >60 >60  PROT 7.4 6.9 6.8  ALBUMIN 3.4* 3.5 3.5  AST 19 24 33  ALT 18 33 38  ALKPHOS 68 63 62  BILITOT 0.5 1.0 0.3      ASSESSMENT & PLAN:  1. Diarrhea, unspecified type   2. Nausea without vomiting   3. 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 [facility])   Cancer Staging Malignant neoplasm of lower-outer quadrant of right female breast [facility]) Staging form: Breast, AJCC 8th Edition - Clinical stage from 05/08/2018: Stage IIB (cT2, cN0, cM0, G3, ER-, PR-, HER2-) - Signed by Earlie Server, MD on 05/08/2018  #Triple negative breast cancer, previously plan on DD AC followed by Taxol and carboplatin. Tolerated cycle 1 chemotherapy with some side effects.  See below. I discussed with patient that although repeat 2D echo showed normal LVEF, cardiology feels no restriction of chemotherapy, Patient also concerned with potential cardiovascular risk and prefer to stay  on current chemotherapy regimen which is reasonable.  #Nausea, chemo induced.  Discussed with patient that she can use Compazine as needed first.  She may use Zofran but not to combine with her anti-arrhythmia medication to minimize risk of QT interval prolongation.  This was also discussed with her by her cardiologist.  I also prescribed her a prescription of low-dose Ativan 0.5 mg every 12 hours as needed for anxiety/nausea.  She is made aware that she should not operate any vehicle/mechanics right after taking Ativan.  #Diarrhea, continue Imodium as instructed.  Send C. Difficile  screening  Encourage patient.  To increase oral hydration with water, Gatorade, Pedialyte's.  # Hypokalemia, counts improved.  Continue  K-dur 20mq daily.    Orders Placed This Encounter  Procedures  . C difficile quick screen w PCR reflex    Standing Status:   Future    Standing Expiration Date:   05/31/2019    All questions were answered. The patient knows to call the clinic with any problems questions or concerns.  Return of visit: 2 weeks for assessment prior to next cycle of treatment.  Total face to face encounter time for this patient visit was 25 min. >50% of the time was  spent in counseling and coordination of care.  .Earlie Server MD, PhD Hematology Oncology CSt. [facility] Hospital [facility]chesterat [facility] CampusPager- [phone]/28/2019

## 2018-05-31 ENCOUNTER — Other Ambulatory Visit: Payer: Self-pay

## 2018-05-31 DIAGNOSIS — R197 Diarrhea, unspecified: Secondary | ICD-10-CM

## 2018-05-31 DIAGNOSIS — C50511 Malignant neoplasm of lower-outer quadrant of right female breast: Secondary | ICD-10-CM | POA: Diagnosis not present

## 2018-05-31 LAB — C DIFFICILE QUICK SCREEN W PCR REFLEX
C Diff antigen: NEGATIVE
C Diff interpretation: NOT DETECTED
C Diff toxin: NEGATIVE

## 2018-06-05 ENCOUNTER — Encounter: Payer: Self-pay | Admitting: Nurse Practitioner

## 2018-06-12 ENCOUNTER — Inpatient Hospital Stay: Payer: BLUE CROSS/BLUE SHIELD

## 2018-06-12 ENCOUNTER — Inpatient Hospital Stay: Payer: BLUE CROSS/BLUE SHIELD | Attending: Oncology

## 2018-06-12 ENCOUNTER — Encounter: Payer: Self-pay | Admitting: Oncology

## 2018-06-12 ENCOUNTER — Inpatient Hospital Stay (HOSPITAL_BASED_OUTPATIENT_CLINIC_OR_DEPARTMENT_OTHER): Payer: BLUE CROSS/BLUE SHIELD | Admitting: Oncology

## 2018-06-12 ENCOUNTER — Other Ambulatory Visit: Payer: Self-pay

## 2018-06-12 VITALS — BP 155/76 | HR 70 | Temp 96.4°F | Resp 18 | Wt 302.9 lb

## 2018-06-12 DIAGNOSIS — C50919 Malignant neoplasm of unspecified site of unspecified female breast: Secondary | ICD-10-CM

## 2018-06-12 DIAGNOSIS — E039 Hypothyroidism, unspecified: Secondary | ICD-10-CM | POA: Insufficient documentation

## 2018-06-12 DIAGNOSIS — Z171 Estrogen receptor negative status [ER-]: Secondary | ICD-10-CM | POA: Insufficient documentation

## 2018-06-12 DIAGNOSIS — Z5111 Encounter for antineoplastic chemotherapy: Secondary | ICD-10-CM | POA: Insufficient documentation

## 2018-06-12 DIAGNOSIS — C50511 Malignant neoplasm of lower-outer quadrant of right female breast: Secondary | ICD-10-CM

## 2018-06-12 DIAGNOSIS — E876 Hypokalemia: Secondary | ICD-10-CM

## 2018-06-12 DIAGNOSIS — R197 Diarrhea, unspecified: Secondary | ICD-10-CM | POA: Diagnosis not present

## 2018-06-12 DIAGNOSIS — R110 Nausea: Secondary | ICD-10-CM | POA: Insufficient documentation

## 2018-06-12 DIAGNOSIS — D6481 Anemia due to antineoplastic chemotherapy: Secondary | ICD-10-CM | POA: Diagnosis not present

## 2018-06-12 DIAGNOSIS — Z79899 Other long term (current) drug therapy: Secondary | ICD-10-CM | POA: Insufficient documentation

## 2018-06-12 DIAGNOSIS — I10 Essential (primary) hypertension: Secondary | ICD-10-CM

## 2018-06-12 DIAGNOSIS — Z7982 Long term (current) use of aspirin: Secondary | ICD-10-CM | POA: Insufficient documentation

## 2018-06-12 DIAGNOSIS — I4891 Unspecified atrial fibrillation: Secondary | ICD-10-CM | POA: Diagnosis not present

## 2018-06-12 DIAGNOSIS — I501 Left ventricular failure: Secondary | ICD-10-CM

## 2018-06-12 DIAGNOSIS — T451X5A Adverse effect of antineoplastic and immunosuppressive drugs, initial encounter: Secondary | ICD-10-CM

## 2018-06-12 LAB — CBC WITH DIFFERENTIAL/PLATELET
BASOS PCT: 0 %
Basophils Absolute: 0 10*3/uL (ref 0–0.1)
Eosinophils Absolute: 0.1 10*3/uL (ref 0–0.7)
Eosinophils Relative: 1 %
HCT: 33.6 % — ABNORMAL LOW (ref 35.0–47.0)
Hemoglobin: 11.3 g/dL — ABNORMAL LOW (ref 12.0–16.0)
LYMPHS ABS: 1 10*3/uL (ref 1.0–3.6)
Lymphocytes Relative: 14 %
MCH: 28.6 pg (ref 26.0–34.0)
MCHC: 33.5 g/dL (ref 32.0–36.0)
MCV: 85.2 fL (ref 80.0–100.0)
MONO ABS: 0.3 10*3/uL (ref 0.2–0.9)
MONOS PCT: 5 %
NEUTROS ABS: 5.7 10*3/uL (ref 1.4–6.5)
Neutrophils Relative %: 80 %
Platelets: 260 10*3/uL (ref 150–440)
RBC: 3.95 MIL/uL (ref 3.80–5.20)
RDW: 15.3 % — AB (ref 11.5–14.5)
WBC: 7.1 10*3/uL (ref 3.6–11.0)

## 2018-06-12 LAB — COMPREHENSIVE METABOLIC PANEL
ALBUMIN: 3.5 g/dL (ref 3.5–5.0)
ALK PHOS: 60 U/L (ref 38–126)
ALT: 26 U/L (ref 0–44)
AST: 21 U/L (ref 15–41)
Anion gap: 9 (ref 5–15)
BUN: 13 mg/dL (ref 8–23)
CALCIUM: 9.3 mg/dL (ref 8.9–10.3)
CHLORIDE: 101 mmol/L (ref 98–111)
CO2: 29 mmol/L (ref 22–32)
CREATININE: 0.78 mg/dL (ref 0.44–1.00)
GFR calc Af Amer: >60 mL/min (ref >60)
GFR calc non Af Amer: >60 mL/min (ref >60)
GLUCOSE: 103 mg/dL — AB (ref 70–99)
Potassium: 3.4 mmol/L — ABNORMAL LOW (ref 3.5–5.1)
SODIUM: 139 mmol/L (ref 135–145)
Total Bilirubin: 0.7 mg/dL (ref 0.3–1.2)
Total Protein: 7.1 g/dL (ref 6.5–8.1)

## 2018-06-12 MED ORDER — DEXAMETHASONE SODIUM PHOSPHATE 10 MG/ML IJ SOLN
10.0000 mg | Freq: Once | INTRAMUSCULAR | Status: AC
  Administered 2018-06-12: 10 mg via INTRAVENOUS
  Filled 2018-06-12: qty 1

## 2018-06-12 MED ORDER — CHLORHEXIDINE GLUCONATE 0.12 % MT SOLN: 15.0000 mL | Freq: Two times a day (BID) | OROMUCOSAL | 0 refills | Status: DC

## 2018-06-12 MED ORDER — SODIUM CHLORIDE 0.9 % IV SOLN
75.0000 mg/m2 | Freq: Once | INTRAVENOUS | Status: AC
  Administered 2018-06-12: 140 mg via INTRAVENOUS
  Filled 2018-06-12: qty 14

## 2018-06-12 MED ORDER — HEPARIN SOD (PORK) LOCK FLUSH 100 UNIT/ML IV SOLN
500.0000 [IU] | Freq: Once | INTRAVENOUS | Status: AC | PRN
  Administered 2018-06-12: 500 [IU]

## 2018-06-12 MED ORDER — PALONOSETRON HCL INJECTION 0.25 MG/5ML
0.2500 mg | Freq: Once | INTRAVENOUS | Status: AC
  Administered 2018-06-12: 0.25 mg via INTRAVENOUS
  Filled 2018-06-12: qty 5

## 2018-06-12 MED ORDER — SODIUM CHLORIDE 0.9 % IV SOLN
900.0000 mg | Freq: Once | INTRAVENOUS | Status: AC
  Administered 2018-06-12: 900 mg via INTRAVENOUS
  Filled 2018-06-12: qty 90

## 2018-06-12 MED ORDER — SODIUM CHLORIDE 0.9 % IV SOLN
Freq: Once | INTRAVENOUS | Status: AC
  Administered 2018-06-12: 10:00:00 via INTRAVENOUS
  Filled 2018-06-12: qty 250

## 2018-06-12 MED ORDER — POTASSIUM CHLORIDE CRYS ER 20 MEQ PO TBCR: 20.0000 meq | EXTENDED_RELEASE_TABLET | Freq: Every day | ORAL | 0 refills | Status: DC

## 2018-06-12 NOTE — Addendum Note (Signed)
Addended by: Earlie Server on: 06/12/2018 03:52 PM   Modules accepted: Orders

## 2018-06-12 NOTE — Progress Notes (Signed)
Hematology/Oncology Consult note [facility] Telephone:([phone] [phone]   Patient Care Team: Valerie Roys, DO as PCP - General (Family Medicine) Thornton Park, MD as Referring Physician (Orthopedic Surgery) Earlie Server, MD as Medical Oncologist (Medical Oncology)  REFERRING PROVIDER: Valerie Roys, DO REASON FOR VISIT Follow up for treatment of Breast cancer, assess of chemotherapy toxicity.  HISTORY OF PRESENTING ILLNESS:  Wendy Marsh is a  62 y.o.  female with PMH listed below who was referred to me for evaluation of breast cancer Patient report feeling right breast mass for about 1 month. She mentioned to primary care provider and had diagnostic mammogram Mammogram showed right breast 6 o'clock axis breast mass, 2.9 cm corresponding to area of concern.  Target [place] was performed showed irregular hypoechoic mass in right breast Right axilla shows no enlarged or morphologically abnormal lymph nodes.  Breast mass was biopsied, and pathology showed invasive mammary carcinoma.  Grade 3 , ER/PR- HER2 -.  LVI suspicious, favor present.   Nipple discharge: denies.  Breast Skin changes, denies Family history: sister has history of breast cancer, father deceased from esophageal cancer. ? M grandmother breast cancer.   History of radiation to chest: denies.  Previous breast surgery:   Patient presents with multiple family members including husband, three daughters, and son in Sports coach.  She walks with a walker. Chronic right knee pain due to osteoarthritis. Paroxymal A fib, on metoprolol for rate control.  Asprin '81mg'$  daily  #  baseline MUGA testing done and reviewed systolic function depression, LVEF 45%. Given that Adriamycin has cardiac toxicity, potentially can lead to irreversible cardiomyopathy, I discussed with patient about using non-anthracycline-containing regimen. Would recommend Docetaxel '75mg'$ /m2 and Carboplatin every 3 weeks for  4-6 cycles. [ data from Denton et al 2018 Clin Cancer Res, Pathological Response and Survival in Triple-Negative Breast Cancer Following Neoadjuvant Carboplatin plus Docetaxel. Stage I-III TNBC patients, Neoadjuvant carboplatin (AUC6) plus docetaxel (75 mg/m2) every 21 days  6 cycles. pCR was 55% and Residual cancer burden (RCB) were 13%. Excellent Three-year RFS was 90% in patients with pCR and 66% in those without pCR]   # # Family history of breast cancer plus personal history of triple negative breast cancer. Testing did not reveal a pathogenic mutation in any of the genes analyzed.  BRCA negative.  VUS was detected. Does not change management.   # During the interval patient was also symmetrical ask for evaluation of mildly decreased systolic function.  Reviewed Dr. Heywood Footman note, no chemotherapy restriction.  At the lowest risk possible for cardiovascular complications.  Echocardiogram was repeated by Dr. Jose Persia showed normal EF of 55%.  Currently there is no evidence of active and or significant angina and or congestive heart failure. She was also recommended by cardiology to hold flecainide 2 to 3 days before and after chemo if Zofran will be used to reduce QT interval changes and interaction.  INTERVAL HISTORY Wendy Marsh is a 62 y.o. female who has above history reviewed by me today presents for assessment prior to adjuvant chemotherapy for breast cancer.  S/p cycle 1 Carboplatin and Docetaxel.   # Chemotherapy, she was seen by NP during the interval after cycle 1 chemotherapy due to nausea and diarrhea, dehydration and was managed with IV fluid and IV zofran. Symptoms improved afterwards.  # Diarrhea has resolved.   Review of Systems  Constitutional: Negative for chills, fever, malaise/fatigue and weight loss.  HENT: Negative for nosebleeds and  sore throat.   Eyes: Negative for double vision, photophobia and redness.  Respiratory: Negative for cough,  shortness of breath and wheezing.   Cardiovascular: Negative for chest pain, palpitations and orthopnea.  Gastrointestinal: Negative for abdominal pain, blood in stool, nausea and vomiting.  Genitourinary: Negative for dysuria.  Musculoskeletal: Negative for back pain, joint pain, myalgias and neck pain.       Chronic right knee pain.  Skin: Negative for itching and rash.  Neurological: Negative for dizziness, tingling and tremors.  Endo/Heme/Allergies: Negative for environmental allergies. Does not bruise/bleed easily.  Psychiatric/Behavioral: Negative for depression.    MEDICAL HISTORY:  Past Medical History:  Diagnosis Date  . Arthritis   . Atrial fibrillation ([place])   . Dysrhythmia    afib  . Hyperlipidemia   . Hypertension   . Hypothyroidism   . Lumbago   . Osteoporosis   . Thyroid disease     SURGICAL HISTORY: Past Surgical History:  Procedure Laterality Date  . BREAST BIOPSY  05/02/2018  . JOINT REPLACEMENT Left    tkr  . PORTACATH PLACEMENT Right 05/11/2018   Procedure: INSERTION  I.J PORT-A-CATH;  Surgeon: Robert Bellow, MD;  Location: [facility] ORS;  Service: General;  Laterality: Right;  . REPLACEMENT TOTAL KNEE      SOCIAL HISTORY: Social History   Socioeconomic History  . Marital status: Married    Spouse name: jerry  . Number of children: 3  . Years of education: Not on file  . Highest education level: Some college, no degree  Occupational History  . Not on file  Social Needs  . Financial resource strain: Somewhat hard  . Food insecurity:    Worry: Never true    Inability: Never true  . Transportation needs:    Medical: No    Non-medical: No  Tobacco Use  . Smoking status: Never Smoker  . Smokeless tobacco: Never Used  Substance and Sexual Activity  . Alcohol use: No  . Drug use: No  . Sexual activity: Yes    Birth control/protection: Post-menopausal  Lifestyle  . Physical activity:    Days per week: 0 days    Minutes per session: 0 min    . Stress: Only a little  Relationships  . Social connections:    Talks on phone: More than three times a week    Gets together: Twice a week    Attends religious service: Never    Active member of club or organization: No    Attends meetings of clubs or organizations: Not on file    Relationship status: Married  . Intimate partner violence:    Fear of current or ex partner: No    Emotionally abused: No    Physically abused: No    Forced sexual activity: No  Other Topics Concern  . Not on file  Social History Narrative  . Not on file    FAMILY HISTORY: Family History  Problem Relation Age of Onset  . Diabetes Mother   . Congestive Heart Failure Mother   . Breast cancer Maternal Grandmother        dx 62s; deceased 36s  . Cancer Maternal Uncle 51       deceased 53s  . Other Father        No information on father or paternal relatives  . Breast cancer Other 31       paternal half-sister; also esophageal ca at 46; currently 42  . Diabetes Other   . Hypertension Other   .  Kidney disease Other   . Thyroid disease Other     ALLERGIES:  is allergic to morphine and related and peanut-containing drug products.  MEDICATIONS:  Current Outpatient Medications  Medication Sig Dispense Refill  . aspirin 81 MG tablet Take 81 mg by mouth daily.    Marland Kitchen dexamethasone (DECADRON) 4 MG tablet Take 2 tablets (8 mg total) by mouth 2 (two) times daily. Then again the day after chemo for 3 days. 30 tablet 1  . EPINEPHrine 0.3 mg/0.3 mL IJ SOAJ injection   12  . flecainide (TAMBOCOR) 50 MG tablet Take 50 mg by mouth 2 (two) times daily.     Marland Kitchen levothyroxine (SYNTHROID, LEVOTHROID) 175 MCG tablet Take 1 tablet (175 mcg total) by mouth daily before breakfast. 90 tablet 0  . lidocaine-prilocaine (EMLA) cream Apply to affected area once 30 g 3  . lisinopril-hydrochlorothiazide (PRINZIDE,ZESTORETIC) 20-25 MG tablet Take 2 tablets by mouth daily. 180 tablet 3  . metoprolol succinate (TOPROL-XL) 50 MG  24 hr tablet Take 2 tablets (100 mg total) by mouth daily. Take with or immediately following a meal. 180 tablet 3  . naproxen sodium (ALEVE) 220 MG tablet Take 220 mg by mouth daily as needed (pain).     . Nutritional Supplements (JUICE PLUS FIBRE PO) Take 2 capsules by mouth daily. Fruit blend    . ondansetron (ZOFRAN) 8 MG tablet Take 1 tablet (8 mg total) by mouth 2 (two) times daily as needed for refractory nausea / vomiting. Start on day 3 after chemo. 30 tablet 1  . prochlorperazine (COMPAZINE) 10 MG tablet Take 1 tablet (10 mg total) by mouth every 6 (six) hours as needed (Nausea or vomiting). 30 tablet 1  . LORazepam (ATIVAN) 0.5 MG tablet Take 1 tablet (0.5 mg total) by mouth every 12 (twelve) hours as needed for anxiety (NAUSEA). (Patient not taking: Reported on 06/12/2018) 30 tablet 0  . potassium chloride SA (K-DUR,KLOR-CON) 20 MEQ tablet Take 1 tablet (20 mEq total) by mouth daily. 30 tablet 0   No current facility-administered medications for this visit.      PHYSICAL EXAMINATION: ECOG PERFORMANCE STATUS: 1 - Symptomatic but completely ambulatory Vitals:   06/12/18 0842  BP: (!) 155/76  Pulse: 70  Resp: 18  Temp: (!) 96.4 F (35.8 C)   Filed Weights   06/12/18 0842  Weight: (!) 302 lb 14.4 oz (137.4 kg)    Physical Exam  Constitutional: She is oriented to person, place, and time. She appears well-developed and well-nourished. No distress.  Obese,   HENT:  Head: Normocephalic and atraumatic.  Right Ear: External ear normal.  Left Ear: External ear normal.  Mouth/Throat: Oropharynx is clear and moist.  Eyes: Pupils are equal, round, and reactive to light. EOM are normal. No scleral icterus.  Neck: Normal range of motion. Neck supple.  Cardiovascular: Normal rate, regular rhythm and normal heart sounds.  Pulmonary/Chest: Effort normal. No respiratory distress. She has no wheezes.  Abdominal: Soft. Bowel sounds are normal. She exhibits no distension and no mass. There  is no tenderness.  Musculoskeletal: Normal range of motion. She exhibits edema. She exhibits no deformity.  Chronic lower extremity edema.  1+  Neurological: She is alert and oriented to person, place, and time. No cranial nerve deficit. Coordination normal.  Skin: Skin is warm and dry. No rash noted. No erythema.  Psychiatric: She has a normal mood and affect. Her behavior is normal. Thought content normal.      LABORATORY DATA:  I have reviewed the data as listed Lab Results  Component Value Date   WBC 7.1 06/12/2018   HGB 11.3 (L) 06/12/2018   HCT 33.6 (L) 06/12/2018   MCV 85.2 06/12/2018   PLT 260 06/12/2018   Recent Labs    05/28/18 1435 05/30/18 1350 06/12/18 0812  NA 135 139 139  K 3.5 3.8 3.4*  CL 94* 101 101  CO2 '29 28 29  '$ GLUCOSE 96 104* 103*  BUN '21 9 13  '$ CREATININE 0.88 0.92 0.78  CALCIUM 9.0 8.7* 9.3  GFRNONAA >60 >60 >60  GFRAA >60 >60 >60  PROT 6.9 6.8 7.1  ALBUMIN 3.5 3.5 3.5  AST 24 33 21  ALT 33 38 26  ALKPHOS 63 62 60  BILITOT 1.0 0.3 0.7      ASSESSMENT & PLAN:  1. 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 Hypokalemia   3. Encounter for antineoplastic chemotherapy   4. Diarrhea, unspecified type   5. Nausea without vomiting   6. Anemia due to antineoplastic chemotherapy   Cancer Staging Malignant neoplasm of lower-outer quadrant of right female breast [facility]) Staging form: Breast, AJCC 8th Edition - Clinical stage from 05/08/2018: Stage IIB (cT2, cN0, cM0, G3, ER-, PR-, HER2-) - Signed by Earlie Server, MD on 05/08/2018  #Triple negative breast cancer, Tolerated cycle 1 Carboplatin and Decetaxel with some side effects.  Labs reviewed and counts are acceptable to proceed cycle 2 Carboplatin and Decetaxel. Will use AUC 6 today.   # Hypokalemia, suggests patient to take potassium chloride 26mq daily.  # Nausea and diarrhea after last chemotherapy, will schedule patient to received Zofran, IV fluid for hydration Continue home anti  emetics as well as low dose Ativan as instructed.   # G-CSF support, Udenyca, she will receive on Day 3.   #Diarrhea, resolved. Chemotherapy induced. Monitor.  # Anemia, due to chemotherapy. Stable.   No orders of the defined types were placed in this encounter.   All questions were answered. The patient knows to call the clinic with any problems questions or concerns.  Return of visit: 3 weeks.  Total face to face encounter time for this patient visit was 25 min. >50% of the time was  spent in counseling and coordination of care.   .Earlie Server MD, PhD Hematology Oncology CAtlantic Surgery And Laser Center LLCat ACarolina Pines Regional Medical CenterPager- [phone]/07/2018

## 2018-06-12 NOTE — Progress Notes (Signed)
Patient here for follow up. No concerns voiced. Patient states having gout to right big toe.

## 2018-06-13 ENCOUNTER — Ambulatory Visit: Payer: BLUE CROSS/BLUE SHIELD

## 2018-06-13 ENCOUNTER — Telehealth: Payer: Self-pay | Admitting: *Deleted

## 2018-06-13 NOTE — Telephone Encounter (Signed)
Patient called asking why she is not scheduled for her Neulasta injection. I explained to her that she is scheduled for [facility] Friday which is the same as Neulasta and that it is scheduled within the 72 hour time frame of her chemotherapy treatment. She thanked ne for calling her back

## 2018-06-14 ENCOUNTER — Ambulatory Visit: Payer: BLUE CROSS/BLUE SHIELD

## 2018-06-15 ENCOUNTER — Inpatient Hospital Stay: Payer: BLUE CROSS/BLUE SHIELD

## 2018-06-15 VITALS — BP 122/78 | HR 108 | Temp 97.8°F | Resp 20

## 2018-06-15 DIAGNOSIS — C50919 Malignant neoplasm of unspecified site of unspecified female breast: Secondary | ICD-10-CM

## 2018-06-15 DIAGNOSIS — C50511 Malignant neoplasm of lower-outer quadrant of right female breast: Secondary | ICD-10-CM | POA: Diagnosis not present

## 2018-06-15 MED ORDER — PEGFILGRASTIM-CBQV 6 MG/0.6ML ~~LOC~~ SOSY
6.0000 mg | PREFILLED_SYRINGE | Freq: Once | SUBCUTANEOUS | Status: AC
  Administered 2018-06-15: 6 mg via SUBCUTANEOUS
  Filled 2018-06-15: qty 0.6

## 2018-06-15 MED ORDER — HEPARIN SOD (PORK) LOCK FLUSH 100 UNIT/ML IV SOLN
500.0000 [IU] | Freq: Once | INTRAVENOUS | Status: DC
  Filled 2018-06-15: qty 5

## 2018-06-15 MED ORDER — SODIUM CHLORIDE 0.9 % IV SOLN: Freq: Once | INTRAVENOUS | Status: DC

## 2018-06-15 MED ORDER — SODIUM CHLORIDE 0.9% FLUSH
10.0000 mL | Freq: Once | INTRAVENOUS | Status: AC
  Administered 2018-06-15: 10 mL via INTRAVENOUS
  Filled 2018-06-15: qty 10

## 2018-06-15 MED ORDER — ONDANSETRON HCL 4 MG/2ML IJ SOLN
8.0000 mg | Freq: Once | INTRAMUSCULAR | Status: AC
  Administered 2018-06-15: 8 mg via INTRAVENOUS
  Filled 2018-06-15: qty 4

## 2018-06-15 MED ORDER — SODIUM CHLORIDE 0.9 % IV SOLN
Freq: Once | INTRAVENOUS | Status: AC
  Administered 2018-06-15: 14:00:00 via INTRAVENOUS
  Filled 2018-06-15: qty 250

## 2018-06-25 ENCOUNTER — Other Ambulatory Visit: Payer: Self-pay | Admitting: Family Medicine

## 2018-06-25 DIAGNOSIS — I10 Essential (primary) hypertension: Secondary | ICD-10-CM

## 2018-07-03 ENCOUNTER — Other Ambulatory Visit: Payer: Self-pay

## 2018-07-03 ENCOUNTER — Inpatient Hospital Stay: Payer: BLUE CROSS/BLUE SHIELD | Attending: Oncology

## 2018-07-03 ENCOUNTER — Encounter: Payer: Self-pay | Admitting: Oncology

## 2018-07-03 ENCOUNTER — Inpatient Hospital Stay (HOSPITAL_BASED_OUTPATIENT_CLINIC_OR_DEPARTMENT_OTHER): Payer: BLUE CROSS/BLUE SHIELD | Admitting: Oncology

## 2018-07-03 ENCOUNTER — Inpatient Hospital Stay: Payer: BLUE CROSS/BLUE SHIELD

## 2018-07-03 VITALS — BP 153/81 | HR 72 | Temp 96.9°F | Resp 18 | Wt 305.4 lb

## 2018-07-03 DIAGNOSIS — I501 Left ventricular failure: Secondary | ICD-10-CM | POA: Insufficient documentation

## 2018-07-03 DIAGNOSIS — R197 Diarrhea, unspecified: Secondary | ICD-10-CM | POA: Diagnosis not present

## 2018-07-03 DIAGNOSIS — Z171 Estrogen receptor negative status [ER-]: Secondary | ICD-10-CM | POA: Diagnosis not present

## 2018-07-03 DIAGNOSIS — T451X5A Adverse effect of antineoplastic and immunosuppressive drugs, initial encounter: Secondary | ICD-10-CM

## 2018-07-03 DIAGNOSIS — C50919 Malignant neoplasm of unspecified site of unspecified female breast: Secondary | ICD-10-CM

## 2018-07-03 DIAGNOSIS — C50511 Malignant neoplasm of lower-outer quadrant of right female breast: Secondary | ICD-10-CM | POA: Insufficient documentation

## 2018-07-03 DIAGNOSIS — Z7982 Long term (current) use of aspirin: Secondary | ICD-10-CM | POA: Diagnosis not present

## 2018-07-03 DIAGNOSIS — R5383 Other fatigue: Secondary | ICD-10-CM | POA: Insufficient documentation

## 2018-07-03 DIAGNOSIS — Z5111 Encounter for antineoplastic chemotherapy: Secondary | ICD-10-CM | POA: Diagnosis not present

## 2018-07-03 DIAGNOSIS — I10 Essential (primary) hypertension: Secondary | ICD-10-CM | POA: Insufficient documentation

## 2018-07-03 DIAGNOSIS — R110 Nausea: Secondary | ICD-10-CM

## 2018-07-03 DIAGNOSIS — E876 Hypokalemia: Secondary | ICD-10-CM

## 2018-07-03 DIAGNOSIS — I110 Hypertensive heart disease with heart failure: Secondary | ICD-10-CM | POA: Insufficient documentation

## 2018-07-03 DIAGNOSIS — G620 Drug-induced polyneuropathy: Secondary | ICD-10-CM

## 2018-07-03 DIAGNOSIS — I4891 Unspecified atrial fibrillation: Secondary | ICD-10-CM | POA: Diagnosis not present

## 2018-07-03 DIAGNOSIS — E039 Hypothyroidism, unspecified: Secondary | ICD-10-CM

## 2018-07-03 DIAGNOSIS — D6481 Anemia due to antineoplastic chemotherapy: Secondary | ICD-10-CM

## 2018-07-03 LAB — CBC WITH DIFFERENTIAL/PLATELET
BASOS ABS: 0 10*3/uL (ref 0–0.1)
Basophils Relative: 0 %
Eosinophils Absolute: 0 10*3/uL (ref 0–0.7)
Eosinophils Relative: 0 %
HCT: 30.4 % — ABNORMAL LOW (ref 35.0–47.0)
Hemoglobin: 10.1 g/dL — ABNORMAL LOW (ref 12.0–16.0)
LYMPHS ABS: 1.8 10*3/uL (ref 1.0–3.6)
Lymphocytes Relative: 16 %
MCH: 29.1 pg (ref 26.0–34.0)
MCHC: 33.4 g/dL (ref 32.0–36.0)
MCV: 87.4 fL (ref 80.0–100.0)
Monocytes Absolute: 0.6 10*3/uL (ref 0.2–0.9)
Monocytes Relative: 5 %
NEUTROS PCT: 79 %
Neutro Abs: 9.2 10*3/uL — ABNORMAL HIGH (ref 1.4–6.5)
PLATELETS: 150 10*3/uL (ref 150–440)
RBC: 3.48 MIL/uL — ABNORMAL LOW (ref 3.80–5.20)
RDW: 17.7 % — AB (ref 11.5–14.5)
WBC: 11.6 10*3/uL — ABNORMAL HIGH (ref 3.6–11.0)

## 2018-07-03 LAB — COMPREHENSIVE METABOLIC PANEL
ALT: 24 U/L (ref 0–44)
ANION GAP: 10 (ref 5–15)
AST: 23 U/L (ref 15–41)
Albumin: 3.4 g/dL — ABNORMAL LOW (ref 3.5–5.0)
Alkaline Phosphatase: 76 U/L (ref 38–126)
BUN: 11 mg/dL (ref 8–23)
CHLORIDE: 104 mmol/L (ref 98–111)
CO2: 28 mmol/L (ref 22–32)
CREATININE: 0.75 mg/dL (ref 0.44–1.00)
Calcium: 9.2 mg/dL (ref 8.9–10.3)
GFR calc Af Amer: >60 mL/min (ref >60)
Glucose, Bld: 100 mg/dL — ABNORMAL HIGH (ref 70–99)
Potassium: 3.6 mmol/L (ref 3.5–5.1)
Sodium: 142 mmol/L (ref 135–145)
Total Bilirubin: 0.2 mg/dL — ABNORMAL LOW (ref 0.3–1.2)
Total Protein: 7.1 g/dL (ref 6.5–8.1)

## 2018-07-03 MED ORDER — PALONOSETRON HCL INJECTION 0.25 MG/5ML
0.2500 mg | Freq: Once | INTRAVENOUS | Status: AC
  Administered 2018-07-03: 0.25 mg via INTRAVENOUS
  Filled 2018-07-03: qty 5

## 2018-07-03 MED ORDER — SODIUM CHLORIDE 0.9 % IV SOLN: 10.0000 mg | Freq: Once | INTRAVENOUS | Status: DC

## 2018-07-03 MED ORDER — SODIUM CHLORIDE 0.9 % IV SOLN
Freq: Once | INTRAVENOUS | Status: AC
  Administered 2018-07-03: 10:00:00 via INTRAVENOUS
  Filled 2018-07-03: qty 250

## 2018-07-03 MED ORDER — SODIUM CHLORIDE 0.9 % IV SOLN
900.0000 mg | Freq: Once | INTRAVENOUS | Status: AC
  Administered 2018-07-03: 900 mg via INTRAVENOUS
  Filled 2018-07-03: qty 90

## 2018-07-03 MED ORDER — HEPARIN SOD (PORK) LOCK FLUSH 100 UNIT/ML IV SOLN
500.0000 [IU] | Freq: Once | INTRAVENOUS | Status: AC | PRN
  Administered 2018-07-03: 500 [IU]
  Filled 2018-07-03: qty 5

## 2018-07-03 MED ORDER — SODIUM CHLORIDE 0.9 % IV SOLN
75.0000 mg/m2 | Freq: Once | INTRAVENOUS | Status: AC
  Administered 2018-07-03: 140 mg via INTRAVENOUS
  Filled 2018-07-03: qty 14

## 2018-07-03 MED ORDER — DEXAMETHASONE SODIUM PHOSPHATE 10 MG/ML IJ SOLN
10.0000 mg | Freq: Once | INTRAMUSCULAR | Status: AC
  Administered 2018-07-03: 10 mg via INTRAVENOUS
  Filled 2018-07-03: qty 1

## 2018-07-03 NOTE — Progress Notes (Signed)
Patient here for follow up. Pt had nerve pain to heels after chemo and would like PRN gabapentin.

## 2018-07-03 NOTE — Progress Notes (Signed)
Hematology/Oncology Follow up note [facility] [phone] [phone]   Patient Care Team: Valerie Roys, DO as PCP - General (Family Medicine) Thornton Park, MD as Referring Physician (Orthopedic Surgery) Earlie Server, MD as Medical Oncologist (Medical Oncology)  REFERRING PROVIDER: Valerie Roys, DO REASON FOR VISIT Follow up for treatment of Breast cancer, assess of chemotherapy toxicity.  HISTORY OF PRESENTING ILLNESS:  Wendy Marsh is a  62 y.o.  female with PMH listed below who was referred to me for evaluation of breast cancer Patient report feeling right breast mass for about 1 month. She mentioned to primary care provider and had diagnostic mammogram Mammogram showed right breast 6 o'clock axis breast mass, 2.9 cm corresponding to area of concern.  Target [place] was performed showed irregular hypoechoic mass in right breast Right axilla shows no enlarged or morphologically abnormal lymph nodes.  Breast mass was biopsied, and pathology showed invasive mammary carcinoma.  Grade 3 , ER/PR- HER2 -.  LVI suspicious, favor present.   Nipple discharge: denies.  Breast Skin changes, denies Family history: sister has history of breast cancer, father deceased from esophageal cancer. ? M grandmother breast cancer.   History of radiation to chest: denies.  Previous breast surgery:   Patient presents with multiple family members including husband, three daughters, and son in Sports coach.  She walks with a walker. Chronic right knee pain due to osteoarthritis. Paroxymal A fib, on metoprolol for rate control.  Asprin 52m daily  #  baseline MUGA testing done and reviewed systolic function depression, LVEF 45%. Given that Adriamycin has cardiac toxicity, potentially can lead to irreversible cardiomyopathy, I discussed with patient about using non-anthracycline-containing regimen. Would recommend Docetaxel 785mm2 and Carboplatin every 3 weeks  for 4-6 cycles. [ data from PRRoanoket al 2018 Clin Cancer Res, Pathological Response and Survival in Triple-Negative Breast Cancer Following Neoadjuvant Carboplatin plus Docetaxel. Stage I-III TNBC patients, Neoadjuvant carboplatin (AUC6) plus docetaxel (75 mg/m2) every 21 days  6 cycles. pCR was 55% and Residual cancer burden (RCB) were 13%. Excellent Three-year RFS was 90% in patients with pCR and 66% in those without pCR]   # # Family history of breast cancer plus personal history of triple negative breast cancer. Testing did not reveal a pathogenic mutation in any of the genes analyzed.  BRCA negative.  VUS was detected. Does not change management.   # During the interval patient was also symmetrical ask for evaluation of mildly decreased systolic function.  Reviewed Dr. KoHeywood Footmanote, no chemotherapy restriction.  At the lowest risk possible for cardiovascular complications.  Echocardiogram was repeated by Dr. KoJose Persiahowed normal EF of 55%.  Currently there is no evidence of active and or significant angina and or congestive heart failure. She was also recommended by cardiology to hold flecainide 2 to 3 days before and after chemo if Zofran will be used to reduce QT interval changes and interaction.  INTERVAL HISTORY CaLAVORIS CANIZALESs a [age].o. female who has above history reviewed by me today presents for assessment prior to cycle 3 beoadjuvant chemotherapy.  S/p cycle 2 Carboplatin and Docetaxel.   # Chemotherapy, she reports tolerating chemotherapy well. No nausea, vomiting, diarrhea. Denies any mouth sore. Appetite is fair.  # Fatigue: reports worsening fatigue. Chronic onset, perisistent, no aggravating or improving factors, no associated symptoms. Extremely weak after cycle 2 chemotherapy.  # Left heel pain, acute on chronic for 3 days, occurred after udenyca. Pain resolved.  Review of Systems  Constitutional: Negative for chills, fever, malaise/fatigue and  weight loss.  HENT: Negative for nosebleeds and sore throat.   Eyes: Negative for double vision, photophobia and redness.  Respiratory: Negative for cough, shortness of breath and wheezing.   Cardiovascular: Negative for chest pain, palpitations and orthopnea.  Gastrointestinal: Negative for abdominal pain, blood in stool, nausea and vomiting.  Genitourinary: Negative for dysuria.  Musculoskeletal: Negative for back pain, joint pain, myalgias and neck pain.       Chronic right knee pain.  Skin: Negative for itching and rash.  Neurological: Negative for dizziness, tingling and tremors.  Endo/Heme/Allergies: Negative for environmental allergies. Does not bruise/bleed easily.  Psychiatric/Behavioral: Negative for depression.    MEDICAL HISTORY:  Past Medical History:  Diagnosis Date  . Arthritis   . Atrial fibrillation ([place])   . Dysrhythmia    afib  . Hyperlipidemia   . Hypertension   . Hypothyroidism   . Lumbago   . Osteoporosis   . Thyroid disease     SURGICAL HISTORY: Past Surgical History:  Procedure Laterality Date  . BREAST BIOPSY  05/02/2018  . JOINT REPLACEMENT Left    tkr  . PORTACATH PLACEMENT Right 05/11/2018   Procedure: INSERTION  I.J PORT-A-CATH;  Surgeon: Robert Bellow, MD;  Location: [facility] ORS;  Service: General;  Laterality: Right;  . REPLACEMENT TOTAL KNEE      SOCIAL HISTORY: Social History   Socioeconomic History  . Marital status: Married    Spouse name: jerry  . Number of children: 3  . Years of education: Not on file  . Highest education level: Some college, no degree  Occupational History  . Not on file  Social Needs  . Financial resource strain: Somewhat hard  . Food insecurity:    Worry: Never true    Inability: Never true  . Transportation needs:    Medical: No    Non-medical: No  Tobacco Use  . Smoking status: Never Smoker  . Smokeless tobacco: Never Used  Substance and Sexual Activity  . Alcohol use: No  . Drug use: No  .  Sexual activity: Yes    Birth control/protection: Post-menopausal  Lifestyle  . Physical activity:    Days per week: 0 days    Minutes per session: 0 min  . Stress: Only a little  Relationships  . Social connections:    Talks on phone: More than three times a week    Gets together: Twice a week    Attends religious service: Never    Active member of club or organization: No    Attends meetings of clubs or organizations: Not on file    Relationship status: Married  . Intimate partner violence:    Fear of current or ex partner: No    Emotionally abused: No    Physically abused: No    Forced sexual activity: No  Other Topics Concern  . Not on file  Social History Narrative  . Not on file    FAMILY HISTORY: Family History  Problem Relation Age of Onset  . Diabetes Mother   . Congestive Heart Failure Mother   . Breast cancer Maternal Grandmother        dx 60s; deceased 86s  . Cancer Maternal Uncle 43       deceased 28s  . Other Father        No information on father or paternal relatives  . Breast cancer Other 38  paternal half-sister; also esophageal ca at 71; currently 4  . Diabetes Other   . Hypertension Other   . Kidney disease Other   . Thyroid disease Other     ALLERGIES:  is allergic to morphine and related and peanut-containing drug products.  MEDICATIONS:  Current Outpatient Medications  Medication Sig Dispense Refill  . aspirin 81 MG tablet Take 81 mg by mouth daily.    Marland Kitchen dexamethasone (DECADRON) 4 MG tablet Take 2 tablets (8 mg total) by mouth 2 (two) times daily. Then again the day after chemo for 3 days. 30 tablet 1  . EPINEPHrine 0.3 mg/0.3 mL IJ SOAJ injection   12  . flecainide (TAMBOCOR) 50 MG tablet Take 50 mg by mouth 2 (two) times daily.     Marland Kitchen levothyroxine (SYNTHROID, LEVOTHROID) 175 MCG tablet Take 1 tablet (175 mcg total) by mouth daily before breakfast. 90 tablet 0  . lidocaine-prilocaine (EMLA) cream Apply to affected area once 30 g 3    . LORazepam (ATIVAN) 0.5 MG tablet Take 1 tablet (0.5 mg total) by mouth every 12 (twelve) hours as needed for anxiety (NAUSEA). 30 tablet 0  . metoprolol succinate (TOPROL-XL) 50 MG 24 hr tablet TAKE 1 TABLET BY MOUTH ONCE DAILY WITH  OR  IMMEDIATELY  FOLLOWING  A  MEAL 90 tablet 1  . Nutritional Supplements (JUICE PLUS FIBRE PO) Take 2 capsules by mouth daily. Fruit blend    . ondansetron (ZOFRAN) 8 MG tablet Take 1 tablet (8 mg total) by mouth 2 (two) times daily as needed for refractory nausea / vomiting. Start on day 3 after chemo. 30 tablet 1  . potassium chloride SA (K-DUR,KLOR-CON) 20 MEQ tablet Take 1 tablet (20 mEq total) by mouth daily. 30 tablet 0  . prochlorperazine (COMPAZINE) 10 MG tablet Take 1 tablet (10 mg total) by mouth every 6 (six) hours as needed (Nausea or vomiting). 30 tablet 1  . chlorhexidine (PERIDEX) 0.12 % solution Use as directed 15 mLs in the mouth or throat 2 (two) times daily. (Patient not taking: Reported on 07/03/2018) 473 mL 0  . lisinopril-hydrochlorothiazide (PRINZIDE,ZESTORETIC) 20-25 MG tablet Take 2 tablets by mouth daily. (Patient not taking: Reported on 07/03/2018) 180 tablet 3   No current facility-administered medications for this visit.      PHYSICAL EXAMINATION: ECOG PERFORMANCE STATUS: 1 - Symptomatic but completely ambulatory Vitals:   07/03/18 0906  BP: (!) 153/81  Pulse: 72  Resp: 18  Temp: (!) 96.9 F (36.1 C)  SpO2: 97%   Filed Weights   07/03/18 0906  Weight: (!) 305 lb 6.4 oz (138.5 kg)    Physical Exam  Constitutional: She is oriented to person, place, and time. She appears well-developed and well-nourished. No distress.  Obese,   HENT:  Head: Normocephalic and atraumatic.  Right Ear: External ear normal.  Left Ear: External ear normal.  Mouth/Throat: Oropharynx is clear and moist.  Eyes: Pupils are equal, round, and reactive to light. EOM are normal. No scleral icterus.  Neck: Normal range of motion. Neck supple.   Cardiovascular: Normal rate, regular rhythm and normal heart sounds.  Pulmonary/Chest: Effort normal. No respiratory distress. She has no wheezes.  Abdominal: Soft. Bowel sounds are normal. She exhibits no distension and no mass. There is no tenderness.  Musculoskeletal: Normal range of motion. She exhibits edema. She exhibits no deformity.  Chronic lower extremity edema.  1+  Neurological: She is alert and oriented to person, place, and time. No cranial nerve deficit. Coordination  normal.  Skin: Skin is warm and dry. No rash noted. No erythema.  Psychiatric: She has a normal mood and affect. Her behavior is normal. Thought content normal.  Breast exam was performed in seated and lying down position. Previous paplable 6 o'clock mass has decreased in size  No evidence of palpable left breast mass.  No palpable axillary lymph nodes bilaterally  LABORATORY DATA:  I have reviewed the data as listed Lab Results  Component Value Date   WBC 11.6 (H) 07/03/2018   HGB 10.1 (L) 07/03/2018   HCT 30.4 (L) 07/03/2018   MCV 87.4 07/03/2018   PLT 150 07/03/2018   Recent Labs    05/30/18 1350 06/12/18 0812 07/03/18 0850  NA 139 139 142  K 3.8 3.4* 3.6  CL 101 101 104  CO2 _0 GLUCOSE 104* 103* 100*  BUN _1 CREATININE 0.92 0.78 0.75  CALCIUM 8.7* 9.3 9.2  GFRNONAA >60 >60 >60  GFRAA >60 >60 >60  PROT 6.8 7.1 7.1  ALBUMIN 3.5 3.5 3.4*  AST 33 21 23  ALT 38 26 24  ALKPHOS 62 60 76  BILITOT 0.3 0.7 0.2*   RADIOGRAPHIC STUDIES: I have personally reviewed the radiological images as listed and agreed with the findings in the report. NM Cardiac Muga rest: Calculated LEFT ventricular ejection fraction equals 45%   ASSESSMENT & PLAN:  1. 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[place])   2. Encounter for antineoplastic chemotherapy   3. Anemia due to antineoplastic chemotherapy   Cancer Staging Malignant neoplasm of lower-outer quadrant of right female breast  [facility]) Staging form: Breast, AJCC 8th Edition - Clinical stage from 05/08/2018: Stage IIB (cT2, cN0, cM0, G3, ER-, PR-, HER2-) - Signed by Earlie Server, MD on 05/08/2018  #Triple negative breast cancer, clinically responding to treatment.  Tolerated 2 cycles Carboplatin and Decetaxel with some side effects.  Labs are reviewed and discussed with patient. and counts are acceptable to proceed cycle 3 Carboplatin and Decetaxel. Carboplatin AUC 6, will use adjusted weight for this cycle.   # Nausea and diarrhea after previous chemotherapy,will schedule patient to receive  IV fluid for hydration on Day 3.  Continue home anti emetics as well as low dose Ativan as instructed.   # G-CSF support, will give Udenyca on Day 3.  # Anemia, hemoglobin trended down, likely due to chemotherapy. Continue to monitor.    All questions were answered. The patient knows to call the clinic with any problems questions or concerns.  Return of visit: 3 weeks.   Earlie Server, MD, PhD Hematology Oncology [facility] Pager- [phone] 07/03/2018

## 2018-07-03 NOTE — Progress Notes (Signed)
To use ABW of 188lbs, BSA ~1.93 for taxotere dose per MD.   Carboplatin goal AUC was increased on 9/10 to 6. Dose capped at 900mg 

## 2018-07-04 ENCOUNTER — Ambulatory Visit: Payer: BLUE CROSS/BLUE SHIELD

## 2018-07-05 ENCOUNTER — Inpatient Hospital Stay: Payer: BLUE CROSS/BLUE SHIELD

## 2018-07-05 VITALS — BP 143/83 | HR 85 | Temp 97.0°F | Resp 20

## 2018-07-05 DIAGNOSIS — C50511 Malignant neoplasm of lower-outer quadrant of right female breast: Secondary | ICD-10-CM | POA: Diagnosis not present

## 2018-07-05 DIAGNOSIS — C50919 Malignant neoplasm of unspecified site of unspecified female breast: Secondary | ICD-10-CM

## 2018-07-05 MED ORDER — HEPARIN SOD (PORK) LOCK FLUSH 100 UNIT/ML IV SOLN
500.0000 [IU] | Freq: Once | INTRAVENOUS | Status: AC
  Administered 2018-07-05: 500 [IU] via INTRAVENOUS

## 2018-07-05 MED ORDER — PEGFILGRASTIM-CBQV 6 MG/0.6ML ~~LOC~~ SOSY
6.0000 mg | PREFILLED_SYRINGE | Freq: Once | SUBCUTANEOUS | Status: AC
  Administered 2018-07-05: 6 mg via SUBCUTANEOUS

## 2018-07-05 MED ORDER — SODIUM CHLORIDE 0.9 % IV SOLN
Freq: Once | INTRAVENOUS | Status: AC
  Administered 2018-07-05: 10:00:00 via INTRAVENOUS
  Filled 2018-07-05: qty 250

## 2018-07-24 ENCOUNTER — Encounter: Payer: Self-pay | Admitting: Oncology

## 2018-07-24 ENCOUNTER — Inpatient Hospital Stay: Payer: BLUE CROSS/BLUE SHIELD

## 2018-07-24 ENCOUNTER — Other Ambulatory Visit: Payer: Self-pay

## 2018-07-24 ENCOUNTER — Inpatient Hospital Stay (HOSPITAL_BASED_OUTPATIENT_CLINIC_OR_DEPARTMENT_OTHER): Payer: BLUE CROSS/BLUE SHIELD | Admitting: Oncology

## 2018-07-24 VITALS — BP 140/77 | HR 84 | Temp 97.6°F | Resp 18 | Wt 302.9 lb

## 2018-07-24 DIAGNOSIS — I10 Essential (primary) hypertension: Secondary | ICD-10-CM

## 2018-07-24 DIAGNOSIS — T451X5A Adverse effect of antineoplastic and immunosuppressive drugs, initial encounter: Secondary | ICD-10-CM

## 2018-07-24 DIAGNOSIS — E876 Hypokalemia: Secondary | ICD-10-CM | POA: Diagnosis not present

## 2018-07-24 DIAGNOSIS — I501 Left ventricular failure: Secondary | ICD-10-CM | POA: Diagnosis not present

## 2018-07-24 DIAGNOSIS — D6481 Anemia due to antineoplastic chemotherapy: Secondary | ICD-10-CM

## 2018-07-24 DIAGNOSIS — C50511 Malignant neoplasm of lower-outer quadrant of right female breast: Secondary | ICD-10-CM

## 2018-07-24 DIAGNOSIS — M818 Other osteoporosis without current pathological fracture: Secondary | ICD-10-CM

## 2018-07-24 DIAGNOSIS — Z5111 Encounter for antineoplastic chemotherapy: Secondary | ICD-10-CM

## 2018-07-24 DIAGNOSIS — E039 Hypothyroidism, unspecified: Secondary | ICD-10-CM

## 2018-07-24 DIAGNOSIS — C50919 Malignant neoplasm of unspecified site of unspecified female breast: Secondary | ICD-10-CM

## 2018-07-24 DIAGNOSIS — G8929 Other chronic pain: Secondary | ICD-10-CM

## 2018-07-24 LAB — COMPREHENSIVE METABOLIC PANEL
ALK PHOS: 74 U/L (ref 38–126)
ALT: 23 U/L (ref 0–44)
AST: 28 U/L (ref 15–41)
Albumin: 3.6 g/dL (ref 3.5–5.0)
Anion gap: 11 (ref 5–15)
BUN: 12 mg/dL (ref 8–23)
CALCIUM: 9.2 mg/dL (ref 8.9–10.3)
CHLORIDE: 100 mmol/L (ref 98–111)
CO2: 30 mmol/L (ref 22–32)
Creatinine, Ser: 0.78 mg/dL (ref 0.44–1.00)
GFR calc Af Amer: >60 mL/min (ref >60)
GFR calc non Af Amer: >60 mL/min (ref >60)
Glucose, Bld: 138 mg/dL — ABNORMAL HIGH (ref 70–99)
Potassium: 3.3 mmol/L — ABNORMAL LOW (ref 3.5–5.1)
SODIUM: 141 mmol/L (ref 135–145)
Total Bilirubin: 0.6 mg/dL (ref 0.3–1.2)
Total Protein: 7 g/dL (ref 6.5–8.1)

## 2018-07-24 LAB — CBC WITH DIFFERENTIAL/PLATELET
Abs Immature Granulocytes: 0.15 10*3/uL — ABNORMAL HIGH (ref 0.00–0.07)
BASOS ABS: 0 10*3/uL (ref 0.0–0.1)
Basophils Relative: 0 %
EOS ABS: 0 10*3/uL (ref 0.0–0.5)
EOS PCT: 0 %
HEMATOCRIT: 28.8 % — AB (ref 36.0–46.0)
HEMOGLOBIN: 9.2 g/dL — AB (ref 12.0–15.0)
Immature Granulocytes: 2 %
LYMPHS ABS: 1.8 10*3/uL (ref 0.7–4.0)
LYMPHS PCT: 21 %
MCH: 29.5 pg (ref 26.0–34.0)
MCHC: 31.9 g/dL (ref 30.0–36.0)
MCV: 92.3 fL (ref 80.0–100.0)
MONO ABS: 0.7 10*3/uL (ref 0.1–1.0)
MONOS PCT: 7 %
NRBC: 0.2 % (ref 0.0–0.2)
Neutro Abs: 6.2 10*3/uL (ref 1.7–7.7)
Neutrophils Relative %: 70 %
Platelets: 148 10*3/uL — ABNORMAL LOW (ref 150–400)
RBC: 3.12 MIL/uL — ABNORMAL LOW (ref 3.87–5.11)
RDW: 22 % — AB (ref 11.5–15.5)
WBC: 8.9 10*3/uL (ref 4.0–10.5)

## 2018-07-24 MED ORDER — SODIUM CHLORIDE 0.9 % IV SOLN
Freq: Once | INTRAVENOUS | Status: AC
  Administered 2018-07-24: 10:00:00 via INTRAVENOUS
  Filled 2018-07-24: qty 250

## 2018-07-24 MED ORDER — SODIUM CHLORIDE 0.9 % IV SOLN
900.0000 mg | Freq: Once | INTRAVENOUS | Status: AC
  Administered 2018-07-24: 900 mg via INTRAVENOUS
  Filled 2018-07-24: qty 90

## 2018-07-24 MED ORDER — POTASSIUM CHLORIDE CRYS ER 20 MEQ PO TBCR: 20.0000 meq | EXTENDED_RELEASE_TABLET | Freq: Every day | ORAL | 0 refills | Status: DC

## 2018-07-24 MED ORDER — HEPARIN SOD (PORK) LOCK FLUSH 100 UNIT/ML IV SOLN
500.0000 [IU] | Freq: Once | INTRAVENOUS | Status: AC | PRN
  Administered 2018-07-24: 500 [IU]
  Filled 2018-07-24: qty 5

## 2018-07-24 MED ORDER — SODIUM CHLORIDE 0.9 % IV SOLN
75.0000 mg/m2 | Freq: Once | INTRAVENOUS | Status: AC
  Administered 2018-07-24: 140 mg via INTRAVENOUS
  Filled 2018-07-24: qty 14

## 2018-07-24 MED ORDER — DEXAMETHASONE SODIUM PHOSPHATE 10 MG/ML IJ SOLN
10.0000 mg | Freq: Once | INTRAMUSCULAR | Status: AC
  Administered 2018-07-24: 10 mg via INTRAVENOUS
  Filled 2018-07-24: qty 1

## 2018-07-24 MED ORDER — PALONOSETRON HCL INJECTION 0.25 MG/5ML
0.2500 mg | Freq: Once | INTRAVENOUS | Status: AC
  Administered 2018-07-24: 0.25 mg via INTRAVENOUS
  Filled 2018-07-24: qty 5

## 2018-07-24 NOTE — Progress Notes (Signed)
Hematology/Oncology Follow up note [facility] [phone] [phone]   Patient Care Team: Valerie Roys, DO as PCP - General (Family Medicine) Thornton Park, MD as Referring Physician (Orthopedic Surgery) Earlie Server, MD as Medical Oncologist (Medical Oncology)  REFERRING PROVIDER: Valerie Roys, DO REASON FOR VISIT Follow up for treatment of Breast cancer, assess of chemotherapy toxicity.  HISTORY OF PRESENTING ILLNESS:  Wendy Marsh is a  62 y.o.  female with PMH listed below who was referred to me for evaluation of breast cancer Patient report feeling right breast mass for about 1 month. She mentioned to primary care provider and had diagnostic mammogram Mammogram showed right breast 6 o'clock axis breast mass, 2.9 cm corresponding to area of concern.  Target [place] was performed showed irregular hypoechoic mass in right breast Right axilla shows no enlarged or morphologically abnormal lymph nodes.  Breast mass was biopsied, and pathology showed invasive mammary carcinoma.  Grade 3 , ER/PR- HER2 -.  LVI suspicious, favor present.   Nipple discharge: denies.  Breast Skin changes, denies Family history: sister has history of breast cancer, father deceased from esophageal cancer. ? M grandmother breast cancer.   History of radiation to chest: denies.  Previous breast surgery:   Patient presents with multiple family members including husband, three daughters, and son in Sports coach.  She walks with a walker. Chronic right knee pain due to osteoarthritis. Paroxymal A fib, on metoprolol for rate control.  Asprin '81mg'$  daily  #  baseline MUGA testing done and reviewed systolic function depression, LVEF 45%. Given that Adriamycin has cardiac toxicity, potentially can lead to irreversible cardiomyopathy, I discussed with patient about using non-anthracycline-containing regimen. Would recommend Docetaxel '75mg'$ /m2 and Carboplatin every 3 weeks  for 4-6 cycles. [ data from Elias-Fela Solis et al 2018 Clin Cancer Res, Pathological Response and Survival in Triple-Negative Breast Cancer Following Neoadjuvant Carboplatin plus Docetaxel. Stage I-III TNBC patients, Neoadjuvant carboplatin (AUC6) plus docetaxel (75 mg/m2) every 21 days  6 cycles. pCR was 55% and Residual cancer burden (RCB) were 13%. Excellent Three-year RFS was 90% in patients with pCR and 66% in those without pCR]   # # Family history of breast cancer plus personal history of triple negative breast cancer. Testing did not reveal a pathogenic mutation in any of the genes analyzed.  BRCA negative.  VUS was detected. Does not change management.   # During the interval patient was also symmetrical ask for evaluation of mildly decreased systolic function.  Reviewed Dr. Heywood Footman note, no chemotherapy restriction.  At the lowest risk possible for cardiovascular complications.  Echocardiogram was repeated by Dr. Jose Persia showed normal EF of 55%.  Currently there is no evidence of active and or significant angina and or congestive heart failure. She was also recommended by cardiology to hold flecainide 2 to 3 days before and after chemo if Zofran will be used to reduce QT interval changes and interaction.  INTERVAL HISTORY Wendy Marsh is a 62 y.o. female who has above history reviewed by me today presents for assessment prior to cycle 4 Neoadjuvant chemotherapy with carboplatin and Docetaxel.   # Chemotherapy, tolerates ok. manageable nausea, without vomiting. Denies any mouth sores.  Appetite is fair.  $Fatigue: reports worsening fatigue. Takes longer time to recover after each chemotherapy.   Review of Systems  Constitutional: Positive for malaise/fatigue. Negative for chills, fever and weight loss.  HENT: Negative for nosebleeds and sore throat.   Eyes: Negative for double vision,  photophobia and redness.  Respiratory: Negative for cough, shortness of breath and  wheezing.   Cardiovascular: Negative for chest pain, palpitations and orthopnea.  Gastrointestinal: Negative for abdominal pain, blood in stool, nausea and vomiting.  Genitourinary: Negative for dysuria.  Musculoskeletal: Negative for back pain, joint pain, myalgias and neck pain.       Chronic right knee pain.  Skin: Negative for itching and rash.  Neurological: Negative for dizziness, tingling and tremors.  Endo/Heme/Allergies: Negative for environmental allergies. Does not bruise/bleed easily.  Psychiatric/Behavioral: Negative for depression.    MEDICAL HISTORY:  Past Medical History:  Diagnosis Date  . Arthritis   . Atrial fibrillation ([place])   . Dysrhythmia    afib  . Hyperlipidemia   . Hypertension   . Hypothyroidism   . Lumbago   . Osteoporosis   . Thyroid disease     SURGICAL HISTORY: Past Surgical History:  Procedure Laterality Date  . BREAST BIOPSY  05/02/2018  . JOINT REPLACEMENT Left    tkr  . PORTACATH PLACEMENT Right 05/11/2018   Procedure: INSERTION  I.J PORT-A-CATH;  Surgeon: Robert Bellow, MD;  Location: [facility] ORS;  Service: General;  Laterality: Right;  . REPLACEMENT TOTAL KNEE      SOCIAL HISTORY: Social History   Socioeconomic History  . Marital status: Married    Spouse name: jerry  . Number of children: 3  . Years of education: Not on file  . Highest education level: Some college, no degree  Occupational History  . Not on file  Social Needs  . Financial resource strain: Somewhat hard  . Food insecurity:    Worry: Never true    Inability: Never true  . Transportation needs:    Medical: No    Non-medical: No  Tobacco Use  . Smoking status: Never Smoker  . Smokeless tobacco: Never Used  Substance and Sexual Activity  . Alcohol use: No  . Drug use: No  . Sexual activity: Yes    Birth control/protection: Post-menopausal  Lifestyle  . Physical activity:    Days per week: 0 days    Minutes per session: 0 min  . Stress: Only a little   Relationships  . Social connections:    Talks on phone: More than three times a week    Gets together: Twice a week    Attends religious service: Never    Active member of club or organization: No    Attends meetings of clubs or organizations: Not on file    Relationship status: Married  . Intimate partner violence:    Fear of current or ex partner: No    Emotionally abused: No    Physically abused: No    Forced sexual activity: No  Other Topics Concern  . Not on file  Social History Narrative  . Not on file    FAMILY HISTORY: Family History  Problem Relation Age of Onset  . Diabetes Mother   . Congestive Heart Failure Mother   . Breast cancer Maternal Grandmother        dx 38s; deceased 31s  . Cancer Maternal Uncle 30       deceased 66s  . Other Father        No information on father or paternal relatives  . Breast cancer Other 37       paternal half-sister; also esophageal ca at 70; currently 36  . Diabetes Other   . Hypertension Other   . Kidney disease Other   . Thyroid  disease Other     ALLERGIES:  is allergic to morphine and related and peanut-containing drug products.  MEDICATIONS:  Current Outpatient Medications  Medication Sig Dispense Refill  . aspirin 81 MG tablet Take 81 mg by mouth daily.    . chlorhexidine (PERIDEX) 0.12 % solution Use as directed 15 mLs in the mouth or throat 2 (two) times daily. 473 mL 0  . dexamethasone (DECADRON) 4 MG tablet Take 2 tablets (8 mg total) by mouth 2 (two) times daily. Then again the day after chemo for 3 days. 30 tablet 1  . EPINEPHrine 0.3 mg/0.3 mL IJ SOAJ injection   12  . flecainide (TAMBOCOR) 50 MG tablet Take 50 mg by mouth 2 (two) times daily.     Marland Kitchen lidocaine-prilocaine (EMLA) cream Apply to affected area once 30 g 3  . lisinopril-hydrochlorothiazide (PRINZIDE,ZESTORETIC) 20-25 MG tablet Take 2 tablets by mouth daily. 180 tablet 3  . LORazepam (ATIVAN) 0.5 MG tablet Take 1 tablet (0.5 mg total) by mouth every  12 (twelve) hours as needed for anxiety (NAUSEA). 30 tablet 0  . metoprolol succinate (TOPROL-XL) 50 MG 24 hr tablet TAKE 1 TABLET BY MOUTH ONCE DAILY WITH  OR  IMMEDIATELY  FOLLOWING  A  MEAL 90 tablet 1  . Nutritional Supplements (JUICE PLUS FIBRE PO) Take 2 capsules by mouth daily. Fruit blend    . ondansetron (ZOFRAN) 8 MG tablet Take 1 tablet (8 mg total) by mouth 2 (two) times daily as needed for refractory nausea / vomiting. Start on day 3 after chemo. 30 tablet 1  . prochlorperazine (COMPAZINE) 10 MG tablet Take 1 tablet (10 mg total) by mouth every 6 (six) hours as needed (Nausea or vomiting). 30 tablet 1  . levothyroxine (SYNTHROID, LEVOTHROID) 175 MCG tablet Take 1 tablet (175 mcg total) by mouth daily before breakfast. (Patient not taking: Reported on 07/24/2018) 90 tablet 0  . potassium chloride SA (K-DUR,KLOR-CON) 20 MEQ tablet Take 1 tablet (20 mEq total) by mouth daily. 30 tablet 0   No current facility-administered medications for this visit.      PHYSICAL EXAMINATION: ECOG PERFORMANCE STATUS: 1 - Symptomatic but completely ambulatory Vitals:   07/24/18 0859  BP: 140/77  Pulse: 84  Resp: 18  Temp: 97.6 F (36.4 C)   Filed Weights   07/24/18 0859  Weight: (!) 302 lb 14.4 oz (137.4 kg)    Physical Exam  Constitutional: She is oriented to person, place, and time. No distress.  Obese,   HENT:  Head: Normocephalic and atraumatic.  Right Ear: External ear normal.  Left Ear: External ear normal.  Mouth/Throat: Oropharynx is clear and moist.  Eyes: Pupils are equal, round, and reactive to light. EOM are normal. No scleral icterus.  Neck: Normal range of motion. Neck supple.  Cardiovascular: Normal rate, regular rhythm and normal heart sounds.  Pulmonary/Chest: Effort normal. No respiratory distress. She has no wheezes.  Abdominal: Soft. Bowel sounds are normal. She exhibits no distension and no mass. There is no tenderness.  Musculoskeletal: Normal range of motion.  She exhibits edema. She exhibits no deformity.  Chronic lower extremity edema.  1+  Neurological: She is alert and oriented to person, place, and time. No cranial nerve deficit. Coordination normal.  Skin: Skin is warm and dry. No rash noted. No erythema.  Psychiatric: She has a normal mood and affect. Her behavior is normal. Thought content normal.   LABORATORY DATA:  I have reviewed the data as listed Lab Results  Component  Value Date   WBC 8.9 07/24/2018   HGB 9.2 (L) 07/24/2018   HCT 28.8 (L) 07/24/2018   MCV 92.3 07/24/2018   PLT 148 (L) 07/24/2018   Recent Labs    06/12/18 0812 07/03/18 0850 07/24/18 0845  NA 139 142 141  K 3.4* 3.6 3.3*  CL 101 104 100  CO2 '29 28 30  '$ GLUCOSE 103* 100* 138*  BUN '13 11 12  '$ CREATININE 0.78 0.75 0.78  CALCIUM 9.3 9.2 9.2  GFRNONAA >60 >60 >60  GFRAA >60 >60 >60  PROT 7.1 7.1 7.0  ALBUMIN 3.5 3.4* 3.6  AST '21 23 28  '$ ALT '26 24 23  '$ ALKPHOS 60 76 74  BILITOT 0.7 0.2* 0.6   RADIOGRAPHIC STUDIES: I have personally reviewed the radiological images as listed and agreed with the findings in the report. NM Cardiac Muga rest: Calculated LEFT ventricular ejection fraction equals 45%   ASSESSMENT & PLAN:  1. 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 Hypokalemia   3. Encounter for antineoplastic chemotherapy   4. Anemia due to antineoplastic chemotherapy   5. Heart failure, left, with LVEF 41-49% (HCC)   Cancer Staging Malignant neoplasm of lower-outer quadrant of right female breast [facility]) Staging form: Breast, AJCC 8th Edition - Clinical stage from 05/08/2018: Stage IIB (cT2, cN0, cM0, G3, ER-, PR-, HER2-) - Signed by Earlie Server, MD on 05/08/2018  #Triple negative breast cancer, clinically responding to treatment.  Labs are reviewed and discussed with patient. Counts are acceptable to proceed with cycle 4 Carboplatin [adjusted weight] and Decetaxel  G-CSF support, will give Udenyca on Day 3.   # Nausea and diarrhea after  previous chemotherapy, plan schedule patient to receive  IV fluid for hydration on Day 3.  Continue home antiemetics, and low dose Ativan as instructed.   # Anemia, hemoglobin trended down, due to chemotherapy. Continue monitor. If she drops below 8 and symptomatic anemia.  # Hypokalemia, continue K dur. Refill sent to pharmacy.   Patient and her family members have a lot of questions about what to anticipate after patient completes chemotherapy and also asks role of radiation. We spent sufficient time to discuss many aspect of care, questions were answered to patient's satisfaction.  The patient knows to call the clinic with any problems questions or concerns.  Return of visit: 3 weeks.   Total face to face encounter time for this patient visit was 25 min. >50% of the time was  spent in counseling and coordination of care.   Earlie Server, MD, PhD Hematology Oncology [facility] at [facility] Pager- [phone] 07/24/2018

## 2018-07-24 NOTE — Progress Notes (Signed)
Patient here for follow up. She is out of potassium and is wanting to know if she needs a refill.

## 2018-07-26 ENCOUNTER — Inpatient Hospital Stay: Payer: BLUE CROSS/BLUE SHIELD

## 2018-07-26 DIAGNOSIS — C50511 Malignant neoplasm of lower-outer quadrant of right female breast: Secondary | ICD-10-CM | POA: Diagnosis not present

## 2018-07-26 DIAGNOSIS — C50919 Malignant neoplasm of unspecified site of unspecified female breast: Secondary | ICD-10-CM

## 2018-07-26 MED ORDER — HEPARIN SOD (PORK) LOCK FLUSH 100 UNIT/ML IV SOLN
500.0000 [IU] | Freq: Once | INTRAVENOUS | Status: AC
  Administered 2018-07-26: 500 [IU] via INTRAVENOUS

## 2018-07-26 MED ORDER — HEPARIN SOD (PORK) LOCK FLUSH 100 UNIT/ML IV SOLN
INTRAVENOUS | Status: AC
  Filled 2018-07-26: qty 5

## 2018-07-26 MED ORDER — SODIUM CHLORIDE 0.9 % IV SOLN
Freq: Once | INTRAVENOUS | Status: AC
  Administered 2018-07-26: 10:00:00 via INTRAVENOUS
  Filled 2018-07-26: qty 250

## 2018-07-26 MED ORDER — PEGFILGRASTIM-CBQV 6 MG/0.6ML ~~LOC~~ SOSY
6.0000 mg | PREFILLED_SYRINGE | Freq: Once | SUBCUTANEOUS | Status: AC
  Administered 2018-07-26: 6 mg via SUBCUTANEOUS
  Filled 2018-07-26: qty 0.6

## 2018-07-30 ENCOUNTER — Inpatient Hospital Stay: Payer: BLUE CROSS/BLUE SHIELD

## 2018-07-30 VITALS — BP 124/80 | HR 64 | Temp 96.9°F | Resp 19

## 2018-07-30 DIAGNOSIS — C50919 Malignant neoplasm of unspecified site of unspecified female breast: Secondary | ICD-10-CM

## 2018-07-30 DIAGNOSIS — C50511 Malignant neoplasm of lower-outer quadrant of right female breast: Secondary | ICD-10-CM | POA: Diagnosis not present

## 2018-07-30 MED ORDER — HEPARIN SOD (PORK) LOCK FLUSH 100 UNIT/ML IV SOLN
500.0000 [IU] | Freq: Once | INTRAVENOUS | Status: AC
  Administered 2018-07-30: 500 [IU] via INTRAVENOUS
  Filled 2018-07-30: qty 5

## 2018-07-30 MED ORDER — SODIUM CHLORIDE 0.9% FLUSH
10.0000 mL | INTRAVENOUS | Status: DC | PRN
  Administered 2018-07-30: 10 mL via INTRAVENOUS
  Filled 2018-07-30: qty 10

## 2018-07-30 MED ORDER — SODIUM CHLORIDE 0.9 % IV SOLN
Freq: Once | INTRAVENOUS | Status: AC
  Administered 2018-07-30: 14:00:00 via INTRAVENOUS
  Filled 2018-07-30: qty 250

## 2018-08-14 ENCOUNTER — Other Ambulatory Visit: Payer: Self-pay

## 2018-08-14 ENCOUNTER — Inpatient Hospital Stay: Payer: BLUE CROSS/BLUE SHIELD

## 2018-08-14 ENCOUNTER — Encounter: Payer: Self-pay | Admitting: Oncology

## 2018-08-14 ENCOUNTER — Inpatient Hospital Stay: Payer: BLUE CROSS/BLUE SHIELD | Attending: Oncology

## 2018-08-14 ENCOUNTER — Inpatient Hospital Stay (HOSPITAL_BASED_OUTPATIENT_CLINIC_OR_DEPARTMENT_OTHER): Payer: BLUE CROSS/BLUE SHIELD | Admitting: Oncology

## 2018-08-14 VITALS — BP 143/79 | HR 75 | Temp 97.4°F | Resp 18 | Wt 301.5 lb

## 2018-08-14 DIAGNOSIS — Z7982 Long term (current) use of aspirin: Secondary | ICD-10-CM | POA: Diagnosis not present

## 2018-08-14 DIAGNOSIS — D6959 Other secondary thrombocytopenia: Secondary | ICD-10-CM

## 2018-08-14 DIAGNOSIS — M25561 Pain in right knee: Secondary | ICD-10-CM

## 2018-08-14 DIAGNOSIS — M818 Other osteoporosis without current pathological fracture: Secondary | ICD-10-CM | POA: Diagnosis not present

## 2018-08-14 DIAGNOSIS — C50511 Malignant neoplasm of lower-outer quadrant of right female breast: Secondary | ICD-10-CM

## 2018-08-14 DIAGNOSIS — D6481 Anemia due to antineoplastic chemotherapy: Secondary | ICD-10-CM | POA: Diagnosis not present

## 2018-08-14 DIAGNOSIS — R197 Diarrhea, unspecified: Secondary | ICD-10-CM

## 2018-08-14 DIAGNOSIS — E039 Hypothyroidism, unspecified: Secondary | ICD-10-CM

## 2018-08-14 DIAGNOSIS — E876 Hypokalemia: Secondary | ICD-10-CM | POA: Diagnosis not present

## 2018-08-14 DIAGNOSIS — R110 Nausea: Secondary | ICD-10-CM | POA: Diagnosis not present

## 2018-08-14 DIAGNOSIS — Z171 Estrogen receptor negative status [ER-]: Secondary | ICD-10-CM | POA: Diagnosis not present

## 2018-08-14 DIAGNOSIS — C50919 Malignant neoplasm of unspecified site of unspecified female breast: Secondary | ICD-10-CM

## 2018-08-14 DIAGNOSIS — Z5111 Encounter for antineoplastic chemotherapy: Secondary | ICD-10-CM | POA: Insufficient documentation

## 2018-08-14 DIAGNOSIS — G8929 Other chronic pain: Secondary | ICD-10-CM

## 2018-08-14 DIAGNOSIS — I501 Left ventricular failure: Secondary | ICD-10-CM

## 2018-08-14 DIAGNOSIS — I10 Essential (primary) hypertension: Secondary | ICD-10-CM | POA: Diagnosis not present

## 2018-08-14 DIAGNOSIS — D696 Thrombocytopenia, unspecified: Secondary | ICD-10-CM

## 2018-08-14 DIAGNOSIS — Z17421 Hormone receptor negative with human epidermal growth factor receptor 2 negative status: Secondary | ICD-10-CM

## 2018-08-14 DIAGNOSIS — Z79899 Other long term (current) drug therapy: Secondary | ICD-10-CM | POA: Diagnosis not present

## 2018-08-14 DIAGNOSIS — T451X5A Adverse effect of antineoplastic and immunosuppressive drugs, initial encounter: Secondary | ICD-10-CM

## 2018-08-14 DIAGNOSIS — R0602 Shortness of breath: Secondary | ICD-10-CM | POA: Insufficient documentation

## 2018-08-14 LAB — COMPREHENSIVE METABOLIC PANEL
ALBUMIN: 3.3 g/dL — AB (ref 3.5–5.0)
ALK PHOS: 66 U/L (ref 38–126)
ALT: 19 U/L (ref 0–44)
ANION GAP: 11 (ref 5–15)
AST: 24 U/L (ref 15–41)
BUN: 12 mg/dL (ref 8–23)
CALCIUM: 8.8 mg/dL — AB (ref 8.9–10.3)
CHLORIDE: 102 mmol/L (ref 98–111)
CO2: 28 mmol/L (ref 22–32)
Creatinine, Ser: 0.78 mg/dL (ref 0.44–1.00)
GFR calc non Af Amer: >60 mL/min (ref >60)
GLUCOSE: 97 mg/dL (ref 70–99)
POTASSIUM: 3.3 mmol/L — AB (ref 3.5–5.1)
SODIUM: 141 mmol/L (ref 135–145)
Total Bilirubin: 0.3 mg/dL (ref 0.3–1.2)
Total Protein: 6.8 g/dL (ref 6.5–8.1)

## 2018-08-14 LAB — CBC WITH DIFFERENTIAL/PLATELET
Abs Immature Granulocytes: 0.08 10*3/uL — ABNORMAL HIGH (ref 0.00–0.07)
Basophils Absolute: 0 10*3/uL (ref 0.0–0.1)
Basophils Relative: 0 %
EOS ABS: 0.2 10*3/uL (ref 0.0–0.5)
EOS PCT: 3 %
HEMATOCRIT: 24 % — AB (ref 36.0–46.0)
HEMOGLOBIN: 7.6 g/dL — AB (ref 12.0–15.0)
Immature Granulocytes: 1 %
LYMPHS ABS: 1.5 10*3/uL (ref 0.7–4.0)
LYMPHS PCT: 24 %
MCH: 31.4 pg (ref 26.0–34.0)
MCHC: 31.7 g/dL (ref 30.0–36.0)
MCV: 99.2 fL (ref 80.0–100.0)
MONO ABS: 0.4 10*3/uL (ref 0.1–1.0)
Monocytes Relative: 7 %
Neutro Abs: 3.9 10*3/uL (ref 1.7–7.7)
Neutrophils Relative %: 65 %
Platelets: 92 10*3/uL — ABNORMAL LOW (ref 150–400)
RBC: 2.42 MIL/uL — ABNORMAL LOW (ref 3.87–5.11)
RDW: 23.1 % — AB (ref 11.5–15.5)
WBC: 6.1 10*3/uL (ref 4.0–10.5)
nRBC: 0 % (ref 0.0–0.2)

## 2018-08-14 MED ORDER — SODIUM CHLORIDE 0.9% FLUSH
10.0000 mL | INTRAVENOUS | Status: DC | PRN
  Administered 2018-08-14: 10 mL via INTRAVENOUS
  Filled 2018-08-14: qty 10

## 2018-08-14 MED ORDER — HEPARIN SOD (PORK) LOCK FLUSH 100 UNIT/ML IV SOLN
500.0000 [IU] | Freq: Once | INTRAVENOUS | Status: AC
  Administered 2018-08-14: 500 [IU] via INTRAVENOUS
  Filled 2018-08-14: qty 5

## 2018-08-14 MED ORDER — DEXAMETHASONE 4 MG PO TABS: 8.0000 mg | ORAL_TABLET | Freq: Two times a day (BID) | ORAL | 0 refills | Status: DC

## 2018-08-14 NOTE — Progress Notes (Addendum)
Hematology/Oncology Follow up note [facility] [phone] [phone]   Patient Care Team: Valerie Roys, DO as PCP - General (Family Medicine) Thornton Park, MD as Referring Physician (Orthopedic Surgery) Earlie Server, MD as Medical Oncologist (Medical Oncology)  REFERRING PROVIDER: Valerie Roys, DO REASON FOR VISIT Follow up for treatment of Breast cancer, assess of chemotherapy toxicity.  HISTORY OF PRESENTING ILLNESS:  Wendy Marsh is a  62 y.o.  female with PMH listed below who was referred to me for evaluation of breast cancer Patient report feeling right breast mass for about 1 month. She mentioned to primary care provider and had diagnostic mammogram Mammogram showed right breast 6 o'clock axis breast mass, 2.9 cm corresponding to area of concern.  Target [place] was performed showed irregular hypoechoic mass in right breast Right axilla shows no enlarged or morphologically abnormal lymph nodes.  Breast mass was biopsied, and pathology showed invasive mammary carcinoma.  Grade 3 , ER/PR- HER2 -.  LVI suspicious, favor present.   Nipple discharge: denies.  Breast Skin changes, denies Family history: sister has history of breast cancer, father deceased from esophageal cancer. ? M grandmother breast cancer.   History of radiation to chest: denies.  Previous breast surgery:   Patient presents with multiple family members including husband, three daughters, and son in Sports coach.  She walks with a walker. Chronic right knee pain due to osteoarthritis. Paroxymal A fib, on metoprolol for rate control.  Asprin '81mg'$  daily  #  baseline MUGA testing done and reviewed systolic function depression, LVEF 45%. Given that Adriamycin has cardiac toxicity, potentially can lead to irreversible cardiomyopathy, I discussed with patient about using non-anthracycline-containing regimen. Would recommend Docetaxel '75mg'$ /m2 and Carboplatin every 3 weeks  for 4-6 cycles. [ data from [place] Willow et al 2018 Clin Cancer Res, Pathological Response and Survival in Triple-Negative Breast Cancer Following Neoadjuvant Carboplatin plus Docetaxel. Stage I-III TNBC patients, Neoadjuvant carboplatin (AUC6) plus docetaxel (75 mg/m2) every 21 days  6 cycles. pCR was 55% and Residual cancer burden (RCB) were 13%. Excellent Three-year RFS was 90% in patients with pCR and 66% in those without pCR]   # # Family history of breast cancer plus personal history of triple negative breast cancer. Testing did not reveal a pathogenic mutation in any of the genes analyzed.  BRCA negative.  VUS was detected. Does not change management.   # During the interval patient was also symmetrical ask for evaluation of mildly decreased systolic function.  Reviewed Dr. Heywood Footman note, no chemotherapy restriction.  At the lowest risk possible for cardiovascular complications.  Echocardiogram was repeated by Dr. Jose Persia showed normal EF of 55%.  Currently there is no evidence of active and or significant angina and or congestive heart failure. She was also recommended by cardiology to hold flecainide 2 to 3 days before and after chemo if Zofran will be used to reduce QT interval changes and interaction.  INTERVAL HISTORY Wendy Marsh is a 62 y.o. female who has above history reviewed by me today presents for assessment prior to cycle 5 Neoadjuvant chemotherapy with carboplatin and Docetaxel.   # Fatigue: reports worsening fatigue. Chronic onset, perisistent, no aggravating or improving factors, no associated symptoms. # SOB, exacerbated with exertion. #Chemotherapy, tolerates well.  Manageable nausea.  No vomiting.  Denies any mouth sores.  Appetite is fair.    Review of Systems  Constitutional: Positive for malaise/fatigue. Negative for chills, fever and weight loss.  HENT: Negative for  nosebleeds and sore throat.   Eyes: Negative for double vision, photophobia and  redness.  Respiratory: Positive for shortness of breath. Negative for cough and wheezing.   Cardiovascular: Negative for chest pain, palpitations and orthopnea.  Gastrointestinal: Negative for abdominal pain, blood in stool, nausea and vomiting.  Genitourinary: Negative for dysuria.  Musculoskeletal: Negative for back pain, joint pain, myalgias and neck pain.       Chronic right knee pain.  Skin: Negative for itching and rash.  Neurological: Negative for dizziness, tingling and tremors.  Endo/Heme/Allergies: Negative for environmental allergies. Does not bruise/bleed easily.  Psychiatric/Behavioral: Negative for depression.    MEDICAL HISTORY:  Past Medical History:  Diagnosis Date  . Arthritis   . Atrial fibrillation ([place])   . Dysrhythmia    afib  . Hyperlipidemia   . Hypertension   . Hypothyroidism   . Lumbago   . Osteoporosis   . Thyroid disease     SURGICAL HISTORY: Past Surgical History:  Procedure Laterality Date  . BREAST BIOPSY  05/02/2018  . JOINT REPLACEMENT Left    tkr  . PORTACATH PLACEMENT Right 05/11/2018   Procedure: INSERTION  I.J PORT-A-CATH;  Surgeon: Robert Bellow, MD;  Location: [facility] ORS;  Service: General;  Laterality: Right;  . REPLACEMENT TOTAL KNEE      SOCIAL HISTORY: Social History   Socioeconomic History  . Marital status: Married    Spouse name: jerry  . Number of children: 3  . Years of education: Not on file  . Highest education level: Some college, no degree  Occupational History  . Not on file  Social Needs  . Financial resource strain: Somewhat hard  . Food insecurity:    Worry: Never true    Inability: Never true  . Transportation needs:    Medical: No    Non-medical: No  Tobacco Use  . Smoking status: Never Smoker  . Smokeless tobacco: Never Used  Substance and Sexual Activity  . Alcohol use: No  . Drug use: No  . Sexual activity: Yes    Birth control/protection: Post-menopausal  Lifestyle  . Physical activity:      Days per week: 0 days    Minutes per session: 0 min  . Stress: Only a little  Relationships  . Social connections:    Talks on phone: More than three times a week    Gets together: Twice a week    Attends religious service: Never    Active member of club or organization: No    Attends meetings of clubs or organizations: Not on file    Relationship status: Married  . Intimate partner violence:    Fear of current or ex partner: No    Emotionally abused: No    Physically abused: No    Forced sexual activity: No  Other Topics Concern  . Not on file  Social History Narrative  . Not on file    FAMILY HISTORY: Family History  Problem Relation Age of Onset  . Diabetes Mother   . Congestive Heart Failure Mother   . Breast cancer Maternal Grandmother        dx 61s; deceased 56s  . Cancer Maternal Uncle 44       deceased 49s  . Other Father        No information on father or paternal relatives  . Breast cancer Other 79       paternal half-sister; also esophageal ca at 78; currently 19  . Diabetes Other   .  Hypertension Other   . Kidney disease Other   . Thyroid disease Other     ALLERGIES:  is allergic to morphine and related and peanut-containing drug products.  MEDICATIONS:  Current Outpatient Medications  Medication Sig Dispense Refill  . aspirin 81 MG tablet Take 81 mg by mouth daily.    . chlorhexidine (PERIDEX) 0.12 % solution Use as directed 15 mLs in the mouth or throat 2 (two) times daily. 473 mL 0  . dexamethasone (DECADRON) 4 MG tablet Take 2 tablets (8 mg total) by mouth 2 (two) times daily. Then again the day after chemo for 3 days. 25 tablet 0  . EPINEPHrine 0.3 mg/0.3 mL IJ SOAJ injection   12  . flecainide (TAMBOCOR) 50 MG tablet Take 50 mg by mouth 2 (two) times daily.     Marland Kitchen levothyroxine (SYNTHROID, LEVOTHROID) 175 MCG tablet Take 1 tablet (175 mcg total) by mouth daily before breakfast. 90 tablet 0  . lidocaine-prilocaine (EMLA) cream Apply to affected  area once 30 g 3  . lisinopril-hydrochlorothiazide (PRINZIDE,ZESTORETIC) 20-25 MG tablet Take 2 tablets by mouth daily. 180 tablet 3  . LORazepam (ATIVAN) 0.5 MG tablet Take 1 tablet (0.5 mg total) by mouth every 12 (twelve) hours as needed for anxiety (NAUSEA). 30 tablet 0  . metoprolol succinate (TOPROL-XL) 50 MG 24 hr tablet TAKE 1 TABLET BY MOUTH ONCE DAILY WITH  OR  IMMEDIATELY  FOLLOWING  A  MEAL 90 tablet 1  . Nutritional Supplements (JUICE PLUS FIBRE PO) Take 2 capsules by mouth daily. Fruit blend    . ondansetron (ZOFRAN) 8 MG tablet Take 1 tablet (8 mg total) by mouth 2 (two) times daily as needed for refractory nausea / vomiting. Start on day 3 after chemo. 30 tablet 1  . potassium chloride SA (K-DUR,KLOR-CON) 20 MEQ tablet Take 1 tablet (20 mEq total) by mouth daily. 30 tablet 0  . prochlorperazine (COMPAZINE) 10 MG tablet Take 1 tablet (10 mg total) by mouth every 6 (six) hours as needed (Nausea or vomiting). 30 tablet 1   No current facility-administered medications for this visit.    Facility-Administered Medications Ordered in Other Visits  Medication Dose Route Frequency Provider Last Rate Last Dose  . sodium chloride flush (NS) 0.9 % injection 10 mL  10 mL Intravenous PRN Earlie Server, MD   10 mL at 08/14/18 0839     PHYSICAL EXAMINATION: ECOG PERFORMANCE STATUS: 1 - Symptomatic but completely ambulatory Vitals:   08/14/18 0901  BP: (!) 143/79  Pulse: 75  Resp: 18  Temp: (!) 97.4 F (36.3 C)  SpO2: 96%   Filed Weights   08/14/18 0901  Weight: (!) 301 lb 8 oz (136.8 kg)    Physical Exam  Constitutional: She is oriented to person, place, and time. No distress.  Obese, walks with a walker  HENT:  Head: Normocephalic and atraumatic.  Mouth/Throat: Oropharynx is clear and moist.  Eyes: Pupils are equal, round, and reactive to light. EOM are normal. No scleral icterus.  Neck: Normal range of motion. Neck supple.  Cardiovascular: Normal rate, regular rhythm and normal  heart sounds.  Pulmonary/Chest: Effort normal. No respiratory distress. She has no wheezes.  Abdominal: Soft. Bowel sounds are normal. She exhibits no distension and no mass. There is no tenderness.  Musculoskeletal: Normal range of motion. She exhibits edema. She exhibits no deformity.  Chronic lower extremity edema.  1+  Neurological: She is alert and oriented to person, place, and time. No cranial nerve deficit.  Coordination normal.  Skin: Skin is warm and dry. No rash noted. No erythema.  Psychiatric: She has a normal mood and affect. Her behavior is normal. Thought content normal.   LABORATORY DATA:  I have reviewed the data as listed Lab Results  Component Value Date   WBC 6.1 08/14/2018   HGB 7.6 (L) 08/14/2018   HCT 24.0 (L) 08/14/2018   MCV 99.2 08/14/2018   PLT 92 (L) 08/14/2018   Recent Labs    07/03/18 0850 07/24/18 0845 08/14/18 0839  NA 142 141 141  K 3.6 3.3* 3.3*  CL 104 100 102  CO2 '28 30 28  '$ GLUCOSE 100* 138* 97  BUN '11 12 12  '$ CREATININE 0.75 0.78 0.78  CALCIUM 9.2 9.2 8.8*  GFRNONAA >60 >60 >60  GFRAA >60 >60 >60  PROT 7.1 7.0 6.8  ALBUMIN 3.4* 3.6 3.3*  AST '23 28 24  '$ ALT '24 23 19  '$ ALKPHOS 76 74 66  BILITOT 0.2* 0.6 0.3   RADIOGRAPHIC STUDIES: I have personally reviewed the radiological images as listed and agreed with the findings in the report. NM Cardiac Muga rest: Calculated LEFT ventricular ejection fraction equals 45%   ASSESSMENT & PLAN:  1. Thrombocytopenia ([place])   2. 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 Hypokalemia   4. Anemia due to antineoplastic chemotherapy   5. Shortness of breath on exertion   Cancer Staging Malignant neoplasm of lower-outer quadrant of right female breast [facility]) Staging form: Breast, AJCC 8th Edition - Clinical stage from 05/08/2018: Stage IIB (cT2, cN0, cM0, G3, ER-, PR-, HER2-) - Signed by Earlie Server, MD on 05/08/2018  #Triple negative breast cancer, clinically responding to treatment.  Labs  reviewed and discussed with patient.  Will postpone chemotherapy for 1 week due to cytopenia.  #Anemia due to chemotherapy.  Hemoglobin dropped to 7.6.  Hold chemotherapy and pulse for 1 week. #Fatigue and shortness of breath with exertion most likely secondary to anemia.  Continue monitor.  Anticipate counts or improved after chemo break.  # Nausea and diarrhea after previous chemotherapy, she will receive IV fluid for hydration on Day 3.  Continue home antiemetics, and low dose Ativan as instructed.  #Thrombocytopenia, grade 1, due to chemotherapy.  Continue monitor. # Hypokalemia, potassium 3.3 today.  Continue K dur, advised patient to take 40 mEq daily.   We spent sufficient time to discuss many aspect of care, questions were answered to patient's satisfaction.  Return of visit: 1 week for reassessment prior to cycle 5 chemotherapy treatment.   Earlie Server, MD, PhD Hematology Oncology [facility] Pager- [phone] 08/14/2018

## 2018-08-14 NOTE — Progress Notes (Signed)
Patient here for follow up. Pt complains of increased shortness of breath within the last few days. Requesting refill for dexamethasone. 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 Having having slight vision change.

## 2018-08-16 ENCOUNTER — Inpatient Hospital Stay: Payer: BLUE CROSS/BLUE SHIELD

## 2018-08-20 ENCOUNTER — Other Ambulatory Visit: Payer: Self-pay | Admitting: Oncology

## 2018-08-20 DIAGNOSIS — C50919 Malignant neoplasm of unspecified site of unspecified female breast: Secondary | ICD-10-CM

## 2018-08-20 NOTE — Telephone Encounter (Signed)
...    Ref Range & Units 6 d ago  Potassium 3.5 - 5.1 mmol/L 3.3Low          

## 2018-08-21 ENCOUNTER — Inpatient Hospital Stay: Payer: BLUE CROSS/BLUE SHIELD

## 2018-08-21 ENCOUNTER — Other Ambulatory Visit: Payer: Self-pay

## 2018-08-21 ENCOUNTER — Inpatient Hospital Stay (HOSPITAL_BASED_OUTPATIENT_CLINIC_OR_DEPARTMENT_OTHER): Payer: BLUE CROSS/BLUE SHIELD | Admitting: Nurse Practitioner

## 2018-08-21 ENCOUNTER — Encounter: Payer: Self-pay | Admitting: Oncology

## 2018-08-21 VITALS — BP 148/65 | HR 75 | Temp 96.7°F | Resp 18 | Wt 297.6 lb

## 2018-08-21 DIAGNOSIS — E039 Hypothyroidism, unspecified: Secondary | ICD-10-CM

## 2018-08-21 DIAGNOSIS — D6481 Anemia due to antineoplastic chemotherapy: Secondary | ICD-10-CM | POA: Diagnosis not present

## 2018-08-21 DIAGNOSIS — C50511 Malignant neoplasm of lower-outer quadrant of right female breast: Secondary | ICD-10-CM

## 2018-08-21 DIAGNOSIS — C50919 Malignant neoplasm of unspecified site of unspecified female breast: Secondary | ICD-10-CM

## 2018-08-21 DIAGNOSIS — E876 Hypokalemia: Secondary | ICD-10-CM

## 2018-08-21 DIAGNOSIS — M818 Other osteoporosis without current pathological fracture: Secondary | ICD-10-CM

## 2018-08-21 DIAGNOSIS — Z171 Estrogen receptor negative status [ER-]: Secondary | ICD-10-CM

## 2018-08-21 DIAGNOSIS — D6959 Other secondary thrombocytopenia: Secondary | ICD-10-CM

## 2018-08-21 DIAGNOSIS — I4891 Unspecified atrial fibrillation: Secondary | ICD-10-CM

## 2018-08-21 DIAGNOSIS — I10 Essential (primary) hypertension: Secondary | ICD-10-CM

## 2018-08-21 DIAGNOSIS — I501 Left ventricular failure: Secondary | ICD-10-CM

## 2018-08-21 DIAGNOSIS — Z5111 Encounter for antineoplastic chemotherapy: Secondary | ICD-10-CM

## 2018-08-21 LAB — CBC WITH DIFFERENTIAL/PLATELET
Abs Immature Granulocytes: 0.12 10*3/uL — ABNORMAL HIGH (ref 0.00–0.07)
BASOS ABS: 0 10*3/uL (ref 0.0–0.1)
Basophils Relative: 0 %
EOS ABS: 0.1 10*3/uL (ref 0.0–0.5)
EOS PCT: 1 %
HCT: 28.4 % — ABNORMAL LOW (ref 36.0–46.0)
HEMOGLOBIN: 8.6 g/dL — AB (ref 12.0–15.0)
Immature Granulocytes: 1 %
LYMPHS PCT: 17 %
Lymphs Abs: 1.6 10*3/uL (ref 0.7–4.0)
MCH: 30.5 pg (ref 26.0–34.0)
MCHC: 30.3 g/dL (ref 30.0–36.0)
MCV: 100.7 fL — ABNORMAL HIGH (ref 80.0–100.0)
Monocytes Absolute: 0.6 10*3/uL (ref 0.1–1.0)
Monocytes Relative: 7 %
NRBC: 0.5 % — AB (ref 0.0–0.2)
Neutro Abs: 7 10*3/uL (ref 1.7–7.7)
Neutrophils Relative %: 74 %
Platelets: 451 10*3/uL — ABNORMAL HIGH (ref 150–400)
RBC: 2.82 MIL/uL — AB (ref 3.87–5.11)
RDW: 21.9 % — AB (ref 11.5–15.5)
WBC: 9.4 10*3/uL (ref 4.0–10.5)

## 2018-08-21 LAB — COMPREHENSIVE METABOLIC PANEL
ALK PHOS: 72 U/L (ref 38–126)
ALT: 18 U/L (ref 0–44)
AST: 23 U/L (ref 15–41)
Albumin: 3.5 g/dL (ref 3.5–5.0)
Anion gap: 12 (ref 5–15)
BILIRUBIN TOTAL: 0.5 mg/dL (ref 0.3–1.2)
BUN: 12 mg/dL (ref 8–23)
CALCIUM: 9.2 mg/dL (ref 8.9–10.3)
CHLORIDE: 101 mmol/L (ref 98–111)
CO2: 28 mmol/L (ref 22–32)
CREATININE: 0.73 mg/dL (ref 0.44–1.00)
Glucose, Bld: 104 mg/dL — ABNORMAL HIGH (ref 70–99)
Potassium: 3.4 mmol/L — ABNORMAL LOW (ref 3.5–5.1)
Sodium: 141 mmol/L (ref 135–145)
TOTAL PROTEIN: 7.1 g/dL (ref 6.5–8.1)

## 2018-08-21 MED ORDER — HEPARIN SOD (PORK) LOCK FLUSH 100 UNIT/ML IV SOLN
500.0000 [IU] | Freq: Once | INTRAVENOUS | Status: AC
  Administered 2018-08-21: 500 [IU] via INTRAVENOUS

## 2018-08-21 MED ORDER — SODIUM CHLORIDE 0.9 % IV SOLN
Freq: Once | INTRAVENOUS | Status: AC
  Administered 2018-08-21: 10:00:00 via INTRAVENOUS
  Filled 2018-08-21: qty 250

## 2018-08-21 MED ORDER — PALONOSETRON HCL INJECTION 0.25 MG/5ML
0.2500 mg | Freq: Once | INTRAVENOUS | Status: AC
  Administered 2018-08-21: 0.25 mg via INTRAVENOUS
  Filled 2018-08-21: qty 5

## 2018-08-21 MED ORDER — DEXAMETHASONE SODIUM PHOSPHATE 10 MG/ML IJ SOLN
10.0000 mg | Freq: Once | INTRAMUSCULAR | Status: AC
  Administered 2018-08-21: 10 mg via INTRAVENOUS
  Filled 2018-08-21: qty 1

## 2018-08-21 MED ORDER — SODIUM CHLORIDE 0.9 % IV SOLN
486.0000 mg | Freq: Once | INTRAVENOUS | Status: AC
  Administered 2018-08-21: 490 mg via INTRAVENOUS
  Filled 2018-08-21: qty 49

## 2018-08-21 MED ORDER — SODIUM CHLORIDE 0.9 % IV SOLN
75.0000 mg/m2 | Freq: Once | INTRAVENOUS | Status: AC
  Administered 2018-08-21: 140 mg via INTRAVENOUS
  Filled 2018-08-21: qty 14

## 2018-08-21 MED ORDER — HEPARIN SOD (PORK) LOCK FLUSH 100 UNIT/ML IV SOLN
INTRAVENOUS | Status: AC
  Filled 2018-08-21: qty 5

## 2018-08-21 NOTE — Progress Notes (Signed)
Patient here for follow up. No concerns voiced.  °

## 2018-08-21 NOTE — Progress Notes (Signed)
For [place] per MD: use ABW and AUC 4

## 2018-08-21 NOTE — Progress Notes (Signed)
Hematology/Oncology Follow up Note [facility] [phone] [phone]   Patient Care Team: Valerie Roys, DO as PCP - General (Family Medicine) Thornton Park, MD as Referring Physician (Orthopedic Surgery) Earlie Server, MD as Medical Oncologist (Medical Oncology)  REFERRING PROVIDER: Valerie Roys, DO   REASON FOR VISIT:  Follow up and assessment prior to chemotherapy for treatment of breast cancer   HISTORY OF PRESENTING ILLNESS:  Oncology History   Wendy Marsh, initially presented as 62 year old female with past medical history as below who was referred by PCP, for evaluation for breast cancer.  Patient initially presented with self palpated right breast mass for 1 month.  PCP ordered diagnostic mammogram (04/27/18) which showed: Highly suspicious spiculated mass in right breast at 6:00 o'clock axis, 9cm from the nipple, measuring 2.9 cm corresponding to area of concern.  Targeted ultrasound showed irregular, hypoechoic mass.  Right axilla showed no enlarged or morphologically abnormal lymph nodes. 05/04/2018-breast mass was biopsied.  Pathology: Invasive mammary carcinoma, grade 3, LVSI suspicious, favor present.  ER- PR- HER2-  She denied nipple discharge, breast skin changes. Denies history of radiation to the chest or previous breast surgery.  She initially presented with multiple family members including husband, 3 daughters, and son-in-law.  At baseline she uses a walker for ambulation.  Chronic right knee pain due to osteoarthritis.  History of paroxysmal A. fib, on metoprolol for rate control.  On aspirin 81 mg daily.  MUGA-baseline MUGA scan revealed systolic function depression, LVEF 45%.  Given possible cardiac toxicity of Adriamycin and potential for irreversible cardiomyopathy, non-anthracycline-containing regimen was discussed with patient.    Dr. Tasia Catchings recommended Docetaxel '75mg'$ /m2 and Carboplatin every 3 weeks for 4-6  cycles based on data from PROGECT study, Tammy Sours et al 2018 Clin Cancer Res, Pathological Response and Survival in Triple-Negative Breast Cancer Following Neoadjuvant Carboplatin plus Docetaxel. Stage I-III TNBC patients, Neoadjuvant carboplatin (AUC6) plus docetaxel (75 mg/m2) every 21 days  6 cycles. pCR was 55% and Residual cancer burden (RCB) were 13%. Excellent Three-year RFS was 90% in patients with pCR and 66% in those without pCR.  She initiated cycle 1 of neoadjuvant carboplatin-Taxotere on 05/22/2018.  She receives [place] for prophylaxis of febrile neutropenias related to chemotherapy.    Genetic Testing & Family History: Half-Sister & grandmother w/ history of breast cancer. Father deceased from esophageal cancer.  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 Tasia Catchings recommended genetic testing which was performed by Steele Berg, genetic counselor. [employer]'s 84 gene multi cancer panel was performed which did not reveal any pathogenic mutations. VUS detected: SUFU c.992G>A (p.Arg331GIn) which is still considered a normal result.  Based on findings, her cancer is most likely not due to an inherited predisposition.  #Patient evaluated by Dr. Nehemiah Massed based on findings of MUGA scan.  Repeated echocardiogram showed normal EF of 55%. Per his note, no chemotherapy restriction.  Patient bolused risk possible for cardiovascular complications.  Currently no evidence of active and/or significant angina and/or congestive heart failure.  She was recommended by cardiology to hold flecainide 2 to 3 days before and after chemo if Zofran will be used to reduce QT interval changes and interaction.     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[place])    05/09/2018 - 05/14/2018 Chemotherapy    The patient had DOXOrubicin (ADRIAMYCIN) chemo injection 146 mg,  60 mg/m2,  Intravenous,  Once, 0 of 4 cycles palonosetron (ALOXI) injection 0.25 mg, 0.25 mg, Intravenous,  Once, 0 of 8 cycles pegfilgrastim-cbqv (UDENYCA) injection 6 mg, 6 mg, Subcutaneous, Once, 0 of 4 cycles CARBOplatin (PARAPLATIN) in sodium chloride 0.9 % 100 mL chemo infusion, , Intravenous,  Once, 0 of 4 cycles cyclophosphamide (CYTOXAN) 1,460 mg in sodium chloride 0.9 % 250 mL chemo infusion, 600 mg/m2, Intravenous,  Once, 0 of 4 cycles PACLitaxel (TAXOL) 192 mg in sodium chloride 0.9 % 250 mL chemo infusion (</= '80mg'$ /m2), 80 mg/m2, Intravenous,  Once, 0 of 4 cycles fosaprepitant (EMEND) 150 mg, dexamethasone (DECADRON) 12 mg in sodium chloride 0.9 % 145 mL IVPB, , Intravenous,  Once, 0 of 8 cycles  for chemotherapy treatment.     05/20/2018 -  Chemotherapy    The patient had palonosetron (ALOXI) injection 0.25 mg, 0.25 mg, Intravenous,  Once, 5 of 6 cycles Administration: 0.25 mg (05/22/2018), 0.25 mg (06/12/2018), 0.25 mg (07/03/2018), 0.25 mg (07/24/2018), 0.25 mg (08/21/2018) pegfilgrastim-cbqv (UDENYCA) injection 6 mg, 6 mg, Subcutaneous, Once, 5 of 6 cycles Administration: 6 mg (05/24/2018), 6 mg (06/15/2018), 6 mg (07/05/2018), 6 mg (07/26/2018), 6 mg (08/23/2018) CARBOplatin (PARAPLATIN) 750 mg in sodium chloride 0.9 % 250 mL chemo infusion, 750 mg (100 % of original dose 750 mg), Intravenous,  Once, 5 of 6 cycles Dose modification:   (original dose 750 mg, Cycle 1),   (original dose 900 mg, Cycle 2) Administration: 750 mg (05/22/2018), 900 mg (06/12/2018), 900 mg (07/03/2018), 900 mg (07/24/2018), 490 mg (08/21/2018) DOCEtaxel (TAXOTERE) 140 mg in sodium chloride 0.9 % 250 mL chemo infusion, 75 mg/m2 = 140 mg, Intravenous,  Once, 5 of 6 cycles Administration: 140 mg (05/22/2018), 140 mg (06/12/2018), 140 mg (07/03/2018), 140 mg (07/24/2018), 140 mg (08/21/2018)  for chemotherapy treatment.      Malignant neoplasm of lower-outer quadrant of right female breast ([place])   04/27/2018 Mammogram     DIAGNOSTIC MAMMOGRAM BILATERAL & US BREAST RIGHT 1. Highly suspicious mass within the RIGHT breast at the 6 o'clock axis, 9 cm from the nipple, measuring 2.9 cm, corresponding to the area of clinical concern. Ultrasound-guided biopsy is recommended. 2. No evidence of malignancy within the LEFT breast.  Targeted ultrasound is performed, showing an irregular hypoechoic mass in the RIGHT breast at the 6 o'clock axis, 9 cm from the nipple, with internal vascularity, measuring 2.9 cm greatest dimension, corresponding to the palpable lump. Ultrasound of the RIGHT axilla shows no enlarged or morphologically abnormal lymph nodes.    05/02/2018 Initial Biopsy    A. RIGHT BREAST, LOWER OUTER QUADRANT; ULTRASOUND-GUIDED CORE BIOPSY:  - INVASIVE MAMMARY CARCINOMA.   Size of invasive carcinoma: 15 mm in this sample  Histologic grade of invasive carcinoma: Grade 3            Glandular/tubular differentiation score: 3            Nuclear pleomorphism score: 3            Mitotic rate score: 3            Total score: 9  Ductal carcinoma in situ: Not identified  Lymphovascular invasion: Suspicious, favor present      05/08/2018 Initial Diagnosis    Malignant neoplasm of lower-outer quadrant of right female breast ([place])    05/08/2018 Cancer Staging    Staging form: Breast, AJCC 8th Edition - Clinical stage from 05/08/2018: Stage IIB (cT2, cN0, cM0, G3, ER-, PR-, HER2-) - Signed by Tasia Catchings,  Talbert Cage, MD on 05/08/2018    05/08/2018 Tumor Marker    05/08/18- CA 27.29 25.7  CA 15-3 11.2    05/14/2018 Echocardiogram    MUGA- calculated LVEF 45%- She was referred to cardiology.      05/22/2018 -  Neo-Adjuvant Chemotherapy    Initial plan for DD-AC followed by Taxol and Carboplatin, however, given cardiotoxicity concerns of Adriamycin and findings on MUGA, Docetaxel and Carboplatin every 3 weeks for 4-6 cycles (based on data from PROGECT study).   She would receive Udenyca for  prophylaxis for chemotherapy induced neutropenia and to prevent febrile neutropenias.     05/22/2018 Genetic Testing    Genetic Testing- she has a family history of breast cancer and a personal history of triple negative breast cancer.   On 05/08/18, she underwent [employer]'s 84 gene multi-cancer panel which did not reveal pathogenic mutation. BRCA negative. VUS was detected: SUFU c.992G>A (p.Arg331Gln)      INTERVAL HISTORY ZANARIA MORELL is a 62 y.o. female, with above history of breast cancer who returns to clinic today for consideration of cycle 5 of neoadjuvant chemotherapy with carboplatin and docetaxel.   She was seen by Dr. Tasia Catchings in clinic on 08/14/2018 for consideration of cycle 5 of chemotherapy.  At that time, hemoglobin was 7.6, platelets 92, and treatment was held.  She was complaining of fatigue and shortness of breath at that time which today, she says is chronic but has improved since last week.  She previously suffered nausea and diarrhea after prior cycles and required IV fluid hydration.  Today, she denies any nausea, vomiting, or diarrhea.  Appetite has been stable.  She denies mouth sores.  Has had weight loss which she accounts to altered taste sensations d/t metal dental work/bridge which she has removed.  She states that overall today she feels well and is eager to proceed with chemotherapy.   Review of Systems  Constitutional: Positive for malaise/fatigue (improved) and weight loss. Negative for chills and fever.  HENT: Negative for nosebleeds and sore throat.        Abnormal taste sensation; she removed metal dental bridge which has improved her symptoms  Eyes: Negative for double vision, photophobia and redness.  Respiratory: Positive for shortness of breath (improved). Negative for cough and wheezing.   Cardiovascular: Negative for chest pain, palpitations and orthopnea.  Gastrointestinal: Negative for abdominal pain, blood in stool, nausea and vomiting.    Genitourinary: Negative for dysuria.  Musculoskeletal: Negative for back pain, joint pain, myalgias and neck pain.       Chronic right knee pain.  Skin: Negative for itching and rash.  Neurological: Negative for dizziness, tingling and tremors.  Endo/Heme/Allergies: Negative for environmental allergies. Does not bruise/bleed easily.  Psychiatric/Behavioral: Negative for depression. The patient is not nervous/anxious and does not have insomnia.     MEDICAL HISTORY:  Past Medical History:  Diagnosis Date  . Arthritis   . Atrial fibrillation ([place])   . Dysrhythmia    afib  . Hyperlipidemia   . Hypertension   . Hypothyroidism   . Lumbago   . Osteoporosis   . Thyroid disease     SURGICAL HISTORY: Past Surgical History:  Procedure Laterality Date  . BREAST BIOPSY  05/02/2018  . JOINT REPLACEMENT Left    tkr  . PORTACATH PLACEMENT Right 05/11/2018   Procedure: INSERTION  I.J PORT-A-CATH;  Surgeon: Robert Bellow, MD;  Location: [facility] ORS;  Service: General;  Laterality: Right;  . REPLACEMENT TOTAL KNEE  SOCIAL HISTORY: Social History   Socioeconomic History  . Marital status: Married    Spouse name: jerry  . Number of children: 3  . Years of education: Not on file  . Highest education level: Some college, no degree  Occupational History  . Not on file  Social Needs  . Financial resource strain: Somewhat hard  . Food insecurity:    Worry: Never true    Inability: Never true  . Transportation needs:    Medical: No    Non-medical: No  Tobacco Use  . Smoking status: Never Smoker  . Smokeless tobacco: Never Used  Substance and Sexual Activity  . Alcohol use: No  . Drug use: No  . Sexual activity: Yes    Birth control/protection: Post-menopausal  Lifestyle  . Physical activity:    Days per week: 0 days    Minutes per session: 0 min  . Stress: Only a little  Relationships  . Social connections:    Talks on phone: More than three times a week    Gets  together: Twice a week    Attends religious service: Never    Active member of club or organization: No    Attends meetings of clubs or organizations: Not on file    Relationship status: Married  . Intimate partner violence:    Fear of current or ex partner: No    Emotionally abused: No    Physically abused: No    Forced sexual activity: No  Other Topics Concern  . Not on file  Social History Narrative  . Not on file    FAMILY HISTORY: Family History  Problem Relation Age of Onset  . Diabetes Mother   . Congestive Heart Failure Mother   . Breast cancer Maternal Grandmother        dx 80s; deceased 71s  . Cancer Maternal Uncle 68       deceased 44s  . Other Father        No information on father or paternal relatives  . Breast cancer Other 3       paternal half-sister; also esophageal ca at 31; currently 37  . Diabetes Other   . Hypertension Other   . Kidney disease Other   . Thyroid disease Other     ALLERGIES:  is allergic to morphine and related and peanut-containing drug products.  MEDICATIONS:  Current Outpatient Medications  Medication Sig Dispense Refill  . aspirin 81 MG tablet Take 81 mg by mouth daily.    . chlorhexidine (PERIDEX) 0.12 % solution Use as directed 15 mLs in the mouth or throat 2 (two) times daily. 473 mL 0  . dexamethasone (DECADRON) 4 MG tablet Take 2 tablets (8 mg total) by mouth 2 (two) times daily. Then again the day after chemo for 3 days. 25 tablet 0  . EPINEPHrine 0.3 mg/0.3 mL IJ SOAJ injection   12  . flecainide (TAMBOCOR) 50 MG tablet Take 50 mg by mouth 2 (two) times daily.     Marland Kitchen levothyroxine (SYNTHROID, LEVOTHROID) 175 MCG tablet Take 1 tablet (175 mcg total) by mouth daily before breakfast. 90 tablet 0  . lidocaine-prilocaine (EMLA) cream Apply to affected area once 30 g 3  . lisinopril-hydrochlorothiazide (PRINZIDE,ZESTORETIC) 20-25 MG tablet Take 2 tablets by mouth daily. 180 tablet 3  . LORazepam (ATIVAN) 0.5 MG tablet Take 1  tablet (0.5 mg total) by mouth every 12 (twelve) hours as needed for anxiety (NAUSEA). 30 tablet 0  . metoprolol succinate (TOPROL-XL)  50 MG 24 hr tablet TAKE 1 TABLET BY MOUTH ONCE DAILY WITH  OR  IMMEDIATELY  FOLLOWING  A  MEAL 90 tablet 1  . Nutritional Supplements (JUICE PLUS FIBRE PO) Take 2 capsules by mouth daily. Fruit blend    . ondansetron (ZOFRAN) 8 MG tablet Take 1 tablet (8 mg total) by mouth 2 (two) times daily as needed for refractory nausea / vomiting. Start on day 3 after chemo. 30 tablet 1  . potassium chloride SA (K-DUR,KLOR-CON) 20 MEQ tablet Take 1 tablet (20 mEq total) by mouth daily. 30 tablet 0  . prochlorperazine (COMPAZINE) 10 MG tablet Take 1 tablet (10 mg total) by mouth every 6 (six) hours as needed (Nausea or vomiting). 30 tablet 1   No current facility-administered medications for this visit.     PHYSICAL EXAMINATION: ECOG PERFORMANCE STATUS: 1 - Symptomatic but completely ambulatory Vitals:   08/21/18 0853  BP: (!) 148/65  Pulse: 75  Resp: 18  Temp: (!) 96.7 F (35.9 C)   Filed Weights   08/21/18 0853  Weight: 297 lb 9.6 oz (135 kg)    Physical Exam  Constitutional: She is oriented to person, place, and time. No distress.  Obese, 4 wheel walker; accompanied  HENT:  Head: Normocephalic and atraumatic.  Mouth/Throat: Oropharynx is clear and moist. No oropharyngeal exudate.  Missing teeth  Eyes: Pupils are equal, round, and reactive to light. EOM are normal. No scleral icterus.  Neck: Normal range of motion. Neck supple.  Cardiovascular: Normal rate, regular rhythm and normal heart sounds.  Pulmonary/Chest: Effort normal. No respiratory distress. She has no wheezes.  Abdominal: Soft. Bowel sounds are normal. She exhibits no distension and no mass. There is no tenderness.  Musculoskeletal: Normal range of motion. She exhibits edema. She exhibits no deformity.  Chronic lower extremity edema.  1+  Neurological: She is alert and oriented to person,  place, and time. No cranial nerve deficit. Coordination normal.  Skin: Skin is warm and dry. No rash noted. No erythema.  Psychiatric: She has a normal mood and affect. Her behavior is normal. Thought content normal.   LABORATORY DATA:  I have reviewed the data as listed Lab Results  Component Value Date   WBC 9.4 08/21/2018   HGB 8.6 (L) 08/21/2018   HCT 28.4 (L) 08/21/2018   MCV 100.7 (H) 08/21/2018   PLT 451 (H) 08/21/2018   Recent Labs    07/24/18 0845 08/14/18 0839 08/21/18 0835  NA 141 141 141  K 3.3* 3.3* 3.4*  CL 100 102 101  CO2 '30 28 28  '$ GLUCOSE 138* 97 104*  BUN '12 12 12  '$ CREATININE 0.78 0.78 0.73  CALCIUM 9.2 8.8* 9.2  GFRNONAA >60 >60 >60  GFRAA >60 >60 >60  PROT 7.0 6.8 7.1  ALBUMIN 3.6 3.3* 3.5  AST '28 24 23  '$ ALT '23 19 18  '$ ALKPHOS 74 66 72  BILITOT 0.6 0.3 0.5   RADIOGRAPHIC STUDIES: I have personally reviewed the radiological images as listed and agreed with the findings in the report. NM Cardiac Muga rest: Calculated LEFT ventricular ejection fraction equals 45%   ASSESSMENT & PLAN:  No diagnosis found.Cancer Staging Malignant neoplasm of lower-outer quadrant of right female breast [facility]) Staging form: Breast, AJCC 8th Edition - Clinical stage from 05/08/2018: Stage IIB (cT2, cN0, cM0, G3, ER-, PR-, HER2-) - Signed by Earlie Server, MD on 05/08/2018  1.  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  Axillary ultrasound did not show enlarged or morphologically abnormal lymph nodes.  Genetic testing was negative.  Currently s/p cycle 4 of neoadjuvant Carboplatin-Docetaxel. Cycle 5 previously held d/t anemia (see below).  Otherwise tolerating chemotherapy well. Counts today acceptable to proceed with cycle 5 of neoadjuvant Carboplatin-Docetaxel but will plan on dose reduction of carbo to AUC 4.  She will require Udenyca on day 3 for prevention of febrile neutropenias.   2.   Chemotherapy-induced anemia- hmg previously 7.6, improved to 8.6 today. Symptoms- fatigue & sob w/ exertion-  improved. Would consider transfusion if hemoglobin < 8 and symptomatic.  Recheck in 1 week to monitor.   3.  Chemotherapy-induced thrombocytopenia- plt 92 previously. Today 451.  Continue to monitor  4.  Hypokalemia-K 3.3 previously. K 3.4 today. Continue K-Dur 40 meq daily and encouraged intake of potassium rich foods as tolerated.  Continue to monitor.  5.  Chemotherapy-induced nausea- well managed with oral medications. Continue antiemetics as prescribed. Will plan for IV fluids on day 3.   Return of visit: rtc on 11/21 for IV fluids and Udenyca. rtc in 1 week for lab (cbc, cmp, hold tube) and possible transfusion. rtc in 3 weeks for labs and consideration of cycle 6 of carbo-docetaxol with Udenyca 2 days later.   We spent sufficient time to discuss many aspect of care, questions were answered to patient's satisfaction.  History, physical exam, and assessment discussed with Dr. Tasia Catchings, who, as supervising physician, contributed to and agreed with plan of care.   Beckey Rutter, DNP, AGNP-C [place] [facility] [phone] (work cell) [phone] (office)  CC: Dr. Tasia Catchings

## 2018-08-22 ENCOUNTER — Other Ambulatory Visit: Payer: Self-pay | Admitting: Oncology

## 2018-08-22 NOTE — Telephone Encounter (Signed)
...     Ref Range & Units 1d ago  Potassium 3.5 - 5.1 mmol/L 3.4Low

## 2018-08-23 ENCOUNTER — Inpatient Hospital Stay: Payer: BLUE CROSS/BLUE SHIELD

## 2018-08-23 VITALS — BP 136/80 | HR 62 | Temp 97.6°F | Resp 18

## 2018-08-23 DIAGNOSIS — C50919 Malignant neoplasm of unspecified site of unspecified female breast: Secondary | ICD-10-CM

## 2018-08-23 DIAGNOSIS — C50511 Malignant neoplasm of lower-outer quadrant of right female breast: Secondary | ICD-10-CM | POA: Diagnosis not present

## 2018-08-23 MED ORDER — SODIUM CHLORIDE 0.9% FLUSH
10.0000 mL | INTRAVENOUS | Status: DC | PRN
  Administered 2018-08-23: 10 mL via INTRAVENOUS
  Filled 2018-08-23: qty 10

## 2018-08-23 MED ORDER — HEPARIN SOD (PORK) LOCK FLUSH 100 UNIT/ML IV SOLN
INTRAVENOUS | Status: AC
  Filled 2018-08-23: qty 5

## 2018-08-23 MED ORDER — PEGFILGRASTIM-CBQV 6 MG/0.6ML ~~LOC~~ SOSY
6.0000 mg | PREFILLED_SYRINGE | Freq: Once | SUBCUTANEOUS | Status: AC
  Administered 2018-08-23: 6 mg via SUBCUTANEOUS
  Filled 2018-08-23: qty 0.6

## 2018-08-23 MED ORDER — SODIUM CHLORIDE 0.9 % IV SOLN
Freq: Once | INTRAVENOUS | Status: AC
  Administered 2018-08-23: 10:00:00 via INTRAVENOUS
  Filled 2018-08-23: qty 250

## 2018-08-23 MED ORDER — HEPARIN SOD (PORK) LOCK FLUSH 100 UNIT/ML IV SOLN
500.0000 [IU] | Freq: Once | INTRAVENOUS | Status: AC
  Administered 2018-08-23: 500 [IU] via INTRAVENOUS

## 2018-08-28 ENCOUNTER — Inpatient Hospital Stay: Payer: BLUE CROSS/BLUE SHIELD

## 2018-08-28 DIAGNOSIS — C50919 Malignant neoplasm of unspecified site of unspecified female breast: Secondary | ICD-10-CM

## 2018-08-28 DIAGNOSIS — E876 Hypokalemia: Secondary | ICD-10-CM

## 2018-08-28 DIAGNOSIS — C50511 Malignant neoplasm of lower-outer quadrant of right female breast: Secondary | ICD-10-CM

## 2018-08-28 DIAGNOSIS — I501 Left ventricular failure: Secondary | ICD-10-CM

## 2018-08-28 DIAGNOSIS — Z5111 Encounter for antineoplastic chemotherapy: Secondary | ICD-10-CM

## 2018-08-28 LAB — COMPREHENSIVE METABOLIC PANEL
ALK PHOS: 90 U/L (ref 38–126)
ALT: 22 U/L (ref 0–44)
ANION GAP: 9 (ref 5–15)
AST: 27 U/L (ref 15–41)
Albumin: 3.7 g/dL (ref 3.5–5.0)
BUN: 14 mg/dL (ref 8–23)
CALCIUM: 9 mg/dL (ref 8.9–10.3)
CO2: 30 mmol/L (ref 22–32)
Chloride: 99 mmol/L (ref 98–111)
Creatinine, Ser: 0.84 mg/dL (ref 0.44–1.00)
GFR calc Af Amer: >60 mL/min (ref >60)
GFR calc non Af Amer: >60 mL/min (ref >60)
GLUCOSE: 93 mg/dL (ref 70–99)
Potassium: 3.6 mmol/L (ref 3.5–5.1)
SODIUM: 138 mmol/L (ref 135–145)
Total Bilirubin: 0.7 mg/dL (ref 0.3–1.2)
Total Protein: 6.9 g/dL (ref 6.5–8.1)

## 2018-08-28 LAB — CBC WITH DIFFERENTIAL/PLATELET
ABS IMMATURE GRANULOCYTES: 1.33 10*3/uL — AB (ref 0.00–0.07)
BASOS ABS: 0 10*3/uL (ref 0.0–0.1)
BASOS PCT: 0 %
Eosinophils Absolute: 0 10*3/uL (ref 0.0–0.5)
Eosinophils Relative: 0 %
HCT: 30 % — ABNORMAL LOW (ref 36.0–46.0)
Hemoglobin: 9.4 g/dL — ABNORMAL LOW (ref 12.0–15.0)
IMMATURE GRANULOCYTES: 8 %
LYMPHS ABS: 3.5 10*3/uL (ref 0.7–4.0)
Lymphocytes Relative: 21 %
MCH: 31.2 pg (ref 26.0–34.0)
MCHC: 31.3 g/dL (ref 30.0–36.0)
MCV: 99.7 fL (ref 80.0–100.0)
Monocytes Absolute: 2.4 10*3/uL — ABNORMAL HIGH (ref 0.1–1.0)
Monocytes Relative: 15 %
NEUTROS PCT: 56 %
Neutro Abs: 9.6 10*3/uL — ABNORMAL HIGH (ref 1.7–7.7)
PLATELETS: 392 10*3/uL (ref 150–400)
RBC: 3.01 MIL/uL — AB (ref 3.87–5.11)
RDW: 19.9 % — ABNORMAL HIGH (ref 11.5–15.5)
WBC: 16.8 10*3/uL — ABNORMAL HIGH (ref 4.0–10.5)
nRBC: 2.3 % — ABNORMAL HIGH (ref 0.0–0.2)

## 2018-09-05 ENCOUNTER — Ambulatory Visit: Payer: BLUE CROSS/BLUE SHIELD | Admitting: Family Medicine

## 2018-09-06 ENCOUNTER — Ambulatory Visit: Payer: BLUE CROSS/BLUE SHIELD

## 2018-09-11 ENCOUNTER — Encounter: Payer: Self-pay | Admitting: Oncology

## 2018-09-11 ENCOUNTER — Inpatient Hospital Stay (HOSPITAL_BASED_OUTPATIENT_CLINIC_OR_DEPARTMENT_OTHER): Payer: BLUE CROSS/BLUE SHIELD | Admitting: Oncology

## 2018-09-11 ENCOUNTER — Inpatient Hospital Stay: Payer: BLUE CROSS/BLUE SHIELD | Attending: Oncology

## 2018-09-11 ENCOUNTER — Other Ambulatory Visit: Payer: Self-pay

## 2018-09-11 ENCOUNTER — Other Ambulatory Visit: Payer: Self-pay | Admitting: Oncology

## 2018-09-11 ENCOUNTER — Inpatient Hospital Stay: Payer: BLUE CROSS/BLUE SHIELD

## 2018-09-11 VITALS — BP 153/80 | HR 79 | Temp 96.8°F | Resp 18 | Wt 303.2 lb

## 2018-09-11 DIAGNOSIS — C50511 Malignant neoplasm of lower-outer quadrant of right female breast: Secondary | ICD-10-CM | POA: Diagnosis present

## 2018-09-11 DIAGNOSIS — E876 Hypokalemia: Secondary | ICD-10-CM | POA: Diagnosis not present

## 2018-09-11 DIAGNOSIS — I501 Left ventricular failure: Secondary | ICD-10-CM

## 2018-09-11 DIAGNOSIS — Z5111 Encounter for antineoplastic chemotherapy: Secondary | ICD-10-CM | POA: Diagnosis not present

## 2018-09-11 DIAGNOSIS — D649 Anemia, unspecified: Secondary | ICD-10-CM

## 2018-09-11 DIAGNOSIS — R600 Localized edema: Secondary | ICD-10-CM | POA: Diagnosis not present

## 2018-09-11 DIAGNOSIS — I10 Essential (primary) hypertension: Secondary | ICD-10-CM | POA: Diagnosis not present

## 2018-09-11 DIAGNOSIS — I4891 Unspecified atrial fibrillation: Secondary | ICD-10-CM | POA: Diagnosis not present

## 2018-09-11 DIAGNOSIS — Z171 Estrogen receptor negative status [ER-]: Secondary | ICD-10-CM

## 2018-09-11 DIAGNOSIS — R110 Nausea: Secondary | ICD-10-CM | POA: Diagnosis not present

## 2018-09-11 DIAGNOSIS — E039 Hypothyroidism, unspecified: Secondary | ICD-10-CM | POA: Insufficient documentation

## 2018-09-11 DIAGNOSIS — C50919 Malignant neoplasm of unspecified site of unspecified female breast: Secondary | ICD-10-CM

## 2018-09-11 DIAGNOSIS — D6481 Anemia due to antineoplastic chemotherapy: Secondary | ICD-10-CM | POA: Diagnosis not present

## 2018-09-11 DIAGNOSIS — D696 Thrombocytopenia, unspecified: Secondary | ICD-10-CM

## 2018-09-11 DIAGNOSIS — Z803 Family history of malignant neoplasm of breast: Secondary | ICD-10-CM

## 2018-09-11 DIAGNOSIS — Z79899 Other long term (current) drug therapy: Secondary | ICD-10-CM | POA: Insufficient documentation

## 2018-09-11 DIAGNOSIS — M818 Other osteoporosis without current pathological fracture: Secondary | ICD-10-CM

## 2018-09-11 LAB — PREPARE RBC (CROSSMATCH)

## 2018-09-11 LAB — CBC WITH DIFFERENTIAL/PLATELET
ABS IMMATURE GRANULOCYTES: 0.05 10*3/uL (ref 0.00–0.07)
BASOS PCT: 0 %
Basophils Absolute: 0 10*3/uL (ref 0.0–0.1)
Eosinophils Absolute: 0 10*3/uL (ref 0.0–0.5)
Eosinophils Relative: 0 %
HCT: 25.8 % — ABNORMAL LOW (ref 36.0–46.0)
Hemoglobin: 7.9 g/dL — ABNORMAL LOW (ref 12.0–15.0)
IMMATURE GRANULOCYTES: 1 %
Lymphocytes Relative: 12 %
Lymphs Abs: 1 10*3/uL (ref 0.7–4.0)
MCH: 32.1 pg (ref 26.0–34.0)
MCHC: 30.6 g/dL (ref 30.0–36.0)
MCV: 104.9 fL — AB (ref 80.0–100.0)
MONOS PCT: 4 %
Monocytes Absolute: 0.4 10*3/uL (ref 0.1–1.0)
NEUTROS ABS: 7.2 10*3/uL (ref 1.7–7.7)
NEUTROS PCT: 83 %
PLATELETS: 143 10*3/uL — AB (ref 150–400)
RBC: 2.46 MIL/uL — ABNORMAL LOW (ref 3.87–5.11)
RDW: 19.7 % — ABNORMAL HIGH (ref 11.5–15.5)
WBC: 8.7 10*3/uL (ref 4.0–10.5)
nRBC: 0 % (ref 0.0–0.2)

## 2018-09-11 LAB — COMPREHENSIVE METABOLIC PANEL
ALT: 20 U/L (ref 0–44)
AST: 20 U/L (ref 15–41)
Albumin: 3.5 g/dL (ref 3.5–5.0)
Alkaline Phosphatase: 74 U/L (ref 38–126)
Anion gap: 10 (ref 5–15)
BUN: 17 mg/dL (ref 8–23)
CHLORIDE: 103 mmol/L (ref 98–111)
CO2: 27 mmol/L (ref 22–32)
CREATININE: 0.72 mg/dL (ref 0.44–1.00)
Calcium: 9 mg/dL (ref 8.9–10.3)
GFR calc Af Amer: >60 mL/min (ref >60)
GLUCOSE: 104 mg/dL — AB (ref 70–99)
Potassium: 3.3 mmol/L — ABNORMAL LOW (ref 3.5–5.1)
Sodium: 140 mmol/L (ref 135–145)
Total Bilirubin: 0.6 mg/dL (ref 0.3–1.2)
Total Protein: 6.8 g/dL (ref 6.5–8.1)

## 2018-09-11 MED ORDER — SODIUM CHLORIDE 0.9 % IV SOLN
Freq: Once | INTRAVENOUS | Status: AC
  Administered 2018-09-11: 10:00:00 via INTRAVENOUS
  Filled 2018-09-11: qty 250

## 2018-09-11 MED ORDER — HEPARIN SOD (PORK) LOCK FLUSH 100 UNIT/ML IV SOLN
500.0000 [IU] | Freq: Once | INTRAVENOUS | Status: DC | PRN
  Filled 2018-09-11: qty 5

## 2018-09-11 MED ORDER — DEXAMETHASONE SODIUM PHOSPHATE 10 MG/ML IJ SOLN
10.0000 mg | Freq: Once | INTRAMUSCULAR | Status: AC
  Administered 2018-09-11: 10 mg via INTRAVENOUS
  Filled 2018-09-11: qty 1

## 2018-09-11 MED ORDER — SODIUM CHLORIDE 0.9 % IV SOLN
75.0000 mg/m2 | Freq: Once | INTRAVENOUS | Status: AC
  Administered 2018-09-11: 140 mg via INTRAVENOUS
  Filled 2018-09-11: qty 14

## 2018-09-11 MED ORDER — HEPARIN SOD (PORK) LOCK FLUSH 100 UNIT/ML IV SOLN
500.0000 [IU] | Freq: Once | INTRAVENOUS | Status: AC
  Administered 2018-09-11: 500 [IU] via INTRAVENOUS

## 2018-09-11 MED ORDER — POTASSIUM CHLORIDE CRYS ER 20 MEQ PO TBCR: 20.0000 meq | EXTENDED_RELEASE_TABLET | Freq: Every day | ORAL | 0 refills | Status: DC

## 2018-09-11 MED ORDER — SODIUM CHLORIDE 0.9% FLUSH
10.0000 mL | INTRAVENOUS | Status: DC | PRN
  Administered 2018-09-11: 10 mL via INTRAVENOUS
  Filled 2018-09-11: qty 10

## 2018-09-11 MED ORDER — PALONOSETRON HCL INJECTION 0.25 MG/5ML
0.2500 mg | Freq: Once | INTRAVENOUS | Status: AC
  Administered 2018-09-11: 0.25 mg via INTRAVENOUS
  Filled 2018-09-11: qty 5

## 2018-09-11 MED ORDER — SODIUM CHLORIDE 0.9 % IV SOLN
486.0000 mg | Freq: Once | INTRAVENOUS | Status: AC
  Administered 2018-09-11: 490 mg via INTRAVENOUS
  Filled 2018-09-11: qty 49

## 2018-09-11 NOTE — Progress Notes (Signed)
Pt here for follow up. No concerns voiced.  

## 2018-09-11 NOTE — Progress Notes (Signed)
Hematology/Oncology Follow up note [facility] Telephone:([phone]) [phone] [phone]   Patient Care Team: Valerie Roys, DO as PCP - General (Family Medicine) Thornton Park, MD as Referring Physician (Orthopedic Surgery) Earlie Server, MD as Medical Oncologist (Medical Oncology)  REASON FOR VISIT Follow up for treatment of Breast cancer, assess of chemotherapy toxicity.  HISTORY OF PRESENTING ILLNESS:  Wendy Marsh is a  62 y.o.  female with PMH listed below who was referred to me for evaluation of breast cancer Patient report feeling right breast mass for about 1 month. She mentioned to primary care provider and had diagnostic mammogram Mammogram showed right breast 6 o'clock axis breast mass, 2.9 cm corresponding to area of concern.  Target [place] was performed showed irregular hypoechoic mass in right breast Right axilla shows no enlarged or morphologically abnormal lymph nodes.  Breast mass was biopsied, and pathology showed invasive mammary carcinoma.  Grade 3 , ER/PR- HER2 -.  LVI suspicious, favor present.   Nipple discharge: denies.  Breast Skin changes, denies Family history: sister has history of breast cancer, father deceased from esophageal cancer. ? M grandmother breast cancer.   History of radiation to chest: denies.  Previous breast surgery:   Patient presents with multiple family members including husband, three daughters, and son in Sports coach.  She walks with a walker. Chronic right knee pain due to osteoarthritis. Paroxymal A fib, on metoprolol for rate control.  Asprin '81mg'$  daily  #  baseline MUGA testing done and reviewed systolic function depression, LVEF 45%. Given that Adriamycin has cardiac toxicity, potentially can lead to irreversible cardiomyopathy, I discussed with patient about using non-anthracycline-containing regimen. Would recommend Docetaxel '75mg'$ /m2 and Carboplatin every 3 weeks for 4-6 cycles. [ data from Pringle et al 2018 Clin Cancer Res, Pathological Response and Survival in Triple-Negative Breast Cancer Following Neoadjuvant Carboplatin plus Docetaxel. Stage I-III TNBC patients, Neoadjuvant carboplatin (AUC6) plus docetaxel (75 mg/m2) every 21 days  6 cycles. pCR was 55% and Residual cancer burden (RCB) were 13%. Excellent Three-year RFS was 90% in patients with pCR and 66% in those without pCR]   # # Family history of breast cancer plus personal history of triple negative breast cancer. Testing did not reveal a pathogenic mutation in any of the genes analyzed.  BRCA negative.  VUS was detected. Does not change management.   # During the interval patient was also symmetrical ask for evaluation of mildly decreased systolic function.  Reviewed Dr. Heywood Footman note, no chemotherapy restriction.  At the lowest risk possible for cardiovascular complications.  Echocardiogram was repeated by Dr. Jose Persia showed normal EF of 55%.  Currently there is no evidence of active and or significant angina and or congestive heart failure. She was also recommended by cardiology to hold flecainide 2 to 3 days before and after chemo if Zofran will be used to reduce QT interval changes and interaction.  INTERVAL HISTORY Wendy Marsh is a 62 y.o. female who has above history reviewed by me today presents for assessment prior to  Cycle 6 Neoadjuvant chemotherapy with carboplatin and Docetaxel.   # Fatigue: reports worsening fatigue. Chronic onset, perisistent, worsened since last cycle of chemotherapy.  Appetite is fair.  # SOB worsened with moderate exertion.  # Chemotherapy tolerates well, managable nausea. No vomiting.  # chronic lower extremity swelling, slightly worsened.   Review of Systems  Constitutional: Positive for malaise/fatigue. Negative for chills, fever and weight loss.  HENT: Negative for nosebleeds and sore  throat.   Eyes: Negative for double vision, photophobia and redness.   Respiratory: Positive for shortness of breath. Negative for cough and wheezing.   Cardiovascular: Negative for chest pain, palpitations and orthopnea.  Gastrointestinal: Negative for abdominal pain, blood in stool, nausea and vomiting.  Genitourinary: Negative for dysuria.  Musculoskeletal: Positive for joint pain. Negative for back pain, myalgias and neck pain.       Chronic right knee pain.  Skin: Negative for itching and rash.  Neurological: Negative for dizziness, tingling and tremors.  Endo/Heme/Allergies: Negative for environmental allergies. Does not bruise/bleed easily.  Psychiatric/Behavioral: Negative for depression.    MEDICAL HISTORY:  Past Medical History:  Diagnosis Date  . Arthritis   . Atrial fibrillation ([place])   . Dysrhythmia    afib  . Hyperlipidemia   . Hypertension   . Hypothyroidism   . Lumbago   . Osteoporosis   . Thyroid disease     SURGICAL HISTORY: Past Surgical History:  Procedure Laterality Date  . BREAST BIOPSY  05/02/2018  . JOINT REPLACEMENT Left    tkr  . PORTACATH PLACEMENT Right 05/11/2018   Procedure: INSERTION  I.J PORT-A-CATH;  Surgeon: Robert Bellow, MD;  Location: [facility] ORS;  Service: General;  Laterality: Right;  . REPLACEMENT TOTAL KNEE      SOCIAL HISTORY: Social History   Socioeconomic History  . Marital status: Married    Spouse name: jerry  . Number of children: 3  . Years of education: Not on file  . Highest education level: Some college, no degree  Occupational History  . Not on file  Social Needs  . Financial resource strain: Somewhat hard  . Food insecurity:    Worry: Never true    Inability: Never true  . Transportation needs:    Medical: No    Non-medical: No  Tobacco Use  . Smoking status: Never Smoker  . Smokeless tobacco: Never Used  Substance and Sexual Activity  . Alcohol use: No  . Drug use: No  . Sexual activity: Yes    Birth control/protection: Post-menopausal  Lifestyle  . Physical  activity:    Days per week: 0 days    Minutes per session: 0 min  . Stress: Only a little  Relationships  . Social connections:    Talks on phone: More than three times a week    Gets together: Twice a week    Attends religious service: Never    Active member of club or organization: No    Attends meetings of clubs or organizations: Not on file    Relationship status: Married  . Intimate partner violence:    Fear of current or ex partner: No    Emotionally abused: No    Physically abused: No    Forced sexual activity: No  Other Topics Concern  . Not on file  Social History Narrative  . Not on file    FAMILY HISTORY: Family History  Problem Relation Age of Onset  . Diabetes Mother   . Congestive Heart Failure Mother   . Breast cancer Maternal Grandmother        dx 68s; deceased 57s  . Cancer Maternal Uncle 70       deceased 20s  . Other Father        No information on father or paternal relatives  . Breast cancer Other 91       paternal half-sister; also esophageal ca at 43; currently 42  . Diabetes Other   .  Hypertension Other   . Kidney disease Other   . Thyroid disease Other     ALLERGIES:  is allergic to morphine and related and peanut-containing drug products.  MEDICATIONS:  Current Outpatient Medications  Medication Sig Dispense Refill  . aspirin 81 MG tablet Take 81 mg by mouth daily.    . chlorhexidine (PERIDEX) 0.12 % solution Use as directed 15 mLs in the mouth or throat 2 (two) times daily. 473 mL 0  . dexamethasone (DECADRON) 4 MG tablet Take 2 tablets (8 mg total) by mouth 2 (two) times daily. Then again the day after chemo for 3 days. 25 tablet 0  . EPINEPHrine 0.3 mg/0.3 mL IJ SOAJ injection   12  . flecainide (TAMBOCOR) 50 MG tablet Take 50 mg by mouth 2 (two) times daily.     Marland Kitchen levothyroxine (SYNTHROID, LEVOTHROID) 175 MCG tablet Take 1 tablet (175 mcg total) by mouth daily before breakfast. 90 tablet 0  . lidocaine-prilocaine (EMLA) cream Apply to  affected area once 30 g 3  . lisinopril-hydrochlorothiazide (PRINZIDE,ZESTORETIC) 20-25 MG tablet Take 2 tablets by mouth daily. 180 tablet 3  . LORazepam (ATIVAN) 0.5 MG tablet Take 1 tablet (0.5 mg total) by mouth every 12 (twelve) hours as needed for anxiety (NAUSEA). 30 tablet 0  . metoprolol succinate (TOPROL-XL) 50 MG 24 hr tablet TAKE 1 TABLET BY MOUTH ONCE DAILY WITH  OR  IMMEDIATELY  FOLLOWING  A  MEAL 90 tablet 1  . Nutritional Supplements (JUICE PLUS FIBRE PO) Take 2 capsules by mouth daily. Fruit blend    . ondansetron (ZOFRAN) 8 MG tablet Take 1 tablet (8 mg total) by mouth 2 (two) times daily as needed for refractory nausea / vomiting. Start on day 3 after chemo. 30 tablet 1  . potassium chloride SA (K-DUR,KLOR-CON) 20 MEQ tablet Take 1 tablet (20 mEq total) by mouth daily. 30 tablet 0  . prochlorperazine (COMPAZINE) 10 MG tablet Take 1 tablet (10 mg total) by mouth every 6 (six) hours as needed (Nausea or vomiting). 30 tablet 1   No current facility-administered medications for this visit.    Facility-Administered Medications Ordered in Other Visits  Medication Dose Route Frequency Provider Last Rate Last Dose  . CARBOplatin (PARAPLATIN) 490 mg in sodium chloride 0.9 % 250 mL chemo infusion  490 mg Intravenous Once Earlie Server, MD 598 mL/hr at 09/11/18 1127 490 mg at 09/11/18 1127  . heparin lock flush 100 unit/mL  500 Units Intravenous Once Earlie Server, MD      . heparin lock flush 100 unit/mL  500 Units Intracatheter Once PRN Earlie Server, MD      . sodium chloride flush (NS) 0.9 % injection 10 mL  10 mL Intravenous PRN Earlie Server, MD   10 mL at 09/11/18 0820     PHYSICAL EXAMINATION: ECOG PERFORMANCE STATUS: 1 - Symptomatic but completely ambulatory Vitals:   09/11/18 0831  BP: (!) 153/80  Pulse: 79  Resp: 18  Temp: (!) 96.8 F (36 C)   Filed Weights   09/11/18 0831  Weight: (!) 303 lb 3.2 oz (137.5 kg)    Physical Exam  Constitutional: She is oriented to person, place, and  time. No distress.  Obese, walks with a walker  HENT:  Head: Normocephalic and atraumatic.  Mouth/Throat: Oropharynx is clear and moist.  Eyes: Pupils are equal, round, and reactive to light. EOM are normal. No scleral icterus.  Neck: Normal range of motion. Neck supple.  Cardiovascular: Normal rate, regular rhythm  and normal heart sounds.  Pulmonary/Chest: Effort normal. No respiratory distress. She has no wheezes.  Abdominal: Soft. Bowel sounds are normal. She exhibits no distension and no mass. There is no tenderness.  Musculoskeletal: Normal range of motion. She exhibits edema. She exhibits no deformity.  Chronic lower extremity edema.  1+  Neurological: She is alert and oriented to person, place, and time. No cranial nerve deficit. Coordination normal.  Skin: Skin is warm and dry. No rash noted. No erythema.  Psychiatric: She has a normal mood and affect. Her behavior is normal. Thought content normal.   LABORATORY DATA:  I have reviewed the data as listed Lab Results  Component Value Date   WBC 8.7 09/11/2018   HGB 7.9 (L) 09/11/2018   HCT 25.8 (L) 09/11/2018   MCV 104.9 (H) 09/11/2018   PLT 143 (L) 09/11/2018   Recent Labs    08/21/18 0835 08/28/18 1001 09/11/18 0808  NA 141 138 140  K 3.4* 3.6 3.3*  CL 101 99 103  CO2 '28 30 27  '$ GLUCOSE 104* 93 104*  BUN '12 14 17  '$ CREATININE 0.73 0.84 0.72  CALCIUM 9.2 9.0 9.0  GFRNONAA >60 >60 >60  GFRAA >60 >60 >60  PROT 7.1 6.9 6.8  ALBUMIN 3.5 3.7 3.5  AST '23 27 20  '$ ALT '18 22 20  '$ ALKPHOS 72 90 74  BILITOT 0.5 0.7 0.6   RADIOGRAPHIC STUDIES: I have personally reviewed the radiological images as listed and agreed with the findings in the report. NM Cardiac Muga rest: Calculated LEFT ventricular ejection fraction equals 45%   ASSESSMENT & PLAN:  1. Malignant neoplasm of lower-outer quadrant of right female breast, unspecified estrogen receptor status ([place])   Cancer Staging Malignant neoplasm of lower-outer quadrant of  right female breast [facility]) Staging form: Breast, AJCC 8th Edition - Clinical stage from 05/08/2018: Stage IIB (cT2, cN0, cM0, G3, ER-, PR-, HER2-) - Signed by Earlie Server, MD on 05/08/2018  #Triple negative breast cancer, clinically responding to treatment.  Labs are reviewed and discussed with patient.  Counts acceptable to proceed with cycle 6 docetaxel and carboplatin [AUC4] Anticipate she will have further decrease of her hemoglobin level.  We will proceed with blood transfusion see below. She will be finishing total 6 of neoadjuvant chemotherapy today. Plan repeat diagnostic mammogram of right breast in 2 weeks. Patient will need to see Dr. Bary Castilla for surgery after mammogram. If she has residual disease >1cm or LN positive, she can be a candidate for SWOG trial for adjuvant Pembrolizumab.    #Symptomatic anemia /anemia due to bone marrow suppression from chemotherapy.  Hemoglobin dropped to 7.9.  Patient is symptomatic, proceed with type and screen today and blood transfusion on 09/13/2018.  #Nausea and diarrhea after previous chemotherapy.  Patient feels IV fluid on day 3 has helped her symptoms. SinContinue home antiemetics, and low dose Ativan as instructed. ce she is receiving 1 unit of PRBC transfusion which is 250 cc, will give her another 250 cc of IV fluid for hydration.  Discussed with patient.  #Fatigue and shortness of breath with exertion.  Due to anemia.  Anticipate to improve after blood transfusion. #Grade 1 thrombocytopenia, stable. # Hypokalemia, potassium 3.3 today.  Continue K dur 20 mEq supplements.  Refill sent to pharmacy.  We spent sufficient time to discuss many aspect of care, questions were answered to patient's satisfaction.  Return of visit:  To be determined. I will see patient 2 weeks after surgery to go over pathology.  cc RN navigator Sheena  to coordinate.  Cc Dr.Byrnett  Earlie Server, MD, PhD Hematology Oncology [facility] at [facility] Pager- [phone] 09/11/2018

## 2018-09-12 ENCOUNTER — Encounter: Payer: Self-pay | Admitting: *Deleted

## 2018-09-12 NOTE — Progress Notes (Signed)
  Oncology Nurse Navigator Documentation  Navigator Location: [facility] (09/12/18 1400)   )Navigator Encounter Type: Telephone (09/12/18 1400) Telephone: Wendy Marsh Call (09/12/18 1400)                       Barriers/Navigation Needs: Coordination of Care (09/12/18 1400)                          Time Spent with Patient: 30 (09/12/18 1400)   Called patient and scheduled her an appointment to see Dr. Bary Castilla for surgical planning post chemotherapy per Dr. Collie Siad request.  Patient is scheduled to see him after her mammogram on 10/09/18 @ 11:30.  She is to let me know her surgery date, so I can schedule her to return to see Dr. Tasia Catchings 2 weeks after surgery.

## 2018-09-13 ENCOUNTER — Ambulatory Visit: Payer: BLUE CROSS/BLUE SHIELD

## 2018-09-13 ENCOUNTER — Inpatient Hospital Stay: Payer: BLUE CROSS/BLUE SHIELD

## 2018-09-13 VITALS — BP 127/73 | HR 62 | Temp 96.0°F | Resp 18

## 2018-09-13 DIAGNOSIS — C50511 Malignant neoplasm of lower-outer quadrant of right female breast: Secondary | ICD-10-CM | POA: Diagnosis not present

## 2018-09-13 DIAGNOSIS — C50919 Malignant neoplasm of unspecified site of unspecified female breast: Secondary | ICD-10-CM

## 2018-09-13 DIAGNOSIS — D649 Anemia, unspecified: Secondary | ICD-10-CM

## 2018-09-13 MED ORDER — PEGFILGRASTIM-CBQV 6 MG/0.6ML ~~LOC~~ SOSY
6.0000 mg | PREFILLED_SYRINGE | Freq: Once | SUBCUTANEOUS | Status: AC
  Administered 2018-09-13: 6 mg via SUBCUTANEOUS
  Filled 2018-09-13: qty 0.6

## 2018-09-13 MED ORDER — DIPHENHYDRAMINE HCL 25 MG PO CAPS
25.0000 mg | ORAL_CAPSULE | Freq: Once | ORAL | Status: AC
  Administered 2018-09-13: 25 mg via ORAL
  Filled 2018-09-13: qty 1

## 2018-09-13 MED ORDER — SODIUM CHLORIDE 0.9% IV SOLUTION
250.0000 mL | Freq: Once | INTRAVENOUS | Status: AC
  Administered 2018-09-13: 250 mL via INTRAVENOUS
  Filled 2018-09-13: qty 250

## 2018-09-13 MED ORDER — HEPARIN SOD (PORK) LOCK FLUSH 100 UNIT/ML IV SOLN
500.0000 [IU] | Freq: Every day | INTRAVENOUS | Status: AC | PRN
  Administered 2018-09-13: 500 [IU]
  Filled 2018-09-13: qty 5

## 2018-09-13 MED ORDER — ACETAMINOPHEN 325 MG PO TABS
650.0000 mg | ORAL_TABLET | Freq: Once | ORAL | Status: AC
  Administered 2018-09-13: 650 mg via ORAL
  Filled 2018-09-13: qty 2

## 2018-09-13 MED ORDER — SODIUM CHLORIDE 0.9 % IV SOLN
Freq: Once | INTRAVENOUS | Status: AC
  Administered 2018-09-13: 09:00:00 via INTRAVENOUS
  Filled 2018-09-13: qty 250

## 2018-09-14 LAB — TYPE AND SCREEN
ABO/RH(D): A POS
Antibody Screen: NEGATIVE
Unit division: 0

## 2018-09-14 LAB — BPAM RBC
Blood Product Expiration Date: 201912192359
ISSUE DATE / TIME: 201912121032
Unit Type and Rh: 600

## 2018-09-22 LAB — ABO/RH: ABO/RH(D): A POS

## 2018-10-05 ENCOUNTER — Ambulatory Visit
Admission: RE | Admit: 2018-10-05 | Discharge: 2018-10-05 | Disposition: A | Discharge status: Home / Self Care | Payer: PRIVATE HEALTH INSURANCE | Source: Ambulatory Visit | Attending: Oncology | Admitting: Oncology

## 2018-10-05 DIAGNOSIS — C50511 Malignant neoplasm of lower-outer quadrant of right female breast: Secondary | ICD-10-CM

## 2018-10-09 ENCOUNTER — Encounter: Payer: Self-pay | Admitting: *Deleted

## 2018-10-09 ENCOUNTER — Ambulatory Visit: Payer: BLUE CROSS/BLUE SHIELD | Admitting: General Surgery

## 2018-10-09 NOTE — Progress Notes (Signed)
  Oncology Nurse Navigator Documentation  Navigator Location: [facility] (10/09/18 1400)   )Navigator Encounter Type: Telephone (10/09/18 1400) Telephone: Incoming Call (10/09/18 1400)                       Barriers/Navigation Needs: Coordination of Care (10/09/18 1400)                          Time Spent with Patient: 30 (10/09/18 1400)   Patient called with concerns if it was ok to wait until the 23rd to see her surgeon.  Discussed with Dr. Tasia Catchings.  Patient completed neoadjuvant chemotherapy about 4 weeks ago.  Dr. Tasia Catchings believes it will be fine to wait, but will discuss her case at breast conference on Monday.  Patient was informed that her case will be discussed and if the consensus agreed differently we would contact her.  She is agreeable.

## 2018-10-25 ENCOUNTER — Ambulatory Visit: Payer: BLUE CROSS/BLUE SHIELD | Admitting: General Surgery

## 2018-10-25 ENCOUNTER — Ambulatory Visit (INDEPENDENT_AMBULATORY_CARE_PROVIDER_SITE_OTHER): Payer: PRIVATE HEALTH INSURANCE

## 2018-10-25 ENCOUNTER — Ambulatory Visit: Payer: PRIVATE HEALTH INSURANCE | Admitting: Surgery

## 2018-10-25 ENCOUNTER — Encounter: Payer: Self-pay | Admitting: Surgery

## 2018-10-25 ENCOUNTER — Other Ambulatory Visit: Payer: Self-pay

## 2018-10-25 ENCOUNTER — Encounter: Payer: Self-pay | Admitting: *Deleted

## 2018-10-25 ENCOUNTER — Other Ambulatory Visit: Payer: Self-pay | Admitting: Surgery

## 2018-10-25 VITALS — BP 158/78 | HR 72 | Temp 97.3°F | Resp 18 | Ht 63.0 in | Wt 298.0 lb

## 2018-10-25 DIAGNOSIS — Z171 Estrogen receptor negative status [ER-]: Secondary | ICD-10-CM | POA: Diagnosis not present

## 2018-10-25 DIAGNOSIS — C50511 Malignant neoplasm of lower-outer quadrant of right female breast: Secondary | ICD-10-CM

## 2018-10-25 NOTE — Progress Notes (Signed)
Patient contacted and notified about Pre-admit appointment scheduled for tomorrow, 10-26-18 at 11 am.  The patient also notified to arrive at the [facility] at 9 am on 10-29-18-day of surgery.  She verbalizes understanding.

## 2018-10-25 NOTE — Patient Instructions (Signed)
We have spoken today about removing a lump in your breast. This will be done  by Dr. Tama High at [facility].  You will most likely be able to leave the hospital several hours after your surgery. Rarely, a patient needs to stay over night but this is a possibility.  Plan to tenatively be off work for 1-2 weeks following the surgery and may return with approximately 4 more weeks of a lifting restriction, no greater than 15 lbs.    Lumpectomy A lumpectomy is a form of "breast conserving" or "breast preservation" surgery. It may also be referred to as a partial mastectomy. During a lumpectomy, the portion of the breast that contains the cancerous tumor or breast mass (the lump) is removed. Some normal tissue around the lump may also be removed to make sure all of the tumor has been removed.  LET [facility] CARE PROVIDER KNOW ABOUT:  Any allergies you have.  All medicines you are taking, including vitamins, herbs, eye drops, creams, and over-the-counter medicines.  Previous problems you or members of your family have had with the use of anesthetics.  Any blood disorders you have.  Previous surgeries you have had.  Medical conditions you have. RISKS AND COMPLICATIONS Generally, this is a safe procedure. However, problems can occur and include:  Bleeding.  Infection.  Pain.  Temporary swelling.  Change in the shape of the breast, particularly if a large portion is removed. BEFORE THE PROCEDURE  Ask your health care provider about changing or stopping your regular medicines. This is especially important if you are taking diabetes medicines or blood thinners.  Do not eat or drink anything after midnight on the night before the procedure or as directed by your health care provider. Ask your health care provider if you can take a sip of water with any approved medicines.  On the day of surgery, your health care provider will use a mammogram or ultrasound to locate and mark the tumor in  your breast. These markings on your breast will show where the cut (incision) will be made. PROCEDURE   An IV tube will be put into one of your veins.  You may be given medicine to help you relax before the surgery (sedative). You will be given one of the following:  A medicine that numbs the area (local anesthetic).  A medicine that makes you fall asleep (general anesthetic).  Your health care provider will use a kind of electric scalpel that uses heat to minimize bleeding (electrocautery knife).  A curved incision (like a smile or frown) that follows the natural curve of your breast is made, to allow for minimal scarring and better healing.  The tumor will be removed with some of the surrounding tissue. This will be sent to the lab for analysis. Your health care provider may also remove your lymph nodes at this time if needed.  Sometimes, but not always, a rubber tube called a drain will be surgically inserted into your breast area or armpit to collect excess fluid that may accumulate in the space where the tumor was. This drain is connected to a plastic bulb on the outside of your body. This drain creates suction to help remove the fluid.  The incisions will be closed with stitches (sutures).  A bandage may be placed over the incisions. AFTER THE PROCEDURE  You will be taken to the recovery area.  You will be given medicine for pain.  A small rubber drain may be placed in the  breast for 2-3 days to prevent a collection of blood (hematoma) from developing in the breast. You will be given instructions on caring for the drain before you go home.  A pressure bandage (dressing) will be applied for 1-2 days to prevent bleeding. Ask your health care provider how to care for your bandage at home.   This information is not intended to replace advice given to you by your health care provider. Make sure you discuss any questions you have with your health care provider.   Document Released:  10/31/2006 Document Revised: 10/10/2014 Document Reviewed: 02/22/2013 Elsevier Interactive Patient Education [employer].

## 2018-10-25 NOTE — Progress Notes (Signed)
Patient's surgery to be scheduled for 10-29-18 at [facility] with Dr. Rosana Hoes. She is aware she will need to check in at the [facility]. Patient to be contacted with arrival time once [facility] has arranged.  The patient is aware she will need to Pre-Admit. Patient will check in at the [place], [address] (first floor). Patient aware she will be contacted once [facility] has arranged.   The patient is aware to call the office should she have further questions.

## 2018-10-25 NOTE — Progress Notes (Signed)
Surgical Clinic Progress/Follow-up Note   HPI:  63 y.o. Female presents to clinic for follow-up/pre-operative evaluation of her biopsy-proven invasive Right breast cancer, now s/p neoadjuvant chemotherapy via placed CVC with subcutaneous port. Patient happily reports she just recently completed her 6th/final cycle of neoadjuvant chemotherapy and returns today to discuss surgery. She reports she's uncertain whether she can feel what she says was formerly a palpable lower Right breast mass and denies nipple discharge, breast pain, fever/chills, unintentional weight loss, N/V, CP, or SOB.  Review of Systems:  Constitutional: denies any other weight loss, fever, chills, or sweats  Eyes: denies any other vision changes, history of eye injury  ENT: denies sore throat, hearing problems  Respiratory: denies shortness of breath, wheezing  Cardiovascular: denies chest pain, palpitations  Breast: mass, nipple discharge, and pain as per interval history Gastrointestinal: denies abdominal pain, N/V, or diarrhea Musculoskeletal: denies any other joint pains or cramps  Skin: Denies any other rashes or skin discolorations  Neurological: denies any other headache, dizziness, weakness  Psychiatric: denies any other depression, anxiety  All other review of systems: otherwise negative   Vital Signs:  BP (!) 158/78   Pulse 72   Temp (!) 97.3 F (36.3 C) (Temporal)   Resp 18   Ht 5\' 3"  (1.6 m)   Wt 298 lb (135.2 kg)   LMP 03/17/2009 (Approximate)   SpO2 96%   BMI 52.79 kg/m    Physical Exam:  Constitutional:  -- Obese body habitus  -- Awake, alert, and oriented x3  Eyes:  -- Pupils equally round and reactive to light  -- No scleral icterus  Ear, nose, throat:  -- No jugular venous distension  -- No nasal drainage, bleeding Pulmonary:  -- No crackles -- Equal breath sounds bilaterally -- Breathing non-labored at rest Cardiovascular:  -- S1, S2 present  -- No pericardial  rubs Breasts: -- Large pendulous breasts bilaterally -- Questionably palpable Right breast ridge along the inferior aspect of patient's pectoralis major, though not reliably certain whether this represents the mass described on recent Right breast diagnostic mammogram with focused ultrasound -- No obviously breast mass otherwise bilaterally and no nipple discharge -- No axillary lymphadenopathy appreciated bilaterally Gastrointestinal:  -- Soft, nontender, non-distended, no guarding/rebound  -- No abdominal masses appreciated, pulsatile or otherwise  Musculoskeletal / Integumentary:  -- Wounds or skin discoloration: None appreciated  -- Extremities: B/L UE and LE FROM, hands and feet warm Neurologic:  -- Motor function: intact and symmetric  -- Sensation: intact and symmetric   Laboratory studies:  CBC Latest Ref Rng & Units 09/11/2018 08/28/2018 08/21/2018  WBC 4.0 - 10.5 K/uL 8.7 16.8(H) 9.4  Hemoglobin 12.0 - 15.0 g/dL 7.9(L) 9.4(L) 8.6(L)  Hematocrit 36.0 - 46.0 % 25.8(L) 30.0(L) 28.4(L)  Platelets 150 - 400 K/uL 143(L) 392 451(H)   CMP Latest Ref Rng & Units 09/11/2018 08/28/2018 08/21/2018  Glucose 70 - 99 mg/dL 104(H) 93 104(H)  BUN 8 - 23 mg/dL 17 14 12   Creatinine 0.44 - 1.00 mg/dL 0.72 0.84 0.73  Sodium 135 - 145 mmol/L 140 138 141  Potassium 3.5 - 5.1 mmol/L 3.3(L) 3.6 3.4(L)  Chloride 98 - 111 mmol/L 103 99 101  CO2 22 - 32 mmol/L 27 30 28   Calcium 8.9 - 10.3 mg/dL 9.0 9.0 9.2  Total Protein 6.5 - 8.1 g/dL 6.8 6.9 7.1  Total Bilirubin 0.3 - 1.2 mg/dL 0.6 0.7 0.5  Alkaline Phos 38 - 126 U/L 74 90 72  AST 15 - 41  U/L 20 27 23   ALT 0 - 44 U/L 20 22 18    Imaging:  Point of Care Right Breast Focused Ultrasound (10/25/2018) - performed to assess localization of Right breast mass without wire-localization for upcoming surgery Image saved of questionable visualization of hypoechoic Right breast mass consistent with dimensions described on recent Right breast diagnostic  mammogram with focused ultrasound, though appears closely associated with chest wall, raising some concern regarding reliability of pre-surgical imaging to identify mass for excision on day of surgery without confirmatory wire-localization.  Right Breast Diagnostic Mammogram with Focused Ultrasound (10/05/2018) The known malignancy in the right lower central breast presents as a partially visualized mass measuring approximately 2.3 cm in greatest dimension mammographically. No other suspicious masses are seen in the right breast.  On physical exam, the abnormality is difficult to palpate.  Targeted ultrasound shows Right breast mass at 6 o'clock position, 9 cm from the nipple, an elongated hypoechoic mass which measures 3.3  cm by 0.9 by 3.8 cm, prior measurement of 2.7 by 2.9 by 2.8 cm. There  is no sonographic evidence of Right axillary lymphadenopathy.  Bilateral Diagnostic Mammogram with Focused Right Breast Ultrasound (04/27/2018) ACR Breast Density Category a: The breast tissue is almost entirely fatty.  There is a spiculated mass within the outer RIGHT breast, at far posterior depth, measuring ~3 cm greatest dimension, corresponding  to the area of palpable lump, with overlying skin marker in place.  There are no additional masses, suspicious calcifications, or secondary signs of malignancy identified within the RIGHT breast. There are no masses, suspicious calcifications or secondary signs  of malignancy identified within the LEFT breast.  Targeted ultrasound shows an irregular hypoechoic mass  in the RIGHT breast at the 6 o'clock axis, 9 cm from the nipple,  with internal vascularity, measuring 2.9 cm greatest dimension,  corresponding to the palpable lump. Ultrasound of the RIGHT axilla  shows no enlarged or morphologically abnormal lymph nodes.  Assessment:  63 y.o. yo Female with a problem list including...  Patient Active Problem List   Diagnosis Date Noted  .  Hypotension due to drugs 05/30/2018  . 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) 05/08/2018  . Malignant neoplasm of lower-outer quadrant of right female breast ([place]) 05/08/2018  . Goals of care, counseling/discussion 05/08/2018  . Morbid obesity ([place]) 03/06/2018  . Allergy to peanuts 02/06/2018  . Hypertension 02/26/2016  . Thyroid activity decreased 02/26/2016  . Paroxysmal atrial fibrillation ([place]) 01/22/2016  . Primary osteoarthritis of right knee 09/22/2015  . Combined fat and carbohydrate induced hyperlipemia 04/02/2015  . Benign essential hypertension 04/02/2015  . Mitral insufficiency 02/20/2014    presents to clinic for pre-op evaluation, doing well s/p neoadjuvant systemic chemotherapy for biopsy-proven Right breast invasive adenocarcinoma, complicated by comorbidities including morbid obesity (BMI 53), HTN, HLD, atrial fibrillation not on therapeutic anticoagulation, thyroid disease (not otherwise specified), osteoporosis, osteoarthritis, and chronic lower back pain.  Plan:   - results of recent imaging studies discussed  - all risks, benefits, and alternatives to Right breast lumpectomy with wire-localization and Right axillary sentinel lymph node(s) biopsy were discussed with the patient and her family, all of their questions were answered to their expressed satisfaction, patient expresses she wishes to proceed, and informed consent was obtained.  - bedside ultrasound performed to assess need for pre-operative wire-localization  - will plan to proceed with Right breast lumpectomy with wire-localization and Right axillary sentinel lymph node(s) biopsy pending anesthesia and OR availability  - anticipate return to  clinic 2 weeks following above planned procedure  - instructed to call office if any questions or concerns  All of the above recommendations were discussed with the patient and patient's husband, and all of patient's and family's questions were answered to their  expressed satisfaction.  -- Marilynne Drivers Rosana Hoes, MD, [place]: [facility] - Partnering for exceptional care. Office: [phone]

## 2018-10-25 NOTE — H&P (View-Only) (Signed)
Surgical Clinic Progress/Follow-up Note   HPI:  63 y.o. Female presents to clinic for follow-up/pre-operative evaluation of her biopsy-proven invasive Right breast cancer, now s/p neoadjuvant chemotherapy via placed CVC with subcutaneous port. Patient happily reports she just recently completed her 6th/final cycle of neoadjuvant chemotherapy and returns today to discuss surgery. She reports she's uncertain whether she can feel what she says was formerly a palpable lower Right breast mass and denies nipple discharge, breast pain, fever/chills, unintentional weight loss, N/V, CP, or SOB.  Review of Systems:  Constitutional: denies any other weight loss, fever, chills, or sweats  Eyes: denies any other vision changes, history of eye injury  ENT: denies sore throat, hearing problems  Respiratory: denies shortness of breath, wheezing  Cardiovascular: denies chest pain, palpitations  Breast: mass, nipple discharge, and pain as per interval history Gastrointestinal: denies abdominal pain, N/V, or diarrhea Musculoskeletal: denies any other joint pains or cramps  Skin: Denies any other rashes or skin discolorations  Neurological: denies any other headache, dizziness, weakness  Psychiatric: denies any other depression, anxiety  All other review of systems: otherwise negative   Vital Signs:  BP (!) 158/78   Pulse 72   Temp (!) 97.3 F (36.3 C) (Temporal)   Resp 18   Ht 5\' 3"  (1.6 m)   Wt 298 lb (135.2 kg)   LMP 03/17/2009 (Approximate)   SpO2 96%   BMI 52.79 kg/m    Physical Exam:  Constitutional:  -- Obese body habitus  -- Awake, alert, and oriented x3  Eyes:  -- Pupils equally round and reactive to light  -- No scleral icterus  Ear, nose, throat:  -- No jugular venous distension  -- No nasal drainage, bleeding Pulmonary:  -- No crackles -- Equal breath sounds bilaterally -- Breathing non-labored at rest Cardiovascular:  -- S1, S2 present  -- No pericardial  rubs Breasts: -- Large pendulous breasts bilaterally -- Questionably palpable Right breast ridge along the inferior aspect of patient's pectoralis major, though not reliably certain whether this represents the mass described on recent Right breast diagnostic mammogram with focused ultrasound -- No obviously breast mass otherwise bilaterally and no nipple discharge -- No axillary lymphadenopathy appreciated bilaterally Gastrointestinal:  -- Soft, nontender, non-distended, no guarding/rebound  -- No abdominal masses appreciated, pulsatile or otherwise  Musculoskeletal / Integumentary:  -- Wounds or skin discoloration: None appreciated  -- Extremities: B/L UE and LE FROM, hands and feet warm Neurologic:  -- Motor function: intact and symmetric  -- Sensation: intact and symmetric   Laboratory studies:  CBC Latest Ref Rng & Units 09/11/2018 08/28/2018 08/21/2018  WBC 4.0 - 10.5 K/uL 8.7 16.8(H) 9.4  Hemoglobin 12.0 - 15.0 g/dL 7.9(L) 9.4(L) 8.6(L)  Hematocrit 36.0 - 46.0 % 25.8(L) 30.0(L) 28.4(L)  Platelets 150 - 400 K/uL 143(L) 392 451(H)   CMP Latest Ref Rng & Units 09/11/2018 08/28/2018 08/21/2018  Glucose 70 - 99 mg/dL 104(H) 93 104(H)  BUN 8 - 23 mg/dL 17 14 12   Creatinine 0.44 - 1.00 mg/dL 0.72 0.84 0.73  Sodium 135 - 145 mmol/L 140 138 141  Potassium 3.5 - 5.1 mmol/L 3.3(L) 3.6 3.4(L)  Chloride 98 - 111 mmol/L 103 99 101  CO2 22 - 32 mmol/L 27 30 28   Calcium 8.9 - 10.3 mg/dL 9.0 9.0 9.2  Total Protein 6.5 - 8.1 g/dL 6.8 6.9 7.1  Total Bilirubin 0.3 - 1.2 mg/dL 0.6 0.7 0.5  Alkaline Phos 38 - 126 U/L 74 90 72  AST 15 - 41  U/L 20 27 23   ALT 0 - 44 U/L 20 22 18    Imaging:  Point of Care Right Breast Focused Ultrasound (10/25/2018) - performed to assess localization of Right breast mass without wire-localization for upcoming surgery Image saved of questionable visualization of hypoechoic Right breast mass consistent with dimensions described on recent Right breast diagnostic  mammogram with focused ultrasound, though appears closely associated with chest wall, raising some concern regarding reliability of pre-surgical imaging to identify mass for excision on day of surgery without confirmatory wire-localization.  Right Breast Diagnostic Mammogram with Focused Ultrasound (10/05/2018) The known malignancy in the right lower central breast presents as a partially visualized mass measuring approximately 2.3 cm in greatest dimension mammographically. No other suspicious masses are seen in the right breast.  On physical exam, the abnormality is difficult to palpate.  Targeted ultrasound shows Right breast mass at 6 o'clock position, 9 cm from the nipple, an elongated hypoechoic mass which measures 3.3  cm by 0.9 by 3.8 cm, prior measurement of 2.7 by 2.9 by 2.8 cm. There  is no sonographic evidence of Right axillary lymphadenopathy.  Bilateral Diagnostic Mammogram with Focused Right Breast Ultrasound (04/27/2018) ACR Breast Density Category a: The breast tissue is almost entirely fatty.  There is a spiculated mass within the outer RIGHT breast, at far posterior depth, measuring ~3 cm greatest dimension, corresponding  to the area of palpable lump, with overlying skin marker in place.  There are no additional masses, suspicious calcifications, or secondary signs of malignancy identified within the RIGHT breast. There are no masses, suspicious calcifications or secondary signs  of malignancy identified within the LEFT breast.  Targeted ultrasound shows an irregular hypoechoic mass  in the RIGHT breast at the 6 o'clock axis, 9 cm from the nipple,  with internal vascularity, measuring 2.9 cm greatest dimension,  corresponding to the palpable lump. Ultrasound of the RIGHT axilla  shows no enlarged or morphologically abnormal lymph nodes.  Assessment:  63 y.o. yo Female with a problem list including...  Patient Active Problem List   Diagnosis Date Noted  .  Hypotension due to drugs 05/30/2018  . 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 Malignant neoplasm of lower-outer quadrant of right female breast ([place]) 05/08/2018  . Goals of care, counseling/discussion 05/08/2018  . Morbid obesity ([place]) 03/06/2018  . Allergy to peanuts 02/06/2018  . Hypertension 02/26/2016  . Thyroid activity decreased 02/26/2016  . Paroxysmal atrial fibrillation ([place]) 01/22/2016  . Primary osteoarthritis of right knee 09/22/2015  . Combined fat and carbohydrate induced hyperlipemia 04/02/2015  . Benign essential hypertension 04/02/2015  . Mitral insufficiency 02/20/2014    presents to clinic for pre-op evaluation, doing well s/p neoadjuvant systemic chemotherapy for biopsy-proven Right breast invasive adenocarcinoma, complicated by comorbidities including morbid obesity (BMI 53), HTN, HLD, atrial fibrillation not on therapeutic anticoagulation, thyroid disease (not otherwise specified), osteoporosis, osteoarthritis, and chronic lower back pain.  Plan:   - results of recent imaging studies discussed  - all risks, benefits, and alternatives to Right breast lumpectomy with wire-localization and Right axillary sentinel lymph node(s) biopsy were discussed with the patient and her family, all of their questions were answered to their expressed satisfaction, patient expresses she wishes to proceed, and informed consent was obtained.  - bedside ultrasound performed to assess need for pre-operative wire-localization  - will plan to proceed with Right breast lumpectomy with wire-localization and Right axillary sentinel lymph node(s) biopsy pending anesthesia and OR availability  - anticipate return to  clinic 2 weeks following above planned procedure  - instructed to call office if any questions or concerns  All of the above recommendations were discussed with the patient and patient's husband, and all of patient's and family's questions were answered to their  expressed satisfaction.  -- Marilynne Drivers Rosana Hoes, MD, [place]: [facility] - Partnering for exceptional care. Office: [phone]

## 2018-10-26 ENCOUNTER — Other Ambulatory Visit: Payer: Self-pay

## 2018-10-26 ENCOUNTER — Encounter
Admission: RE | Admit: 2018-10-26 | Discharge: 2018-10-26 | Disposition: A | Discharge status: Home / Self Care | Payer: PRIVATE HEALTH INSURANCE | Source: Ambulatory Visit | Attending: Surgery | Admitting: Surgery

## 2018-10-26 DIAGNOSIS — I4891 Unspecified atrial fibrillation: Secondary | ICD-10-CM | POA: Diagnosis not present

## 2018-10-26 DIAGNOSIS — I10 Essential (primary) hypertension: Secondary | ICD-10-CM | POA: Diagnosis not present

## 2018-10-26 DIAGNOSIS — Z6841 Body Mass Index (BMI) 40.0 and over, adult: Secondary | ICD-10-CM | POA: Diagnosis not present

## 2018-10-26 DIAGNOSIS — Z01812 Encounter for preprocedural laboratory examination: Secondary | ICD-10-CM | POA: Insufficient documentation

## 2018-10-26 DIAGNOSIS — Z171 Estrogen receptor negative status [ER-]: Secondary | ICD-10-CM | POA: Diagnosis not present

## 2018-10-26 DIAGNOSIS — Z79899 Other long term (current) drug therapy: Secondary | ICD-10-CM | POA: Diagnosis not present

## 2018-10-26 DIAGNOSIS — C50511 Malignant neoplasm of lower-outer quadrant of right female breast: Secondary | ICD-10-CM | POA: Diagnosis present

## 2018-10-26 DIAGNOSIS — Z9221 Personal history of antineoplastic chemotherapy: Secondary | ICD-10-CM | POA: Diagnosis not present

## 2018-10-26 LAB — CBC WITH DIFFERENTIAL/PLATELET
ABS IMMATURE GRANULOCYTES: 0.01 10*3/uL (ref 0.00–0.07)
BASOS PCT: 0 %
Basophils Absolute: 0 10*3/uL (ref 0.0–0.1)
Eosinophils Absolute: 0.1 10*3/uL (ref 0.0–0.5)
Eosinophils Relative: 1 %
HCT: 33.6 % — ABNORMAL LOW (ref 36.0–46.0)
HEMOGLOBIN: 10.4 g/dL — AB (ref 12.0–15.0)
Immature Granulocytes: 0 %
Lymphocytes Relative: 22 %
Lymphs Abs: 1.3 10*3/uL (ref 0.7–4.0)
MCH: 31.2 pg (ref 26.0–34.0)
MCHC: 31 g/dL (ref 30.0–36.0)
MCV: 100.9 fL — ABNORMAL HIGH (ref 80.0–100.0)
Monocytes Absolute: 0.5 10*3/uL (ref 0.1–1.0)
Monocytes Relative: 8 %
NRBC: 0 % (ref 0.0–0.2)
Neutro Abs: 4.1 10*3/uL (ref 1.7–7.7)
Neutrophils Relative %: 69 %
Platelets: 286 10*3/uL (ref 150–400)
RBC: 3.33 MIL/uL — ABNORMAL LOW (ref 3.87–5.11)
RDW: 14.6 % (ref 11.5–15.5)
WBC: 6 10*3/uL (ref 4.0–10.5)

## 2018-10-26 LAB — BASIC METABOLIC PANEL
ANION GAP: 9 (ref 5–15)
BUN: 12 mg/dL (ref 8–23)
CO2: 31 mmol/L (ref 22–32)
Calcium: 8.9 mg/dL (ref 8.9–10.3)
Chloride: 99 mmol/L (ref 98–111)
Creatinine, Ser: 0.81 mg/dL (ref 0.44–1.00)
GFR calc Af Amer: >60 mL/min (ref >60)
GFR calc non Af Amer: >60 mL/min (ref >60)
Glucose, Bld: 85 mg/dL (ref 70–99)
POTASSIUM: 3.3 mmol/L — AB (ref 3.5–5.1)
Sodium: 139 mmol/L (ref 135–145)

## 2018-10-26 NOTE — Pre-Procedure Instructions (Signed)
[facility] Name Value Ref Range  Vent Rate (bpm) 78   PR Interval (msec) 216   QRS Interval (msec) 110   QT Interval (msec) 418   QTc (msec) 476   Other Result Information  This result has an attachment that is not available.  Result Narrative  Sinus rhythm 1st degree AV block Low voltage QRS Borderline ECG When compared with ECG of 20-Mar-2018 11:45, No significant change was found I reviewed and concur with this report. Electronically signed [id number] MD, Darnell Level ([id number]) on 05/29/2018 8:58:09 AM  Status

## 2018-10-26 NOTE — Pre-Procedure Instructions (Signed)
Echo not Component Name Value Ref Range  LV Ejection Fraction (%) 55   Aortic Valve Regurgitation Grade none   Aortic Valve Stenosis Grade none   Aortic Valve Max Velocity (m/s) 1.6 m/sec  Aortic Valve Stenosis Mean Gradient (mmHg) 4.5 mmHg  Mitral Valve Regurgitation Grade mild   Mitral Valve Stenosis Grade none   Tricuspid Valve Regurgitation Grade mild   LV End Diastolic Diameter (cm) 4.9 cm  LV End Systolic Diameter (cm) 3.6 cm  LV Septum Wall Thickness (cm) 1.2 cm  LV Posterior Wall Thickness (cm) 1.2 cm  Left Atrium Diameter (cm) 4 cm  Result Narrative   CARDIOLOGY DEPARTMENT MYKIA, HOL[place] [id number] [facility] Acct #: [id number] [address], [place], [place] [address] Date: 05/30/2018 08: 74 AM  Adult Female Age: 63 yrs ECHOCARDIOGRAM REPORTOutpatient  KC^^KCWC  STUDY:CHEST WALL TAPE:MD1: Serafina Royals JAY ECHO:YesDOPPLER:Yes FILE:BP: 130/78 mmHg  COLOR:Yes CONTRAST:No MACHINE:Philips  RV BIOPSY:No3D:NoSOUND QLTY:ModerateHeight: 63 in MEDIUM:NoneWeight: 304 lb  BSA: 2.3 m2 _________________________________________________________________________________________ HISTORY: Recent A.Fib  REASON: Assess, LV function  INDICATION: I48.0 Paroxysmal A-fib _________________________________________________________________________________________ ECHOCARDIOGRAPHIC MEASUREMENTS 2D  DIMENSIONS AORTAValues Normal Range MAIN PA ValuesNormal Range Annulus: nm*[2.1-2.5] PA Main: nm* [1.5-2.1] Aorta Sin: nm*[2.7-3.3]RIGHT VENTRICLE ST Junction: nm*[2.3-2.9] RV Base: 3.4 cm[<4.2] Asc.Aorta: nm*[2.3-3.1]RV Mid: nm* [<3.5] LEFT VENTRICLERV Length: nm* [<8.6] LVIDd: 4.9 cm [3.9-5.3]INFERIOR VENA CAVA LVIDs: 3.6 cmMax. IVC: nm* [<=2.1]  FS: 26.1 % [>25]Min. IVC: nm* SWT: 1.2 cm [0.5-0.9]------------------ PWT: 1.2 cm [0.5-0.9]nm* - not measured LEFT ATRIUM LA Diam: 4.0 cm [2.7-3.8] LA A4C Area: nm*[<20] LA Volume: nm*[22-52] _________________________________________________________________________________________ ECHOCARDIOGRAPHIC DESCRIPTIONS AORTIC ROOT  Size: Normal  Dissection: INDETERM FOR DISSECTION AORTIC VALVE  Leaflets: Tricuspid Morphology: Normal  Mobility: Fully mobile LEFT VENTRICLE  Size: NormalAnterior: Normal Contraction: Normal Lateral: Normal  Closest EF: >55% (Estimated)Septal: Normal LV Masses: No Masses Apical: Normal LVH: MILD LVHInferior: Normal Posterior: Normal  Dias.FxClass: (Grade 1) relaxation abnormal, E/A reversal MITRAL VALVE  Leaflets: NormalMobility: Fully mobile   Morphology: Normal LEFT ATRIUM  Size: Normal LA Masses: No masses IA Septum: Normal IAS MAIN PA  Size: Normal PULMONIC VALVE  Morphology: NormalMobility: Fully mobile RIGHT VENTRICLE RV Masses: No Masses Size: Normal Free Wall: Normal Contraction: Normal TRICUSPID VALVE  Leaflets: NormalMobility: Fully mobile  Morphology: Normal RIGHT ATRIUM  Size: NormalRA Other: None RA Mass: No masses PERICARDIUM Fluid: No effusion INFERIOR VENACAVA  Size: Normal Normal respiratory collapse _________________________________________________________________________________________  DOPPLER ECHO and OTHER SPECIAL PROCEDURES  Aortic: No ARNo AS  155.9 cm/sec peak vel9.7 mmHg peak grad  4.5 mmHg mean grad 3.0 cm^2 by DOPPLER  Mitral: MILD MRNo MS  MV Inflow E Vel = 81.0 cm/secMV Annulus E'Vel = nm*  E/E'Ratio = nm* Tricuspid: MILD TRNo TS Pulmonary: TRIVIAL PR No PS _________________________________________________________________________________________ INTERPRETATION NORMAL LEFT VENTRICULAR SYSTOLIC FUNCTION WITH MILD LVH NORMAL RIGHT VENTRICULAR SYSTOLIC FUNCTION MILD VALVULAR REGURGITATION (See above) NO VALVULAR STENOSIS _________________________________________________________________________________________ Electronically signed byMD Serafina Royals on 05/30/2018 09: 40 AM  Performed By: Johnathan Hausen, RDCS, RVT  Ordering Physician:  Serafina Royals _________________________________________________________________________________________  Other Result Information  Interface, Text Results In - 05/30/2018  9:41 AM EDT                        CARDIOLOGY DEPARTMENT                 [place], [place]  [place] #: [id number]           [address], [place], [place] [address]       Date: 05/30/2018 08: 43 AM                                                              Adult   Female   Age: 80 yrs           ECHOCARDIOGRAM REPORT                              Outpatient                                                              KC^^KCWC      STUDY:CHEST WALL               TAPE:                    MD1: KOWALSKI, BRUCE JAY       ECHO:Yes    DOPPLER:Yes       FILE:                    BP: 130/78 mmHg      COLOR:Yes   CONTRAST:No     MACHINE:Philips  RV BIOPSY:No          3D:No  SOUND QLTY:Moderate            Height: 63 in     MEDIUM:None                                              Weight: 304 lb                                                              BSA: 2.3 m2 _________________________________________________________________________________________               HISTORY: Recent A.Fib                REASON: Assess, LV function            INDICATION: I48.0 Paroxysmal A-fib _________________________________________________________________________________________ ECHOCARDIOGRAPHIC MEASUREMENTS 2D DIMENSIONS AORTA                  Values   Normal Range   MAIN PA         Values    Normal Range  Annulus: nm*          [2.1-2.5]         PA Main: nm*       [1.5-2.1]             Aorta Sin: nm*          [2.7-3.3]    RIGHT VENTRICLE           ST Junction: nm*          [2.3-2.9]         RV Base: 3.4 cm    [<4.2]             Asc.Aorta: nm*          [2.3-3.1]          RV Mid: nm*       [<3.5] LEFT  VENTRICLE                                      RV Length: nm*       [<8.6]                 LVIDd: 4.9 cm       [3.9-5.3]    INFERIOR VENA CAVA                 LVIDs: 3.6 cm                        Max. IVC: nm*       [<=2.1]                    FS: 26.1 %       [>25]            Min. IVC: nm*                   SWT: 1.2 cm       [0.5-0.9]    ------------------                   PWT: 1.2 cm       [0.5-0.9]    nm* - not measured LEFT ATRIUM               LA Diam: 4.0 cm       [2.7-3.8]           LA A4C Area: nm*          [<20]             LA Volume: nm*          [22-52] _________________________________________________________________________________________ ECHOCARDIOGRAPHIC DESCRIPTIONS AORTIC ROOT                  Size: Normal            Dissection: INDETERM FOR DISSECTION AORTIC VALVE              Leaflets: Tricuspid                   Morphology: Normal              Mobility: Fully mobile LEFT VENTRICLE                  Size: Normal                        Anterior: Normal  Contraction: Normal                         Lateral: Normal            Closest EF: >55% (Estimated)                Septal: Normal             LV Masses: No Masses                       Apical: Normal                   LVH: MILD LVH                      Inferior: Normal                                                     Posterior: Normal          Dias.FxClass: (Grade 1) relaxation abnormal, E/A reversal MITRAL VALVE              Leaflets: Normal                        Mobility: Fully mobile            Morphology: Normal LEFT ATRIUM                  Size: Normal                       LA Masses: No masses             IA Septum: Normal IAS MAIN PA                  Size: Normal PULMONIC VALVE            Morphology: Normal                        Mobility: Fully mobile RIGHT VENTRICLE             RV Masses: No Masses                         Size: Normal             Free Wall: Normal                      Contraction: Normal TRICUSPID VALVE              Leaflets: Normal                        Mobility: Fully mobile            Morphology: Normal RIGHT ATRIUM                  Size: Normal                        RA Other: None               RA Mass: No masses PERICARDIUM  Fluid: No effusion INFERIOR VENACAVA                  Size: Normal Normal respiratory collapse _________________________________________________________________________________________  DOPPLER ECHO and OTHER SPECIAL PROCEDURES                Aortic: No AR                      No AS                        155.9 cm/sec peak vel      9.7 mmHg peak grad                        4.5 mmHg mean grad         3.0 cm^2 by DOPPLER                Mitral: MILD MR                    No MS                        MV Inflow E Vel = 81.0 cm/sec      MV Annulus E'Vel = nm*                        E/E'Ratio = nm*             Tricuspid: MILD TR                    No TS             Pulmonary: TRIVIAL PR                 No PS _________________________________________________________________________________________ INTERPRETATION NORMAL LEFT VENTRICULAR SYSTOLIC FUNCTION   WITH MILD LVH NORMAL RIGHT VENTRICULAR SYSTOLIC FUNCTION MILD VALVULAR REGURGITATION (See above) NO VALVULAR STENOSIS _________________________________________________________________________________________ Electronically signed by      MD Serafina Royals on 05/30/2018 09: 40 AM          Performed By: Johnathan Hausen, RDCS, RVT    Ordering Physician: Serafina Royals   e

## 2018-10-26 NOTE — Patient Instructions (Addendum)
Your procedure is scheduled on: October 29, 2018 Report to [place]  At 9:00 on Monday October 29, 2018   REMEMBER: Instructions that are not followed completely may result in serious medical risk, up to and including death; or upon the discretion of your surgeon and anesthesiologist your surgery may need to be rescheduled.  Do not eat food after midnight the night before surgery.  No gum chewing, lozengers or hard candies.  You may however, drink CLEAR liquids up to 2 hours before you are scheduled to arrive for your surgery. Do not drink anything within 2 hours of the start of your surgery.  Clear liquids include: - water  - apple juice without pulp - clear gatorade - black coffee or tea (Do NOT add milk or creamers to the coffee or tea) Do NOT drink anything that is not on this list.  Type 1 and Type 2 diabetics should only drink water.  No Alcohol for 24 hours before or after surgery.  No Smoking including e-cigarettes for 24 hours prior to surgery.  No chewable tobacco products for at least 6 hours prior to surgery.  No nicotine patches on the day of surgery.  On the morning of surgery brush your teeth with toothpaste and water, you may rinse your mouth with mouthwash if you wish. Do not swallow any toothpaste or mouthwash.  Notify your doctor if there is any change in your medical condition (cold, fever, infection).  Do not wear jewelry, make-up, hairpins, clips or nail polish.  Do not wear lotions, powders, or perfumes or deodorant  Do not shave 48 hours prior to surgery.   Contacts and dentures may not be worn into surgery.  Do not bring valuables to the hospital, including drivers license, insurance or credit cards.  [facility] is not responsible for any belongings or valuables.   TAKE THESE MEDICATIONS THE MORNING OF SURGERY: Flecainide Metoprolol  Use CHG Soap as directed on instruction sheet.   Stop Anti-inflammatories (NSAIDS) such as Advil, Aleve,  Ibuprofen, Motrin, Naproxen, Naprosyn and Aspirin based products such as Excedrin, Goodys Powder, BC Powder. (May take Tylenol or Acetaminophen if needed.)  Stop ANY OVER THE COUNTER supplements until after surgery. (May continue Vitamin D, Vitamin B, and multivitamin.)  Wear comfortable clothing (specific to your surgery type) to the hospital.  Plan for stool softeners for home use.  If you are being admitted to the hospital overnight, leave your suitcase in the car. After surgery it may be brought to your room.  If you are being discharged the day of surgery, you will not be allowed to drive home. You will need a responsible adult to drive you home and stay with you that night.   If you are taking public transportation, you will need to have a responsible adult with you. Please confirm with your physician that it is acceptable to use public transportation.   Please call [phone] if you have any questions about these instructions.

## 2018-10-26 NOTE — Pre-Procedure Instructions (Signed)
Abnormal labs faxed to Dr Rosana Hoes office and office called about potassium 3.3 and that pt will need supplement. Pt currently taking potassium but will finish tomorrow

## 2018-10-28 MED ORDER — DEXTROSE 5 % IV SOLN
3.0000 g | INTRAVENOUS | Status: AC
  Administered 2018-10-29: 3 g via INTRAVENOUS
  Filled 2018-10-28: qty 3

## 2018-10-29 ENCOUNTER — Ambulatory Visit: Payer: PRIVATE HEALTH INSURANCE | Admitting: Anesthesiology

## 2018-10-29 ENCOUNTER — Ambulatory Visit
Admission: RE | Admit: 2018-10-29 | Discharge: 2018-10-29 | Disposition: A | Discharge status: Home / Self Care | Payer: PRIVATE HEALTH INSURANCE | Source: Ambulatory Visit | Attending: Surgery | Admitting: Surgery

## 2018-10-29 ENCOUNTER — Other Ambulatory Visit: Payer: Self-pay | Admitting: Surgery

## 2018-10-29 ENCOUNTER — Ambulatory Visit: Payer: PRIVATE HEALTH INSURANCE

## 2018-10-29 ENCOUNTER — Other Ambulatory Visit: Payer: Self-pay

## 2018-10-29 ENCOUNTER — Encounter
Admission: RE | Disposition: A | Discharge status: Home / Self Care | Payer: Self-pay | Source: Home / Self Care | Attending: Surgery

## 2018-10-29 ENCOUNTER — Encounter: Payer: Self-pay | Admitting: *Deleted

## 2018-10-29 ENCOUNTER — Ambulatory Visit
Admission: RE | Admit: 2018-10-29 | Discharge: 2018-10-29 | Disposition: A | Discharge status: Home / Self Care | Payer: PRIVATE HEALTH INSURANCE | Attending: Surgery | Admitting: Surgery

## 2018-10-29 DIAGNOSIS — Z79899 Other long term (current) drug therapy: Secondary | ICD-10-CM | POA: Insufficient documentation

## 2018-10-29 DIAGNOSIS — Z171 Estrogen receptor negative status [ER-]: Principal | ICD-10-CM

## 2018-10-29 DIAGNOSIS — Z9221 Personal history of antineoplastic chemotherapy: Secondary | ICD-10-CM | POA: Insufficient documentation

## 2018-10-29 DIAGNOSIS — C50511 Malignant neoplasm of lower-outer quadrant of right female breast: Secondary | ICD-10-CM

## 2018-10-29 DIAGNOSIS — I10 Essential (primary) hypertension: Secondary | ICD-10-CM | POA: Insufficient documentation

## 2018-10-29 DIAGNOSIS — I4891 Unspecified atrial fibrillation: Secondary | ICD-10-CM | POA: Insufficient documentation

## 2018-10-29 DIAGNOSIS — Z6841 Body Mass Index (BMI) 40.0 and over, adult: Secondary | ICD-10-CM | POA: Insufficient documentation

## 2018-10-29 DIAGNOSIS — R7981 Abnormal blood-gas level: Secondary | ICD-10-CM

## 2018-10-29 HISTORY — PX: BREAST LUMPECTOMY WITH NEEDLE LOCALIZATION AND AXILLARY SENTINEL LYMPH NODE BX: SHX5760

## 2018-10-29 HISTORY — PX: BREAST LUMPECTOMY: SHX2

## 2018-10-29 LAB — BASIC METABOLIC PANEL
Anion gap: 9 (ref 5–15)
BUN: 13 mg/dL (ref 8–23)
CO2: 30 mmol/L (ref 22–32)
Calcium: 9.2 mg/dL (ref 8.9–10.3)
Chloride: 101 mmol/L (ref 98–111)
Creatinine, Ser: 0.78 mg/dL (ref 0.44–1.00)
GFR calc Af Amer: >60 mL/min (ref >60)
GFR calc non Af Amer: >60 mL/min (ref >60)
Glucose, Bld: 92 mg/dL (ref 70–99)
Potassium: 3.4 mmol/L — ABNORMAL LOW (ref 3.5–5.1)
SODIUM: 140 mmol/L (ref 135–145)

## 2018-10-29 SURGERY — BREAST LUMPECTOMY WITH NEEDLE LOCALIZATION AND AXILLARY SENTINEL LYMPH NODE BX
Anesthesia: General | Laterality: Right

## 2018-10-29 MED ORDER — IPRATROPIUM-ALBUTEROL 0.5-2.5 (3) MG/3ML IN SOLN
RESPIRATORY_TRACT | Status: AC
  Filled 2018-10-29: qty 3

## 2018-10-29 MED ORDER — BUPIVACAINE HCL 0.5 % IJ SOLN
INTRAMUSCULAR | Status: DC | PRN
  Administered 2018-10-29: 20 mL

## 2018-10-29 MED ORDER — LACTATED RINGERS IV SOLN
INTRAVENOUS | Status: DC
  Administered 2018-10-29 (×2): via INTRAVENOUS

## 2018-10-29 MED ORDER — OXYCODONE-ACETAMINOPHEN 5-325 MG PO TABS
ORAL_TABLET | ORAL | Status: AC
  Administered 2018-10-29: 1 via ORAL
  Filled 2018-10-29: qty 1

## 2018-10-29 MED ORDER — CHLORHEXIDINE GLUCONATE CLOTH 2 % EX PADS: 6.0000 | MEDICATED_PAD | Freq: Once | CUTANEOUS | Status: DC

## 2018-10-29 MED ORDER — IPRATROPIUM-ALBUTEROL 0.5-2.5 (3) MG/3ML IN SOLN
3.0000 mL | Freq: Once | RESPIRATORY_TRACT | Status: AC
  Administered 2018-10-29: 3 mL via RESPIRATORY_TRACT

## 2018-10-29 MED ORDER — TECHNETIUM TC 99M SULFUR COLLOID FILTERED
0.7450 | Freq: Once | INTRAVENOUS | Status: AC | PRN
  Administered 2018-10-29: 0.745 via INTRADERMAL

## 2018-10-29 MED ORDER — OXYCODONE-ACETAMINOPHEN 5-325 MG PO TABS
1.0000 | ORAL_TABLET | Freq: Once | ORAL | Status: AC
  Administered 2018-10-29: 1 via ORAL

## 2018-10-29 MED ORDER — ONDANSETRON HCL 4 MG/2ML IJ SOLN
INTRAMUSCULAR | Status: DC | PRN
  Administered 2018-10-29: 4 mg via INTRAVENOUS

## 2018-10-29 MED ORDER — PROPOFOL 10 MG/ML IV BOLUS
INTRAVENOUS | Status: DC | PRN
  Administered 2018-10-29: 200 mg via INTRAVENOUS

## 2018-10-29 MED ORDER — GABAPENTIN 300 MG PO CAPS: 300.0000 mg | ORAL_CAPSULE | ORAL | Status: DC

## 2018-10-29 MED ORDER — ONDANSETRON HCL 4 MG/2ML IJ SOLN: 4.0000 mg | Freq: Once | INTRAMUSCULAR | Status: DC | PRN

## 2018-10-29 MED ORDER — ACETAMINOPHEN 500 MG PO TABS
1000.0000 mg | ORAL_TABLET | ORAL | Status: AC
  Administered 2018-10-29: 1000 mg via ORAL

## 2018-10-29 MED ORDER — OXYCODONE-ACETAMINOPHEN 5-325 MG PO TABS: 1.0000 | ORAL_TABLET | ORAL | 0 refills | Status: DC | PRN

## 2018-10-29 MED ORDER — ISOSULFAN BLUE 1 % ~~LOC~~ SOLN
SUBCUTANEOUS | Status: AC
  Filled 2018-10-29: qty 5

## 2018-10-29 MED ORDER — SUGAMMADEX SODIUM 500 MG/5ML IV SOLN
INTRAVENOUS | Status: DC | PRN
  Administered 2018-10-29: 269.4 mg via INTRAVENOUS

## 2018-10-29 MED ORDER — FENTANYL CITRATE (PF) 100 MCG/2ML IJ SOLN
INTRAMUSCULAR | Status: DC | PRN
  Administered 2018-10-29 (×3): 50 ug via INTRAVENOUS

## 2018-10-29 MED ORDER — FENTANYL CITRATE (PF) 100 MCG/2ML IJ SOLN: 25.0000 ug | INTRAMUSCULAR | Status: DC | PRN

## 2018-10-29 MED ORDER — SUCCINYLCHOLINE CHLORIDE 20 MG/ML IJ SOLN
INTRAMUSCULAR | Status: DC | PRN
  Administered 2018-10-29: 140 mg via INTRAVENOUS

## 2018-10-29 MED ORDER — DEXAMETHASONE SODIUM PHOSPHATE 10 MG/ML IJ SOLN
INTRAMUSCULAR | Status: DC | PRN
  Administered 2018-10-29: 10 mg via INTRAVENOUS

## 2018-10-29 MED ORDER — LIDOCAINE HCL (PF) 1 % IJ SOLN
INTRAMUSCULAR | Status: AC
  Filled 2018-10-29: qty 30

## 2018-10-29 MED ORDER — BUPIVACAINE HCL (PF) 0.5 % IJ SOLN
INTRAMUSCULAR | Status: AC
  Filled 2018-10-29: qty 30

## 2018-10-29 MED ORDER — LIDOCAINE HCL (CARDIAC) PF 100 MG/5ML IV SOSY
PREFILLED_SYRINGE | INTRAVENOUS | Status: DC | PRN
  Administered 2018-10-29: 100 mg via INTRAVENOUS

## 2018-10-29 MED ORDER — FAMOTIDINE 20 MG PO TABS
20.0000 mg | ORAL_TABLET | Freq: Once | ORAL | Status: AC
  Administered 2018-10-29: 20 mg via ORAL

## 2018-10-29 MED ORDER — ROCURONIUM BROMIDE 100 MG/10ML IV SOLN
INTRAVENOUS | Status: DC | PRN
  Administered 2018-10-29: 20 mg via INTRAVENOUS
  Administered 2018-10-29: 10 mg via INTRAVENOUS

## 2018-10-29 SURGICAL SUPPLY — 37 items
BLADE SURG 15 STRL LF DISP TIS (BLADE) ×1 IMPLANT
BLADE SURG 15 STRL SS (BLADE) ×1
CANISTER SUCT 1200ML W/VALVE (MISCELLANEOUS) ×2 IMPLANT
CHLORAPREP W/TINT 26ML (MISCELLANEOUS) ×2 IMPLANT
CNTNR SPEC 2.5X3XGRAD LEK (MISCELLANEOUS) ×3
CONT SPEC 4OZ STER OR WHT (MISCELLANEOUS) ×3
CONTAINER SPEC 2.5X3XGRAD LEK (MISCELLANEOUS) ×3 IMPLANT
COVER PROBE FLX POLY STRL (MISCELLANEOUS) ×2 IMPLANT
COVER WAND RF STERILE (DRAPES) IMPLANT
DERMABOND ADVANCED (GAUZE/BANDAGES/DRESSINGS) ×1
DERMABOND ADVANCED .7 DNX12 (GAUZE/BANDAGES/DRESSINGS) ×1 IMPLANT
DEVICE DUBIN SPECIMEN MAMMOGRA (MISCELLANEOUS) ×2 IMPLANT
DRAPE LAPAROTOMY TRNSV 106X77 (MISCELLANEOUS) ×2 IMPLANT
DRAPE SHEET LG 3/4 BI-LAMINATE (DRAPES) ×2 IMPLANT
ELECT CAUTERY BLADE 6.4 (BLADE) ×2 IMPLANT
ELECT REM PT RETURN 9FT ADLT (ELECTROSURGICAL) ×2
ELECTRODE REM PT RTRN 9FT ADLT (ELECTROSURGICAL) ×1 IMPLANT
GLOVE BIO SURGEON STRL SZ7 (GLOVE) ×2 IMPLANT
GLOVE INDICATOR 7.5 STRL GRN (GLOVE) ×2 IMPLANT
GOWN STRL REUS W/ TWL LRG LVL3 (GOWN DISPOSABLE) ×2 IMPLANT
GOWN STRL REUS W/TWL LRG LVL3 (GOWN DISPOSABLE) ×2
KIT TURNOVER KIT A (KITS) ×2 IMPLANT
LABEL OR SOLS (LABEL) ×2 IMPLANT
NDL SAFETY ECLIPSE 18X1.5 (NEEDLE) ×1 IMPLANT
NEEDLE HYPO 18GX1.5 SHARP (NEEDLE) ×1
NEEDLE HYPO 25X1 1.5 SAFETY (NEEDLE) ×4 IMPLANT
PACK BASIN MINOR ARMC (MISCELLANEOUS) ×2 IMPLANT
SLEVE PROBE SENORX GAMMA FIND (MISCELLANEOUS) IMPLANT
SUT MNCRL 4-0 (SUTURE) ×1
SUT MNCRL 4-0 27XMFL (SUTURE) ×1
SUT SILK 2 0 SH (SUTURE) ×4 IMPLANT
SUT VIC AB 3-0 SH 27 (SUTURE) ×3
SUT VIC AB 3-0 SH 27X BRD (SUTURE) ×3 IMPLANT
SUTURE MNCRL 4-0 27XMF (SUTURE) ×1 IMPLANT
SYR 10ML LL (SYRINGE) ×4 IMPLANT
SYR BULB 3OZ (MISCELLANEOUS) IMPLANT
WATER STERILE IRR 1000ML POUR (IV SOLUTION) ×2 IMPLANT

## 2018-10-29 NOTE — Interval H&P Note (Signed)
History and Physical Interval Note:  10/29/2018 11:46 AM  Wendy Marsh  has presented today for surgery, with the diagnosis of RIGHT BREAST CANCER  The various methods of treatment have been discussed with the patient and family. After consideration of risks, benefits and other options for treatment, the patient has consented to  Procedure(s): BREAST LUMPECTOMY WITH NEEDLE LOCALIZATION AND SENTINEL LYMPH NODE BX (Right) as a surgical intervention .  The patient's history has been reviewed, patient examined, no change in status, stable for surgery.  I have reviewed the patient's chart and labs.  Questions were answered to the patient's satisfaction.     Vickie Epley

## 2018-10-29 NOTE — Anesthesia Procedure Notes (Signed)
Procedure Name: Intubation Date/Time: 10/29/2018 12:48 PM Performed by: Philbert Riser, CRNA Pre-anesthesia Checklist: Patient identified, Emergency Drugs available, Suction available, Patient being monitored and Timeout performed Patient Re-evaluated:Patient Re-evaluated prior to induction Oxygen Delivery Method: Circle system utilized and Simple face mask Preoxygenation: Pre-oxygenation with 100% oxygen Induction Type: IV induction Ventilation: Mask ventilation without difficulty and Oral airway inserted - appropriate to patient size Laryngoscope Size: McGraph and 3 Grade View: Grade III Tube type: Oral Number of attempts: 3 Airway Equipment and Method: Stylet,  Bougie stylet and Video-laryngoscopy Placement Confirmation: ETT inserted through vocal cords under direct vision,  positive ETCO2 and breath sounds checked- equal and bilateral Secured at: 22 cm Tube secured with: Tape Dental Injury: Teeth and Oropharynx as per pre-operative assessment

## 2018-10-29 NOTE — OR Nursing (Signed)
Lab tech in for metB draw 1030 am

## 2018-10-29 NOTE — Discharge Instructions (Addendum)
AMBULATORY SURGERY  DISCHARGE INSTRUCTIONS   1) The drugs that you were given will stay in your system until tomorrow so for the next 24 hours you should not:  A) Drive an automobile B) Make any legal decisions C) Drink any alcoholic beverage   2) You may resume regular meals tomorrow.  Today it is better to start with liquids and gradually work up to solid foods.  You may eat anything you prefer, but it is better to start with liquids, then soup and crackers, and gradually work up to solid foods.   3) Please notify your doctor immediately if you have any unusual bleeding, trouble breathing, redness and pain at the surgery site, drainage, fever, or pain not relieved by medication.    4) Additional Instructions:        Please contact your physician with any problems or Same Day Surgery at [phone], Monday through Friday 6 am to 4 pm, or [place] at [facility] number at [phone].In addition to included general post-operative instructions for Right Breast Lumpectomy with Right Axillary Sentinel Lymph Node(s) Biopsy,  Diet: Resume home heart healthy diet.   Activity: No heavy lifting >15 - 20 pounds (children, pets, laundry, garbage) or strenuous activity until follow-up, but light activity and walking are encouraged. Do not drive or drink alcohol if taking narcotic pain medications.  Wound care: 2 days after surgery (Wednesday afternoon, 1/29), you may shower/get incision wet with soapy water and pat dry (do not rub incisions), but no baths or submerging incision underwater until follow-up.   Medications: Resume all home medications. For mild to moderate pain: acetaminophen (Tylenol) or ibuprofen/naproxen (if no kidney disease). Combining Tylenol with alcohol can substantially increase your risk of causing liver disease. Narcotic pain medications, if prescribed, can be used for severe pain, though may cause nausea, constipation, and drowsiness. Do not combine Tylenol  and Percocet (or similar) within a 6 hour period as Percocet (and similar) contain(s) Tylenol. If you do not need the narcotic pain medication, you do not need to fill the prescription.  Call office [phone]) at any time if any questions, worsening pain, fevers/chills, bleeding, drainage from incision site, or other concerns.

## 2018-10-29 NOTE — Transfer of Care (Signed)
Immediate Anesthesia Transfer of Care Note  Patient: Wendy Marsh  Procedure(s) Performed: BREAST LUMPECTOMY WITH NEEDLE LOCALIZATION AND SENTINEL LYMPH NODE BX (Right )  Patient Location: PACU  Anesthesia Type:General  Level of Consciousness: sedated  Airway & Oxygen Therapy: Patient Spontanous Breathing and Patient connected to face mask oxygen  Post-op Assessment: Report given to RN and Post -op Vital signs reviewed and stable  Post vital signs: Reviewed and stable  Last Vitals:  Vitals Value Taken Time  BP    Temp    Pulse    Resp    SpO2      Last Pain:  Vitals:   10/29/18 1033  TempSrc: Temporal  PainSc: 0-No pain         Complications: No apparent anesthesia complications

## 2018-10-29 NOTE — Anesthesia Preprocedure Evaluation (Signed)
Anesthesia Evaluation  Patient identified by MRN, date of birth, ID band Patient awake    Reviewed: Allergy & Precautions, NPO status , Patient's Chart, lab work & pertinent test results, reviewed documented beta blocker date and time   History of Anesthesia Complications Negative for: history of anesthetic complications  Airway Mallampati: III  TM Distance: >3 FB Neck ROM: Full    Dental  (+) Partial Lower, Poor Dentition, Dental Advidsory Given   Pulmonary neg pulmonary ROS, neg shortness of breath, neg sleep apnea, neg COPD,    breath sounds clear to auscultation- rhonchi (-) wheezing      Cardiovascular hypertension, Pt. on medications (-) angina(-) CAD, (-) Past MI, (-) Cardiac Stents and (-) CABG + dysrhythmias Atrial Fibrillation (-) Valvular Problems/Murmurs Rhythm:Regular Rate:Normal - Systolic murmurs and - Diastolic murmurs Echo 0/78/67: NORMAL LEFT VENTRICULAR SYSTOLIC FUNCTION WITH MILD LVH NORMAL RIGHT VENTRICULAR SYSTOLIC FUNCTION MILD VALVULAR REGURGITATION (mild MR, mild TR) NO VALVULAR STENOSIS EF >55%   Neuro/Psych negative neurological ROS  negative psych ROS   GI/Hepatic negative GI ROS, Neg liver ROS,   Endo/Other  neg diabetesHypothyroidism Morbid obesity  Renal/GU negative Renal ROS     Musculoskeletal  (+) Arthritis ,   Abdominal (+) + obese,   Peds  Hematology negative hematology ROS (+)   Anesthesia Other Findings Past Medical History: No date: Arthritis No date: Atrial fibrillation (HCC) No date: Dysrhythmia     Comment:  afib No date: Hyperlipidemia No date: Hypertension No date: Hypothyroidism No date: Lumbago No date: Osteoporosis No date: Thyroid disease   Reproductive/Obstetrics                             Anesthesia Physical  Anesthesia Plan  ASA: III  Anesthesia Plan: General   Post-op Pain Management:    Induction: Intravenous  PONV  Risk Score and Plan: 2 and Ondansetron, Dexamethasone, Midazolam, Treatment may vary due to age or medical condition and Promethazine  Airway Management Planned: Oral ETT  Additional Equipment:   Intra-op Plan:   Post-operative Plan: Extubation in OR  Informed Consent: I have reviewed the patients History and Physical, chart, labs and discussed the procedure including the risks, benefits and alternatives for the proposed anesthesia with the patient or authorized representative who has indicated his/her understanding and acceptance.     Dental advisory given  Plan Discussed with: CRNA and Anesthesiologist  Anesthesia Plan Comments:         Anesthesia Quick Evaluation

## 2018-10-29 NOTE — Anesthesia Postprocedure Evaluation (Signed)
Anesthesia Post Note  Patient: Wendy Marsh  Procedure(s) Performed: BREAST LUMPECTOMY WITH NEEDLE LOCALIZATION AND SENTINEL LYMPH NODE BX (Right )  Patient location during evaluation: PACU Anesthesia Type: General Level of consciousness: awake and alert Pain management: pain level controlled Vital Signs Assessment: post-procedure vital signs reviewed and stable Respiratory status: spontaneous breathing and respiratory function stable Cardiovascular status: stable Anesthetic complications: no     Last Vitals:  Vitals:   10/29/18 1840 10/29/18 1845  BP:    Pulse: 86 89  Resp: 15   Temp:  (!) 36.2 C  SpO2: 97% 94%    Last Pain:  Vitals:   10/29/18 1845  TempSrc:   PainSc: 0-No pain                 Elyssa Pendelton K

## 2018-10-29 NOTE — Op Note (Signed)
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 s/p neoadjuvant chemotherapy (icd-10: C50.511)   PROCEDURE(S):  1.) Right partial mastectomy/lumpectomy with image-guided wire localization (cpt: 19301) 2.) Excisional biopsy of Right axillary deep sentinel lymph nodes using radiolabel/tracer and lymphazurine blue dye (cpt's: 45809 and 98338)   INTRAOPERATIVE FINDINGS: Wire-localized mammary tissue removed in its entirety (no palpable mass appreciated), confirmed central within specimen intra-operatively by radiology despite no radiographic biopsy clip identified on specimen with initially "grey tissue" at inferior margin for which additional inferior tissues was excised onto upper chest wall beyond infra-mammary fold and additional deep tissue was likewise excised, maximum pre-SLN biopsy radiotracer count: 1000, maximum radiotracer count in 1st specimen: 1100 without blue dye, maximum radiotracer count in 2nd specimen: 150 without blue dye, final radiotracer count: >20   INTRAVENOUS FLUIDS: 1200 mL crystalloid    ESTIMATED BLOOD LOSS: Minimal (<20 mL)   URINE OUTPUT: No Foley    SPECIMENS: Right breast lumpectomy/partial mastectomy (oriented with long lateral silk suture, short superior silk suture, and double suture to deep margin) and deep Right axillary sentinel lymph node(s)   IMPLANTS: None   DRAINS: None   COMPLICATIONS: None apparent   CONDITION AT END OF PROCEDURE: Hemodynamically stable and extubated   DISPOSITION OF PATIENT: PACU   INDICATIONS FOR PROCEDURE:  63 y.o. female presented for evaluation of abnormal Right  breast diagnostic mammogram with focal breast ultrasound and abnormal core needle biopsy findings, demonstrating ER- PR- Her2- invasive mammary carcinoma, for which patient underwent neoadjuvant chemotherapy and excision with sentinel lymph node biopsy was advised. All risks, benefits, and alternatives to lumpectomy vs mastectomy were discussed with the patient, all of patient's questions were answered to her expressed satisfaction, patient elected to proceed with lumpectomy/partial mastectomy with wire-localization and excisional biopsy of sentinel lymph node(s), and informed consent was accordingly obtained and documented at that time.   DETAILS OF PROCEDURE: Pre-operative wire-localization was performed by radiology, and provided single localization study image was reviewed. Patient was brought to the operating suite and appropriately identified. General anesthesia was administered along with appropriate pre-operative antibiotics, and endotracheal intubation was performed by anesthetist. In supine position, operative site was prepped and draped in the usual sterile fashion, and following a brief time out, maximum Right axillary radiotracer count was checked using probe/detector. 1 mL aliquots x 5 of lymphazurine blue dye were injected peri-areola and massaged into the tissue x ~5 minutes.   Local anesthetic was then injected, and a lower breast incision parallel to and just superior to her inframammary crease was made using a #15 blade scalpel starting medial to insertion site of localization wire, around which appropriate thickness skin flaps were created, wire was brought into the incision, and excision was continued deep to chest wall to include the localization wire/needle in its entirety with appropriately wide margins. Long lateral suture, short superior suture, and double deep suture were used to orient the specimen, which was then handed off the field for radiographic assessment and pathology  processing. Hemostasis was confirmed, and the wound was re-approximated in layers using buried interrupted 3-0 Vicryl for dermis and 4-0 Monocryl running subcuticular suture to re-approximate epidermis.   Attention was then directed to the Right axilla, where axillary  hair line, pectoralis muscle, and latissimus dorsal muscle were marked using a skin marking pen. Local anesthetic was injected over the site of maximum radiotracer count, and a ~2 cm curvilinear incision was made using #15 blade scalpel and extended deep through subcutaneous tissue using blunt dissection and selective electrocautery, tying any small venous and lymphatic branches using silk ties. Lymph node(s) with peak radiotracer counts were identified, ligated, and excised with a radiotracer count of >1100. No blue dye was visualized in patient's axilla. A second specimen was similarly identified and excised with radiotracer count of 150, after which background radiotracer counts were <20 in the remaining axillary bed. Radiographic assessment confirmed that the entire localizing wire was received prior to completion of closure. However, the marker clip placed at core needle biopsy site was not visualized 7 mm inferior to localization wire despite more than adequate margins around localization wire. Intra-operative pathology also suggested possibility of focal "grey tissue" among the inferior specimen margin despite the initial specimen including fatty tissue from the chest wall inferior to the patient's inframammary crease. Accordingly, additional inferior and deep tissue was excised to chest wall muscle. Still, no clip was present, but all margins were confirmed to be uninvolved with tumor and following discussion with radiology, excision of additional tissue was not advised. Right axillary dermis was re-approximated buried interrupted 3-0 Vicryl for dermis and 4-0 Monocryl for epidermis, after which skin was then cleaned and dried, and sterile  Dermabond skin glue was applied and allowed to dry.  Patient was then safely able to be extubated, awakened, and transferred to PACU for post-operative monitoring and care.   I was present for all aspects of the above procedure, and no operative complications were apparent.

## 2018-10-29 NOTE — Anesthesia Post-op Follow-up Note (Signed)
Anesthesia QCDR form completed.        

## 2018-10-30 ENCOUNTER — Encounter: Payer: Self-pay | Admitting: Surgery

## 2018-10-31 LAB — SURGICAL PATHOLOGY

## 2018-11-02 ENCOUNTER — Telehealth: Payer: Self-pay | Admitting: *Deleted

## 2018-11-02 NOTE — Telephone Encounter (Signed)
MD visit 2 weeks after surgery. Surgery was on 1/27 per Benjamine Mola 11/02/18 scheduling message Appt was scheduled as requested Patient is aware of the date and time of the scheduled appt.

## 2018-11-06 ENCOUNTER — Other Ambulatory Visit: Payer: Self-pay | Admitting: Family Medicine

## 2018-11-06 ENCOUNTER — Encounter: Payer: PRIVATE HEALTH INSURANCE | Admitting: Surgery

## 2018-11-06 DIAGNOSIS — I10 Essential (primary) hypertension: Secondary | ICD-10-CM

## 2018-11-06 NOTE — Telephone Encounter (Signed)
Courtesy refill. Message left to schedule OV.

## 2018-11-08 ENCOUNTER — Other Ambulatory Visit: Payer: Self-pay

## 2018-11-08 ENCOUNTER — Encounter: Payer: Self-pay | Admitting: Surgery

## 2018-11-08 ENCOUNTER — Ambulatory Visit (INDEPENDENT_AMBULATORY_CARE_PROVIDER_SITE_OTHER): Payer: PRIVATE HEALTH INSURANCE | Admitting: Surgery

## 2018-11-08 VITALS — BP 197/81 | HR 83 | Temp 97.7°F | Ht 63.0 in | Wt 301.0 lb

## 2018-11-08 DIAGNOSIS — Z4889 Encounter for other specified surgical aftercare: Secondary | ICD-10-CM

## 2018-11-08 DIAGNOSIS — Z171 Estrogen receptor negative status [ER-]: Secondary | ICD-10-CM

## 2018-11-08 DIAGNOSIS — C50511 Malignant neoplasm of lower-outer quadrant of right female breast: Secondary | ICD-10-CM

## 2018-11-08 DIAGNOSIS — C50919 Malignant neoplasm of unspecified site of unspecified female breast: Secondary | ICD-10-CM

## 2018-11-08 NOTE — Patient Instructions (Signed)
The patient has been asked to return to the office in 6 months with a left  diagnostic mammogram.

## 2018-11-09 ENCOUNTER — Encounter: Payer: Self-pay | Admitting: Surgery

## 2018-11-09 NOTE — Progress Notes (Signed)
Surgical Clinic Progress/Follow-up Note   HPI:  63 y.o. Female presents to clinic for post-op follow-up 2 weeks s/p Right breast lumpectomy and excision of deep axillary sentinel lymph nodes Rosana Hoes, 10/29/2018) following neoadjuvant chemotherapy. Patient denies any peri-incisional pain, mass(es), or nipple discharge with normal BM's, denies N/V, fever/chills, CP, or SOB.  Review of Systems:  Constitutional: denies fever/chills  Respiratory: denies shortness of breath, wheezing  Cardiovascular: denies chest pain, palpitations  Breast: breast pain, nipple discharge, and mass(es) as per interval history Skin: Denies any other rashes or skin discolorations except post-surgical wounds as per interval history  Vital Signs:  BP (!) 197/81   Pulse 83   Temp 97.7 F (36.5 C) (Skin)   Ht 5\' 3"  (1.6 m)   Wt (!) 301 lb (136.5 kg)   LMP 03/17/2009 (Approximate)   SpO2 98%   BMI 53.32 kg/m    Physical Exam:  Constitutional:  -- Obese body habitus  -- Awake, alert, and oriented x3  Pulmonary:  -- No crackles -- Equal breath sounds bilaterally -- Breathing non-labored at rest Cardiovascular:  -- S1, S2 present  -- No pericardial rubs  Breast: -- Post-surgical incisions all well-approximated and non-tender to palpation without any peri-incisional erythema or drainage -- No appreciable mass(es) or nipple discharge Musculoskeletal / Integumentary:  -- Wounds or skin discoloration: None appreciated except post-surgical incisions as described above (Breast) -- Extremities: B/L UE and LE FROM, hands and feet warm, no RUE lymphedema   Imaging: No new pertinent imaging available for review  Surgical Pathology (10/29/2018) A. BREAST, RIGHT, MASS (STATUS POST NEOADJUVANT THERAPY); EXCISION:  - NO RESIDUAL MALIGNANCY IDENTIFIED.  - PREVIOUS BIOPSY SITE RELATED CHANGES.  - CHANGES CONSISTENT WITH TUMOR REGRESSION/INVOLUTION.  - MARGINS NEGATIVE FOR TUMOR.   B. LYMPH NODE, RIGHT, SENTINEL #1,  BIOPSY:  - NO METASTATIC CARCINOMA IDENTIFIED.   C. BREAST, RIGHT, ADDITIONAL INFERIOR MARGIN; EXCISION:  - NEGATIVE FOR MALIGNANCY.  - CHANGES CONSISTENT WITH TUMOR REGRESSION/INVOLUTION, FOCAL.  - MARGINS NEGATIVE FOR TUMOR.   D. BREAST, RIGHT, ADDITIONAL DEEP MARGIN; EXCISION:  - NEGATIVE FOR MALIGNANCY.  - MARGINS NEGATIVE FOR TUMOR.   E. RIGHT AXILLA, CONTENTS, EXCISION:  - FIBROADIPOSE TISSUE WITH CALCIFICATIONS.  - ADJACENT LYMPH NODE (POSSIBLY 2 LYMPH NODES) WITH FIBROSIS, NEGATIVE  FOR TUMOR.  - SECOND LYMPH NODE WITH MINIMAL FIBROSIS, NEGATIVE FOR TUMOR.   Although no biopsy clip is identified in the excision specimen, a  previous biopsy site associated with tumor regression/involution is  identified.  The axillary contents show at least 2 lymph nodes and possibly 3  associated with fibrosis and calcifications. Some of this may represent  tumor regression/involution.  Based on the findings in the current surgical specimens the TNM stage is  ypT0, ypN0.  Assessment:  63 y.o. yo Female with a problem list including...  Patient Active Problem List   Diagnosis Date Noted  . Hypotension due to drugs 05/30/2018  . 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 Malignant neoplasm of lower-outer quadrant of right female breast ([place]) 05/08/2018  . Goals of care, counseling/discussion 05/08/2018  . Morbid obesity ([place]) 03/06/2018  . Allergy to peanuts 02/06/2018  . Hypertension 02/26/2016  . Thyroid activity decreased 02/26/2016  . Paroxysmal atrial fibrillation ([place]) 01/22/2016  . Primary osteoarthritis of right knee 09/22/2015  . Combined fat and carbohydrate induced hyperlipemia 04/02/2015  . Benign essential hypertension 04/02/2015  . Mitral insufficiency 02/20/2014    presents to clinic for post-op follow-up evaluation, doing well  2 weeks s/p Right breast lumpectomy and excision of deep axillary sentinel lymph nodes Rosana Hoes, 10/29/2018) following  neoadjuvant chemotherapy.  Plan:              - surgical pathology results discussed with patient              - okay to submerge incisions under water (baths, swimming) prn             - gradually resume all activities without restrictions over next 2 weeks             - apply sunblock particularly to incisions with sun exposure to reduce pigmentation of scars  - will remove port in office when no longer indicated per medical oncology             - return to clinic following diagnostic mammogram in 6 months  - instructed to call office if any questions or concerns  All of the above recommendations were discussed with the patient, and all of patient's questions were answered to her expressed satisfaction.  -- Marilynne Drivers Rosana Hoes, MD, [place]: [facility] - Partnering for exceptional care. Office: [phone]

## 2018-11-12 ENCOUNTER — Inpatient Hospital Stay: Payer: PRIVATE HEALTH INSURANCE | Attending: Oncology | Admitting: Oncology

## 2018-11-12 ENCOUNTER — Encounter: Payer: Self-pay | Admitting: Oncology

## 2018-11-12 ENCOUNTER — Other Ambulatory Visit: Payer: Self-pay

## 2018-11-12 VITALS — BP 159/82 | HR 62 | Temp 96.7°F | Resp 18 | Wt 303.1 lb

## 2018-11-12 DIAGNOSIS — Z171 Estrogen receptor negative status [ER-]: Secondary | ICD-10-CM

## 2018-11-12 DIAGNOSIS — Z803 Family history of malignant neoplasm of breast: Secondary | ICD-10-CM | POA: Insufficient documentation

## 2018-11-12 DIAGNOSIS — N189 Chronic kidney disease, unspecified: Secondary | ICD-10-CM | POA: Insufficient documentation

## 2018-11-12 DIAGNOSIS — I4891 Unspecified atrial fibrillation: Secondary | ICD-10-CM | POA: Diagnosis not present

## 2018-11-12 DIAGNOSIS — I10 Essential (primary) hypertension: Secondary | ICD-10-CM | POA: Diagnosis not present

## 2018-11-12 DIAGNOSIS — C50919 Malignant neoplasm of unspecified site of unspecified female breast: Secondary | ICD-10-CM

## 2018-11-12 DIAGNOSIS — C50511 Malignant neoplasm of lower-outer quadrant of right female breast: Secondary | ICD-10-CM | POA: Diagnosis not present

## 2018-11-12 DIAGNOSIS — Z95828 Presence of other vascular implants and grafts: Secondary | ICD-10-CM

## 2018-11-12 DIAGNOSIS — Z452 Encounter for adjustment and management of vascular access device: Secondary | ICD-10-CM | POA: Insufficient documentation

## 2018-11-12 NOTE — Progress Notes (Signed)
Patient here for follow up after surgery. No concerns voiced.

## 2018-11-13 ENCOUNTER — Other Ambulatory Visit: Payer: Self-pay

## 2018-11-13 ENCOUNTER — Encounter: Payer: Self-pay | Admitting: Family Medicine

## 2018-11-13 ENCOUNTER — Ambulatory Visit (INDEPENDENT_AMBULATORY_CARE_PROVIDER_SITE_OTHER): Payer: PRIVATE HEALTH INSURANCE | Admitting: Family Medicine

## 2018-11-13 VITALS — BP 161/85 | HR 71 | Temp 98.0°F | Ht 63.0 in | Wt 305.2 lb

## 2018-11-13 DIAGNOSIS — I10 Essential (primary) hypertension: Secondary | ICD-10-CM | POA: Diagnosis not present

## 2018-11-13 DIAGNOSIS — E039 Hypothyroidism, unspecified: Secondary | ICD-10-CM

## 2018-11-13 DIAGNOSIS — I480 Paroxysmal atrial fibrillation: Secondary | ICD-10-CM | POA: Diagnosis not present

## 2018-11-13 DIAGNOSIS — Z171 Estrogen receptor negative status [ER-]: Secondary | ICD-10-CM

## 2018-11-13 DIAGNOSIS — E782 Mixed hyperlipidemia: Secondary | ICD-10-CM | POA: Diagnosis not present

## 2018-11-13 DIAGNOSIS — C50511 Malignant neoplasm of lower-outer quadrant of right female breast: Secondary | ICD-10-CM

## 2018-11-13 DIAGNOSIS — C50919 Malignant neoplasm of unspecified site of unspecified female breast: Secondary | ICD-10-CM

## 2018-11-13 MED ORDER — POTASSIUM CHLORIDE CRYS ER 20 MEQ PO TBCR: 20.0000 meq | EXTENDED_RELEASE_TABLET | Freq: Every day | ORAL | 1 refills | Status: DC

## 2018-11-13 MED ORDER — METOPROLOL SUCCINATE ER 50 MG PO TB24: 50.0000 mg | ORAL_TABLET | Freq: Every day | ORAL | 1 refills | Status: DC

## 2018-11-13 MED ORDER — LISINOPRIL-HYDROCHLOROTHIAZIDE 20-25 MG PO TABS: 2.0000 | ORAL_TABLET | Freq: Every day | ORAL | 1 refills | Status: DC

## 2018-11-13 NOTE — Assessment & Plan Note (Signed)
Just had surgery. Healing well. Continue to follow with oncology and surgery.

## 2018-11-13 NOTE — Assessment & Plan Note (Deleted)
Running high today, but only taking 1 of her lisinopril-HCTZ. Restart 2 daily and recheck at follow up. Continue to monitor. Refills given today. Call with any concerns.

## 2018-11-13 NOTE — Assessment & Plan Note (Signed)
In sinus rhythm today. Continue to follow with cardiology. Call with any concerns.

## 2018-11-13 NOTE — Assessment & Plan Note (Signed)
Weight stable dueing chemo. Continue to work on diet and exercise. Call with any concerns.

## 2018-11-13 NOTE — Assessment & Plan Note (Signed)
Rechecking levels today. Await results. Call with any concerns.  

## 2018-11-13 NOTE — Progress Notes (Signed)
BP (!) 161/85   Pulse 71   Temp 98 F (36.7 C) (Oral)   Ht 5\' 3"  (1.6 m)   Wt (!) 305 lb 3 oz (138.4 kg)   LMP 03/17/2009 (Approximate)   SpO2 99%   BMI 54.06 kg/m    Subjective:    Patient ID: Wendy Marsh, female    DOB: Mar 16, 1956, 63 y.o.   MRN: [id number]  HPI: Wendy Marsh is a 63 y.o. female  Chief Complaint  Patient presents with  . Follow-up  . Hypertension  . Atrial Fibrillation   HYPERTENSION / HYPERLIPIDEMIA Satisfied with current treatment? yes Duration of hypertension: chronic BP monitoring frequency: not checking BP range: 110s/60s BP medication side effects: no Past BP meds: metoprolol, lisinopril-hctz Duration of hyperlipidemia: chronic Cholesterol medication side effects: no Cholesterol supplements: none Past cholesterol medications: none Medication compliance: excellent compliance Aspirin: no Recent stressors: yes Recurrent headaches: no Visual changes: no Palpitations: no Dyspnea: no Chest pain: no Lower extremity edema: no Dizzy/lightheaded: no  Relevant past medical, surgical, family and social history reviewed and updated as indicated. Interim medical history since our last visit reviewed. Allergies and medications reviewed and updated.  Review of Systems  Constitutional: Negative.   Respiratory: Negative.   Cardiovascular: Negative.   Neurological: Negative.   Psychiatric/Behavioral: Negative.     Per HPI unless specifically indicated above     Objective:    BP (!) 161/85   Pulse 71   Temp 98 F (36.7 C) (Oral)   Ht 5\' 3"  (1.6 m)   Wt (!) 305 lb 3 oz (138.4 kg)   LMP 03/17/2009 (Approximate)   SpO2 99%   BMI 54.06 kg/m   Wt Readings from Last 3 Encounters:  11/13/18 (!) 305 lb 3 oz (138.4 kg)  11/12/18 (!) 303 lb 1.6 oz (137.5 kg)  11/08/18 (!) 301 lb (136.5 kg)    Physical Exam Vitals signs and nursing note reviewed.  Constitutional:      General: She is not in acute distress.    Appearance: Normal  appearance. She is not ill-appearing, toxic-appearing or diaphoretic.  HENT:     Head: Normocephalic and atraumatic.     Right Ear: External ear normal.     Left Ear: External ear normal.     Nose: Nose normal.     Mouth/Throat:     Mouth: Mucous membranes are moist.     Pharynx: Oropharynx is clear.  Eyes:     General: No scleral icterus.       Right eye: No discharge.        Left eye: No discharge.     Extraocular Movements: Extraocular movements intact.     Conjunctiva/sclera: Conjunctivae normal.     Pupils: Pupils are equal, round, and reactive to light.  Neck:     Musculoskeletal: Normal range of motion and neck supple.  Cardiovascular:     Rate and Rhythm: Normal rate and regular rhythm.     Pulses: Normal pulses.     Heart sounds: Normal heart sounds. No murmur. No friction rub. No gallop.   Pulmonary:     Effort: Pulmonary effort is normal. No respiratory distress.     Breath sounds: Normal breath sounds. No stridor. No wheezing, rhonchi or rales.  Chest:     Chest wall: No tenderness.  Musculoskeletal: Normal range of motion.  Skin:    General: Skin is warm and dry.     Capillary Refill: Capillary refill takes less than 2 seconds.  Coloration: Skin is not jaundiced or pale.     Findings: No bruising, erythema, lesion or rash.  Neurological:     General: No focal deficit present.     Mental Status: She is alert and oriented to person, place, and time. Mental status is at baseline.  Psychiatric:        Mood and Affect: Mood normal.        Behavior: Behavior normal.        Thought Content: Thought content normal.        Judgment: Judgment normal.     Results for orders placed or performed during the hospital encounter of 04/02/15  Basic metabolic panel  Result Value Ref Range   Sodium 140 135 - 145 mmol/L   Potassium 3.4 (L) 3.5 - 5.1 mmol/L   Chloride 101 98 - 111 mmol/L   CO2 30 22 - 32 mmol/L   Glucose, Bld 92 70 - 99 mg/dL   BUN 13 8 - 23 mg/dL    Creatinine, Ser 0.78 0.44 - 1.00 mg/dL   Calcium 9.2 8.9 - 10.3 mg/dL   GFR calc non Af Amer >60 >60 mL/min   GFR calc Af Amer >60 >60 mL/min   Anion gap 9 5 - 15  Surgical pathology  Result Value Ref Range   SURGICAL PATHOLOGY      Surgical Pathology CASE: [id number] PATIENT: Wendy Marsh Surgical Pathology Report     SPECIMEN SUBMITTED: A. Breast, right lumpectomy B. Breast, right, sentinel lymph node #1 1100 C. Breast, right inferior margin D. Breast, right deep margin E. Axillary mass, right  CLINICAL HISTORY: None provided  PRE-OPERATIVE DIAGNOSIS: Right breast cancer  POST-OPERATIVE DIAGNOSIS: Same as pre op     DIAGNOSIS: A. BREAST, RIGHT, MASS (STATUS POST NEOADJUVANT THERAPY); EXCISION: - NO RESIDUAL MALIGNANCY IDENTIFIED. - PREVIOUS BIOPSY SITE RELATED CHANGES. - CHANGES CONSISTENT WITH TUMOR REGRESSION/INVOLUTION. - MARGINS NEGATIVE FOR TUMOR.  B. LYMPH NODE, RIGHT, SENTINEL #1, BIOPSY: - NO METASTATIC CARCINOMA IDENTIFIED.  C. BREAST, RIGHT, ADDITIONAL INFERIOR MARGIN; EXCISION: - NEGATIVE FOR MALIGNANCY. - CHANGES CONSISTENT WITH TUMOR REGRESSION/INVOLUTION, FOCAL. - MARGINS NEGATIVE FOR TUMOR.  D. BREAST, RIGHT, ADDITIONAL DEEP MARGIN; EXCISION: - NEG ATIVE FOR MALIGNANCY. - MARGINS NEGATIVE FOR TUMOR.  E. RIGHT AXILLA, CONTENTS, EXCISION: - FIBROADIPOSE TISSUE WITH CALCIFICATIONS. - ADJACENT LYMPH NODE (POSSIBLY 2 LYMPH NODES) WITH FIBROSIS, NEGATIVE FOR TUMOR. - SECOND LYMPH NODE WITH MINIMAL FIBROSIS, NEGATIVE FOR TUMOR.  Comment Although no biopsy clip is identified in the excision specimen, a previous biopsy site associated with tumor regression/involution is identified. The axillary contents show at least 2 lymph nodes and possibly 3 associated with fibrosis and calcifications.  Some of this may represent tumor regression/involution. Based on the findings in the current surgical specimens the TNM stage is ypT0,  ypN0.   GROSS DESCRIPTION: A. Intraoperative Consultation:     Labeled: Right breast lumpectomy     Received: Fresh     Specimen: Breast lumpectomy with needle localization     Pathologic evaluation performed: Gross margin evaluation     Diagnosis: Gross consult: Tip of needle is protruding at  inferior margin (green).  No biopsy clip seen.     Communicated to: Dr. Rosana Hoes at 3:15 PM on 10/29/2018 by E. Rodney Cruise, M.D.     Tissue submitted: Not applicable  A. Labeled: Right breast lumpectomy Received: Fresh Accompanying specimen radiograph: Yes Radiographic findings: Needle localization wire present.  No biopsy clip grossly identified. Time in fixative: Collected at 2:42 PM  and placed into formalin at 3:15 PM on 10/29/2018  Cold ischemic time: Less than 1 hour Total fixation time: 27 hours Type of procedure: Breast lumpectomy with needle localization Location / laterality of specimen: Right Orientation of specimen: There is a short suture designating superior, a long suture designating lateral, and a double suture designating deep. Inking: Superior = blue Inferior = green Medial = yellow Lateral = orange Posterior = black Anterior/Superficial = red Size of specimen: 10.5 (superior-inferior) x 8.5 (medial-lateral) x 3.7 cm (anterior-posterior) Skin: Absen t Biopsy site: No distinct biopsy site or biopsy clip is grossly identified. Number of discrete masses: No distinct mass lesion is grossly identified. Size of irregular fibrous tissue: 2.8 x 2.5 x 1.7 cm Description of irregular fibrous tissue: The needle localization wire inserts anteriorly.  At the time of intraoperative consultation, the wire tip is seen protruding at the inferior margin.  Directly surrounding the needle localization wire is a focal, ill-defined area of pale-tan, slightly irregular fibrous tissue (suspicious for possible tumor bed).  No distinct mass lesion or biopsy clip is grossly identified. Distance  between masses/clips: Not applicable Margins: Superior - 5.5 cm, inferior - less than 0.1 cm, anterior - less than 0.1 cm, posterior - 0.7 cm, medial - 5.5 cm, and lateral - 3.5 cm. Description of remainder of tissue: Sectioning the remainder of the specimen displays tan-yellow, lobulated, otherwise grossly unremarkable fibroadipose tissue with a fi brous to adipose ratio of 5:95.  No additional abnormalities or mass lesions are grossly identified.  Block summary: 1-14 - entire area of irregular fibrous tissue surrounding needle localization wire in relation to anterior, inferior, and posterior resection margins (localization wire insertion site in cassette 7; localization wire tip site in cassette 10) 15 - superior resection margin closest to irregular fibrous tissue 16 - medial resection margin closest to irregular fibrous tissue 17 - lateral resection margin closest to irregular fibrous tissue  B. Labeled: Right breast sentinel lymph node 11:00 (per requisition, #1) Received: Formalin Tissue fragment(s): 1 Size: 2.7 x 2.0 x 1.0 cm Description: Received is an irregular fragment of tan-yellow adipose tissue.  Palpation reveals 1 lymph node candidate measuring 1.3 cm in greatest dimension. The lymph node is trisected and entirely submitted in cassette 1.  C. Labeled: Right breast inferior margin Received: Fre sh and placed into formalin Accompanying specimen radiograph: Yes Radiographic findings: Needle localization wire and biopsy clip are absent. Time in fixative: Collected at 3:51 PM and placed into formalin at 4:25 PM on 10/29/2018  Cold ischemic time: Less than 1 hour Total fixation time: 26 hours Type of procedure: Unoriented breast lumpectomy Location / laterality of specimen: Inferior margin of right breast Orientation of specimen: The specimen is received unoriented with no orientating material present. Inking: The surgical resection margin is inked blue. Size of  specimen: 5.5 x 4.8 x 1.6 cm Skin: Absent Description of tissue: Sectioning displays tan-yellow, lobulated, grossly unremarkable fibroadipose tissue with a fibrous to adipose ratio of 5:95.  No biopsy site, abnormalities, or mass lesion is grossly identified.  Representative sections are submitted in cassettes 1-5.  D. Labeled: Right breast deep margin Received: Fresh and placed into formalin Accomp anying specimen radiograph: Yes Radiographic findings: Needle localization wire and biopsy clip are absent. Time in fixative: Collected at 3:56 PM and placed into formalin at 4:25 PM on 10/29/2018  Cold ischemic time: Less than 1 hour Total fixation time: 26 hours Type of procedure: Unoriented breast lumpectomy Location / laterality of specimen: Deep  margin of right breast Orientation of specimen: The specimen is received unoriented with no orientating material present. Inking: The surgical resection margin is inked blue. Size of specimen: 5.5 x 2.7 x 0.6 cm Skin: Absent Description of tissue: Sectioning displays tan-yellow, lobulated, grossly unremarkable fibroadipose tissue with a fibrous to adipose ratio of 5:95.  No biopsy site, abnormalities, or mass lesion is grossly identified.  Representative sections are submitted in cassettes 1-3.  E. Labeled: Right axillary mass Received: Formalin Tissue fragment(s): 2 Size: 0.6 x 0.5 x 0.3 cm and 3.5 x 1.7 x 0.7 cm  Description: Received are irregular fragments of tan-yellow soft tissue. The surgical resection margin of the smaller fragment is inked black, and the surgical resection margin of the larger fragment is inked blue. Sectioning the smaller fragment displays a tan, homogeneous cut surface (possible lymph node).  Sectioning the larger fragment displays an irregular cut surface consisting of tan, solid soft tissue admixed with a focal area of calcification (1.5 cm in greatest dimension).  No additional abnormalities are  grossly identified.  The specimen is submitted entirely as follows: 1 - entire, bisected smaller fragment 2-4 - entire, serially sectioned larger fragment     Final Diagnosis performed by Raynelle Bring, MD.   Electronically signed 10/31/2018 2:38:57PM The electronic signature indicates that the named Attending Pathologist has evaluated the specimen  Technical component performed at [facility], [address] [place], [place] [address] Lab: [phone] Dir: Alyson Reedy, MD, MMM  Professional component performed at [facility], [facility], [place], [place], [place] [address] Lab: [phone] Dir: Dellia Nims. Rubinas, MD       Assessment & Plan:   Problem List Items Addressed This Visit      Cardiovascular and Mediastinum   Paroxysmal atrial fibrillation (HCC)    In sinus rhythm today. Continue to follow with cardiology. Call with any concerns.       Relevant Medications   metoprolol succinate (TOPROL-XL) 50 MG 24 hr tablet   lisinopril-hydrochlorothiazide (PRINZIDE,ZESTORETIC) 20-25 MG tablet   Benign essential hypertension - Primary    Running high today, but only taking 1 of her lisinopril-HCTZ. Restart 2 daily and recheck at follow up. Continue to monitor. Refills given today. Call with any concerns.       Relevant Medications   metoprolol succinate (TOPROL-XL) 50 MG 24 hr tablet   lisinopril-hydrochlorothiazide (PRINZIDE,ZESTORETIC) 20-25 MG tablet   Other Relevant Orders   Comprehensive metabolic panel   RESOLVED: Hypertension   Relevant Medications   metoprolol succinate (TOPROL-XL) 50 MG 24 hr tablet   lisinopril-hydrochlorothiazide (PRINZIDE,ZESTORETIC) 20-25 MG tablet     Endocrine   Thyroid activity decreased    Has not been taking her thyroid medicine while on chemo. Feeling OK. Will restart and recheck after she has restarted. Call with any concerns.       Relevant Medications   metoprolol succinate (TOPROL-XL) 50 MG 24 hr tablet      Other   Combined fat and carbohydrate induced hyperlipemia    Rechecking levels today. Await results. Call with any concerns.       Relevant Medications   metoprolol succinate (TOPROL-XL) 50 MG 24 hr tablet   lisinopril-hydrochlorothiazide (PRINZIDE,ZESTORETIC) 20-25 MG tablet   Other Relevant Orders   Comprehensive metabolic panel   Lipid Panel w/o Chol/HDL Ratio   Morbid obesity (HCC)    Weight stable dueing chemo. Continue to work on diet and exercise. Call with any concerns.       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 Healing well. Continue to follow with oncology and surgery.      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 Healing well. Continue to follow with oncology and surgery.          Follow up plan: Return in about 6 months (around 05/14/2019) for Physical.

## 2018-11-13 NOTE — Assessment & Plan Note (Signed)
Running high today, but only taking 1 of her lisinopril-HCTZ. Restart 2 daily and recheck at follow up. Continue to monitor. Refills given today. Call with any concerns.

## 2018-11-13 NOTE — Assessment & Plan Note (Signed)
Has not been taking her thyroid medicine while on chemo. Feeling OK. Will restart and recheck after she has restarted. Call with any concerns.

## 2018-11-14 ENCOUNTER — Inpatient Hospital Stay: Payer: PRIVATE HEALTH INSURANCE

## 2018-11-14 DIAGNOSIS — C50511 Malignant neoplasm of lower-outer quadrant of right female breast: Secondary | ICD-10-CM | POA: Diagnosis not present

## 2018-11-14 DIAGNOSIS — Z95828 Presence of other vascular implants and grafts: Secondary | ICD-10-CM

## 2018-11-14 LAB — COMPREHENSIVE METABOLIC PANEL
ALT: 14 IU/L (ref 0–32)
AST: 13 IU/L (ref 0–40)
Albumin/Globulin Ratio: 1.2 (ref 1.2–2.2)
Albumin: 3.6 g/dL — ABNORMAL LOW (ref 3.8–4.8)
Alkaline Phosphatase: 77 IU/L (ref 39–117)
BUN/Creatinine Ratio: 17 (ref 12–28)
BUN: 14 mg/dL (ref 8–27)
Bilirubin Total: <0.2 mg/dL (ref 0.0–1.2)
CALCIUM: 9.1 mg/dL (ref 8.7–10.3)
CO2: 26 mmol/L (ref 20–29)
Chloride: 98 mmol/L (ref 96–106)
Creatinine, Ser: 0.84 mg/dL (ref 0.57–1.00)
GFR calc Af Amer: 86 mL/min/{1.73_m2} (ref >59)
GFR calc non Af Amer: 74 mL/min/{1.73_m2} (ref >59)
Globulin, Total: 3 g/dL (ref 1.5–4.5)
Glucose: 87 mg/dL (ref 65–99)
POTASSIUM: 3.6 mmol/L (ref 3.5–5.2)
Sodium: 143 mmol/L (ref 134–144)
Total Protein: 6.6 g/dL (ref 6.0–8.5)

## 2018-11-14 LAB — LIPID PANEL W/O CHOL/HDL RATIO
Cholesterol, Total: 242 mg/dL — ABNORMAL HIGH (ref 100–199)
HDL: 54 mg/dL (ref >39)
LDL Calculated: 163 mg/dL — ABNORMAL HIGH (ref 0–99)
Triglycerides: 127 mg/dL (ref 0–149)
VLDL Cholesterol Cal: 25 mg/dL (ref 5–40)

## 2018-11-14 MED ORDER — SODIUM CHLORIDE 0.9% FLUSH
10.0000 mL | Freq: Once | INTRAVENOUS | Status: AC
  Administered 2018-11-14: 10 mL via INTRAVENOUS
  Filled 2018-11-14: qty 10

## 2018-11-14 MED ORDER — HEPARIN SOD (PORK) LOCK FLUSH 100 UNIT/ML IV SOLN
500.0000 [IU] | Freq: Once | INTRAVENOUS | Status: AC
  Administered 2018-11-14: 500 [IU] via INTRAVENOUS
  Filled 2018-11-14: qty 5

## 2018-11-14 NOTE — Progress Notes (Signed)
Hematology/Oncology Follow up note [facility] Telephone:(336) [phone] Fax:([phone]   Patient Care Team: Valerie Roys, DO as PCP - General (Family Medicine) Thornton Park, MD as Referring Physician (Orthopedic Surgery) Earlie Server, MD as Medical Oncologist (Medical Oncology)  REASON FOR VISIT Follow up for treatment of Breast cancer, assess of chemotherapy toxicity.  HISTORY OF PRESENTING ILLNESS:  Wendy Marsh is a  63 y.o.  female with PMH listed below who was referred to me for evaluation of breast cancer Patient report feeling right breast mass for about 1 month. She mentioned to primary care provider and had diagnostic mammogram Mammogram showed right breast 6 o'clock axis breast mass, 2.9 cm corresponding to area of concern.  Target [place] was performed showed irregular hypoechoic mass in right breast Right axilla shows no enlarged or morphologically abnormal lymph nodes.  Breast mass was biopsied, and pathology showed invasive mammary carcinoma.  Grade 3 , ER/PR- HER2 -.  LVI suspicious, favor present.   Nipple discharge: denies.  Breast Skin changes, denies Family history: sister has history of breast cancer, father deceased from esophageal cancer. ? M grandmother breast cancer.   History of radiation to chest: denies.  Previous breast surgery:   Patient presents with multiple family members including husband, three daughters, and son in Sports coach.  She walks with a walker. Chronic right knee pain due to osteoarthritis. Paroxymal A fib, on metoprolol for rate control.  Asprin 28m daily  #  baseline MUGA testing done and reviewed systolic function depression, LVEF 45%. Given that Adriamycin has cardiac toxicity, potentially can lead to irreversible cardiomyopathy, I discussed with patient about using non-anthracycline-containing regimen. Would recommend Docetaxel 738mm2 and Carboplatin every 3 weeks for 4-6 cycles. [ data from [facility]t al 2018 Clin Cancer Res, Pathological Response and Survival in Triple-Negative Breast Cancer Following Neoadjuvant Carboplatin plus Docetaxel. Stage I-III TNBC patients, Neoadjuvant carboplatin (AUC6) plus docetaxel (75 mg/m2) every 21 days  6 cycles. pCR was 55% and Residual cancer burden (RCB) were 13%. Excellent Three-year RFS was 90% in patients with pCR and 66% in those without pCR]   # # Family history of breast cancer plus personal history of triple negative breast cancer. Testing did not reveal a pathogenic mutation in any of the genes analyzed.  BRCA negative.  VUS was detected. Does not change management.   # During the interval patient was also symmetrical ask for evaluation of mildly decreased systolic function.  Reviewed Dr. KoHeywood Footmanote, no chemotherapy restriction.  At the lowest risk possible for cardiovascular complications.  Echocardiogram was repeated by Dr. KoJose Persiahowed normal EF of 55%.  Currently there is no evidence of active and or significant angina and or congestive heart failure. She was also recommended by cardiology to hold flecainide 2 to 3 days before and after chemo if Zofran will be used to reduce QT interval changes and interaction.  INTERVAL HISTORY Wendy BROTHERSs a [age].o. female who has above history reviewed by me today presents for discussion of pathology and further management plan for triple negative breast cancer. Patient status post 6 cycles of TC. 10/29/2018 underwent right breast lumpectomy with  excisional biopsy of the right axillary deep sentinel lymph nodes. Pathology showed no residual malignancy identified.  Previous biopsy site related changes.  Changes consistent with the tumor regression/evaluation.  Margins negative for tumor.  No metastatic carcinoma identified in sentinel lymph node. Right axilla contents excision showed friable adipose tissue with calcifications.  Adjacent lymph nodes with fibrosis, negative for  tumor.  Second lymph node with minimal fibrosis.  Negative for tumor.ypT0, ypN0  Patient reports doing well since surgery.  Patient denies any peri-incision pain, mass or tenderness. Chronic fatigue has improved.  Chronic arthralgia no changes.  Review of Systems  Constitutional: Negative for chills, fever, malaise/fatigue and weight loss.  HENT: Negative for nosebleeds and sore throat.   Eyes: Negative for double vision, photophobia and redness.  Respiratory: Negative for cough, shortness of breath and wheezing.   Cardiovascular: Negative for chest pain, palpitations and orthopnea.  Gastrointestinal: Negative for abdominal pain, blood in stool, nausea and vomiting.  Genitourinary: Negative for dysuria.  Musculoskeletal: Positive for joint pain. Negative for back pain, myalgias and neck pain.       Chronic right knee pain.  Skin: Negative for itching and rash.  Neurological: Negative for dizziness, tingling and tremors.  Endo/Heme/Allergies: Negative for environmental allergies. Does not bruise/bleed easily.  Psychiatric/Behavioral: Negative for depression.    MEDICAL HISTORY:  Past Medical History:  Diagnosis Date  . Arthritis   . Atrial fibrillation ([place])   . Dysrhythmia    afib  . Hyperlipidemia   . Hypertension   . Hypothyroidism   . Lumbago   . Osteoporosis   . Thyroid disease     SURGICAL HISTORY: Past Surgical History:  Procedure Laterality Date  . BREAST BIOPSY Right 05/02/2018   Invasive mammary carcinoma, grade 3, neo adj chemo  . BREAST LUMPECTOMY Right 10/29/2018   path pending  . BREAST LUMPECTOMY WITH NEEDLE LOCALIZATION AND AXILLARY SENTINEL LYMPH NODE BX Right 10/29/2018   Procedure: BREAST LUMPECTOMY WITH NEEDLE LOCALIZATION AND SENTINEL LYMPH NODE BX;  Surgeon: Vickie Epley, MD;  Location: [facility] ORS;  Service: General;  Laterality: Right;  . JOINT REPLACEMENT Left    tkr  . PORTACATH PLACEMENT Right 05/11/2018   Procedure: INSERTION  I.J PORT-A-CATH;   Surgeon: Robert Bellow, MD;  Location: [facility] ORS;  Service: General;  Laterality: Right;  . REPLACEMENT TOTAL KNEE      SOCIAL HISTORY: Social History   Socioeconomic History  . Marital status: Married    Spouse name: jerry  . Number of children: 3  . Years of education: Not on file  . Highest education level: Some college, no degree  Occupational History  . Not on file  Social Needs  . Financial resource strain: Somewhat hard  . Food insecurity:    Worry: Never true    Inability: Never true  . Transportation needs:    Medical: No    Non-medical: No  Tobacco Use  . Smoking status: Never Smoker  . Smokeless tobacco: Never Used  Substance and Sexual Activity  . Alcohol use: No  . Drug use: No  . Sexual activity: Yes    Birth control/protection: Post-menopausal  Lifestyle  . Physical activity:    Days per week: 0 days    Minutes per session: 0 min  . Stress: Only a little  Relationships  . Social connections:    Talks on phone: More than three times a week    Gets together: Twice a week    Attends religious service: Never    Active member of club or organization: No    Attends meetings of clubs or organizations: Not on file    Relationship status: Married  . Intimate partner violence:    Fear of current or ex partner: No    Emotionally abused: No    Physically abused:  No    Forced sexual activity: No  Other Topics Concern  . Not on file  Social History Narrative  . Not on file    FAMILY HISTORY: Family History  Problem Relation Age of Onset  . Diabetes Mother   . Congestive Heart Failure Mother   . Breast cancer Maternal Grandmother        dx 41s; deceased 25s  . Cancer Maternal Uncle 41       deceased 18s  . Other Father        No information on father or paternal relatives  . Breast cancer Other 38       paternal half-sister; also esophageal ca at 28; currently 15  . Diabetes Other   . Hypertension Other   . Kidney disease Other   . Thyroid  disease Other     ALLERGIES:  is allergic to gabapentin; morphine and related; peanut-containing drug products; and tape.  MEDICATIONS:  Current Outpatient Medications  Medication Sig Dispense Refill  . acetaminophen (TYLENOL) 500 MG tablet Take 500 mg by mouth every 6 (six) hours as needed.    Marland Kitchen EPINEPHrine 0.3 mg/0.3 mL IJ SOAJ injection Inject 0.3 mg into the muscle as needed for anaphylaxis.   12  . flecainide (TAMBOCOR) 50 MG tablet Take 50 mg by mouth 2 (two) times daily.     . naproxen sodium (ALEVE) 220 MG tablet Take 440 mg by mouth daily as needed (for pain or headache).    . Nutritional Supplements (JUICE PLUS FIBRE PO) Take 2-4 each by mouth daily.     Marland Kitchen lisinopril-hydrochlorothiazide (PRINZIDE,ZESTORETIC) 20-25 MG tablet Take 2 tablets by mouth daily. 180 tablet 1  . metoprolol succinate (TOPROL-XL) 50 MG 24 hr tablet Take 1 tablet (50 mg total) by mouth daily. Take with or immediately following a meal. 90 tablet 1  . potassium chloride SA (K-DUR,KLOR-CON) 20 MEQ tablet Take 1 tablet (20 mEq total) by mouth daily. 90 tablet 1   No current facility-administered medications for this visit.      PHYSICAL EXAMINATION: ECOG PERFORMANCE STATUS: 1 - Symptomatic but completely ambulatory Vitals:   11/12/18 1315  BP: (!) 159/82  Pulse: 62  Resp: 18  Temp: (!) 96.7 F (35.9 C)   Filed Weights   11/12/18 1315  Weight: (!) 303 lb 1.6 oz (137.5 kg)    Physical Exam Constitutional:      General: She is not in acute distress.    Comments: Obese, walks with a walker  HENT:     Head: Normocephalic and atraumatic.  Eyes:     General: No scleral icterus.    Pupils: Pupils are equal, round, and reactive to light.  Neck:     Musculoskeletal: Normal range of motion and neck supple.  Cardiovascular:     Rate and Rhythm: Normal rate and regular rhythm.     Heart sounds: Normal heart sounds.  Pulmonary:     Effort: Pulmonary effort is normal. No respiratory distress.      Breath sounds: No wheezing.  Abdominal:     General: Bowel sounds are normal. There is no distension.     Palpations: Abdomen is soft. There is no mass.     Tenderness: There is no abdominal tenderness.  Musculoskeletal: Normal range of motion.        General: No deformity.     Comments: Chronic lower extremity edema.  1+  Skin:    General: Skin is warm and dry.  Findings: No erythema or rash.  Neurological:     Mental Status: She is alert and oriented to person, place, and time.     Cranial Nerves: No cranial nerve deficit.     Coordination: Coordination normal.  Psychiatric:        Behavior: Behavior normal.        Thought Content: Thought content normal.   Breast exam deferred as patient was just examined by Dr. Rosana Hoes on 11/08/2018.   LABORATORY DATA:  I have reviewed the data as listed Lab Results  Component Value Date   WBC 6.0 10/26/2018   HGB 10.4 (L) 10/26/2018   HCT 33.6 (L) 10/26/2018   MCV 100.9 (H) 10/26/2018   PLT 286 10/26/2018   Recent Labs    08/28/18 1001 09/11/18 0808 10/26/18 1224 10/29/18 1015 11/13/18 1050  NA 138 140 139 140 143  K 3.6 3.3* 3.3* 3.4* 3.6  CL 99 103 99 101 98  CO2 _0 GLUCOSE 93 104* 85 92 87  BUN _1 CREATININE 0.84 0.72 0.81 0.78 0.84  CALCIUM 9.0 9.0 8.9 9.2 9.1  GFRNONAA >60 >60 >60 >60 74  GFRAA >60 >60 >60 >60 86  PROT 6.9 6.8  --   --  6.6  ALBUMIN 3.7 3.5  --   --  3.6*  AST 27 20  --   --  13  ALT 22 20  --   --  14  ALKPHOS 90 74  --   --  77  BILITOT 0.7 0.6  --   --  <0.2   RADIOGRAPHIC STUDIES: I have personally reviewed the radiological images as listed and agreed with the findings in the report. NM Cardiac Muga rest: Calculated LEFT ventricular ejection fraction equals 45%   ASSESSMENT & PLAN:  1. 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[place])   2. Malignant neoplasm of lower-outer quadrant of right female breast, unspecified estrogen receptor status ([place])   3. Port-A-Cath  in place   Cancer Staging Malignant neoplasm of lower-outer quadrant of right female breast [facility]) Staging form: Breast, AJCC 8th Edition - Clinical stage from 05/08/2018: Stage IIB (cT2, cN0, cM0, G3, ER-, PR-, HER2-) - Signed by Earlie Server, MD on 05/08/2018 - Pathologic stage from 11/14/2018: No Stage Recommended (ypT0, pN0, cM0, ER-, PR-, HER2-) - Signed by Earlie Server, MD on 11/14/2018  #Triple negative breast cancer,  Status post neoadjuvant chemotherapy with 6 cycles of TC.  Status post right lumpectomy with excisional biopsy of sentinel lymph node.  No residual tumor.  ypT0 ypN0 Pathology report was discussed with patient in details. No adjuvant chemotherapy or immunotherapy will be offered at this point. Recommend patient to establish care with radiation oncology for adjuvant radiation.  #Surveillance: Recommend history and physical every 3-6 months for the first 5 years then annually.  Recommend mammography every 12 months, left side [nondiseased] is due to have mammogram in July 2020.  Will obtain at next visit.  #Port-A-Cath in place, recommend patient to keep for 1 to 2 years given high recurrence rate during the first 2 years after treatments in triple negative breast cancer.  She voices understanding.  Recommend port flush every 6 weeks  Labs are reviewed and discussed with patient. Hemoglobin on 10/26/2018 was 10.4, improved from her level during chemotherapy treatments.  Return of visit: 4 months with repeat CBC and CMP, breast cancer tumor markers.. We spent sufficient time to discuss many aspect of care,  questions were answered to patient's satisfaction. Total face to face encounter time for this patient visit was 25 min. >50% of the time was  spent in counseling and coordination of care.   Earlie Server, MD, PhD Hematology Oncology [facility] at [facility] Pager- [phone] 11/14/2018

## 2018-11-22 ENCOUNTER — Other Ambulatory Visit: Payer: Self-pay

## 2018-11-22 ENCOUNTER — Ambulatory Visit
Admission: RE | Admit: 2018-11-22 | Discharge: 2018-11-22 | Disposition: A | Discharge status: Home / Self Care | Payer: PRIVATE HEALTH INSURANCE | Source: Ambulatory Visit | Attending: Radiation Oncology | Admitting: Radiation Oncology

## 2018-11-22 ENCOUNTER — Encounter: Payer: Self-pay | Admitting: Radiation Oncology

## 2018-11-22 VITALS — BP 160/80 | HR 79 | Temp 96.4°F | Resp 18 | Wt 302.1 lb

## 2018-11-22 DIAGNOSIS — M810 Age-related osteoporosis without current pathological fracture: Secondary | ICD-10-CM | POA: Diagnosis not present

## 2018-11-22 DIAGNOSIS — I10 Essential (primary) hypertension: Secondary | ICD-10-CM | POA: Diagnosis not present

## 2018-11-22 DIAGNOSIS — M129 Arthropathy, unspecified: Secondary | ICD-10-CM | POA: Insufficient documentation

## 2018-11-22 DIAGNOSIS — Z79899 Other long term (current) drug therapy: Secondary | ICD-10-CM | POA: Insufficient documentation

## 2018-11-22 DIAGNOSIS — E669 Obesity, unspecified: Secondary | ICD-10-CM | POA: Diagnosis not present

## 2018-11-22 DIAGNOSIS — Z803 Family history of malignant neoplasm of breast: Secondary | ICD-10-CM | POA: Insufficient documentation

## 2018-11-22 DIAGNOSIS — E785 Hyperlipidemia, unspecified: Secondary | ICD-10-CM | POA: Diagnosis not present

## 2018-11-22 DIAGNOSIS — I4891 Unspecified atrial fibrillation: Secondary | ICD-10-CM | POA: Diagnosis not present

## 2018-11-22 DIAGNOSIS — C50511 Malignant neoplasm of lower-outer quadrant of right female breast: Secondary | ICD-10-CM | POA: Diagnosis not present

## 2018-11-22 DIAGNOSIS — E039 Hypothyroidism, unspecified: Secondary | ICD-10-CM | POA: Insufficient documentation

## 2018-11-22 DIAGNOSIS — Z170 Estrogen receptor positive status [ER+]: Secondary | ICD-10-CM | POA: Diagnosis not present

## 2018-11-22 DIAGNOSIS — C50919 Malignant neoplasm of unspecified site of unspecified female breast: Secondary | ICD-10-CM

## 2018-11-22 NOTE — Consult Note (Signed)
NEW PATIENT EVALUATION  Name: Wendy Marsh  MRN: [id number]  Date:   11/22/2018     DOB: [birth year]   This 63 y.o. female patient presents to the clinic for initial evaluation of triple negative stage IIb (T2 N0 M0) triple negative invasive mammary carcinoma of the right breast status post neoadjuvant chemotherapy with complete response then wide local excision and sentinel node biopsy.  REFERRING PHYSICIAN: Earlie Server, MD  CHIEF COMPLAINT:  Chief Complaint  Patient presents with  . Breast Cancer    Initial consultation    DIAGNOSIS: The encounter diagnosis was 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[place]).   PREVIOUS INVESTIGATIONS:  athology reports reviewed Mammograms and ultrasound reviewed Clinical notes reviewed  HPI: patient is a 63 year old female who originally presented with a self discovered mass in the right breast.mammogram and ultrasound confirmed a mass in the lower central portion of the right breast approximately 2.3 cm in greatest greatest dimension. Axilla was unremarkable with ultrasound.ultrasound-guided biopsy was positive for triple negative invasive mammary carcinoma.patient underwent 6 cycles of TC chemotherapy which she tolerated well. She then underwent a wide local excision and sentinel node biopsy. No residual malignancy was identified. The were changes related to her biopsy site. Sentinel lymph node was negative for metastatic disease. There was friable adipose tissue with calcifications. Patienthas done well postoperatively is now referred to radiation oncology for opinion.patient has multiple comorbidities including morbid obesity atrial fibrillation hyperlipidemia hypertension and osteoporosis. She specifically denies breast tenderness cough or bone pain.  PLANNED TREATMENT REGIMEN: attempt at accelerated partial breast irradiation with with external beam treatment.  PAST MEDICAL HISTORY:  has a past medical history of Arthritis, Atrial  fibrillation ([place]), Dysrhythmia, Hyperlipidemia, Hypertension, Hypothyroidism, Lumbago, Osteoporosis, and Thyroid disease.    PAST SURGICAL HISTORY:  Past Surgical History:  Procedure Laterality Date  . BREAST BIOPSY Right 05/02/2018   Invasive mammary carcinoma, grade 3, neo adj chemo  . BREAST LUMPECTOMY Right 10/29/2018   path pending  . BREAST LUMPECTOMY WITH NEEDLE LOCALIZATION AND AXILLARY SENTINEL LYMPH NODE BX Right 10/29/2018   Procedure: BREAST LUMPECTOMY WITH NEEDLE LOCALIZATION AND SENTINEL LYMPH NODE BX;  Surgeon: Vickie Epley, MD;  Location: [facility] ORS;  Service: General;  Laterality: Right;  . JOINT REPLACEMENT Left    tkr  . PORTACATH PLACEMENT Right 05/11/2018   Procedure: INSERTION  I.J PORT-A-CATH;  Surgeon: Robert Bellow, MD;  Location: [facility] ORS;  Service: General;  Laterality: Right;  . REPLACEMENT TOTAL KNEE      FAMILY HISTORY: family history includes Breast cancer in her maternal grandmother; Breast cancer (age of onset: 79) in an other family member; Cancer (age of onset: 79) in her maternal uncle; Congestive Heart Failure in her mother; Diabetes in her mother and another family member; Hypertension in an other family member; Kidney disease in an other family member; Other in her father; Thyroid disease in an other family member.  SOCIAL HISTORY:  reports that she has never smoked. She has never used smokeless tobacco. She reports that she does not drink alcohol or use drugs.  ALLERGIES: Gabapentin; Morphine and related; Peanut-containing drug products; and Tape  MEDICATIONS:  Current Outpatient Medications  Medication Sig Dispense Refill  . acetaminophen (TYLENOL) 500 MG tablet Take 500 mg by mouth every 6 (six) hours as needed.    Marland Kitchen EPINEPHrine 0.3 mg/0.3 mL IJ SOAJ injection Inject 0.3 mg into the muscle as needed for anaphylaxis.   12  . flecainide (TAMBOCOR) 50 MG tablet Take 50  mg by mouth 2 (two) times daily.     Marland Kitchen lisinopril-hydrochlorothiazide  (PRINZIDE,ZESTORETIC) 20-25 MG tablet Take 2 tablets by mouth daily. 180 tablet 1  . metoprolol succinate (TOPROL-XL) 50 MG 24 hr tablet Take 1 tablet (50 mg total) by mouth daily. Take with or immediately following a meal. 90 tablet 1  . naproxen sodium (ALEVE) 220 MG tablet Take 440 mg by mouth daily as needed (for pain or headache).    . Nutritional Supplements (JUICE PLUS FIBRE PO) Take 2-4 each by mouth daily.     . potassium chloride SA (K-DUR,KLOR-CON) 20 MEQ tablet Take 1 tablet (20 mEq total) by mouth daily. 90 tablet 1   No current facility-administered medications for this encounter.     ECOG PERFORMANCE STATUS:  0 - Asymptomatic  REVIEW OF SYSTEMS: patient has morbid obesity and multiple comorbidities as described above. Patient denies any weight loss, fatigue, weakness, fever, chills or night sweats. Patient denies any loss of vision, blurred vision. Patient denies any ringing  of the ears or hearing loss. No irregular heartbeat. Patient denies heart murmur or history of fainting. Patient denies any chest pain or pain radiating to her upper extremities. Patient denies any shortness of breath, difficulty breathing at night, cough or hemoptysis. Patient denies any swelling in the lower legs. Patient denies any nausea vomiting, vomiting of blood, or coffee ground material in the vomitus. Patient denies any stomach pain. Patient states has had normal bowel movements no significant constipation or diarrhea. Patient denies any dysuria, hematuria or significant nocturia. Patient denies any problems walking, swelling in the joints or loss of balance. Patient denies any skin changes, loss of hair or loss of weight. Patient denies any excessive worrying or anxiety or significant depression. Patient denies any problems with insomnia. Patient denies excessive thirst, polyuria, polydipsia. Patient denies any swollen glands, patient denies easy bruising or easy bleeding. Patient denies any recent  infections, allergies or URI. Patient "s visual fields have not changed significantly in recent time.    PHYSICAL EXAM: BP (!) 160/80 (BP Location: Left Wrist, Patient Position: Sitting)   Pulse 79   Temp (!) 96.4 F (35.8 C) (Tympanic)   Resp 18   Wt (!) 302 lb 2.3 oz (137.1 kg)   LMP 03/17/2009 (Approximate)   BMI 53.52 kg/m  Patient is morbidly obese uses a walker for amphotericin assistance.patient has markedly large and pendulous breasts. She status post wide local excision in the 6:00 position of the right breast incision is healing well. No dominant mass or nodularity is noted in either breast in 2 positions examined. No axillary or supraclavicular adenopathy is identified.Well-developed well-nourished patient in NAD. HEENT reveals PERLA, EOMI, discs not visualized.  Oral cavity is clear. No oral mucosal lesions are identified. Neck is clear without evidence of cervical or supraclavicular adenopathy. Lungs are clear to A&P. Cardiac examination is essentially unremarkable with regular rate and rhythm without murmur rub or thrill. Abdomen is benign with no organomegaly or masses noted. Motor sensory and DTR levels are equal and symmetric in the upper and lower extremities. Cranial nerves II through XII are grossly intact. Proprioception is intact. No peripheral adenopathy or edema is identified. No motor or sensory levels are noted. Crude visual fields are within normal range.  LABORATORY DATA: pathology reports reviewed    RADIOLOGY RESULTS:mammogram and ultrasound reviewed   IMPRESSION: original stage IIB invasive mammary carcinoma the right breast status post neoadjuvant chemotherapy with complete response at the time of wide local excision  and sentinel node biopsy in morbidly obese 62 year old female  PLAN: at this time I think whole breast reduction be extremely difficult with the patient with her ability to ambulate as well as her extremely large pendulous breasts. I would like to  attempt to target her seroma cavity in the right lower outer quadrant for accelerated external beam partial breast irradiation.would treat to 35 gray in 10 fractions at 3.5 gray per fraction twice a day. Risks and benefits of treatment including skin reaction fatigue alteration of blood counts all were discussed with the patient in detail. Should we not be able to target her seroma cavity we may have to go to whole breast radiation with additional significant side effects including significant chance of skin reaction. I have personally set up and ordered CT simulation for early next week. Patient comprehend my treatment plan well. Based on the triple negative nature of her disease she will not benefit from antiestrogen therapy. Patient copy has been treatment plan well.  I would like to take this opportunity to thank you for allowing me to participate in the care of your patient.Noreene Filbert, MD

## 2018-11-27 ENCOUNTER — Ambulatory Visit
Admission: RE | Admit: 2018-11-27 | Discharge: 2018-11-27 | Disposition: A | Discharge status: Home / Self Care | Payer: PRIVATE HEALTH INSURANCE | Source: Ambulatory Visit | Attending: Radiation Oncology | Admitting: Radiation Oncology

## 2018-11-27 DIAGNOSIS — C50511 Malignant neoplasm of lower-outer quadrant of right female breast: Secondary | ICD-10-CM | POA: Diagnosis not present

## 2018-11-27 DIAGNOSIS — Z510 Encounter for antineoplastic radiation therapy: Secondary | ICD-10-CM | POA: Diagnosis not present

## 2018-11-29 DIAGNOSIS — Z510 Encounter for antineoplastic radiation therapy: Secondary | ICD-10-CM | POA: Diagnosis not present

## 2018-12-04 ENCOUNTER — Ambulatory Visit: Payer: PRIVATE HEALTH INSURANCE

## 2018-12-05 ENCOUNTER — Ambulatory Visit: Payer: PRIVATE HEALTH INSURANCE

## 2018-12-06 ENCOUNTER — Ambulatory Visit
Admission: RE | Admit: 2018-12-06 | Discharge: 2018-12-06 | Disposition: A | Discharge status: Home / Self Care | Payer: PRIVATE HEALTH INSURANCE | Source: Ambulatory Visit | Attending: Radiation Oncology | Admitting: Radiation Oncology

## 2018-12-06 ENCOUNTER — Ambulatory Visit: Payer: PRIVATE HEALTH INSURANCE

## 2018-12-06 DIAGNOSIS — Z510 Encounter for antineoplastic radiation therapy: Secondary | ICD-10-CM | POA: Insufficient documentation

## 2018-12-06 DIAGNOSIS — C50511 Malignant neoplasm of lower-outer quadrant of right female breast: Secondary | ICD-10-CM | POA: Insufficient documentation

## 2018-12-07 ENCOUNTER — Ambulatory Visit: Payer: PRIVATE HEALTH INSURANCE

## 2018-12-10 ENCOUNTER — Ambulatory Visit
Admission: RE | Admit: 2018-12-10 | Discharge: 2018-12-10 | Disposition: A | Discharge status: Home / Self Care | Payer: PRIVATE HEALTH INSURANCE | Source: Ambulatory Visit | Attending: Radiation Oncology | Admitting: Radiation Oncology

## 2018-12-10 DIAGNOSIS — C50511 Malignant neoplasm of lower-outer quadrant of right female breast: Secondary | ICD-10-CM | POA: Diagnosis not present

## 2018-12-10 DIAGNOSIS — Z510 Encounter for antineoplastic radiation therapy: Secondary | ICD-10-CM | POA: Diagnosis present

## 2018-12-11 ENCOUNTER — Ambulatory Visit
Admission: RE | Admit: 2018-12-11 | Discharge: 2018-12-11 | Disposition: A | Discharge status: Home / Self Care | Payer: PRIVATE HEALTH INSURANCE | Source: Ambulatory Visit | Attending: Radiation Oncology | Admitting: Radiation Oncology

## 2018-12-11 DIAGNOSIS — Z510 Encounter for antineoplastic radiation therapy: Secondary | ICD-10-CM | POA: Diagnosis not present

## 2018-12-12 ENCOUNTER — Ambulatory Visit
Admission: RE | Admit: 2018-12-12 | Discharge: 2018-12-12 | Disposition: A | Discharge status: Home / Self Care | Payer: PRIVATE HEALTH INSURANCE | Source: Ambulatory Visit | Attending: Radiation Oncology | Admitting: Radiation Oncology

## 2018-12-12 ENCOUNTER — Ambulatory Visit: Payer: PRIVATE HEALTH INSURANCE

## 2018-12-12 DIAGNOSIS — Z510 Encounter for antineoplastic radiation therapy: Secondary | ICD-10-CM | POA: Diagnosis not present

## 2018-12-13 ENCOUNTER — Ambulatory Visit
Admission: RE | Admit: 2018-12-13 | Discharge: 2018-12-13 | Disposition: A | Discharge status: Home / Self Care | Payer: PRIVATE HEALTH INSURANCE | Source: Ambulatory Visit | Attending: Radiation Oncology | Admitting: Radiation Oncology

## 2018-12-13 ENCOUNTER — Ambulatory Visit: Payer: PRIVATE HEALTH INSURANCE

## 2018-12-13 ENCOUNTER — Other Ambulatory Visit: Payer: Self-pay

## 2018-12-13 DIAGNOSIS — Z510 Encounter for antineoplastic radiation therapy: Secondary | ICD-10-CM | POA: Diagnosis not present

## 2018-12-14 ENCOUNTER — Ambulatory Visit: Payer: PRIVATE HEALTH INSURANCE

## 2018-12-14 ENCOUNTER — Ambulatory Visit
Admission: RE | Admit: 2018-12-14 | Discharge: 2018-12-14 | Disposition: A | Discharge status: Home / Self Care | Payer: PRIVATE HEALTH INSURANCE | Source: Ambulatory Visit | Attending: Radiation Oncology | Admitting: Radiation Oncology

## 2018-12-14 ENCOUNTER — Other Ambulatory Visit: Payer: Self-pay

## 2018-12-14 DIAGNOSIS — Z510 Encounter for antineoplastic radiation therapy: Secondary | ICD-10-CM | POA: Diagnosis not present

## 2018-12-17 ENCOUNTER — Ambulatory Visit: Payer: PRIVATE HEALTH INSURANCE

## 2018-12-18 ENCOUNTER — Ambulatory Visit: Payer: PRIVATE HEALTH INSURANCE

## 2018-12-23 ENCOUNTER — Other Ambulatory Visit: Payer: Self-pay

## 2018-12-24 ENCOUNTER — Other Ambulatory Visit: Payer: Self-pay

## 2018-12-24 ENCOUNTER — Inpatient Hospital Stay: Payer: PRIVATE HEALTH INSURANCE | Attending: Oncology

## 2018-12-24 DIAGNOSIS — Z452 Encounter for adjustment and management of vascular access device: Secondary | ICD-10-CM | POA: Diagnosis not present

## 2018-12-24 DIAGNOSIS — C50511 Malignant neoplasm of lower-outer quadrant of right female breast: Secondary | ICD-10-CM | POA: Insufficient documentation

## 2018-12-24 DIAGNOSIS — Z95828 Presence of other vascular implants and grafts: Secondary | ICD-10-CM

## 2018-12-24 MED ORDER — HEPARIN SOD (PORK) LOCK FLUSH 100 UNIT/ML IV SOLN
500.0000 [IU] | Freq: Once | INTRAVENOUS | Status: AC
  Administered 2018-12-24: 500 [IU] via INTRAVENOUS

## 2018-12-24 MED ORDER — SODIUM CHLORIDE 0.9% FLUSH
10.0000 mL | Freq: Once | INTRAVENOUS | Status: AC
  Administered 2018-12-24: 10 mL via INTRAVENOUS
  Filled 2018-12-24: qty 10

## 2018-12-27 ENCOUNTER — Telehealth: Payer: Self-pay | Admitting: Family Medicine

## 2018-12-27 NOTE — Telephone Encounter (Signed)
6 month supply called in 11/13/18- please check on this.

## 2018-12-27 NOTE — Telephone Encounter (Signed)
Spoke with Wells Guiles at pharmacy. They do have RX ready. They will notify the patient.

## 2018-12-27 NOTE — Telephone Encounter (Signed)
Copied from [place] [id number]. Topic: Quick Communication - Rx Refill/Question >> Dec 27, 2018  3:27 PM Richardo Priest, NT wrote: Medication:  metoprolol succinate (TOPROL-XL) 50 MG 24 hr tablet  Has the patient contacted their pharmacy? {Yes patient called pharmacy and stated they have no more refills.  Preferred Pharmacy (with phone number or street name):  [facility] ([facility] [place] [phone] (Phone) [phone] (Fax)  Agent: Please be advised that RX refills may take up to 3 business days. We ask that you follow-up with your pharmacy.

## 2019-01-03 ENCOUNTER — Telehealth: Payer: Self-pay

## 2019-01-03 NOTE — Telephone Encounter (Signed)
T/C to patient to schedule SCP visit over telephone and patient very agreeable to this method.  SCP and Treatment summary along with ASCO booklet mailed to patient.  Will call patient on Thursday at 2:00.

## 2019-01-07 ENCOUNTER — Encounter: Payer: Self-pay | Admitting: *Deleted

## 2019-01-07 NOTE — Progress Notes (Signed)
Patient returned my call.  States she is doing well.  She did have a question regarding her insurance.  Recommended she call Raynelle Highland Chrismon or Elliot Gault for further guidance..  No other needs at this time.

## 2019-01-07 NOTE — Progress Notes (Signed)
Called patient to follow up post completion of her radiation therapy.  Left message to return my call.

## 2019-01-09 ENCOUNTER — Telehealth: Payer: Self-pay | Admitting: Oncology

## 2019-01-10 ENCOUNTER — Ambulatory Visit: Payer: PRIVATE HEALTH INSURANCE

## 2019-01-10 ENCOUNTER — Other Ambulatory Visit: Payer: Self-pay

## 2019-01-10 ENCOUNTER — Inpatient Hospital Stay: Payer: PRIVATE HEALTH INSURANCE

## 2019-01-10 ENCOUNTER — Inpatient Hospital Stay: Payer: PRIVATE HEALTH INSURANCE | Attending: Oncology | Admitting: Oncology

## 2019-01-10 DIAGNOSIS — C50919 Malignant neoplasm of unspecified site of unspecified female breast: Secondary | ICD-10-CM

## 2019-01-10 DIAGNOSIS — Z853 Personal history of malignant neoplasm of breast: Secondary | ICD-10-CM

## 2019-01-10 NOTE — Progress Notes (Signed)
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   BRIEF ONCOLOGIC HISTORY:  Oncology History   Wendy Marsh, initially presented as 63 year old female with past medical history as below who was referred by PCP, for evaluation for breast cancer.  Patient initially presented with self palpated right breast mass for 1 month.  PCP ordered diagnostic mammogram (04/27/18) which showed: Highly suspicious spiculated mass in right breast at 6:00 o'clock axis, 9cm from the nipple, measuring 2.9 cm corresponding to area of concern.  Targeted ultrasound showed irregular, hypoechoic mass.  Right axilla showed no enlarged or morphologically abnormal lymph nodes. 05/04/2018-breast mass was biopsied.  Pathology: Invasive mammary carcinoma, grade 3, LVSI suspicious, favor present.  ER- PR- HER2-  She denied nipple discharge, breast skin changes. Denies history of radiation to the chest or previous breast surgery.  She initially presented with multiple family members including husband, 3 daughters, and son-in-law.  At baseline she uses a walker for ambulation.  Chronic right knee pain due to osteoarthritis.  History of paroxysmal A. fib, on metoprolol for rate control.  On aspirin 81 mg daily.  MUGA-baseline MUGA scan revealed systolic function depression, LVEF 45%.  Given possible cardiac toxicity of Adriamycin and potential for irreversible cardiomyopathy, non-anthracycline-containing regimen was discussed with patient.    Dr. Tasia Catchings recommended Docetaxel '75mg'$ /m2 and Carboplatin every 3 weeks for 4-6 cycles based on data from PROGECT study, [facility], Pathological Response and Survival in Triple-Negative Breast Cancer Following Neoadjuvant Carboplatin plus Docetaxel. Stage I-III TNBC patients, Neoadjuvant carboplatin (AUC6) plus docetaxel (75 mg/m2) every 21 days  6 cycles. pCR was 55% and Residual cancer burden  (RCB) were 13%. Excellent Three-year RFS was 90% in patients with pCR and 66% in those without pCR.  She initiated cycle 1 of neoadjuvant carboplatin-Taxotere on 05/22/2018.  She receives [place] for prophylaxis of febrile neutropenias related to chemotherapy.    Genetic Testing & Family History: Half-Sister & grandmother w/ history of breast cancer. Father deceased from esophageal cancer.  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 Tasia Catchings recommended genetic testing which was performed by Wendy Marsh, genetic counselor. [employer]'s 84 gene multi cancer panel was performed which did not reveal any pathogenic mutations. VUS detected: SUFU c.992G>A (p.Arg331GIn) which is still considered a normal result.  Based on findings, her cancer is most likely not due to an inherited predisposition.  #Patient evaluated by Dr. Nehemiah Massed based on findings of MUGA scan.  Repeated echocardiogram showed normal EF of 55%. Per his note, no chemotherapy restriction.  Patient bolused risk possible for cardiovascular complications.  Currently no evidence of active and/or significant angina and/or congestive heart failure.  She was recommended by cardiology to hold flecainide 2 to 3 days before and after chemo if Zofran will be used to reduce QT interval changes and interaction.     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Wendy Marsh)   05/08/2018 Initial Diagnosis    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Wendy Marsh)    05/09/2018 - 05/14/2018 Chemotherapy    The patient had DOXOrubicin (ADRIAMYCIN) chemo injection 146 mg, 60 mg/m2, Intravenous,  Once, 0 of 4 cycles palonosetron (ALOXI) injection 0.25 mg, 0.25 mg, Intravenous,  Once, 0 of 8 cycles pegfilgrastim-cbqv (UDENYCA) injection 6 mg, 6 mg, Subcutaneous, Once, 0 of 4 cycles CARBOplatin (PARAPLATIN) in sodium chloride 0.9 % 100 mL chemo infusion, , Intravenous,  Once, 0 of 4 cycles cyclophosphamide (CYTOXAN) 1,460 mg in  sodium chloride 0.9 % 250 mL chemo infusion, 600 mg/m2, Intravenous,  Once, 0 of 4 cycles PACLitaxel (TAXOL) 192 mg in sodium chloride 0.9 % 250 mL chemo infusion (</= '80mg'$ /m2), 80 mg/m2, Intravenous,  Once, 0 of 4 cycles fosaprepitant (EMEND) 150 mg, dexamethasone (DECADRON) 12 mg in sodium chloride 0.9 % 145 mL IVPB, , Intravenous,  Once, 0 of 8 cycles  for chemotherapy treatment.     05/22/2018 -  Chemotherapy    The patient had palonosetron (ALOXI) injection 0.25 mg, 0.25 mg, Intravenous,  Once, 6 of 6 cycles Administration: 0.25 mg (05/22/2018), 0.25 mg (06/12/2018), 0.25 mg (07/03/2018), 0.25 mg (07/24/2018), 0.25 mg (08/21/2018), 0.25 mg (09/11/2018) pegfilgrastim-cbqv (UDENYCA) injection 6 mg, 6 mg, Subcutaneous, Once, 6 of 6 cycles Administration: 6 mg (05/24/2018), 6 mg (06/15/2018), 6 mg (07/05/2018), 6 mg (07/26/2018), 6 mg (08/23/2018), 6 mg (09/13/2018) CARBOplatin (PARAPLATIN) 750 mg in sodium chloride 0.9 % 250 mL chemo infusion, 750 mg (100 % of original dose 750 mg), Intravenous,  Once, 6 of 6 cycles Dose modification:   (original dose 750 mg, Cycle 1),   (original dose 900 mg, Cycle 2) Administration: 750 mg (05/22/2018), 900 mg (06/12/2018), 900 mg (07/03/2018), 900 mg (07/24/2018), 490 mg (08/21/2018), 490 mg (09/11/2018) DOCEtaxel (TAXOTERE) 140 mg in sodium chloride 0.9 % 250 mL chemo infusion, 75 mg/m2 = 140 mg, Intravenous,  Once, 6 of 6 cycles Administration: 140 mg (05/22/2018), 140 mg (06/12/2018), 140 mg (07/03/2018), 140 mg (07/24/2018), 140 mg (08/21/2018), 140 mg (09/11/2018)  for chemotherapy treatment.      Malignant neoplasm of lower-outer quadrant of right female breast ([place])   04/27/2018 Mammogram    DIAGNOSTIC MAMMOGRAM BILATERAL & US BREAST RIGHT 1. Highly suspicious mass within the RIGHT breast at the 6 o'clock axis, 9 cm from the nipple, measuring 2.9 cm, corresponding to the area of clinical concern. Ultrasound-guided biopsy is recommended. 2. No evidence of  malignancy within the LEFT breast.  Targeted ultrasound is performed, showing an irregular hypoechoic mass in the RIGHT breast at the 6 o'clock axis, 9 cm from the nipple, with internal vascularity, measuring 2.9 cm greatest dimension, corresponding to the palpable lump. Ultrasound of the RIGHT axilla shows no enlarged or morphologically abnormal lymph nodes.    05/02/2018 Initial Biopsy    A. RIGHT BREAST, LOWER OUTER QUADRANT; ULTRASOUND-GUIDED CORE BIOPSY:  - INVASIVE MAMMARY CARCINOMA.   Size of invasive carcinoma: 15 mm in this sample  Histologic grade of invasive carcinoma: Grade 3            Glandular/tubular differentiation score: 3            Nuclear pleomorphism score: 3            Mitotic rate score: 3            Total score: 9  Ductal carcinoma in situ: Not identified  Lymphovascular invasion: Suspicious, favor present      05/08/2018 Initial Diagnosis    Malignant neoplasm of lower-outer quadrant of right female breast ([place])    05/08/2018 Cancer Staging    Staging form: Breast, AJCC 8th Edition - Clinical stage from 05/08/2018: Stage IIB (cT2, cN0, cM0, G3, ER-, PR-, HER2-) - Signed by Earlie Server, MD on 05/08/2018    05/08/2018 Tumor Marker    05/08/18- CA 27.29 25.7  CA 15-3 11.2    05/14/2018 Echocardiogram    MUGA- calculated LVEF 45%- She was referred to cardiology.  05/22/2018 -  Neo-Adjuvant Chemotherapy    Initial plan for DD-AC followed by Taxol and Carboplatin, however, given cardiotoxicity concerns of Adriamycin and findings on MUGA, Docetaxel and Carboplatin every 3 weeks for 4-6 cycles (based on data from PROGECT study).   She would receive Udenyca for prophylaxis for chemotherapy induced neutropenia and to prevent febrile neutropenias.     05/22/2018 Genetic Testing    Genetic Testing- she has a family history of breast cancer and a personal history of triple negative breast cancer.   On 05/08/18, she underwent  [employer]'s 84 gene multi-cancer panel which did not reveal pathogenic mutation. BRCA negative. VUS was detected: SUFU c.992G>A (p.Arg331Gln)    11/14/2018 Cancer Staging    Staging form: Breast, AJCC 8th Edition - Pathologic stage from 11/14/2018: No Stage Recommended (ypT0, pN0, cM0, ER-, PR-, HER2-) - Signed by Earlie Server, MD on 11/14/2018      INTERVAL HISTORY:  I connected with Wendy Marsh on 01/10/19 at 08:40 am by telephone visit and verified that I am speaking with the correct person using two identifiers.   I discussed the limitations, risks, security and privacy concerns of performing an evaluation and management service by telemedicine and the availability of in-person appointments. I also discussed with the patient that there may be a patient responsible charge related to this service. The patient expressed understanding and agreed to proceed.   Other persons participating in the visit and their role in the encounter: None  Patient's location: Home  Provider's location: [place]   Chief Complaint: Survivorship Visit  Wendy Marsh presents to the [facility] today for routine follow-up for her history of breast cancer.  Overall, she reports feeling quite well.  She recently has completed radiation with Dr. Donella Stade (12/06/18-12/14/18) to right breast. She is s/p neoadjuvant chemotherapy with complete response (Carbo/Taxotere; 6 cycles, completed on 09/13/18) and wide local excision with sentinel node biopsy by Dr. Rosana Hoes on 10/29/18. Has chronic right knee pain d/t osteoarthritis requiring the use of a walker. Has history of A-fib controled with Metoprolol.   Had Port a cath placed by Dr. Bary Castilla on 05/11/18.  Pre-treatment M[place] completed on 05/14/18 ED of 45%.   Initial Mammogram completed on 04/27/18 ordered by Dr. Wynetta Emery (PCP) revealed the suspicious mass to right breast at 6 o'clock. Reccommended biopsy. Pathology revealed invasive mammary carcinoma. No DCIS.    REVIEW OF SYSTEMS:  Review of Systems  Constitutional: Positive for fatigue. Negative for appetite change, fever and unexpected weight change.  HENT:   Negative for nosebleeds, sore throat and trouble swallowing.   Eyes: Negative.   Respiratory: Positive for shortness of breath. Negative for cough and wheezing.   Cardiovascular: Negative.  Negative for chest pain and leg swelling.  Gastrointestinal: Negative for abdominal pain, blood in stool, constipation, diarrhea, nausea and vomiting.  Endocrine: Negative.   Genitourinary: Negative.  Negative for bladder incontinence, hematuria and nocturia.   Musculoskeletal: Positive for gait problem and myalgias. Negative for back pain and flank pain.  Skin: Negative.   Neurological: Positive for gait problem. Negative for dizziness, headaches, light-headedness and numbness.  Hematological: Negative.   Psychiatric/Behavioral: Negative.  Negative for confusion. The patient is not nervous/anxious.    Breast: Denies any new nodularity, masses, tenderness, nipple changes, or nipple discharge.   PAST MEDICAL/SURGICAL HISTORY:  Past Medical History:  Diagnosis Date  . Arthritis   . Atrial fibrillation ([place])   . Dysrhythmia    afib  . Hyperlipidemia   . Hypertension   .  Hypothyroidism   . Lumbago   . Osteoporosis   . Thyroid disease    Past Surgical History:  Procedure Laterality Date  . BREAST BIOPSY Right 05/02/2018   Invasive mammary carcinoma, grade 3, neo adj chemo  . BREAST LUMPECTOMY Right 10/29/2018   path pending  . BREAST LUMPECTOMY WITH NEEDLE LOCALIZATION AND AXILLARY SENTINEL LYMPH NODE BX Right 10/29/2018   Procedure: BREAST LUMPECTOMY WITH NEEDLE LOCALIZATION AND SENTINEL LYMPH NODE BX;  Surgeon: Vickie Epley, MD;  Location: [facility] ORS;  Service: General;  Laterality: Right;  . JOINT REPLACEMENT Left    tkr  . PORTACATH PLACEMENT Right 05/11/2018   Procedure: INSERTION  I.J PORT-A-CATH;  Surgeon: Robert Bellow, MD;   Location: [facility] ORS;  Service: General;  Laterality: Right;  . REPLACEMENT TOTAL KNEE       ALLERGIES:  Allergies  Allergen Reactions  . Gabapentin     "makes me feel kinda out of it"  . Morphine And Related Nausea And Vomiting  . Peanut-Containing Drug Products Swelling  . Tape Rash     CURRENT MEDICATIONS:  Outpatient Encounter Medications as of 01/10/2019  Medication Sig  . acetaminophen (TYLENOL) 500 MG tablet Take 500 mg by mouth every 6 (six) hours as needed.  Marland Kitchen EPINEPHrine 0.3 mg/0.3 mL IJ SOAJ injection Inject 0.3 mg into the muscle as needed for anaphylaxis.   . flecainide (TAMBOCOR) 50 MG tablet Take 50 mg by mouth 2 (two) times daily.   Marland Kitchen lisinopril-hydrochlorothiazide (PRINZIDE,ZESTORETIC) 20-25 MG tablet Take 2 tablets by mouth daily.  . metoprolol succinate (TOPROL-XL) 50 MG 24 hr tablet Take 1 tablet (50 mg total) by mouth daily. Take with or immediately following a meal.  . naproxen sodium (ALEVE) 220 MG tablet Take 440 mg by mouth daily as needed (for pain or headache).  . Nutritional Supplements (JUICE PLUS FIBRE PO) Take 2-4 each by mouth daily.   . potassium chloride SA (K-DUR,KLOR-CON) 20 MEQ tablet Take 1 tablet (20 mEq total) by mouth daily.   No facility-administered encounter medications on file as of 01/10/2019.      ONCOLOGIC FAMILY HISTORY:  Family History  Problem Relation Age of Onset  . Diabetes Mother   . Congestive Heart Failure Mother   . Breast cancer Maternal Grandmother        dx 51s; deceased 61s  . Cancer Maternal Uncle 52       deceased 37s  . Other Father        No information on father or paternal relatives  . Breast cancer Other 32       paternal half-sister; also esophageal ca at 57; currently 51  . Diabetes Other   . Hypertension Other   . Kidney disease Other   . 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 Testing did not reveal  a pathogenic mutation in any of the genes analyzed.  BRCA negative.  VUS was detected. Does not change management.   SOCIAL HISTORY:  Wendy Marsh is married and lives with her spouse in [place]. She has three daughters and a son in Sports coach.  Wendy Marsh is currently retired and not working.  She denies any current or history of tobacco, alcohol, or illicit drug use.    PHYSICAL EXAMINATION: Telephone visit performed.   LABORATORY DATA:  Most Recent:  CBC Latest Ref Rng & Units 10/26/2018 09/11/2018 08/28/2018  WBC 4.0 -  10.5 K/uL 6.0 8.7 16.8(H)  Hemoglobin 12.0 - 15.0 g/dL 10.4(L) 7.9(L) 9.4(L)  Hematocrit 36.0 - 46.0 % 33.6(L) 25.8(L) 30.0(L)  Platelets 150 - 400 K/uL 286 143(L) 392     Chemistry      Component Value Date/Time   NA 143 11/13/2018 1050   K 3.6 11/13/2018 1050   CL 98 11/13/2018 1050   CO2 26 11/13/2018 1050   BUN 14 11/13/2018 1050   CREATININE 0.84 11/13/2018 1050      Component Value Date/Time   CALCIUM 9.1 11/13/2018 1050   ALKPHOS 77 11/13/2018 1050   AST 13 11/13/2018 1050   ALT 14 11/13/2018 1050   BILITOT <0.2 11/13/2018 1050      DIAGNOSTIC IMAGING:  Most recent mammogram:  10/05/18  IMPRESSION: The known right breast 6 o'clock malignancy has resumed an elongated shape, and while it measures larger lengthwise-3.8 cm from the prior measurement of 2.9 cm on the prior ultrasound, the AP dimension has decreased to 0.9 cm from 2.9 cm.  RECOMMENDATION: Continue with plan of care for known right breast malignancy.  I have discussed the findings and recommendations with the patient. Results were also provided in writing at the conclusion of the visit. If applicable, a reminder letter will be sent to the patient regarding the next appointment.  BI-RADS CATEGORY  6: Known biopsy-proven malignancy.   ASSESSMENT AND PLAN:  Wendy Marsh is a pleasant 63 y.o. female with history of Triple Negative breast cancer diagnosed in  August treated with 6 cycles neo-adjuvant chemotherapy with lumpectomy and  adjuvant radiation therapy.  She presents to the Survivorship Clinic for surveillance and routine follow-up.   1. History of breast cancer:  Wendy Marsh is currently clinically and radiographically without evidence of disease or recurrence of breast cancer. She will be due for mammogram in June 2020. I will place orders today. She will return to the cancer center to see her medical oncologist, Dr. Tasia Catchings on 03/11/19.  I encouraged her to call me with any questions or concerns before her next visit at the cancer center, and I would be happy to see her sooner, if needed.    #. Problem(s) at Visit: None  #. Bone health:  Given Wendy Marsh age, history of breast cancer she is at risk for bone demineralization. To date, she has not had a DEXA scan. She is currently not on any anti-hormone therapy because of triple negative diagnosis. This would not benefit her. She was encouraged to increase her consumption of foods rich in calcium, as well as increase her weight-bearing activities.  She was given education on specific food and activities to promote bone health.  #. Cancer screening:  Due to Wendy Marsh's history and her age, she should receive screening for skin cancers, colon cancer, and gynecologic cancers. She was encouraged to follow-up with her PCP for appropriate cancer screenings.   #. Health maintenance and wellness promotion: Wendy Marsh was encouraged to consume 5-7 servings of fruits and vegetables per day. She was also encouraged to engage in moderate to vigorous exercise for 30 minutes per day most days of the week. She was instructed to limit her alcohol consumption and continue to abstain from tobacco use.    Dispo:  -Return to cancer center as scheduled to see Dr. Tasia Catchings in June 2020.    A total of (25) minutes non face-to-face time was spent with this patient with greater than 50% of that time in counseling and  care-coordination.   Anderson Malta  Andreas Ohm [place] [phone]   Note: [place], [place], [place] [phone]

## 2019-01-10 NOTE — Progress Notes (Signed)
Survivorship Care Plan visit completed.  Treatment summary reviewed and given to patient.  ASCO answers booklet reviewed and given to patient.  CARE program and Cancer Transitions discussed with patient along with other resources cancer center offers to patients and caregivers.  Patient verbalized understanding.    

## 2019-01-14 ENCOUNTER — Ambulatory Visit: Payer: PRIVATE HEALTH INSURANCE | Admitting: Radiation Oncology

## 2019-02-04 ENCOUNTER — Ambulatory Visit: Payer: PRIVATE HEALTH INSURANCE

## 2019-02-04 ENCOUNTER — Inpatient Hospital Stay: Payer: PRIVATE HEALTH INSURANCE | Attending: Oncology

## 2019-02-04 ENCOUNTER — Ambulatory Visit: Payer: PRIVATE HEALTH INSURANCE | Admitting: Oncology

## 2019-02-04 ENCOUNTER — Other Ambulatory Visit: Payer: Self-pay

## 2019-02-04 DIAGNOSIS — Z452 Encounter for adjustment and management of vascular access device: Secondary | ICD-10-CM | POA: Diagnosis not present

## 2019-02-04 DIAGNOSIS — C50511 Malignant neoplasm of lower-outer quadrant of right female breast: Secondary | ICD-10-CM | POA: Diagnosis present

## 2019-02-04 DIAGNOSIS — Z95828 Presence of other vascular implants and grafts: Secondary | ICD-10-CM

## 2019-02-04 MED ORDER — HEPARIN SOD (PORK) LOCK FLUSH 100 UNIT/ML IV SOLN
500.0000 [IU] | Freq: Once | INTRAVENOUS | Status: AC
  Administered 2019-02-04: 500 [IU] via INTRAVENOUS

## 2019-02-04 MED ORDER — SODIUM CHLORIDE 0.9% FLUSH
10.0000 mL | Freq: Once | INTRAVENOUS | Status: AC
  Administered 2019-02-04: 10 mL via INTRAVENOUS
  Filled 2019-02-04: qty 10

## 2019-03-11 ENCOUNTER — Other Ambulatory Visit: Payer: Self-pay

## 2019-03-11 ENCOUNTER — Inpatient Hospital Stay (HOSPITAL_BASED_OUTPATIENT_CLINIC_OR_DEPARTMENT_OTHER): Payer: PRIVATE HEALTH INSURANCE | Admitting: Oncology

## 2019-03-11 ENCOUNTER — Encounter: Payer: Self-pay | Admitting: Oncology

## 2019-03-11 ENCOUNTER — Inpatient Hospital Stay: Payer: PRIVATE HEALTH INSURANCE | Attending: Oncology

## 2019-03-11 VITALS — BP 154/79 | HR 63 | Temp 96.2°F | Resp 18 | Wt 308.7 lb

## 2019-03-11 DIAGNOSIS — Z5111 Encounter for antineoplastic chemotherapy: Secondary | ICD-10-CM

## 2019-03-11 DIAGNOSIS — I501 Left ventricular failure: Secondary | ICD-10-CM

## 2019-03-11 DIAGNOSIS — M818 Other osteoporosis without current pathological fracture: Secondary | ICD-10-CM

## 2019-03-11 DIAGNOSIS — Z171 Estrogen receptor negative status [ER-]: Secondary | ICD-10-CM

## 2019-03-11 DIAGNOSIS — C50511 Malignant neoplasm of lower-outer quadrant of right female breast: Secondary | ICD-10-CM

## 2019-03-11 DIAGNOSIS — I110 Hypertensive heart disease with heart failure: Secondary | ICD-10-CM | POA: Insufficient documentation

## 2019-03-11 DIAGNOSIS — Z95828 Presence of other vascular implants and grafts: Secondary | ICD-10-CM | POA: Insufficient documentation

## 2019-03-11 DIAGNOSIS — E876 Hypokalemia: Secondary | ICD-10-CM

## 2019-03-11 DIAGNOSIS — C50919 Malignant neoplasm of unspecified site of unspecified female breast: Secondary | ICD-10-CM

## 2019-03-11 LAB — COMPREHENSIVE METABOLIC PANEL
ALT: 14 U/L (ref 0–44)
AST: 15 U/L (ref 15–41)
Albumin: 3.5 g/dL (ref 3.5–5.0)
Alkaline Phosphatase: 67 U/L (ref 38–126)
Anion gap: 9 (ref 5–15)
BUN: 17 mg/dL (ref 8–23)
CO2: 31 mmol/L (ref 22–32)
Calcium: 8.7 mg/dL — ABNORMAL LOW (ref 8.9–10.3)
Chloride: 100 mmol/L (ref 98–111)
Creatinine, Ser: 0.88 mg/dL (ref 0.44–1.00)
GFR calc Af Amer: >60 mL/min (ref >60)
GFR calc non Af Amer: >60 mL/min (ref >60)
Glucose, Bld: 91 mg/dL (ref 70–99)
Potassium: 3.4 mmol/L — ABNORMAL LOW (ref 3.5–5.1)
Sodium: 140 mmol/L (ref 135–145)
Total Bilirubin: 0.5 mg/dL (ref 0.3–1.2)
Total Protein: 7.3 g/dL (ref 6.5–8.1)

## 2019-03-11 LAB — CBC WITH DIFFERENTIAL/PLATELET
Abs Immature Granulocytes: 0.02 10*3/uL (ref 0.00–0.07)
Basophils Absolute: 0 10*3/uL (ref 0.0–0.1)
Basophils Relative: 0 %
Eosinophils Absolute: 0.3 10*3/uL (ref 0.0–0.5)
Eosinophils Relative: 4 %
HCT: 33.5 % — ABNORMAL LOW (ref 36.0–46.0)
Hemoglobin: 10.5 g/dL — ABNORMAL LOW (ref 12.0–15.0)
Immature Granulocytes: 0 %
Lymphocytes Relative: 22 %
Lymphs Abs: 1.7 10*3/uL (ref 0.7–4.0)
MCH: 28.2 pg (ref 26.0–34.0)
MCHC: 31.3 g/dL (ref 30.0–36.0)
MCV: 90.1 fL (ref 80.0–100.0)
Monocytes Absolute: 0.6 10*3/uL (ref 0.1–1.0)
Monocytes Relative: 8 %
Neutro Abs: 5.1 10*3/uL (ref 1.7–7.7)
Neutrophils Relative %: 66 %
Platelets: 247 10*3/uL (ref 150–400)
RBC: 3.72 MIL/uL — ABNORMAL LOW (ref 3.87–5.11)
RDW: 15.5 % (ref 11.5–15.5)
WBC: 7.7 10*3/uL (ref 4.0–10.5)
nRBC: 0 % (ref 0.0–0.2)

## 2019-03-11 MED ORDER — SODIUM CHLORIDE 0.9% FLUSH
10.0000 mL | Freq: Once | INTRAVENOUS | Status: AC
  Administered 2019-03-11: 10 mL via INTRAVENOUS
  Filled 2019-03-11: qty 10

## 2019-03-11 MED ORDER — HEPARIN SOD (PORK) LOCK FLUSH 100 UNIT/ML IV SOLN
500.0000 [IU] | Freq: Once | INTRAVENOUS | Status: AC
  Administered 2019-03-11: 500 [IU] via INTRAVENOUS

## 2019-03-11 NOTE — Progress Notes (Signed)
Patient here for follow up. Pt states she has "twinges to right breast".

## 2019-03-11 NOTE — Progress Notes (Signed)
Hematology/Oncology Follow up note [facility] [phone] Fax:([phone]) [phone]   Patient Care Team: Valerie Roys, DO as PCP - General (Family Medicine) Thornton Park, MD as Referring Physician (Orthopedic Surgery) Rico Junker, RN as Oncology Nurse Navigator Byrnett, Forest Gleason, MD (General Surgery) Earlie Server, MD as Medical Oncologist (Medical Oncology) Noreene Filbert, MD as Referring Physician (Radiation Oncology)  REASON FOR VISIT Follow up for treatment of Breast cancer, assess of chemotherapy toxicity.  HISTORY OF PRESENTING ILLNESS:  Wendy Marsh is a  63 y.o.  female with PMH listed below who was referred to me for evaluation of breast cancer  04/28/2019 diagnostic mammogram Mammogram showed right breast 6 o'clock axis breast mass, 2.9 cm corresponding to area of concern.  cT2N0  Breast mass was biopsied, and pathology showed invasive mammary carcinoma.  Grade 3 , ER/PR- HER2 -.  LVI suspicious, favor present.   #  baseline MUGA testing done and reviewed systolic function depression, LVEF 45%. Given that Adriamycin has cardiac toxicity, potentially can lead to irreversible cardiomyopathy, I discussed with patient about using non-anthracycline-containing regimen. # 05/23/2019- 09/11/2018  S/p 6 cycle of Docetaxel '75mg'$ /m2 and Carboplatin every 3 weeks [ data from [place] et al 2018 Clin Cancer Res, Pathological Response and Survival in Triple-Negative Breast Cancer Following Neoadjuvant Carboplatin plus Docetaxel.Stage I-III TNBC patients, Neoadjuvant carboplatin (AUC6) plus docetaxel (75 mg/m2) every 21 days  6 cycles. pCR was 55% and Residual cancer burden (RCB) were 13%. Excellent Three-year RFS was 90% in patients with pCR and 66% in those without pCR]    # 10/29/2018 underwent right breast lumpectomy with  excisional biopsy of the right axillary deep sentinel lymph nodes. Pathology showed no residual malignancy  identified.  Previous biopsy site related changes.  Changes consistent with the tumor regression/evaluation.  Margins negative for tumor.  No metastatic carcinoma identified in sentinel lymph node. Right axilla contents excision showed friable adipose tissue with calcifications.  Adjacent lymph nodes with fibrosis, negative for tumor.  Second lymph node with minimal fibrosis.  Negative for tumor.ypT0, ypN0- complete pathological remission.    # # Family history of breast cancer plus personal history of triple negative breast cancer. Testing did not reveal a pathogenic mutation in any of the genes analyzed.  BRCA negative.  VUS was detected. Does not change management.    INTERVAL HISTORY Wendy Marsh is a 63 y.o. female who has above history reviewed by me today presents for discussion of pathology and further management plan for triple negative breast cancer. Patient reports doing well.  No new concerns about her breasts.  Occasionally she feels "twinges to right breast breast. Chronic arthralgia no change. Denies any fever, cough, chills, shortness of breath, chest pain, abdominal pain. .  Review of Systems  Constitutional: Negative for chills, fever, malaise/fatigue and weight loss.  HENT: Negative for nosebleeds and sore throat.   Eyes: Negative for double vision, photophobia and redness.  Respiratory: Negative for cough, shortness of breath and wheezing.   Cardiovascular: Negative for chest pain, palpitations, orthopnea and leg swelling.  Gastrointestinal: Negative for abdominal pain, blood in stool, nausea and vomiting.  Genitourinary: Negative for dysuria.  Musculoskeletal: Positive for joint pain. Negative for back pain, myalgias and neck pain.       Chronic right knee pain.  Skin: Negative for itching and rash.  Neurological: Negative for dizziness, tingling and tremors.  Endo/Heme/Allergies: Negative for environmental allergies. Does not bruise/bleed easily.   Psychiatric/Behavioral: Negative for  depression and hallucinations.    MEDICAL HISTORY:  Past Medical History:  Diagnosis Date  . Arthritis   . Atrial fibrillation ([place])   . Dysrhythmia    afib  . Hyperlipidemia   . Hypertension   . Hypothyroidism   . Lumbago   . Osteoporosis   . Thyroid disease     SURGICAL HISTORY: Past Surgical History:  Procedure Laterality Date  . BREAST BIOPSY Right 05/02/2018   Invasive mammary carcinoma, grade 3, neo adj chemo  . BREAST LUMPECTOMY Right 10/29/2018   path pending  . BREAST LUMPECTOMY WITH NEEDLE LOCALIZATION AND AXILLARY SENTINEL LYMPH NODE BX Right 10/29/2018   Procedure: BREAST LUMPECTOMY WITH NEEDLE LOCALIZATION AND SENTINEL LYMPH NODE BX;  Surgeon: Vickie Epley, MD;  Location: [facility] ORS;  Service: General;  Laterality: Right;  . JOINT REPLACEMENT Left    tkr  . PORTACATH PLACEMENT Right 05/11/2018   Procedure: INSERTION  I.J PORT-A-CATH;  Surgeon: Robert Bellow, MD;  Location: [facility] ORS;  Service: General;  Laterality: Right;  . REPLACEMENT TOTAL KNEE      SOCIAL HISTORY: Social History   Socioeconomic History  . Marital status: Married    Spouse name: jerry  . Number of children: 3  . Years of education: Not on file  . Highest education level: Some college, no degree  Occupational History  . Not on file  Social Needs  . Financial resource strain: Somewhat hard  . Food insecurity:    Worry: Never true    Inability: Never true  . Transportation needs:    Medical: No    Non-medical: No  Tobacco Use  . Smoking status: Never Smoker  . Smokeless tobacco: Never Used  Substance and Sexual Activity  . Alcohol use: No  . Drug use: No  . Sexual activity: Yes    Birth control/protection: Post-menopausal  Lifestyle  . Physical activity:    Days per week: 0 days    Minutes per session: 0 min  . Stress: Only a little  Relationships  . Social connections:    Talks on phone: More than three times a week    Gets  together: Twice a week    Attends religious service: Never    Active member of club or organization: No    Attends meetings of clubs or organizations: Not on file    Relationship status: Married  . Intimate partner violence:    Fear of current or ex partner: No    Emotionally abused: No    Physically abused: No    Forced sexual activity: No  Other Topics Concern  . Not on file  Social History Narrative  . Not on file    FAMILY HISTORY: Family History  Problem Relation Age of Onset  . Diabetes Mother   . Congestive Heart Failure Mother   . Breast cancer Maternal Grandmother        dx 65s; deceased 29s  . Cancer Maternal Uncle 29       deceased 25s  . Other Father        No information on father or paternal relatives  . Breast cancer Other 61       paternal half-sister; also esophageal ca at 66; currently 78  . Diabetes Other   . Hypertension Other   . Kidney disease Other   . Thyroid disease Other     ALLERGIES:  is allergic to gabapentin; morphine and related; peanut-containing drug products; and tape.  MEDICATIONS:  Current Outpatient Medications  Medication Sig Dispense Refill  . acetaminophen (TYLENOL) 500 MG tablet Take 500 mg by mouth every 6 (six) hours as needed.    Marland Kitchen EPINEPHrine 0.3 mg/0.3 mL IJ SOAJ injection Inject 0.3 mg into the muscle as needed for anaphylaxis.   12  . flecainide (TAMBOCOR) 50 MG tablet Take 50 mg by mouth 2 (two) times daily.     Marland Kitchen lidocaine-prilocaine (EMLA) cream APPLY TO AFFECTED AREA ONCE AS DIRECTED    . lisinopril-hydrochlorothiazide (PRINZIDE,ZESTORETIC) 20-25 MG tablet Take 2 tablets by mouth daily. 180 tablet 1  . metoprolol succinate (TOPROL-XL) 50 MG 24 hr tablet Take 1 tablet (50 mg total) by mouth daily. Take with or immediately following a meal. 90 tablet 1  . naproxen sodium (ALEVE) 220 MG tablet Take 440 mg by mouth daily as needed (for pain or headache).    . Nutritional Supplements (JUICE PLUS FIBRE PO) Take 2-4 each by  mouth daily.     . potassium chloride SA (K-DUR,KLOR-CON) 20 MEQ tablet Take 1 tablet (20 mEq total) by mouth daily. 90 tablet 1   No current facility-administered medications for this visit.      PHYSICAL EXAMINATION: ECOG PERFORMANCE STATUS: 1 - Symptomatic but completely ambulatory Vitals:   03/11/19 1322  BP: (!) 154/79  Pulse: 63  Resp: 18  Temp: (!) 96.2 F (35.7 C)   Filed Weights   03/11/19 1322  Weight: (!) 308 lb 11.2 oz (140 kg)    Physical Exam Constitutional:      General: She is not in acute distress.    Comments: Obese, walks with a walker  HENT:     Head: Normocephalic and atraumatic.  Eyes:     General: No scleral icterus.    Pupils: Pupils are equal, round, and reactive to light.  Neck:     Musculoskeletal: Normal range of motion and neck supple.  Cardiovascular:     Rate and Rhythm: Normal rate and regular rhythm.     Heart sounds: Normal heart sounds.  Pulmonary:     Effort: Pulmonary effort is normal. No respiratory distress.     Breath sounds: No wheezing.  Abdominal:     General: Bowel sounds are normal. There is no distension.     Palpations: Abdomen is soft. There is no mass.     Tenderness: There is no abdominal tenderness.  Musculoskeletal: Normal range of motion.        General: No deformity.     Comments: Chronic lower extremity edema.  1+  Skin:    General: Skin is warm and dry.     Findings: No erythema or rash.  Neurological:     Mental Status: She is alert and oriented to person, place, and time.     Cranial Nerves: No cranial nerve deficit.     Coordination: Coordination normal.  Psychiatric:        Behavior: Behavior normal.        Thought Content: Thought content normal.   Breast exam deferred as patient was just examined by Dr. Rosana Hoes on 11/08/2018.   LABORATORY DATA:  I have reviewed the data as listed Lab Results  Component Value Date   WBC 7.7 03/11/2019   HGB 10.5 (L) 03/11/2019   HCT 33.5 (L) 03/11/2019   MCV  90.1 03/11/2019   PLT 247 03/11/2019   Recent Labs    09/11/18 0808  10/29/18 1015 11/13/18 1050 03/11/19 1253  NA 140   < > 140 143 140  K 3.3*   < >  3.4* 3.6 3.4*  CL 103   < > 101 98 100  CO2 27   < > '30 26 31  '$ GLUCOSE 104*   < > 92 87 91  BUN 17   < > '13 14 17  '$ CREATININE 0.72   < > 0.78 0.84 0.88  CALCIUM 9.0   < > 9.2 9.1 8.7*  GFRNONAA >60   < > >60 74 >60  GFRAA >60   < > >60 86 >60  PROT 6.8  --   --  6.6 7.3  ALBUMIN 3.5  --   --  3.6* 3.5  AST 20  --   --  13 15  ALT 20  --   --  14 14  ALKPHOS 74  --   --  77 67  BILITOT 0.6  --   --  <0.2 0.5   < > = values in this interval not displayed.   RADIOGRAPHIC STUDIES: I have personally reviewed the radiological images as listed and agreed with the findings in the report. NM Cardiac Muga rest: Calculated LEFT ventricular ejection fraction equals 45%   ASSESSMENT & PLAN:  1. 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[place])   2. Port-A-Cath in place   3. Heart failure, left, with LVEF 41-49% ([place])   4. Hypokalemia   Cancer Staging Malignant neoplasm of lower-outer quadrant of right female breast [facility]) Staging form: Breast, AJCC 8th Edition - Clinical stage from 05/08/2018: Stage IIB (cT2, cN0, cM0, G3, ER-, PR-, HER2-) - Signed by Earlie Server, MD on 05/08/2018 - Pathologic stage from 11/14/2018: No Stage Recommended (ypT0, pN0, cM0, ER-, PR-, HER2-) - Signed by Earlie Server, MD on 11/14/2018 Labs are reviewed and discussed with patient.  #Triple negative breast cancer,  Status post neoadjuvant chemotherapy with 6 cycles of Doxetaxol and carboplatin. .  Status post right lumpectomy with excisional biopsy of sentinel lymph node. S/p radiation.   Complete pathological remission,  ypT0 ypN0  #Surveillance: Recommend history and physical every 3-6 months for the first 5 years then annually.  Recommend mammography every 12 months, left side [nondiseased] is due to have mammogram in July 2020.  Obtain bilateral diagnostic mammogram.    # Hypokalemia, continue potassium 43mq daily supplementation.  # Mild CHF, continue follow up with cardio..Marland Kitchen  #Port-A-Cath in place, recommend patient to keep for 1 to 2 years given high recurrence rate during the first 2 years after treatments in triple negative breast cancer.  She voices understanding.  Continue  port flush every 6 weeks   Orders Placed This Encounter  Procedures  . MM DIAG BREAST TOMO BILATERAL    Standing Status:   Future    Standing Expiration Date:   03/10/2020    Order Specific Question:   Reason for Exam (SYMPTOM  OR DIAGNOSIS REQUIRED)    Answer:   History of right breast cancer    Order Specific Question:   Preferred imaging location?    Answer:   [facility]    Return of visit: 3 months with repeat CBC and CMP, breast cancer tumor markers..Earlie Server MD, PhD 03/11/2019

## 2019-03-12 LAB — CANCER ANTIGEN 15-3: CA 15-3: 8.6 U/mL (ref 0.0–25.0)

## 2019-03-12 LAB — CANCER ANTIGEN 27.29: CA 27.29: 14.2 U/mL (ref 0.0–38.6)

## 2019-03-18 ENCOUNTER — Encounter: Payer: Self-pay | Admitting: Radiation Oncology

## 2019-03-18 ENCOUNTER — Ambulatory Visit
Admission: RE | Admit: 2019-03-18 | Discharge: 2019-03-18 | Disposition: A | Discharge status: Home / Self Care | Payer: PRIVATE HEALTH INSURANCE | Source: Ambulatory Visit | Attending: Radiation Oncology | Admitting: Radiation Oncology

## 2019-03-18 ENCOUNTER — Other Ambulatory Visit: Payer: Self-pay

## 2019-03-18 VITALS — BP 180/85 | HR 63 | Temp 96.8°F | Resp 16 | Wt 313.4 lb

## 2019-03-18 DIAGNOSIS — C50511 Malignant neoplasm of lower-outer quadrant of right female breast: Secondary | ICD-10-CM | POA: Diagnosis not present

## 2019-03-18 DIAGNOSIS — Z171 Estrogen receptor negative status [ER-]: Secondary | ICD-10-CM | POA: Diagnosis not present

## 2019-03-18 DIAGNOSIS — Z923 Personal history of irradiation: Secondary | ICD-10-CM | POA: Diagnosis not present

## 2019-03-18 DIAGNOSIS — C50919 Malignant neoplasm of unspecified site of unspecified female breast: Secondary | ICD-10-CM

## 2019-03-18 NOTE — Progress Notes (Signed)
Radiation Oncology Follow up Note  Name: Wendy Marsh   Date:   03/18/2019 MRN:  [id number] DOB: 30-Jun-1956    This 63 y.o. female presents to the clinic today for 1 month follow-up status post whole breast radiation to her right breast for stage IIb (T2 N0 M0) triple negative invasive mammary carcinoma.  She was treated with a hypofractionated course of treatment external beam accelerated partial breast radiation  REFERRING PROVIDER: Valerie Roys, DO  HPI: Patient is a 63 year old female now seen at 1 month having completed.  External beam hypofractionated accelerated partial breast radiation.  Her tumor was triple negative and she had extremely large pendulous breast.  She is seen today 1 month out and is doing well specifically nice skin reaction fatigue breast tenderness cough or bone pain.  Based on the triple negative nature of her disease she is not on antiestrogen therapy.  COMPLICATIONS OF TREATMENT: none  FOLLOW UP COMPLIANCE: keeps appointments   PHYSICAL EXAM:  BP (!) 180/85 (BP Location: Left Arm, Patient Position: Sitting)   Pulse 63   Temp (!) 96.8 F (36 C) (Tympanic)   Resp 16   Wt (!) 313 lb 6.1 oz (142.1 kg)   LMP 03/17/2009 (Approximate)   BMI 55.51 kg/m  Lungs are clear to A&P cardiac examination essentially unremarkable with regular rate and rhythm. No dominant mass or nodularity is noted in either breast in 2 positions examined. Incision is well-healed. No axillary or supraclavicular adenopathy is appreciated. Cosmetic result is excellent.  Well-developed well-nourished patient in NAD. HEENT reveals PERLA, EOMI, discs not visualized.  Oral cavity is clear. No oral mucosal lesions are identified. Neck is clear without evidence of cervical or supraclavicular adenopathy. Lungs are clear to A&P. Cardiac examination is essentially unremarkable with regular rate and rhythm without murmur rub or thrill. Abdomen is benign with no organomegaly or masses noted. Motor  sensory and DTR levels are equal and symmetric in the upper and lower extremities. Cranial nerves II through XII are grossly intact. Proprioception is intact. No peripheral adenopathy or edema is identified. No motor or sensory levels are noted. Crude visual fields are within normal range.  RADIOLOGY RESULTS: No current films for review  PLAN: Present time patient is doing well 1 month out from partial breast radiation.  I am pleased with her overall progress.  I have asked to see her back in 4 to 5 months and then will go to 15-month follow-up.  Patient knows to call at anytime with any concerns.  I would like to take this opportunity to thank you for allowing me to participate in the care of your patient.Noreene Filbert, MD

## 2019-04-22 ENCOUNTER — Inpatient Hospital Stay: Payer: PRIVATE HEALTH INSURANCE | Attending: Oncology

## 2019-04-22 ENCOUNTER — Other Ambulatory Visit: Payer: Self-pay

## 2019-04-22 ENCOUNTER — Other Ambulatory Visit: Payer: Self-pay | Admitting: *Deleted

## 2019-04-22 DIAGNOSIS — Z452 Encounter for adjustment and management of vascular access device: Secondary | ICD-10-CM | POA: Insufficient documentation

## 2019-04-22 DIAGNOSIS — C50511 Malignant neoplasm of lower-outer quadrant of right female breast: Secondary | ICD-10-CM

## 2019-04-22 DIAGNOSIS — Z95828 Presence of other vascular implants and grafts: Secondary | ICD-10-CM

## 2019-04-22 MED ORDER — SODIUM CHLORIDE 0.9% FLUSH
10.0000 mL | Freq: Once | INTRAVENOUS | Status: AC
  Administered 2019-04-22: 10 mL via INTRAVENOUS
  Filled 2019-04-22: qty 10

## 2019-04-22 MED ORDER — HEPARIN SOD (PORK) LOCK FLUSH 100 UNIT/ML IV SOLN
500.0000 [IU] | Freq: Once | INTRAVENOUS | Status: AC
  Administered 2019-04-22: 500 [IU] via INTRAVENOUS

## 2019-04-29 ENCOUNTER — Ambulatory Visit
Admission: RE | Admit: 2019-04-29 | Discharge: 2019-04-29 | Disposition: A | Discharge status: Home / Self Care | Payer: PRIVATE HEALTH INSURANCE | Source: Ambulatory Visit | Attending: Oncology | Admitting: Oncology

## 2019-04-29 DIAGNOSIS — C50919 Malignant neoplasm of unspecified site of unspecified female breast: Secondary | ICD-10-CM | POA: Diagnosis not present

## 2019-04-29 HISTORY — DX: Personal history of antineoplastic chemotherapy: Z92.21

## 2019-04-30 ENCOUNTER — Telehealth: Payer: Self-pay

## 2019-04-30 NOTE — Telephone Encounter (Signed)
Left message for patient to call office regarding scheduling her with Dr.Pabon / Dr.Piscoya once her mammogram has been scheduled.

## 2019-05-01 ENCOUNTER — Telehealth: Payer: Self-pay | Admitting: General Practice

## 2019-05-01 NOTE — Telephone Encounter (Signed)
Spoke with patient and she will check with gynecologist regarding breast exams and will call office if she wants to continue here.

## 2019-05-01 NOTE — Telephone Encounter (Signed)
Patient was calling said she was returning your call. Please call patient and advise.

## 2019-05-08 ENCOUNTER — Ambulatory Visit: Payer: PRIVATE HEALTH INSURANCE | Admitting: Surgery

## 2019-05-21 ENCOUNTER — Encounter: Payer: Self-pay | Admitting: Family Medicine

## 2019-05-21 ENCOUNTER — Ambulatory Visit (INDEPENDENT_AMBULATORY_CARE_PROVIDER_SITE_OTHER): Payer: PRIVATE HEALTH INSURANCE | Admitting: Family Medicine

## 2019-05-21 ENCOUNTER — Other Ambulatory Visit: Payer: Self-pay

## 2019-05-21 VITALS — BP 151/53 | HR 59 | Temp 98.5°F | Ht 61.5 in | Wt 311.0 lb

## 2019-05-21 DIAGNOSIS — E782 Mixed hyperlipidemia: Secondary | ICD-10-CM

## 2019-05-21 DIAGNOSIS — Z1211 Encounter for screening for malignant neoplasm of colon: Secondary | ICD-10-CM

## 2019-05-21 DIAGNOSIS — Z0000 Encounter for general adult medical examination without abnormal findings: Secondary | ICD-10-CM

## 2019-05-21 DIAGNOSIS — E039 Hypothyroidism, unspecified: Secondary | ICD-10-CM

## 2019-05-21 DIAGNOSIS — Z171 Estrogen receptor negative status [ER-]: Secondary | ICD-10-CM

## 2019-05-21 DIAGNOSIS — I480 Paroxysmal atrial fibrillation: Secondary | ICD-10-CM | POA: Diagnosis not present

## 2019-05-21 DIAGNOSIS — I10 Essential (primary) hypertension: Secondary | ICD-10-CM | POA: Diagnosis not present

## 2019-05-21 DIAGNOSIS — Z23 Encounter for immunization: Secondary | ICD-10-CM

## 2019-05-21 DIAGNOSIS — C50919 Malignant neoplasm of unspecified site of unspecified female breast: Secondary | ICD-10-CM

## 2019-05-21 DIAGNOSIS — C50511 Malignant neoplasm of lower-outer quadrant of right female breast: Secondary | ICD-10-CM

## 2019-05-21 LAB — UA/M W/RFLX CULTURE, ROUTINE
Bilirubin, UA: NEGATIVE
Glucose, UA: NEGATIVE
Ketones, UA: NEGATIVE
Leukocytes,UA: NEGATIVE
Nitrite, UA: NEGATIVE
Protein,UA: NEGATIVE
RBC, UA: NEGATIVE
Specific Gravity, UA: 1.01 (ref 1.005–1.030)
Urobilinogen, Ur: 0.2 mg/dL (ref 0.2–1.0)
pH, UA: 5.5 (ref 5.0–7.5)

## 2019-05-21 LAB — MICROALBUMIN, URINE WAIVED
Creatinine, Urine Waived: 50 mg/dL (ref 10–300)
Microalb, Ur Waived: 30 mg/L — ABNORMAL HIGH (ref 0–19)

## 2019-05-21 MED ORDER — METOPROLOL SUCCINATE ER 50 MG PO TB24: 50.0000 mg | ORAL_TABLET | Freq: Every day | ORAL | 1 refills | Status: DC

## 2019-05-21 MED ORDER — LISINOPRIL-HYDROCHLOROTHIAZIDE 20-25 MG PO TABS: 2.0000 | ORAL_TABLET | Freq: Every day | ORAL | 1 refills | Status: DC

## 2019-05-21 NOTE — Assessment & Plan Note (Signed)
Stable. Continue to follow with breast surgery and oncology. Call with any concerns.

## 2019-05-21 NOTE — Progress Notes (Signed)
BP (!) 151/53   Pulse (!) 59   Temp 98.5 F (36.9 C) (Oral)   Ht 5' 1.5" (1.562 m)   Wt (!) 311 lb (141.1 kg)   LMP 03/17/2009 (Approximate)   SpO2 93%   BMI 57.81 kg/m    Subjective:    Patient ID: Wendy Marsh, female    DOB: Jun 20, 1956, 63 y.o.   MRN: [id number]  HPI: Wendy Marsh is a 63 y.o. female presenting on 05/21/2019 for comprehensive medical examination. Current medical complaints include:  HYPERTENSION / HYPERLIPIDEMIA Satisfied with current treatment? yes Duration of hypertension: chronic BP monitoring frequency: not checking BP medication side effects: no Past BP meds: lisinopril- HCTZ, metoprolol Duration of hyperlipidemia: chronic Cholesterol medication side effects: not on anything Medication compliance: good compliance Aspirin: no Recent stressors: yes Recurrent headaches: no Visual changes: no Palpitations: no Dyspnea: no Chest pain: no Lower extremity edema: yes Dizzy/lightheaded: no  Menopausal Symptoms: no  Depression Screen done today and results listed below:  Depression screen [facility] 2/9 05/21/2019 03/06/2018 05/10/2017 02/26/2016  Decreased Interest 0 0 0 0  Down, Depressed, Hopeless 0 0 0 0  PHQ - 2 Score 0 0 0 0    Past Medical History:  Past Medical History:  Diagnosis Date  . Arthritis   . Atrial fibrillation ([place])   . Breast cancer ([place]) 04/2018   right breast  . Dysrhythmia    afib  . Hyperlipidemia   . Hypertension   . Hypotension due to drugs 05/30/2018  . Hypothyroidism   . Lumbago   . Osteoporosis   . Personal history of chemotherapy   . Thyroid disease     Surgical History:  Past Surgical History:  Procedure Laterality Date  . BREAST BIOPSY Right 05/02/2018   Invasive mammary carcinoma, grade 3, neo adj chemo  . BREAST LUMPECTOMY Right 10/29/2018   NO RESIDUAL MALIGNANCY IDENTIFIED.   Marland Kitchen BREAST LUMPECTOMY WITH NEEDLE LOCALIZATION AND AXILLARY SENTINEL LYMPH NODE BX Right 10/29/2018   Procedure: BREAST  LUMPECTOMY WITH NEEDLE LOCALIZATION AND SENTINEL LYMPH NODE BX;  Surgeon: Vickie Epley, MD;  Location: [facility] ORS;  Service: General;  Laterality: Right;  . JOINT REPLACEMENT Left    tkr  . PORTACATH PLACEMENT Right 05/11/2018   Procedure: INSERTION  I.J PORT-A-CATH;  Surgeon: Robert Bellow, MD;  Location: [facility] ORS;  Service: General;  Laterality: Right;  . REPLACEMENT TOTAL KNEE      Medications:  Current Outpatient Medications on File Prior to Visit  Medication Sig  . acetaminophen (TYLENOL) 500 MG tablet Take 500 mg by mouth every 6 (six) hours as needed.  Marland Kitchen EPINEPHrine 0.3 mg/0.3 mL IJ SOAJ injection Inject 0.3 mg into the muscle as needed for anaphylaxis.   . flecainide (TAMBOCOR) 50 MG tablet Take 50 mg by mouth 2 (two) times daily.   Marland Kitchen lidocaine-prilocaine (EMLA) cream APPLY TO AFFECTED AREA ONCE AS DIRECTED  . naproxen sodium (ALEVE) 220 MG tablet Take 440 mg by mouth daily as needed (for pain or headache).  . Nutritional Supplements (JUICE PLUS FIBRE PO) Take 2-4 each by mouth daily.   . potassium chloride SA (K-DUR,KLOR-CON) 20 MEQ tablet Take 1 tablet (20 mEq total) by mouth daily. (Patient not taking: Reported on 05/21/2019)   No current facility-administered medications on file prior to visit.     Allergies:  Allergies  Allergen Reactions  . Gabapentin     "makes me feel kinda out of it"  . Morphine And Related Nausea And  Vomiting  . Peanut-Containing Drug Products Swelling  . Tape Rash    Social History:  Social History   Socioeconomic History  . Marital status: Married    Spouse name: jerry  . Number of children: 3  . Years of education: Not on file  . Highest education level: Some college, no degree  Occupational History  . Not on file  Social Needs  . Financial resource strain: Somewhat hard  . Food insecurity    Worry: Never true    Inability: Never true  . Transportation needs    Medical: No    Non-medical: No  Tobacco Use  . Smoking status:  Never Smoker  . Smokeless tobacco: Never Used  Substance and Sexual Activity  . Alcohol use: No  . Drug use: No  . Sexual activity: Yes    Birth control/protection: Post-menopausal  Lifestyle  . Physical activity    Days per week: 0 days    Minutes per session: 0 min  . Stress: Only a little  Relationships  . Social connections    Talks on phone: More than three times a week    Gets together: Twice a week    Attends religious service: Never    Active member of club or organization: No    Attends meetings of clubs or organizations: Not on file    Relationship status: Married  . Intimate partner violence    Fear of current or ex partner: No    Emotionally abused: No    Physically abused: No    Forced sexual activity: No  Other Topics Concern  . Not on file  Social History Narrative  . Not on file   Social History   Tobacco Use  Smoking Status Never Smoker  Smokeless Tobacco Never Used   Social History   Substance and Sexual Activity  Alcohol Use No    Family History:  Family History  Problem Relation Age of Onset  . Diabetes Mother   . Congestive Heart Failure Mother   . Breast cancer Maternal Grandmother        dx 56s; deceased 67s  . Cancer Maternal Uncle 21       deceased 60s  . Other Father        No information on father or paternal relatives  . Breast cancer Other 24       paternal half-sister; also esophageal ca at 31; currently 63  . Diabetes Other   . Hypertension Other   . Kidney disease Other   . Thyroid disease Other     Past medical history, surgical history, medications, allergies, family history and social history reviewed with patient today and changes made to appropriate areas of the chart.   Review of Systems  Constitutional: Negative.   HENT: Negative.   Respiratory: Negative.   Cardiovascular: Negative.   Gastrointestinal: Negative.   Genitourinary: Negative.   Musculoskeletal: Positive for joint pain. Negative for back pain,  falls, myalgias and neck pain.  Skin: Negative.   Neurological: Negative.   Endo/Heme/Allergies: Negative for environmental allergies and polydipsia. Does not bruise/bleed easily.  Psychiatric/Behavioral: Negative.     All other ROS negative except what is listed above and in the HPI.      Objective:    BP (!) 151/53   Pulse (!) 59   Temp 98.5 F (36.9 C) (Oral)   Ht 5' 1.5" (1.562 m)   Wt (!) 311 lb (141.1 kg)   LMP 03/17/2009 (Approximate)   SpO2 93%  BMI 57.81 kg/m   Wt Readings from Last 3 Encounters:  05/21/19 (!) 311 lb (141.1 kg)  03/18/19 (!) 313 lb 6.1 oz (142.1 kg)  03/11/19 (!) 308 lb 11.2 oz (140 kg)    Physical Exam Vitals signs and nursing note reviewed.  Constitutional:      General: She is not in acute distress.    Appearance: Normal appearance. She is not ill-appearing, toxic-appearing or diaphoretic.  HENT:     Head: Normocephalic and atraumatic.     Right Ear: Tympanic membrane, ear canal and external ear normal. There is no impacted cerumen.     Left Ear: Tympanic membrane, ear canal and external ear normal. There is no impacted cerumen.     Nose: Nose normal. No congestion or rhinorrhea.     Mouth/Throat:     Mouth: Mucous membranes are moist.     Pharynx: Oropharynx is clear. No oropharyngeal exudate or posterior oropharyngeal erythema.  Eyes:     General: No scleral icterus.       Right eye: No discharge.        Left eye: No discharge.     Extraocular Movements: Extraocular movements intact.     Conjunctiva/sclera: Conjunctivae normal.     Pupils: Pupils are equal, round, and reactive to light.  Neck:     Musculoskeletal: Normal range of motion and neck supple. No neck rigidity or muscular tenderness.     Vascular: No carotid bruit.  Cardiovascular:     Rate and Rhythm: Normal rate and regular rhythm.     Pulses: Normal pulses.     Heart sounds: No murmur. No friction rub. No gallop.   Pulmonary:     Effort: Pulmonary effort is normal. No  respiratory distress.     Breath sounds: Normal breath sounds. No stridor. No wheezing, rhonchi or rales.  Chest:     Chest wall: No tenderness.  Abdominal:     General: Abdomen is flat. Bowel sounds are normal. There is no distension.     Palpations: Abdomen is soft. There is no mass.     Tenderness: There is no abdominal tenderness. There is no right CVA tenderness, left CVA tenderness, guarding or rebound.     Hernia: No hernia is present.  Genitourinary:    Comments: Breast and pelvic exams deferred with shared decision making Musculoskeletal:        General: No swelling, tenderness, deformity or signs of injury.     Right lower leg: No edema.     Left lower leg: No edema.  Lymphadenopathy:     Cervical: No cervical adenopathy.  Skin:    General: Skin is warm and dry.     Capillary Refill: Capillary refill takes less than 2 seconds.     Coloration: Skin is not jaundiced or pale.     Findings: No bruising, erythema, lesion or rash.  Neurological:     General: No focal deficit present.     Mental Status: She is alert and oriented to person, place, and time. Mental status is at baseline.     Cranial Nerves: No cranial nerve deficit.     Sensory: No sensory deficit.     Motor: No weakness.     Coordination: Coordination normal.     Gait: Gait normal.     Deep Tendon Reflexes: Reflexes normal.  Psychiatric:        Mood and Affect: Mood normal.        Behavior: Behavior normal.  Thought Content: Thought content normal.        Judgment: Judgment normal.     Results for orders placed or performed in visit on 03/11/19  Cancer antigen 27.29  Result Value Ref Range   CA 27.29 14.2 0.0 - 38.6 U/mL  Cancer antigen 15-3  Result Value Ref Range   CA 15-3 8.6 0.0 - 25.0 U/mL  Comprehensive metabolic panel  Result Value Ref Range   Sodium 140 135 - 145 mmol/L   Potassium 3.4 (L) 3.5 - 5.1 mmol/L   Chloride 100 98 - 111 mmol/L   CO2 31 22 - 32 mmol/L   Glucose, Bld 91 70 -  99 mg/dL   BUN 17 8 - 23 mg/dL   Creatinine, Ser 0.88 0.44 - 1.00 mg/dL   Calcium 8.7 (L) 8.9 - 10.3 mg/dL   Total Protein 7.3 6.5 - 8.1 g/dL   Albumin 3.5 3.5 - 5.0 g/dL   AST 15 15 - 41 U/L   ALT 14 0 - 44 U/L   Alkaline Phosphatase 67 38 - 126 U/L   Total Bilirubin 0.5 0.3 - 1.2 mg/dL   GFR calc non Af Amer >60 >60 mL/min   GFR calc Af Amer >60 >60 mL/min   Anion gap 9 5 - 15  CBC with Differential/Platelet  Result Value Ref Range   WBC 7.7 4.0 - 10.5 K/uL   RBC 3.72 (L) 3.87 - 5.11 MIL/uL   Hemoglobin 10.5 (L) 12.0 - 15.0 g/dL   HCT 33.5 (L) 36.0 - 46.0 %   MCV 90.1 80.0 - 100.0 fL   MCH 28.2 26.0 - 34.0 pg   MCHC 31.3 30.0 - 36.0 g/dL   RDW 15.5 11.5 - 15.5 %   Platelets 247 150 - 400 K/uL   nRBC 0.0 0.0 - 0.2 %   Neutrophils Relative % 66 %   Neutro Abs 5.1 1.7 - 7.7 K/uL   Lymphocytes Relative 22 %   Lymphs Abs 1.7 0.7 - 4.0 K/uL   Monocytes Relative 8 %   Monocytes Absolute 0.6 0.1 - 1.0 K/uL   Eosinophils Relative 4 %   Eosinophils Absolute 0.3 0.0 - 0.5 K/uL   Basophils Relative 0 %   Basophils Absolute 0.0 0.0 - 0.1 K/uL   Immature Granulocytes 0 %   Abs Immature Granulocytes 0.02 0.00 - 0.07 K/uL      Assessment & Plan:   Problem List Items Addressed This Visit      Cardiovascular and Mediastinum   Paroxysmal atrial fibrillation (HCC)    In regular rhythm today. Rate controlled. Continue to monitor.       Relevant Medications   metoprolol succinate (TOPROL-XL) 50 MG 24 hr tablet   lisinopril-hydrochlorothiazide (ZESTORETIC) 20-25 MG tablet   Other Relevant Orders   CBC with Differential/Platelet   Comprehensive metabolic panel   UA/M w/rflx Culture, Routine   Benign essential hypertension    Under fair control on current regimen. Continue current regimen. Continue to monitor. Call with any concerns. Refills given. Labs drawn today.       Relevant Medications   metoprolol succinate (TOPROL-XL) 50 MG 24 hr tablet   lisinopril-hydrochlorothiazide  (ZESTORETIC) 20-25 MG tablet   Other Relevant Orders   CBC with Differential/Platelet   Comprehensive metabolic panel   Microalbumin, Urine Waived   UA/M w/rflx Culture, Routine     Endocrine   Thyroid activity decreased    Rechecking levels today. Treat as needed. Call with any concerns.  Relevant Medications   metoprolol succinate (TOPROL-XL) 50 MG 24 hr tablet   Other Relevant Orders   CBC with Differential/Platelet   Comprehensive metabolic panel   TSH   UA/M w/rflx Culture, Routine     Other   Combined fat and carbohydrate induced hyperlipemia    Under good control on current regimen. Continue current regimen. Continue to monitor. Call with any concerns. Refills given. Labs drawn today.       Relevant Medications   metoprolol succinate (TOPROL-XL) 50 MG 24 hr tablet   lisinopril-hydrochlorothiazide (ZESTORETIC) 20-25 MG tablet   Other Relevant Orders   CBC with Differential/Platelet   Comprehensive metabolic panel   Lipid Panel w/o Chol/HDL Ratio   UA/M w/rflx Culture, Routine   Morbid obesity (HCC)    Down 2lbs since last visit. Continue diet and exercise. Call with any concerns. Work on losing 1-2lbs a week.       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 Continue to follow with breast surgery and oncology. Call with any concerns.       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 Continue to follow with breast surgery and oncology. Call with any concerns.        Other Visit Diagnoses    Routine general medical examination at a health care facility    -  Primary   Vaccines updated. Screening labs checked today. Pap up to date. Mammogram up to date. Cologuard ordered  today. Continue diet and exercise. Call with any concern   Relevant Orders   CBC with Differential/Platelet   Comprehensive metabolic panel   Lipid Panel w/o Chol/HDL Ratio   Microalbumin, Urine Waived   TSH   UA/M w/rflx Culture, Routine   Hepatitis C Antibody   HIV Antibody (routine testing w rflx)   Essential hypertension       Relevant Medications   metoprolol succinate (TOPROL-XL) 50 MG 24 hr tablet   lisinopril-hydrochlorothiazide (ZESTORETIC) 20-25 MG tablet   Screening for colon cancer       Cologuard ordered today   Relevant Orders   Cologuard       Follow up plan: Return in about 6 months (around 11/21/2019).   LABORATORY TESTING:  - Pap smear: up to date  IMMUNIZATIONS:   - Tdap: Tetanus vaccination status reviewed: Tdap vaccination indicated and given today. - Influenza: Postponed to flu season - Pneumovax: Not applicable - Prevnar: Not applicable  SCREENING: -Mammogram: Up to date  - Colonoscopy: Cologuard ordered today   PATIENT COUNSELING:   Advised to take 1 mg of folate supplement per day if capable of pregnancy.   Sexuality: Discussed sexually transmitted diseases, partner selection, use of condoms, avoidance of unintended pregnancy  and contraceptive alternatives.   Advised to avoid cigarette smoking.  I discussed with the patient that most people either abstain from alcohol or drink within safe limits (<=14/week and <=4 drinks/occasion for males, <=7/weeks and <= 3 drinks/occasion for females) and that the risk for alcohol disorders and other health effects rises proportionally with the number of drinks per week and how often a drinker exceeds daily limits.  Discussed cessation/primary prevention of drug use and availability of treatment for abuse.   Diet:  Encouraged to adjust caloric intake to maintain  or achieve ideal body weight, to reduce intake of dietary saturated fat and total fat, to limit sodium intake by avoiding high sodium foods and not  adding table salt, and to maintain adequate dietary potassium and calcium preferably from fresh fruits, vegetables, and low-fat dairy products.    stressed the importance of regular exercise  Injury prevention: Discussed safety belts, safety helmets, smoke detector, smoking near bedding or upholstery.   Dental health: Discussed importance of regular tooth brushing, flossing, and dental visits.    NEXT PREVENTATIVE PHYSICAL DUE IN 1 YEAR. Return in about 6 months (around 11/21/2019).

## 2019-05-21 NOTE — Assessment & Plan Note (Signed)
Down 2lbs since last visit. Continue diet and exercise. Call with any concerns. Work on losing 1-2lbs a week.

## 2019-05-21 NOTE — Assessment & Plan Note (Signed)
Under fair control on current regimen. Continue current regimen. Continue to monitor. Call with any concerns. Refills given. Labs drawn today. 

## 2019-05-21 NOTE — Addendum Note (Signed)
Addended by: Valerie Roys on: 05/21/2019 03:10 PM   Modules accepted: Level of Service

## 2019-05-21 NOTE — Assessment & Plan Note (Signed)
Rechecking levels today. Treat as needed. Call with any concerns.  

## 2019-05-21 NOTE — Assessment & Plan Note (Signed)
Under good control on current regimen. Continue current regimen. Continue to monitor. Call with any concerns. Refills given. Labs drawn today.   

## 2019-05-21 NOTE — Assessment & Plan Note (Signed)
In regular rhythm today. Rate controlled. Continue to monitor.

## 2019-05-21 NOTE — Patient Instructions (Signed)
Health Maintenance for Postmenopausal Women Menopause is a normal process in which your ability to get pregnant comes to an end. This process happens slowly over many months or years, usually between the ages of 48 and 55. Menopause is complete when you have missed your menstrual periods for 12 months. It is important to talk with your health care provider about some of the most common conditions that affect women after menopause (postmenopausal women). These include heart disease, cancer, and bone loss (osteoporosis). Adopting a healthy lifestyle and getting preventive care can help to promote your health and wellness. The actions you take can also lower your chances of developing some of these common conditions. What should I know about menopause? During menopause, you may get a number of symptoms, such as:  Hot flashes. These can be moderate or severe.  Night sweats.  Decrease in sex drive.  Mood swings.  Headaches.  Tiredness.  Irritability.  Memory problems.  Insomnia. Choosing to treat or not to treat these symptoms is a decision that you make with your health care provider. Do I need hormone replacement therapy?  Hormone replacement therapy is effective in treating symptoms that are caused by menopause, such as hot flashes and night sweats.  Hormone replacement carries certain risks, especially as you become older. If you are thinking about using estrogen or estrogen with progestin, discuss the benefits and risks with your health care provider. What is my risk for heart disease and stroke? The risk of heart disease, heart attack, and stroke increases as you age. One of the causes may be a change in the body's hormones during menopause. This can affect how your body uses dietary fats, triglycerides, and cholesterol. Heart attack and stroke are medical emergencies. There are many things that you can do to help prevent heart disease and stroke. Watch your blood pressure  High  blood pressure causes heart disease and increases the risk of stroke. This is more likely to develop in people who have high blood pressure readings, are of African descent, or are overweight.  Have your blood pressure checked: ? Every 3-5 years if you are 18-39 years of age. ? Every year if you are 40 years old or older. Eat a healthy diet   Eat a diet that includes plenty of vegetables, fruits, low-fat dairy products, and lean protein.  Do not eat a lot of foods that are high in solid fats, added sugars, or sodium. Get regular exercise Get regular exercise. This is one of the most important things you can do for your health. Most adults should:  Try to exercise for at least 150 minutes each week. The exercise should increase your heart rate and make you sweat (moderate-intensity exercise).  Try to do strengthening exercises at least twice each week. Do these in addition to the moderate-intensity exercise.  Spend less time sitting. Even light physical activity can be beneficial. Other tips  Work with your health care provider to achieve or maintain a healthy weight.  Do not use any products that contain nicotine or tobacco, such as cigarettes, e-cigarettes, and chewing tobacco. If you need help quitting, ask your health care provider.  Know your numbers. Ask your health care provider to check your cholesterol and your blood sugar (glucose). Continue to have your blood tested as directed by your health care provider. Do I need screening for cancer? Depending on your health history and family history, you may need to have cancer screening at different stages of your life. This   may include screening for:  Breast cancer.  Cervical cancer.  Lung cancer.  Colorectal cancer. What is my risk for osteoporosis? After menopause, you may be at increased risk for osteoporosis. Osteoporosis is a condition in which bone destruction happens more quickly than new bone creation. To help prevent  osteoporosis or the bone fractures that can happen because of osteoporosis, you may take the following actions:  If you are 27-66 years old, get at least 1,000 mg of calcium and at least 600 mg of vitamin D per day.  If you are older than age 109 but younger than age 11, get at least 1,200 mg of calcium and at least 600 mg of vitamin D per day.  If you are older than age 20, get at least 1,200 mg of calcium and at least 800 mg of vitamin D per day. Smoking and drinking excessive alcohol increase the risk of osteoporosis. Eat foods that are rich in calcium and vitamin D, and do weight-bearing exercises several times each week as directed by your health care provider. How does menopause affect my mental health? Depression may occur at any age, but it is more common as you become older. Common symptoms of depression include:  Low or sad mood.  Changes in sleep patterns.  Changes in appetite or eating patterns.  Feeling an overall lack of motivation or enjoyment of activities that you previously enjoyed.  Frequent crying spells. Talk with your health care provider if you think that you are experiencing depression. General instructions See your health care provider for regular wellness exams and vaccines. This may include:  Scheduling regular health, dental, and eye exams.  Getting and maintaining your vaccines. These include: ? Influenza vaccine. Get this vaccine each year before the flu season begins. ? Pneumonia vaccine. ? Shingles vaccine. ? Tetanus, diphtheria, and pertussis (Tdap) booster vaccine. Your health care provider may also recommend other immunizations. Tell your health care provider if you have ever been abused or do not feel safe at home. Summary  Menopause is a normal process in which your ability to get pregnant comes to an end.  This condition causes hot flashes, night sweats, decreased interest in sex, mood swings, headaches, or lack of sleep.  Treatment for this  condition may include hormone replacement therapy.  Take actions to keep yourself healthy, including exercising regularly, eating a healthy diet, watching your weight, and checking your blood pressure and blood sugar levels.  Get screened for cancer and depression. Make sure that you are up to date with all your vaccines. This information is not intended to replace advice given to you by your health care provider. Make sure you discuss any questions you have with your health care provider. Document Released: 11/11/2005 Document Revised: 09/12/2018 Document Reviewed: 09/12/2018 Elsevier Patient Education  [place]. https://www.cdc.gov/vaccines/hcp/vis/vis-statements/tdap.pdf">  Tdap Vaccine (Tetanus, Diphtheria and Pertussis): What You Need to Know 1. Why get vaccinated? Tetanus, diphtheria and pertussis are very serious diseases. Tdap vaccine can protect [place] from these diseases. And, Tdap vaccine given to pregnant women can protect newborn babies against pertussis.Marland Kitchen TETANUS (Lockjaw) is rare in the [place] today. It causes painful muscle tightening and stiffness, usually all over the body.  It can lead to tightening of muscles in the head and neck so you can't open your mouth, swallow, or sometimes even breathe. Tetanus kills about 1 out of 10 people who are infected even after receiving the best medical care. DIPHTHERIA is also rare in the [place]  today. It can cause a thick coating to form in the back of the throat.  It can lead to breathing problems, heart failure, paralysis, and death. PERTUSSIS (Whooping Cough) causes severe coughing spells, which can cause difficulty breathing, vomiting and disturbed sleep.  It can also lead to weight loss, incontinence, and rib fractures. Up to 2 in 100 adolescents and 5 in 100 adults with pertussis are hospitalized or have complications, which could include pneumonia or death. These diseases are caused by bacteria. Diphtheria and  pertussis are spread from person to person through secretions from coughing or sneezing. Tetanus enters the body through cuts, scratches, or wounds. Before vaccines, as many as 200,000 cases of diphtheria, 200,000 cases of pertussis, and hundreds of cases of tetanus, were reported in the [place] each year. Since vaccination began, reports of cases for tetanus and diphtheria have dropped by about 99% and for pertussis by about 80%. 2. Tdap vaccine Tdap vaccine can protect adolescents and adults from tetanus, diphtheria, and pertussis. One dose of Tdap is routinely given at age 91 or 32. People who did not get Tdap at that age should get it as soon as possible. Tdap is especially important for healthcare professionals and anyone having close contact with a baby younger than 12 months. Pregnant women should get a dose of Tdap during every pregnancy, to protect the newborn from pertussis. Infants are most at risk for severe, life-threatening complications from pertussis. Another vaccine, called Td, protects against tetanus and diphtheria, but not pertussis. A Td booster should be given every 10 years. Tdap may be given as one of these boosters if you have never gotten Tdap before. Tdap may also be given after a severe cut or burn to prevent tetanus infection. Your doctor or the person giving you the vaccine can give you more information. Tdap may safely be given at the same time as other vaccines. 3. Some people should not get this vaccine  A person who has ever had a life-threatening allergic reaction after a previous dose of any diphtheria, tetanus or pertussis containing vaccine, OR has a severe allergy to any part of this vaccine, should not get Tdap vaccine. Tell the person giving the vaccine about any severe allergies.  Anyone who had coma or long repeated seizures within 7 days after a childhood dose of DTP or DTaP, or a previous dose of Tdap, should not get Tdap, unless a cause other than the  vaccine was found. They can still get Td.  Talk to your doctor if you: ? have seizures or another nervous system problem, ? had severe pain or swelling after any vaccine containing diphtheria, tetanus or pertussis, ? ever had a condition called Guillain-Barr Syndrome (GBS), ? aren't feeling well on the day the shot is scheduled. 4. Risks With any medicine, including vaccines, there is a chance of side effects. These are usually mild and go away on their own. Serious reactions are also possible but are rare. Most people who get Tdap vaccine do not have any problems with it. Mild problems following Tdap (Did not interfere with activities)  Pain where the shot was given (about 3 in 4 adolescents or 2 in 3 adults)  Redness or swelling where the shot was given (about 1 person in 5)  Mild fever of at least 100.43F (up to about 1 in 25 adolescents or 1 in 100 adults)  Headache (about 3 or 4 people in 10)  Tiredness (about 1 person in 3 or 4)  Nausea, vomiting, diarrhea, stomach ache (up to 1 in 4 adolescents or 1 in 10 adults)  Chills, sore joints (about 1 person in 10)  Body aches (about 1 person in 3 or 4)  Rash, swollen glands (uncommon) Moderate problems following Tdap (Interfered with activities, but did not require medical attention)  Pain where the shot was given (up to 1 in 5 or 6)  Redness or swelling where the shot was given (up to about 1 in 16 adolescents or 1 in 12 adults)  Fever over 102F (about 1 in 100 adolescents or 1 in 250 adults)  Headache (about 1 in 7 adolescents or 1 in 10 adults)  Nausea, vomiting, diarrhea, stomach ache (up to 1 or 3 people in 100)  Swelling of the entire arm where the shot was given (up to about 1 in 500). Severe problems following Tdap (Unable to perform usual activities; required medical attention)  Swelling, severe pain, bleeding and redness in the arm where the shot was given (rare). Problems that could happen after any  vaccine:  People sometimes faint after a medical procedure, including vaccination. Sitting or lying down for about 15 minutes can help prevent fainting, and injuries caused by a fall. Tell your doctor if you feel dizzy, or have vision changes or ringing in the ears.  Some people get severe pain in the shoulder and have difficulty moving the arm where a shot was given. This happens very rarely.  Any medication can cause a severe allergic reaction. Such reactions from a vaccine are very rare, estimated at fewer than 1 in a million doses, and would happen within a few minutes to a few hours after the vaccination. As with any medicine, there is a very remote chance of a vaccine causing a serious injury or death. The safety of vaccines is always being monitored. For more information, visit: http://www.aguilar.org/ 5. What if there is a serious problem? What should I look for?  Look for anything that concerns you, such as signs of a severe allergic reaction, very high fever, or unusual behavior. Signs of a severe allergic reaction can include hives, swelling of the face and throat, difficulty breathing, a fast heartbeat, dizziness, and weakness. These would usually start a few minutes to a few hours after the vaccination. What should I do?  If you think it is a severe allergic reaction or other emergency that can't wait, call 9-1-1 or get the person to the nearest hospital. Otherwise, call your doctor.  Afterward, the reaction should be reported to the Vaccine Adverse Event Reporting System (VAERS). Your doctor might file this report, or you can do it yourself through the VAERS web site at www.vaers.SamedayNews.es, or by calling 617-752-7565. VAERS does not give medical advice. 6. The National Vaccine Injury Compensation Program The [employer] Vaccine Injury Compensation Program (VICP) is a federal program that was created to compensate people who may have been injured by certain vaccines. Persons who  believe they may have been injured by a vaccine can learn about the program and about filing a claim by calling 205-454-5360 or visiting the [employer] website at GoldCloset.com.ee. There is a time limit to file a claim for compensation. 7. How can I learn more?  Ask your doctor. He or she can give you the vaccine package insert or suggest other sources of information.  Call your local or state health department.  Contact the Centers for Disease Control and Prevention (CDC): ? Call 385-185-2719 (1-800-CDC-INFO) or ? Visit CDC's website at http://hunter.com/  Vaccine Information Statement Tdap Vaccine (11/26/2013) This information is not intended to replace advice given to you by your health care provider. Make sure you discuss any questions you have with your health care provider. Document Released: 03/20/2012 Document Revised: 05/07/2018 Document Reviewed: 05/07/2018 Elsevier Interactive Patient Education  [employer].

## 2019-05-22 LAB — CBC WITH DIFFERENTIAL/PLATELET
Basophils Absolute: 0 10*3/uL (ref 0.0–0.2)
Basos: 1 %
EOS (ABSOLUTE): 0.1 10*3/uL (ref 0.0–0.4)
Eos: 1 %
Hematocrit: 34.9 % (ref 34.0–46.6)
Hemoglobin: 10.9 g/dL — ABNORMAL LOW (ref 11.1–15.9)
Immature Grans (Abs): 0 10*3/uL (ref 0.0–0.1)
Immature Granulocytes: 0 %
Lymphocytes Absolute: 2.2 10*3/uL (ref 0.7–3.1)
Lymphs: 25 %
MCH: 28 pg (ref 26.6–33.0)
MCHC: 31.2 g/dL — ABNORMAL LOW (ref 31.5–35.7)
MCV: 90 fL (ref 79–97)
Monocytes Absolute: 0.6 10*3/uL (ref 0.1–0.9)
Monocytes: 7 %
Neutrophils Absolute: 5.7 10*3/uL (ref 1.4–7.0)
Neutrophils: 66 %
Platelets: 294 10*3/uL (ref 150–450)
RBC: 3.89 x10E6/uL (ref 3.77–5.28)
RDW: 13.9 % (ref 11.7–15.4)
WBC: 8.7 10*3/uL (ref 3.4–10.8)

## 2019-05-22 LAB — COMPREHENSIVE METABOLIC PANEL
ALT: 12 IU/L (ref 0–32)
AST: 11 IU/L (ref 0–40)
Albumin/Globulin Ratio: 1.1 — ABNORMAL LOW (ref 1.2–2.2)
Albumin: 3.7 g/dL — ABNORMAL LOW (ref 3.8–4.8)
Alkaline Phosphatase: 72 IU/L (ref 39–117)
BUN/Creatinine Ratio: 18 (ref 12–28)
BUN: 16 mg/dL (ref 8–27)
Bilirubin Total: <0.2 mg/dL (ref 0.0–1.2)
CO2: 29 mmol/L (ref 20–29)
Calcium: 9.1 mg/dL (ref 8.7–10.3)
Chloride: 99 mmol/L (ref 96–106)
Creatinine, Ser: 0.88 mg/dL (ref 0.57–1.00)
GFR calc Af Amer: 80 mL/min/{1.73_m2} (ref >59)
GFR calc non Af Amer: 70 mL/min/{1.73_m2} (ref >59)
Globulin, Total: 3.4 g/dL (ref 1.5–4.5)
Glucose: 77 mg/dL (ref 65–99)
Potassium: 3.7 mmol/L (ref 3.5–5.2)
Sodium: 141 mmol/L (ref 134–144)
Total Protein: 7.1 g/dL (ref 6.0–8.5)

## 2019-05-22 LAB — LIPID PANEL W/O CHOL/HDL RATIO
Cholesterol, Total: 214 mg/dL — ABNORMAL HIGH (ref 100–199)
HDL: 47 mg/dL (ref >39)
LDL Calculated: 135 mg/dL — ABNORMAL HIGH (ref 0–99)
Triglycerides: 159 mg/dL — ABNORMAL HIGH (ref 0–149)
VLDL Cholesterol Cal: 32 mg/dL (ref 5–40)

## 2019-05-22 LAB — HIV ANTIBODY (ROUTINE TESTING W REFLEX): HIV Screen 4th Generation wRfx: NONREACTIVE

## 2019-05-22 LAB — HEPATITIS C ANTIBODY: Hep C Virus Ab: <0.1 s/co ratio (ref 0.0–0.9)

## 2019-05-22 LAB — TSH: TSH: 41.5 u[IU]/mL — ABNORMAL HIGH (ref 0.450–4.500)

## 2019-05-29 ENCOUNTER — Telehealth: Payer: Self-pay | Admitting: Family Medicine

## 2019-05-29 MED ORDER — LEVOTHYROXINE SODIUM 175 MCG PO TABS: 175.0000 ug | ORAL_TABLET | Freq: Every day | ORAL | 2 refills | Status: DC

## 2019-05-29 NOTE — Telephone Encounter (Signed)
Please let Wendy Marsh know that her labs look good, but her thyroid is up at 29- so I think she needs to restart her thyroid medicine. I've sent a refill to her pharmacy and I'd like her to come in in 6 weeks to recheck it (order in). Thanks!

## 2019-05-29 NOTE — Telephone Encounter (Signed)
Patient notified

## 2019-05-31 ENCOUNTER — Other Ambulatory Visit: Payer: Self-pay

## 2019-06-03 ENCOUNTER — Inpatient Hospital Stay: Payer: PRIVATE HEALTH INSURANCE | Attending: Oncology

## 2019-06-03 ENCOUNTER — Other Ambulatory Visit: Payer: Self-pay

## 2019-06-03 DIAGNOSIS — Z452 Encounter for adjustment and management of vascular access device: Secondary | ICD-10-CM | POA: Insufficient documentation

## 2019-06-03 DIAGNOSIS — C50511 Malignant neoplasm of lower-outer quadrant of right female breast: Secondary | ICD-10-CM | POA: Diagnosis present

## 2019-06-03 DIAGNOSIS — Z95828 Presence of other vascular implants and grafts: Secondary | ICD-10-CM

## 2019-06-03 MED ORDER — HEPARIN SOD (PORK) LOCK FLUSH 100 UNIT/ML IV SOLN
500.0000 [IU] | Freq: Once | INTRAVENOUS | Status: AC
  Administered 2019-06-03: 500 [IU] via INTRAVENOUS

## 2019-06-03 MED ORDER — SODIUM CHLORIDE 0.9% FLUSH
10.0000 mL | Freq: Once | INTRAVENOUS | Status: AC
  Administered 2019-06-03: 10 mL via INTRAVENOUS
  Filled 2019-06-03: qty 10

## 2019-06-07 ENCOUNTER — Ambulatory Visit: Payer: PRIVATE HEALTH INSURANCE | Admitting: Oncology

## 2019-06-07 ENCOUNTER — Other Ambulatory Visit: Payer: PRIVATE HEALTH INSURANCE

## 2019-06-07 NOTE — Progress Notes (Signed)
Patient is coming in for follow up she is doing well.  

## 2019-06-11 ENCOUNTER — Encounter: Payer: Self-pay | Admitting: Oncology

## 2019-06-11 ENCOUNTER — Inpatient Hospital Stay: Payer: PRIVATE HEALTH INSURANCE | Attending: Oncology

## 2019-06-11 ENCOUNTER — Other Ambulatory Visit: Payer: Self-pay

## 2019-06-11 ENCOUNTER — Inpatient Hospital Stay (HOSPITAL_BASED_OUTPATIENT_CLINIC_OR_DEPARTMENT_OTHER): Payer: PRIVATE HEALTH INSURANCE | Admitting: Oncology

## 2019-06-11 VITALS — BP 132/60 | HR 66 | Temp 98.4°F | Resp 18 | Wt 302.7 lb

## 2019-06-11 DIAGNOSIS — Z95828 Presence of other vascular implants and grafts: Secondary | ICD-10-CM

## 2019-06-11 DIAGNOSIS — C50919 Malignant neoplasm of unspecified site of unspecified female breast: Secondary | ICD-10-CM

## 2019-06-11 DIAGNOSIS — C50511 Malignant neoplasm of lower-outer quadrant of right female breast: Secondary | ICD-10-CM | POA: Insufficient documentation

## 2019-06-11 DIAGNOSIS — I4891 Unspecified atrial fibrillation: Secondary | ICD-10-CM | POA: Insufficient documentation

## 2019-06-11 DIAGNOSIS — E876 Hypokalemia: Secondary | ICD-10-CM | POA: Diagnosis not present

## 2019-06-11 DIAGNOSIS — D649 Anemia, unspecified: Secondary | ICD-10-CM | POA: Diagnosis not present

## 2019-06-11 DIAGNOSIS — E039 Hypothyroidism, unspecified: Secondary | ICD-10-CM | POA: Insufficient documentation

## 2019-06-11 DIAGNOSIS — I501 Left ventricular failure: Secondary | ICD-10-CM

## 2019-06-11 DIAGNOSIS — Z171 Estrogen receptor negative status [ER-]: Secondary | ICD-10-CM | POA: Insufficient documentation

## 2019-06-11 DIAGNOSIS — Z803 Family history of malignant neoplasm of breast: Secondary | ICD-10-CM | POA: Insufficient documentation

## 2019-06-11 LAB — COMPREHENSIVE METABOLIC PANEL
ALT: 19 U/L (ref 0–44)
AST: 19 U/L (ref 15–41)
Albumin: 3.8 g/dL (ref 3.5–5.0)
Alkaline Phosphatase: 67 U/L (ref 38–126)
Anion gap: 11 (ref 5–15)
BUN: 26 mg/dL — ABNORMAL HIGH (ref 8–23)
CO2: 29 mmol/L (ref 22–32)
Calcium: 9.2 mg/dL (ref 8.9–10.3)
Chloride: 100 mmol/L (ref 98–111)
Creatinine, Ser: 0.9 mg/dL (ref 0.44–1.00)
GFR calc Af Amer: >60 mL/min (ref >60)
GFR calc non Af Amer: >60 mL/min (ref >60)
Glucose, Bld: 104 mg/dL — ABNORMAL HIGH (ref 70–99)
Potassium: 3.4 mmol/L — ABNORMAL LOW (ref 3.5–5.1)
Sodium: 140 mmol/L (ref 135–145)
Total Bilirubin: 0.6 mg/dL (ref 0.3–1.2)
Total Protein: 7.2 g/dL (ref 6.5–8.1)

## 2019-06-11 LAB — CBC WITH DIFFERENTIAL/PLATELET
Abs Immature Granulocytes: 0.02 10*3/uL (ref 0.00–0.07)
Basophils Absolute: 0 10*3/uL (ref 0.0–0.1)
Basophils Relative: 0 %
Eosinophils Absolute: 0.3 10*3/uL (ref 0.0–0.5)
Eosinophils Relative: 3 %
HCT: 36.5 % (ref 36.0–46.0)
Hemoglobin: 11.6 g/dL — ABNORMAL LOW (ref 12.0–15.0)
Immature Granulocytes: 0 %
Lymphocytes Relative: 20 %
Lymphs Abs: 1.6 10*3/uL (ref 0.7–4.0)
MCH: 27.9 pg (ref 26.0–34.0)
MCHC: 31.8 g/dL (ref 30.0–36.0)
MCV: 87.7 fL (ref 80.0–100.0)
Monocytes Absolute: 0.7 10*3/uL (ref 0.1–1.0)
Monocytes Relative: 8 %
Neutro Abs: 5.7 10*3/uL (ref 1.7–7.7)
Neutrophils Relative %: 69 %
Platelets: 274 10*3/uL (ref 150–400)
RBC: 4.16 MIL/uL (ref 3.87–5.11)
RDW: 14.9 % (ref 11.5–15.5)
WBC: 8.2 10*3/uL (ref 4.0–10.5)
nRBC: 0 % (ref 0.0–0.2)

## 2019-06-11 NOTE — Progress Notes (Signed)
Pt in for follow up, denies any concerns today. 

## 2019-06-11 NOTE — Progress Notes (Signed)
Hematology/Oncology Follow up note [facility] Telephone:(336) [phone] [phone]   Patient Care Team: Valerie Roys, DO as PCP - General (Family Medicine) Thornton Park, MD as Referring Physician (Orthopedic Surgery) Rico Junker, RN as Oncology Nurse Navigator Byrnett, Forest Gleason, MD (General Surgery) Earlie Server, MD as Medical Oncologist (Medical Oncology) Noreene Filbert, MD as Referring Physician (Radiation Oncology)  REASON FOR VISIT Follow up for treatment of Breast cancer, assess of chemotherapy toxicity.  HISTORY OF PRESENTING ILLNESS:  Wendy Marsh is a  63 y.o.  female with PMH listed below who was referred to me for evaluation of breast cancer  04/28/2019 diagnostic mammogram Mammogram showed right breast 6 o'clock axis breast mass, 2.9 cm corresponding to area of concern.  cT2N0  Breast mass was biopsied, and pathology showed invasive mammary carcinoma.  Grade 3 , ER/PR- HER2 -.  LVI suspicious, favor present.   #  baseline MUGA testing done and reviewed systolic function depression, LVEF 45%. Given that Adriamycin has cardiac toxicity, potentially can lead to irreversible cardiomyopathy, I discussed with patient about using non-anthracycline-containing regimen. # 05/23/2019- 09/11/2018  S/p 6 cycle of Docetaxel 7m/m2 and Carboplatin every 3 weeks [ data from PLealmanet al 2018 Clin Cancer Res, Pathological Response and Survival in Triple-Negative Breast Cancer Following Neoadjuvant Carboplatin plus Docetaxel.Stage I-III TNBC patients, Neoadjuvant carboplatin (AUC6) plus docetaxel (75 mg/m2) every 21 days  6 cycles. pCR was 55% and Residual cancer burden (RCB) were 13%. Excellent Three-year RFS was 90% in patients with pCR and 66% in those without pCR]    # 10/29/2018 underwent right breast lumpectomy with  excisional biopsy of the right axillary deep sentinel lymph nodes. Pathology showed no residual malignancy  identified.  Previous biopsy site related changes.  Changes consistent with the tumor regression/evaluation.  Margins negative for tumor.  No metastatic carcinoma identified in sentinel lymph node. Right axilla contents excision showed friable adipose tissue with calcifications.  Adjacent lymph nodes with fibrosis, negative for tumor.  Second lymph node with minimal fibrosis.  Negative for tumor.ypT0, ypN0- complete pathological remission.    # # Family history of breast cancer plus personal history of triple negative breast cancer. Testing did not reveal a pathogenic mutation in any of the genes analyzed.  BRCA negative.  VUS was detected. Does not change management.    INTERVAL HISTORY CRENAD JENNIGESis a 63y.o. female who has above history reviewed by me today presents for discussion of pathology and further management plan for triple negative breast cancer. Patient reports no new complaints today.  Doing well. No concerns of her breast. Occasionally she feels "twinges to right breast breast. Chronic arthralgia, no change. Denies any fever, cough, chills, shortness of breath, chest pain or abdominal pain.    .  Review of Systems  Constitutional: Negative for chills, fever, malaise/fatigue and weight loss.  HENT: Negative for nosebleeds and sore throat.   Eyes: Negative for double vision, photophobia and redness.  Respiratory: Negative for cough, shortness of breath and wheezing.   Cardiovascular: Negative for chest pain, palpitations, orthopnea and leg swelling.  Gastrointestinal: Negative for abdominal pain, blood in stool, nausea and vomiting.  Genitourinary: Negative for dysuria.  Musculoskeletal: Positive for joint pain. Negative for back pain, myalgias and neck pain.       Chronic right knee pain.  Skin: Negative for itching and rash.  Neurological: Negative for dizziness, tingling and tremors.  Endo/Heme/Allergies: Negative for environmental allergies. Does not  bruise/bleed easily.  Psychiatric/Behavioral: Negative for depression and hallucinations.    MEDICAL HISTORY:  Past Medical History:  Diagnosis Date  . Arthritis   . Atrial fibrillation ([place])   . Breast cancer ([place]) 04/2018   right breast  . Dysrhythmia    afib  . Hyperlipidemia   . Hypertension   . Hypotension due to drugs 05/30/2018  . Hypothyroidism   . Lumbago   . Osteoporosis   . Personal history of chemotherapy   . Thyroid disease     SURGICAL HISTORY: Past Surgical History:  Procedure Laterality Date  . BREAST BIOPSY Right 05/02/2018   Invasive mammary carcinoma, grade 3, neo adj chemo  . BREAST LUMPECTOMY Right 10/29/2018   NO RESIDUAL MALIGNANCY IDENTIFIED.   Marland Kitchen BREAST LUMPECTOMY WITH NEEDLE LOCALIZATION AND AXILLARY SENTINEL LYMPH NODE BX Right 10/29/2018   Procedure: BREAST LUMPECTOMY WITH NEEDLE LOCALIZATION AND SENTINEL LYMPH NODE BX;  Surgeon: Vickie Epley, MD;  Location: [facility] ORS;  Service: General;  Laterality: Right;  . JOINT REPLACEMENT Left    tkr  . PORTACATH PLACEMENT Right 05/11/2018   Procedure: INSERTION  I.J PORT-A-CATH;  Surgeon: Robert Bellow, MD;  Location: [facility] ORS;  Service: General;  Laterality: Right;  . REPLACEMENT TOTAL KNEE      SOCIAL HISTORY: Social History   Socioeconomic History  . Marital status: Married    Spouse name: jerry  . Number of children: 3  . Years of education: Not on file  . Highest education level: Some college, no degree  Occupational History  . Not on file  Social Needs  . Financial resource strain: Somewhat hard  . Food insecurity    Worry: Never true    Inability: Never true  . Transportation needs    Medical: No    Non-medical: No  Tobacco Use  . Smoking status: Never Smoker  . Smokeless tobacco: Never Used  Substance and Sexual Activity  . Alcohol use: No  . Drug use: No  . Sexual activity: Yes    Birth control/protection: Post-menopausal  Lifestyle  . Physical activity    Days per  week: 0 days    Minutes per session: 0 min  . Stress: Only a little  Relationships  . Social connections    Talks on phone: More than three times a week    Gets together: Twice a week    Attends religious service: Never    Active member of club or organization: No    Attends meetings of clubs or organizations: Not on file    Relationship status: Married  . Intimate partner violence    Fear of current or ex partner: No    Emotionally abused: No    Physically abused: No    Forced sexual activity: No  Other Topics Concern  . Not on file  Social History Narrative  . Not on file    FAMILY HISTORY: Family History  Problem Relation Age of Onset  . Diabetes Mother   . Congestive Heart Failure Mother   . Breast cancer Maternal Grandmother        dx 16s; deceased 66s  . Cancer Maternal Uncle 81       deceased 41s  . Other Father        No information on father or paternal relatives  . Breast cancer Other 20       paternal half-sister; also esophageal ca at 37; currently 15  . Diabetes Other   . Hypertension Other   .  Kidney disease Other   . Thyroid disease Other     ALLERGIES:  is allergic to gabapentin; morphine and related; peanut-containing drug products; and tape.  MEDICATIONS:  Current Outpatient Medications  Medication Sig Dispense Refill  . acetaminophen (TYLENOL) 500 MG tablet Take 500 mg by mouth every 6 (six) hours as needed.    Marland Kitchen EPINEPHrine 0.3 mg/0.3 mL IJ SOAJ injection Inject 0.3 mg into the muscle as needed for anaphylaxis.   12  . flecainide (TAMBOCOR) 50 MG tablet Take 50 mg by mouth 2 (two) times daily.     Marland Kitchen levothyroxine (SYNTHROID) 175 MCG tablet Take 1 tablet (175 mcg total) by mouth daily before breakfast. 30 tablet 2  . lidocaine-prilocaine (EMLA) cream APPLY TO AFFECTED AREA ONCE AS DIRECTED    . lisinopril-hydrochlorothiazide (ZESTORETIC) 20-25 MG tablet Take 2 tablets by mouth daily. 180 tablet 1  . metoprolol succinate (TOPROL-XL) 50 MG 24 hr  tablet Take 1 tablet (50 mg total) by mouth daily. Take with or immediately following a meal. 90 tablet 1  . naproxen sodium (ALEVE) 220 MG tablet Take 440 mg by mouth daily as needed (for pain or headache).    . Nutritional Supplements (JUICE PLUS FIBRE PO) Take 2-4 each by mouth daily.     . potassium chloride SA (K-DUR,KLOR-CON) 20 MEQ tablet Take 1 tablet (20 mEq total) by mouth daily. 90 tablet 1   No current facility-administered medications for this visit.      PHYSICAL EXAMINATION: ECOG PERFORMANCE STATUS: 1 - Symptomatic but completely ambulatory Vitals:   06/11/19 1428  BP: 132/60  Pulse: 66  Resp: 18  Temp: 98.4 F (36.9 C)  SpO2: 99%   Filed Weights   06/11/19 1428  Weight: (!) 302 lb 11.2 oz (137.3 kg)    Physical Exam Constitutional:      General: She is not in acute distress.    Appearance: She is obese.     Comments: Obese, walks with a walker  HENT:     Head: Normocephalic and atraumatic.  Eyes:     General: No scleral icterus.    Pupils: Pupils are equal, round, and reactive to light.  Neck:     Musculoskeletal: Normal range of motion and neck supple.  Cardiovascular:     Rate and Rhythm: Normal rate and regular rhythm.     Heart sounds: Normal heart sounds.  Pulmonary:     Effort: Pulmonary effort is normal. No respiratory distress.     Breath sounds: No wheezing.  Abdominal:     General: Bowel sounds are normal. There is no distension.     Palpations: Abdomen is soft. There is no mass.     Tenderness: There is no abdominal tenderness.  Musculoskeletal: Normal range of motion.        General: No deformity.     Comments: Chronic lower extremity edema.  1+  Skin:    General: Skin is warm and dry.     Findings: No erythema or rash.  Neurological:     Mental Status: She is alert and oriented to person, place, and time.     Cranial Nerves: No cranial nerve deficit.     Coordination: Coordination normal.  Psychiatric:        Behavior: Behavior  normal.        Thought Content: Thought content normal.   Breast exam deferred as patient was just examined by Dr. Rosana Hoes on 11/08/2018.   LABORATORY DATA:  I have reviewed the data  as listed Lab Results  Component Value Date   WBC 8.2 06/11/2019   HGB 11.6 (L) 06/11/2019   HCT 36.5 06/11/2019   MCV 87.7 06/11/2019   PLT 274 06/11/2019   Recent Labs    03/11/19 1253 05/21/19 1009 06/11/19 1315  NA 140 141 140  K 3.4* 3.7 3.4*  CL 100 99 100  CO2 _0 GLUCOSE 91 77 104*  BUN 17 16 26*  CREATININE 0.88 0.88 0.90  CALCIUM 8.7* 9.1 9.2  GFRNONAA >60 70 >60  GFRAA >60 80 >60  PROT 7.3 7.1 7.2  ALBUMIN 3.5 3.7* 3.8  AST _1 ALT _2 ALKPHOS 67 72 67  BILITOT 0.5 <0.2 0.6   RADIOGRAPHIC STUDIES: I have personally reviewed the radiological images as listed and agreed with the findings in the report. NM Cardiac Muga rest: Calculated LEFT ventricular ejection fraction equals 45%   ASSESSMENT & PLAN:  1. 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[place])   2. Port-A-Cath in place   3. Normocytic anemia   Cancer Staging Malignant neoplasm of lower-outer quadrant of right female breast [facility]) Staging form: Breast, AJCC 8th Edition - Clinical stage from 05/08/2018: Stage IIB (cT2, cN0, cM0, G3, ER-, PR-, HER2-) - Signed by Earlie Server, MD on 05/08/2018 - Pathologic stage from 11/14/2018: No Stage Recommended (ypT0, pN0, cM0, ER-, PR-, HER2-) - Signed by Earlie Server, MD on 11/14/2018 Labs are reviewed and discussed with patient.  #Triple negative breast cancer,  Status post neoadjuvant chemotherapy with 6 cycles of Doxetaxol and carboplatin. .  Status post right lumpectomy with excisional biopsy of sentinel lymph node. S/p radiation.   Complete pathological remission,  ypT0 ypN0 Continue surveillance. History and physical every 3 to 6 months for the first 5 years then annually. Annual mammogram every 12 months. 04/29/2019 diagnostic mammogram was independent reviewed and  discussed with patient.  No mammographic evidence of breast cancer recurrence . #Chronic hypo-kalemia, potassium level is 3.4.  Encourage patient to eat potassium rich food.  #Port-A-Cath in place, continue port flushes every 6 weeks #Normocytic anemia, hemoglobin 11.6, improved continue to monitor.  Orders Placed This Encounter  Procedures  . CBC with Differential/Platelet    Standing Status:   Future    Standing Expiration Date:   06/10/2020  . Comprehensive metabolic panel    Standing Status:   Future    Standing Expiration Date:   06/10/2020    Return of visit: 3 months with repeat CBC and CMP, breast cancer tumor markers.Earlie Server, MD, PhD 06/11/2019

## 2019-06-12 LAB — CANCER ANTIGEN 15-3: CA 15-3: 8.2 U/mL (ref 0.0–25.0)

## 2019-06-12 LAB — CANCER ANTIGEN 27.29: CA 27.29: 4.7 U/mL (ref 0.0–38.6)

## 2019-07-15 ENCOUNTER — Inpatient Hospital Stay: Payer: PRIVATE HEALTH INSURANCE | Attending: Oncology

## 2019-07-15 ENCOUNTER — Other Ambulatory Visit: Payer: Self-pay

## 2019-07-15 DIAGNOSIS — Z452 Encounter for adjustment and management of vascular access device: Secondary | ICD-10-CM | POA: Diagnosis not present

## 2019-07-15 DIAGNOSIS — Z171 Estrogen receptor negative status [ER-]: Secondary | ICD-10-CM | POA: Diagnosis not present

## 2019-07-15 DIAGNOSIS — C50511 Malignant neoplasm of lower-outer quadrant of right female breast: Secondary | ICD-10-CM | POA: Insufficient documentation

## 2019-07-15 DIAGNOSIS — Z95828 Presence of other vascular implants and grafts: Secondary | ICD-10-CM

## 2019-07-15 MED ORDER — HEPARIN SOD (PORK) LOCK FLUSH 100 UNIT/ML IV SOLN
500.0000 [IU] | Freq: Once | INTRAVENOUS | Status: AC
  Administered 2019-07-15: 500 [IU] via INTRAVENOUS

## 2019-07-15 MED ORDER — SODIUM CHLORIDE 0.9% FLUSH
10.0000 mL | Freq: Once | INTRAVENOUS | Status: AC
  Administered 2019-07-15: 10 mL via INTRAVENOUS
  Filled 2019-07-15: qty 10

## 2019-07-29 IMAGING — MG DIGITAL DIAGNOSTIC UNILATERAL RIGHT MAMMOGRAM WITH TOMO AND CAD
6 of 10 series · 6 of 30 positions shown · non-contrast
Comparison: Previous exam(s).

CLINICAL DATA: Right breast 6 o'clock invasive mammary carcinoma,
grade 3, diagnosed in April 2018. The patient has undergone
neoadjuvant chemotherapy.

EXAM:
DIGITAL DIAGNOSTIC RIGHT MAMMOGRAM WITH CAD AND TOMO
ULTRASOUND RIGHT BREAST

[R XCCL synth-2D]
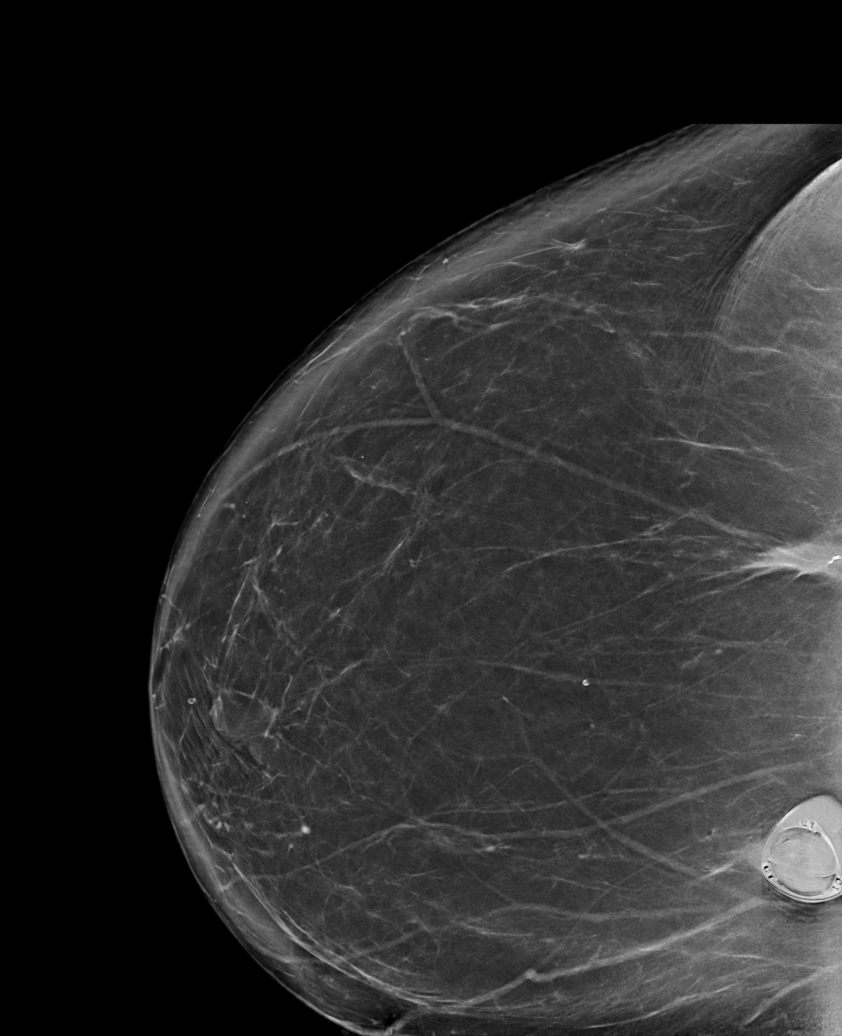

[R CC synth-2D]
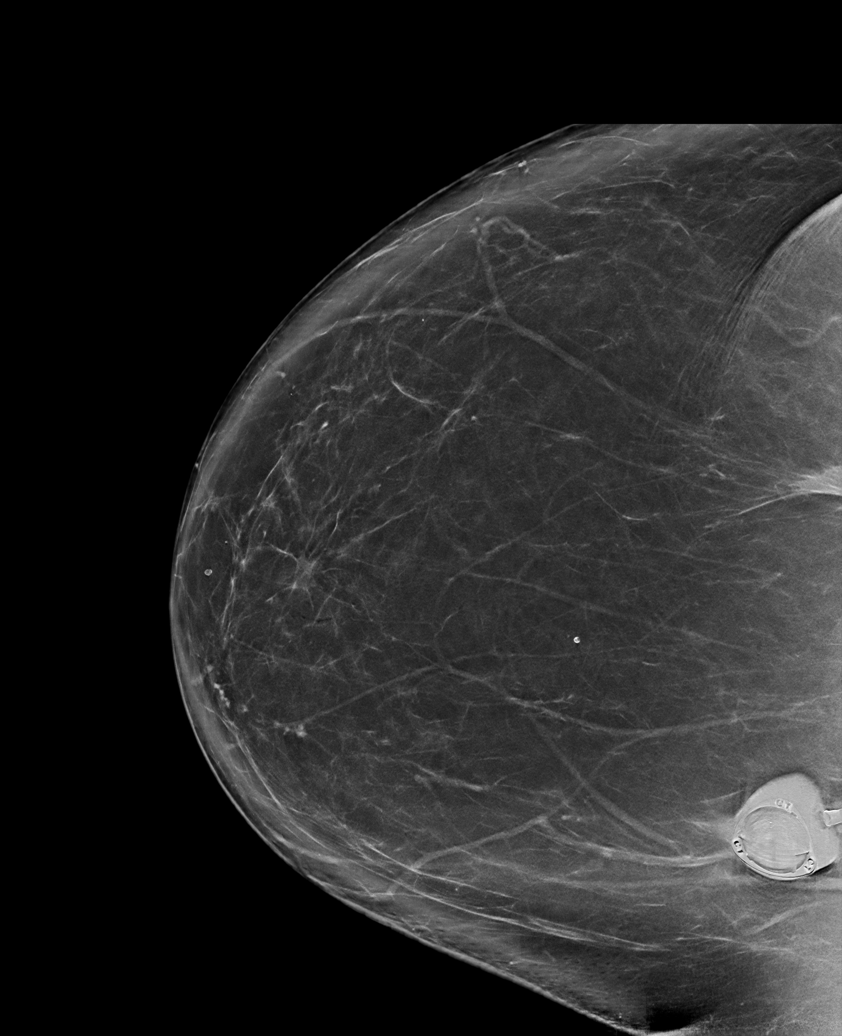

[R MLO synth-2D (1 of 2)]
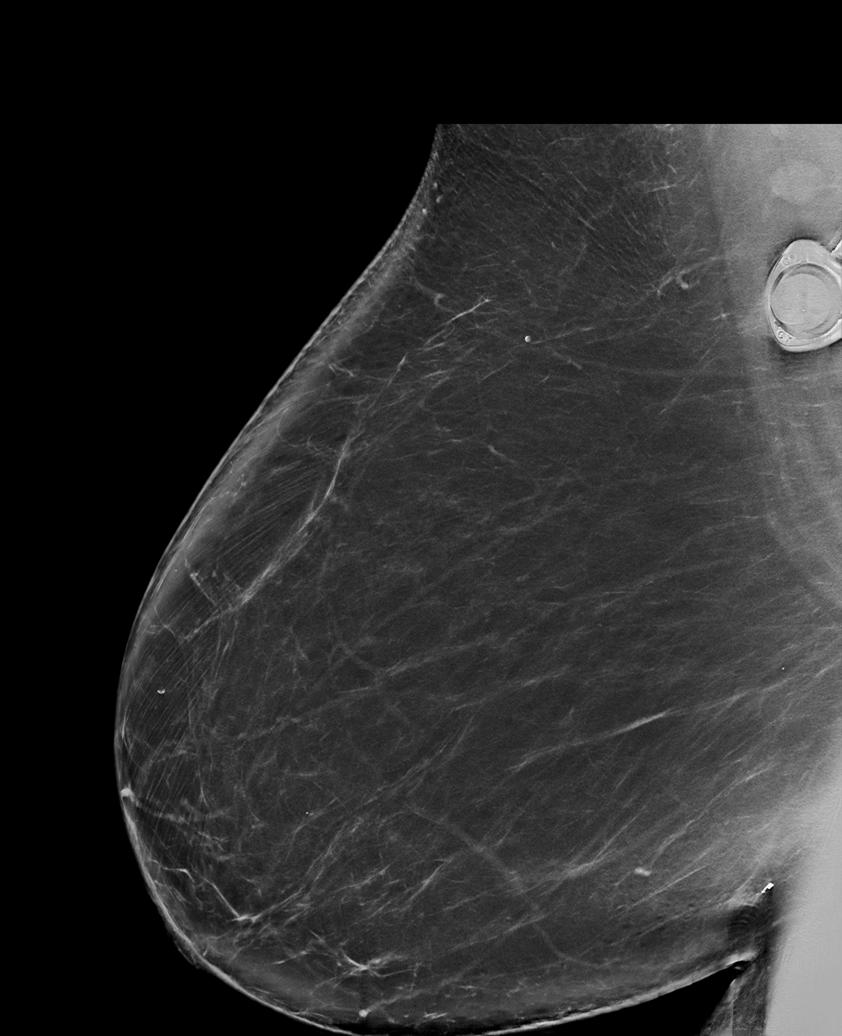

[R LM synth-2D]
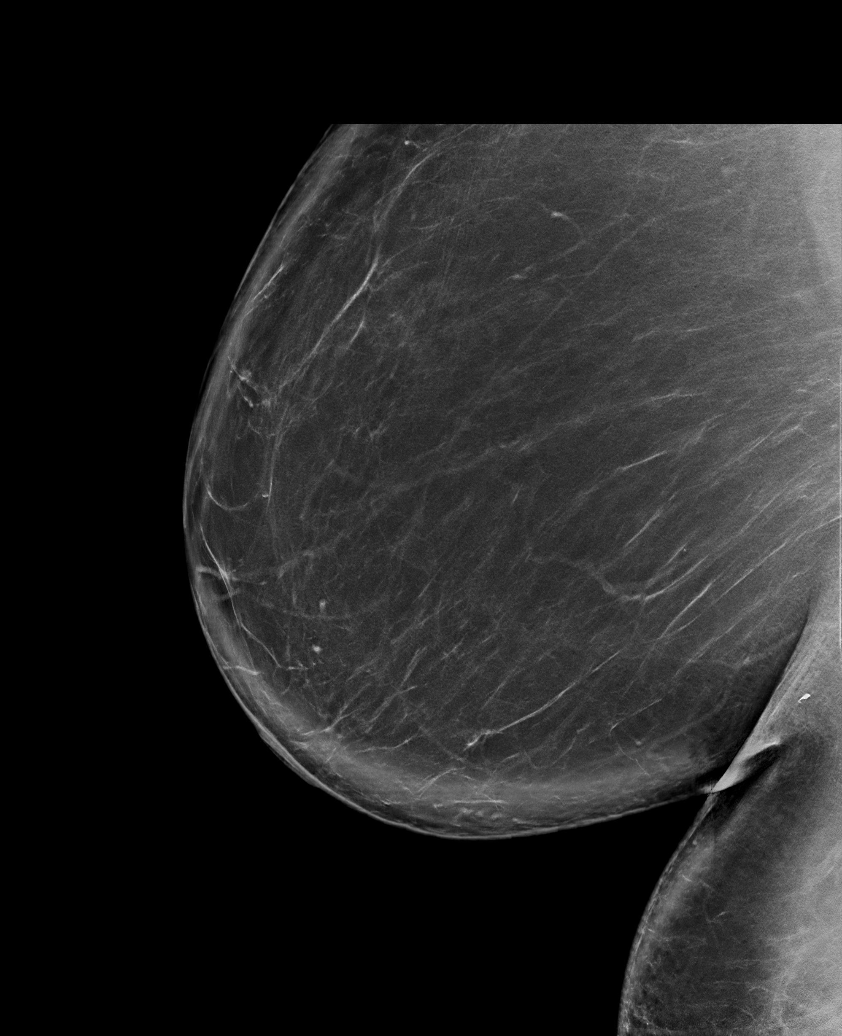

[R MLO synth-2D (2 of 2)]
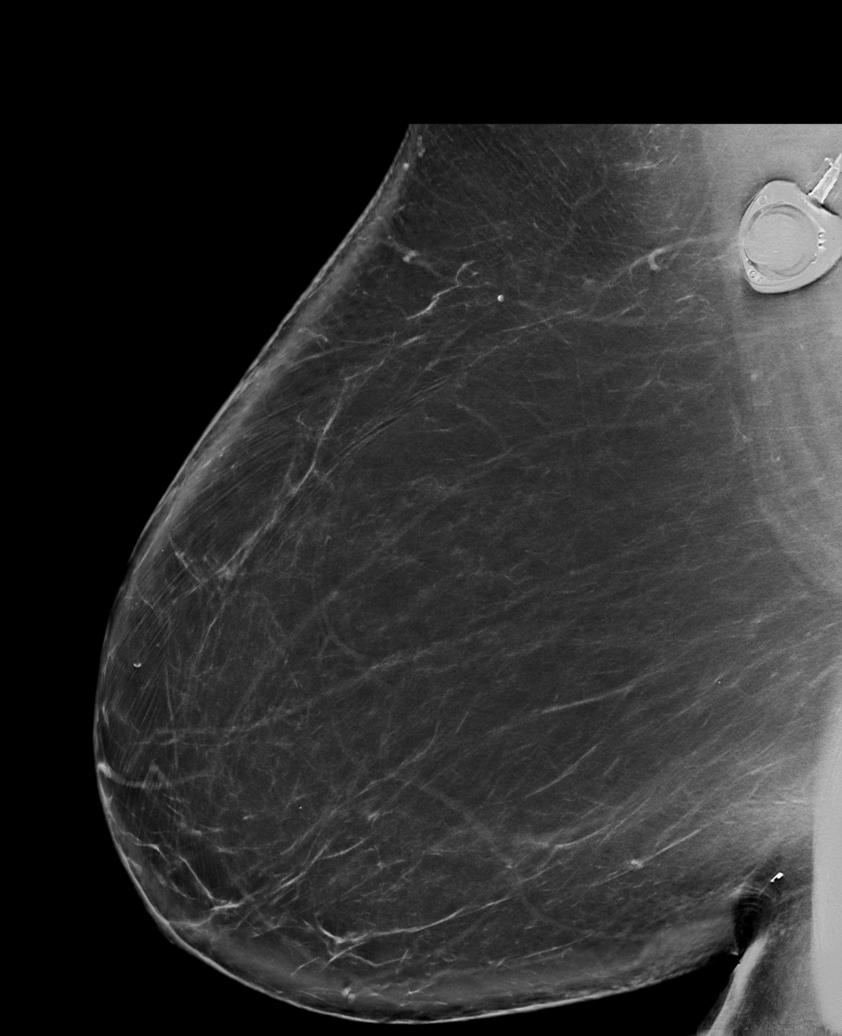

[R MLO tomo · tomo slice 58/115.0]
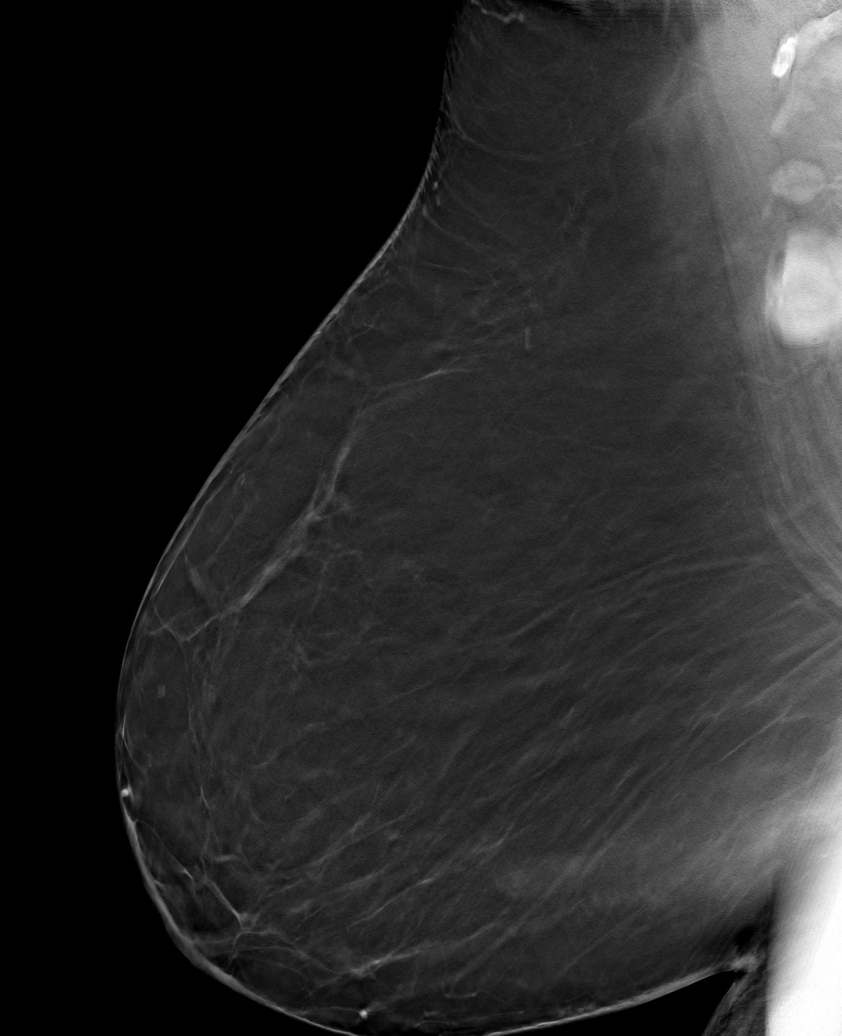

[6 of 30 positions shown; findings below may reference images not displayed]

ACR Breast Density Category b: There are scattered areas of
fibroglandular density.
FINDINGS: The known malignancy in the right lower central breast presents as a
partially visualized mass measuring approximately 2.3 cm in greatest
dimension mammographically. No other suspicious masses are seen in
the right breast.

Mammographic images were processed with CAD.

On physical exam, the abnormality is difficult to palpate.

Targeted ultrasound is performed, showing right breast 6 o'clock 9
cm from the nipple elongated hypoechoic mass which measures 3.3 by
0.9 by 3.8 cm, prior measurement of 2.7 by 2.9 by 2.8 cm. There is
no evidence of right axillary lymphadenopathy.
IMPRESSION: The known right breast 6 o'clock malignancy has resumed an elongated
shape, and while it measures larger lengthwise-7.9 cm from the prior
measurement of 2.9 cm on the prior ultrasound, the AP dimension has
decreased to 0.9 cm from 2.9 cm.

RECOMMENDATION:
Continue with plan of care for known right breast malignancy.

I have discussed the findings and recommendations with the patient.
Results were also provided in writing at the conclusion of the
visit. If applicable, a reminder letter will be sent to the patient
regarding the next appointment.

BI-RADS CATEGORY  6: Known biopsy-proven malignancy.

## 2019-08-19 ENCOUNTER — Encounter: Payer: Self-pay | Admitting: Radiation Oncology

## 2019-08-19 ENCOUNTER — Other Ambulatory Visit: Payer: Self-pay

## 2019-08-19 ENCOUNTER — Ambulatory Visit
Admission: RE | Admit: 2019-08-19 | Discharge: 2019-08-19 | Disposition: A | Discharge status: Home / Self Care | Payer: PRIVATE HEALTH INSURANCE | Source: Ambulatory Visit | Attending: Radiation Oncology | Admitting: Radiation Oncology

## 2019-08-19 VITALS — BP 166/75 | HR 61 | Temp 96.6°F | Resp 18 | Wt 299.8 lb

## 2019-08-19 DIAGNOSIS — Z853 Personal history of malignant neoplasm of breast: Secondary | ICD-10-CM | POA: Insufficient documentation

## 2019-08-19 DIAGNOSIS — E669 Obesity, unspecified: Secondary | ICD-10-CM | POA: Insufficient documentation

## 2019-08-19 DIAGNOSIS — M109 Gout, unspecified: Secondary | ICD-10-CM | POA: Insufficient documentation

## 2019-08-19 DIAGNOSIS — Z923 Personal history of irradiation: Secondary | ICD-10-CM | POA: Diagnosis not present

## 2019-08-19 DIAGNOSIS — C50919 Malignant neoplasm of unspecified site of unspecified female breast: Secondary | ICD-10-CM

## 2019-08-19 NOTE — Progress Notes (Signed)
Radiation Oncology Follow up Note  Name: Wendy Marsh   Date:   08/19/2019 MRN:  [id number] DOB: July 04, 1956    This 63 y.o. female presents to the clinic today for 46-month follow-up status post whole breast radiation to her right breast for stage IIb (T2 N0 M0) triple negative invasive mammary carcinoma patient was treated with hypofractionated partial breast external beam treatment.  REFERRING PROVIDER: Valerie Roys, DO  HPI: Patient is a 63 year old female now about 6 months having completed accelerated partial breast radiation with external beam for stage IIb (T2 N0 M0) triple negative invasive mammary carcinoma patient has extremely large breasts is morbidly obese..  Seen today in routine follow-up she is doing well.  She specifically denies breast tenderness cough or bone pain.  She is having problems with gout at this time is not currently on medication for that although it is improving.  She had mammograms back in July which I have reviewed were BI-RADS 2 benign.  She is currently not on antiestrogen therapy based on the triple negative nature of her breast cancer.  COMPLICATIONS OF TREATMENT: none  FOLLOW UP COMPLIANCE: keeps appointments   PHYSICAL EXAM:  BP (!) 166/75   Pulse 61   Temp (!) 96.6 F (35.9 C)   Resp 18   Wt 299 lb 12.8 oz (136 kg)   LMP 03/17/2009 (Approximate)   BMI 55.73 kg/m  Lungs are clear to A&P cardiac examination essentially unremarkable with regular rate and rhythm. No dominant mass or nodularity is noted in either breast in 2 positions examined. Incision is well-healed. No axillary or supraclavicular adenopathy is appreciated. Cosmetic result is excellent.  Well-developed well-nourished patient in NAD. HEENT reveals PERLA, EOMI, discs not visualized.  Oral cavity is clear. No oral mucosal lesions are identified. Neck is clear without evidence of cervical or supraclavicular adenopathy. Lungs are clear to A&P. Cardiac examination is essentially  unremarkable with regular rate and rhythm without murmur rub or thrill. Abdomen is benign with no organomegaly or masses noted. Motor sensory and DTR levels are equal and symmetric in the upper and lower extremities. Cranial nerves II through XII are grossly intact. Proprioception is intact. No peripheral adenopathy or edema is identified. No motor or sensory levels are noted. Crude visual fields are within normal range.  RADIOLOGY RESULTS: Mammograms reviewed compatible with above-stated findings  PLAN: Present time patient is doing well with no evidence of disease.  I am pleased with her overall progress.  Her gout seems to be improving does not and patient does not desire any medical intervention at this time.  I have asked to see her back in 1 year for follow-up.  Patient knows to call with any concerns.  I would like to take this opportunity to thank you for allowing me to participate in the care of your patient.Noreene Filbert, MD

## 2019-08-26 ENCOUNTER — Other Ambulatory Visit: Payer: Self-pay

## 2019-08-26 ENCOUNTER — Inpatient Hospital Stay: Payer: PRIVATE HEALTH INSURANCE | Attending: Oncology

## 2019-08-26 DIAGNOSIS — C50919 Malignant neoplasm of unspecified site of unspecified female breast: Secondary | ICD-10-CM

## 2019-08-26 DIAGNOSIS — C50511 Malignant neoplasm of lower-outer quadrant of right female breast: Secondary | ICD-10-CM | POA: Insufficient documentation

## 2019-08-26 DIAGNOSIS — E039 Hypothyroidism, unspecified: Secondary | ICD-10-CM

## 2019-08-26 DIAGNOSIS — Z171 Estrogen receptor negative status [ER-]: Secondary | ICD-10-CM | POA: Diagnosis not present

## 2019-08-26 DIAGNOSIS — Z95828 Presence of other vascular implants and grafts: Secondary | ICD-10-CM

## 2019-08-26 MED ORDER — SODIUM CHLORIDE 0.9% FLUSH
10.0000 mL | Freq: Once | INTRAVENOUS | Status: AC
  Administered 2019-08-26: 10 mL via INTRAVENOUS
  Filled 2019-08-26: qty 10

## 2019-08-26 MED ORDER — HEPARIN SOD (PORK) LOCK FLUSH 100 UNIT/ML IV SOLN
500.0000 [IU] | Freq: Once | INTRAVENOUS | Status: AC
  Administered 2019-08-26: 500 [IU] via INTRAVENOUS

## 2019-08-26 NOTE — Telephone Encounter (Signed)
What does she need refills on?

## 2019-08-26 NOTE — Telephone Encounter (Signed)
Patient last seen 05/21/19.

## 2019-08-26 NOTE — Telephone Encounter (Signed)
Levothyroxine, sorry Dr. Wynetta Emery.

## 2019-08-27 NOTE — Telephone Encounter (Signed)
Needs to come in for blood work to see if she's on the right dose.

## 2019-08-27 NOTE — Telephone Encounter (Signed)
Lab appointment scheduled for tomorrow morning.

## 2019-08-28 ENCOUNTER — Other Ambulatory Visit: Payer: PRIVATE HEALTH INSURANCE

## 2019-08-28 ENCOUNTER — Other Ambulatory Visit: Payer: Self-pay

## 2019-08-28 DIAGNOSIS — E039 Hypothyroidism, unspecified: Secondary | ICD-10-CM

## 2019-08-29 LAB — TSH: TSH: 1.61 u[IU]/mL (ref 0.450–4.500)

## 2019-09-02 ENCOUNTER — Telehealth: Payer: Self-pay | Admitting: Family Medicine

## 2019-09-02 MED ORDER — LEVOTHYROXINE SODIUM 175 MCG PO TABS: 175.0000 ug | ORAL_TABLET | Freq: Every day | ORAL | 2 refills | Status: DC

## 2019-09-02 MED ORDER — LEVOTHYROXINE SODIUM 175 MCG PO TABS: 175.0000 ug | ORAL_TABLET | Freq: Every day | ORAL | 3 refills | Status: DC

## 2019-09-02 NOTE — Telephone Encounter (Signed)
Patient notified

## 2019-09-02 NOTE — Telephone Encounter (Signed)
Please let her know that her thyroid came back in the normal range, so I've sent a refill of her medicine to her pharmacy.

## 2019-09-12 ENCOUNTER — Inpatient Hospital Stay: Payer: PRIVATE HEALTH INSURANCE | Attending: Oncology

## 2019-09-12 ENCOUNTER — Other Ambulatory Visit: Payer: Self-pay

## 2019-09-12 ENCOUNTER — Inpatient Hospital Stay (HOSPITAL_BASED_OUTPATIENT_CLINIC_OR_DEPARTMENT_OTHER): Payer: PRIVATE HEALTH INSURANCE | Admitting: Oncology

## 2019-09-12 ENCOUNTER — Encounter: Payer: Self-pay | Admitting: Oncology

## 2019-09-12 VITALS — BP 136/70 | HR 57 | Temp 97.5°F | Resp 18 | Wt 299.0 lb

## 2019-09-12 DIAGNOSIS — Z923 Personal history of irradiation: Secondary | ICD-10-CM | POA: Insufficient documentation

## 2019-09-12 DIAGNOSIS — D649 Anemia, unspecified: Secondary | ICD-10-CM | POA: Diagnosis not present

## 2019-09-12 DIAGNOSIS — Z79899 Other long term (current) drug therapy: Secondary | ICD-10-CM | POA: Diagnosis not present

## 2019-09-12 DIAGNOSIS — I10 Essential (primary) hypertension: Secondary | ICD-10-CM | POA: Insufficient documentation

## 2019-09-12 DIAGNOSIS — Z171 Estrogen receptor negative status [ER-]: Secondary | ICD-10-CM | POA: Insufficient documentation

## 2019-09-12 DIAGNOSIS — E039 Hypothyroidism, unspecified: Secondary | ICD-10-CM | POA: Insufficient documentation

## 2019-09-12 DIAGNOSIS — Z95828 Presence of other vascular implants and grafts: Secondary | ICD-10-CM

## 2019-09-12 DIAGNOSIS — C50511 Malignant neoplasm of lower-outer quadrant of right female breast: Secondary | ICD-10-CM | POA: Insufficient documentation

## 2019-09-12 DIAGNOSIS — C50919 Malignant neoplasm of unspecified site of unspecified female breast: Secondary | ICD-10-CM

## 2019-09-12 DIAGNOSIS — E876 Hypokalemia: Secondary | ICD-10-CM | POA: Insufficient documentation

## 2019-09-12 DIAGNOSIS — I4891 Unspecified atrial fibrillation: Secondary | ICD-10-CM | POA: Insufficient documentation

## 2019-09-12 LAB — CBC WITH DIFFERENTIAL/PLATELET
Abs Immature Granulocytes: 0.03 10*3/uL (ref 0.00–0.07)
Basophils Absolute: 0 10*3/uL (ref 0.0–0.1)
Basophils Relative: 0 %
Eosinophils Absolute: 0.3 10*3/uL (ref 0.0–0.5)
Eosinophils Relative: 4 %
HCT: 35.9 % — ABNORMAL LOW (ref 36.0–46.0)
Hemoglobin: 10.9 g/dL — ABNORMAL LOW (ref 12.0–15.0)
Immature Granulocytes: 0 %
Lymphocytes Relative: 17 %
Lymphs Abs: 1.5 10*3/uL (ref 0.7–4.0)
MCH: 26.7 pg (ref 26.0–34.0)
MCHC: 30.4 g/dL (ref 30.0–36.0)
MCV: 87.8 fL (ref 80.0–100.0)
Monocytes Absolute: 0.5 10*3/uL (ref 0.1–1.0)
Monocytes Relative: 6 %
Neutro Abs: 6.2 10*3/uL (ref 1.7–7.7)
Neutrophils Relative %: 73 %
Platelets: 288 10*3/uL (ref 150–400)
RBC: 4.09 MIL/uL (ref 3.87–5.11)
RDW: 14.9 % (ref 11.5–15.5)
WBC: 8.6 10*3/uL (ref 4.0–10.5)
nRBC: 0 % (ref 0.0–0.2)

## 2019-09-12 LAB — COMPREHENSIVE METABOLIC PANEL
ALT: 15 U/L (ref 0–44)
AST: 18 U/L (ref 15–41)
Albumin: 3.6 g/dL (ref 3.5–5.0)
Alkaline Phosphatase: 72 U/L (ref 38–126)
Anion gap: 12 (ref 5–15)
BUN: 17 mg/dL (ref 8–23)
CO2: 27 mmol/L (ref 22–32)
Calcium: 9 mg/dL (ref 8.9–10.3)
Chloride: 99 mmol/L (ref 98–111)
Creatinine, Ser: 0.85 mg/dL (ref 0.44–1.00)
GFR calc Af Amer: >60 mL/min (ref >60)
GFR calc non Af Amer: >60 mL/min (ref >60)
Glucose, Bld: 95 mg/dL (ref 70–99)
Potassium: 3.3 mmol/L — ABNORMAL LOW (ref 3.5–5.1)
Sodium: 138 mmol/L (ref 135–145)
Total Bilirubin: 0.5 mg/dL (ref 0.3–1.2)
Total Protein: 7.6 g/dL (ref 6.5–8.1)

## 2019-09-12 NOTE — Progress Notes (Signed)
Patient here for follow up. She is having pain to right knee which had been an issue prior to chemo. No new breast problems.

## 2019-09-13 LAB — CANCER ANTIGEN 15-3: CA 15-3: 8.5 U/mL (ref 0.0–25.0)

## 2019-09-13 LAB — CANCER ANTIGEN 27.29: CA 27.29: 9.9 U/mL (ref 0.0–38.6)

## 2019-09-13 NOTE — Progress Notes (Signed)
Hematology/Oncology Follow up note [facility] [phone] [phone]   Patient Care Team: Valerie Roys, DO as PCP - General (Family Medicine) Thornton Park, MD as Referring Physician (Orthopedic Surgery) Rico Junker, RN as Oncology Nurse Navigator Byrnett, Forest Gleason, MD (General Surgery) Earlie Server, MD as Medical Oncologist (Medical Oncology) Noreene Filbert, MD as Referring Physician (Radiation Oncology)  REASON FOR VISIT Follow up for treatment of Breast cancer, assess of chemotherapy toxicity.  HISTORY OF PRESENTING ILLNESS:  Wendy Marsh is a  63 y.o.  female with PMH listed below who was referred to me for evaluation of breast cancer  04/28/2019 diagnostic mammogram Mammogram showed right breast 6 o'clock axis breast mass, 2.9 cm corresponding to area of concern.  cT2N0  Breast mass was biopsied, and pathology showed invasive mammary carcinoma.  Grade 3 , ER/PR- HER2 -.  LVI suspicious, favor present.   #  baseline MUGA testing done and reviewed systolic function depression, LVEF 45%. Given that Adriamycin has cardiac toxicity, potentially can lead to irreversible cardiomyopathy, I discussed with patient about using non-anthracycline-containing regimen. # 05/23/2019- 09/11/2018  S/p 6 cycle of Docetaxel 56m/m2 and Carboplatin every 3 weeks [ data from PShawanoet al 2018 Clin Cancer Res, Pathological Response and Survival in Triple-Negative Breast Cancer Following Neoadjuvant Carboplatin plus Docetaxel.Stage I-III TNBC patients, Neoadjuvant carboplatin (AUC6) plus docetaxel (75 mg/m2) every 21 days  6 cycles. pCR was 55% and Residual cancer burden (RCB) were 13%. Excellent Three-year RFS was 90% in patients with pCR and 66% in those without pCR]    # 10/29/2018   right breast lumpectomy with  excisional biopsy of the right axillary deep sentinel lymph nodes. Pathology showed CR  ypT0, ypN0- complete  pathological remission.   # # Family history of breast cancer plus personal history of triple negative breast cancer. Testing did not reveal a pathogenic mutation in any of the genes analyzed.  BRCA negative.  VUS was detected. Does not change management.   #  INTERVAL HISTORY Wendy CANNADYis a 63y.o. female who has above history reviewed by me today presents for discussion of pathology and further management plan for triple negative breast cancer. Patient reports feeling well at baseline.  No new complaints.  No concerns of her breast.  Chronic arthralgia, no change.  Denies any fever, cough, chills, shortness of breath, chest pain or abdominal pain.   Review of Systems  Constitutional: Negative for chills, fever, malaise/fatigue and weight loss.  HENT: Negative for nosebleeds and sore throat.   Eyes: Negative for double vision, photophobia and redness.  Respiratory: Negative for cough, shortness of breath and wheezing.   Cardiovascular: Negative for chest pain, palpitations, orthopnea and leg swelling.  Gastrointestinal: Negative for abdominal pain, blood in stool, nausea and vomiting.  Genitourinary: Negative for dysuria.  Musculoskeletal: Positive for joint pain. Negative for back pain, myalgias and neck pain.       Chronic right knee pain.  Skin: Negative for itching and rash.  Neurological: Negative for dizziness, tingling and tremors.  Endo/Heme/Allergies: Negative for environmental allergies. Does not bruise/bleed easily.  Psychiatric/Behavioral: Negative for depression and hallucinations.    MEDICAL HISTORY:  Past Medical History:  Diagnosis Date  . Arthritis   . Atrial fibrillation ([place]   . Breast cancer ([place] 04[place]   right breast  . Dysrhythmia    afib  . Hyperlipidemia   . Hypertension   . Hypotension due to drugs 05/30/2018  .  Hypothyroidism   . Lumbago   . Osteoporosis   . Personal history of chemotherapy   . Thyroid disease     SURGICAL  HISTORY: Past Surgical History:  Procedure Laterality Date  . BREAST BIOPSY Right 05/02/2018   Invasive mammary carcinoma, grade 3, neo adj chemo  . BREAST LUMPECTOMY Right 10/29/2018   NO RESIDUAL MALIGNANCY IDENTIFIED.   Marland Kitchen BREAST LUMPECTOMY WITH NEEDLE LOCALIZATION AND AXILLARY SENTINEL LYMPH NODE BX Right 10/29/2018   Procedure: BREAST LUMPECTOMY WITH NEEDLE LOCALIZATION AND SENTINEL LYMPH NODE BX;  Surgeon: Vickie Epley, MD;  Location: [facility] ORS;  Service: General;  Laterality: Right;  . JOINT REPLACEMENT Left    tkr  . PORTACATH PLACEMENT Right 05/11/2018   Procedure: INSERTION  I.J PORT-A-CATH;  Surgeon: Robert Bellow, MD;  Location: [facility] ORS;  Service: General;  Laterality: Right;  . REPLACEMENT TOTAL KNEE      SOCIAL HISTORY: Social History   Socioeconomic History  . Marital status: Married    Spouse name: jerry  . Number of children: 3  . Years of education: Not on file  . Highest education level: Some college, no degree  Occupational History  . Not on file  Tobacco Use  . Smoking status: Never Smoker  . Smokeless tobacco: Never Used  Substance and Sexual Activity  . Alcohol use: No  . Drug use: No  . Sexual activity: Yes    Birth control/protection: Post-menopausal  Other Topics Concern  . Not on file  Social History Narrative  . Not on file   Social Determinants of Health   Financial Resource Strain:   . Difficulty of Paying Living Expenses: Not on file  Food Insecurity:   . Worried About Charity fundraiser in the Last Year: Not on file  . Ran Out of Food in the Last Year: Not on file  Transportation Needs:   . Lack of Transportation (Medical): Not on file  . Lack of Transportation (Non-Medical): Not on file  Physical Activity:   . Days of Exercise per Week: Not on file  . Minutes of Exercise per Session: Not on file  Stress:   . Feeling of Stress : Not on file  Social Connections:   . Frequency of Communication with Friends and Family: Not on  file  . Frequency of Social Gatherings with Friends and Family: Not on file  . Attends Religious Services: Not on file  . Active Member of Clubs or Organizations: Not on file  . Attends Archivist Meetings: Not on file  . Marital Status: Not on file  Intimate Partner Violence:   . Fear of Current or Ex-Partner: Not on file  . Emotionally Abused: Not on file  . Physically Abused: Not on file  . Sexually Abused: Not on file    FAMILY HISTORY: Family History  Problem Relation Age of Onset  . Diabetes Mother   . Congestive Heart Failure Mother   . Breast cancer Maternal Grandmother        dx 45s; deceased 21s  . Cancer Maternal Uncle 41       deceased 36s  . Other Father        No information on father or paternal relatives  . Breast cancer Other 83       paternal half-sister; also esophageal ca at 31; currently 29  . Diabetes Other   . Hypertension Other   . Kidney disease Other   . Thyroid disease Other     ALLERGIES:  is allergic to gabapentin; morphine and related; peanut-containing drug products; and tape.  MEDICATIONS:  Current Outpatient Medications  Medication Sig Dispense Refill  . acetaminophen (TYLENOL) 500 MG tablet Take 500 mg by mouth every 6 (six) hours as needed.    . flecainide (TAMBOCOR) 50 MG tablet Take 50 mg by mouth 2 (two) times daily.     Marland Kitchen levothyroxine (SYNTHROID) 175 MCG tablet Take 1 tablet (175 mcg total) by mouth daily before breakfast. 90 tablet 3  . lidocaine-prilocaine (EMLA) cream APPLY TO AFFECTED AREA ONCE AS DIRECTED    . lisinopril-hydrochlorothiazide (ZESTORETIC) 20-25 MG tablet Take 2 tablets by mouth daily. 180 tablet 1  . metoprolol succinate (TOPROL-XL) 50 MG 24 hr tablet Take 1 tablet (50 mg total) by mouth daily. Take with or immediately following a meal. 90 tablet 1  . naproxen sodium (ALEVE) 220 MG tablet Take 440 mg by mouth daily as needed (for pain or headache).    . Nutritional Supplements (JUICE PLUS FIBRE PO) Take  2-4 each by mouth daily.     Marland Kitchen EPINEPHrine 0.3 mg/0.3 mL IJ SOAJ injection Inject 0.3 mg into the muscle as needed for anaphylaxis.   12  . potassium chloride SA (K-DUR,KLOR-CON) 20 MEQ tablet Take 1 tablet (20 mEq total) by mouth daily. (Patient not taking: Reported on 09/12/2019) 90 tablet 1   No current facility-administered medications for this visit.     PHYSICAL EXAMINATION: ECOG PERFORMANCE STATUS: 1 - Symptomatic but completely ambulatory Vitals:   09/12/19 1105 09/12/19 1122  BP: (!) 172/80 136/70  Pulse: 63 (!) 57  Resp: 18   Temp: (!) 97.5 F (36.4 C)    Filed Weights   09/12/19 1105  Weight: 299 lb (135.6 kg)    Physical Exam Constitutional:      General: She is not in acute distress.    Appearance: She is obese.     Comments: Obese, walks with a walker  HENT:     Head: Normocephalic and atraumatic.  Eyes:     General: No scleral icterus.    Pupils: Pupils are equal, round, and reactive to light.  Cardiovascular:     Rate and Rhythm: Normal rate and regular rhythm.     Heart sounds: Normal heart sounds.  Pulmonary:     Effort: Pulmonary effort is normal. No respiratory distress.     Breath sounds: No wheezing.  Abdominal:     General: Bowel sounds are normal. There is no distension.     Palpations: Abdomen is soft. There is no mass.     Tenderness: There is no abdominal tenderness.  Musculoskeletal:        General: No deformity. Normal range of motion.     Cervical back: Normal range of motion and neck supple.     Comments: Chronic lower extremity edema.  1+  Skin:    General: Skin is warm and dry.     Findings: No erythema or rash.  Neurological:     Mental Status: She is alert and oriented to person, place, and time.     Cranial Nerves: No cranial nerve deficit.     Coordination: Coordination normal.  Psychiatric:        Behavior: Behavior normal.        Thought Content: Thought content normal.       LABORATORY DATA:  I have reviewed the  data as listed Lab Results  Component Value Date   WBC 8.6 09/12/2019   HGB 10.9 (L) 09/12/2019  HCT 35.9 (L) 09/12/2019   MCV 87.8 09/12/2019   PLT 288 09/12/2019   Recent Labs    05/21/19 1009 06/11/19 1315 09/12/19 1019  NA 141 140 138  K 3.7 3.4* 3.3*  CL 99 100 99  CO2 _0 GLUCOSE 77 104* 95  BUN 16 26* 17  CREATININE 0.88 0.90 0.85  CALCIUM 9.1 9.2 9.0  GFRNONAA 70 >60 >60  GFRAA 80 >60 >60  PROT 7.1 7.2 7.6  ALBUMIN 3.7* 3.8 3.6  AST _1 ALT _2 ALKPHOS 72 67 72  BILITOT <0.2 0.6 0.5   RADIOGRAPHIC STUDIES: I have personally reviewed the radiological images as listed and agreed with the findings in the report. NM Cardiac Muga rest: Calculated LEFT ventricular ejection fraction equals 45%   ASSESSMENT & PLAN:  1. 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 Port-A-Cath in place   3. Hypokalemia   4. Normocytic anemia   Cancer Staging Malignant neoplasm of lower-outer quadrant of right female breast [facility]) Staging form: Breast, AJCC 8th Edition - Clinical stage from 05/08/2018: Stage IIB (cT2, cN0, cM0, G3, ER-, PR-, HER2-) - Signed by Earlie Server, MD on 05/08/2018 - Pathologic stage from 11/14/2018: No Stage Recommended (ypT0, pN0, cM0, ER-, PR-, HER2-) - Signed by Earlie Server, MD on 11/14/2018 Labs are reviewed and discussed with patient.  #Triple negative breast cancer,  Status post neoadjuvant chemotherapy with 6 cycles of Doxetaxol and carboplatin. .  Status post right lumpectomy with excisional biopsy of sentinel lymph node. S/p radiation.   Complete pathological remission,  ypT0 ypN0 Patient is clinically doing well.  Tumor marker CA 15-3 and CA 27-29 are stable. Continue history and physical every 3 to 6 months, annual mammogram every 12 months.  Last mammogram was done 04/29/2019.  No mammographic evidence of breast cancer recurrence.  Marland Kitchen #Chronic hypo-kalemia, potassium level is 3.3.  Patient is on lisinopril hydrochlorothiazide.   Encourage patient to take potassium rich food.  Discussed with PCP regarding starting potassium supplementation.  #Port-A-Cath in place, continue port flush.  Every 6 weeks #Normocytic anemia, hemoglobin 10.9.,  Orders Placed This Encounter  Procedures  . CBC with Differential/Platelet    Standing Status:   Future    Standing Expiration Date:   09/11/2020  . Comprehensive metabolic panel    Standing Status:   Future    Standing Expiration Date:   09/11/2020  . Cancer antigen 27.29    Standing Status:   Future    Standing Expiration Date:   09/11/2020  . Cancer Antigen 15-3    Standing Status:   Future    Standing Expiration Date:   09/11/2020    Return of visit: 3 months with repeat CBC and CMP, breast cancer tumor markers.Earlie Server, MD, PhD 09/13/2019

## 2019-10-07 ENCOUNTER — Other Ambulatory Visit: Payer: Self-pay

## 2019-10-07 ENCOUNTER — Inpatient Hospital Stay: Payer: PRIVATE HEALTH INSURANCE | Attending: Oncology

## 2019-10-07 DIAGNOSIS — C50511 Malignant neoplasm of lower-outer quadrant of right female breast: Secondary | ICD-10-CM | POA: Insufficient documentation

## 2019-10-07 DIAGNOSIS — Z171 Estrogen receptor negative status [ER-]: Secondary | ICD-10-CM | POA: Diagnosis not present

## 2019-10-07 DIAGNOSIS — Z95828 Presence of other vascular implants and grafts: Secondary | ICD-10-CM

## 2019-10-07 DIAGNOSIS — Z452 Encounter for adjustment and management of vascular access device: Secondary | ICD-10-CM | POA: Diagnosis not present

## 2019-10-07 MED ORDER — HEPARIN SOD (PORK) LOCK FLUSH 100 UNIT/ML IV SOLN
500.0000 [IU] | Freq: Once | INTRAVENOUS | Status: AC
  Administered 2019-10-07: 11:00:00 500 [IU] via INTRAVENOUS
  Filled 2019-10-07: qty 5

## 2019-10-07 MED ORDER — SODIUM CHLORIDE 0.9% FLUSH
10.0000 mL | Freq: Once | INTRAVENOUS | Status: AC
  Administered 2019-10-07: 10 mL via INTRAVENOUS
  Filled 2019-10-07: qty 10

## 2019-11-18 ENCOUNTER — Other Ambulatory Visit: Payer: Self-pay

## 2019-11-18 ENCOUNTER — Inpatient Hospital Stay: Payer: PRIVATE HEALTH INSURANCE | Attending: Oncology

## 2019-11-18 DIAGNOSIS — Z171 Estrogen receptor negative status [ER-]: Secondary | ICD-10-CM | POA: Insufficient documentation

## 2019-11-18 DIAGNOSIS — C50511 Malignant neoplasm of lower-outer quadrant of right female breast: Secondary | ICD-10-CM | POA: Insufficient documentation

## 2019-11-18 DIAGNOSIS — Z95828 Presence of other vascular implants and grafts: Secondary | ICD-10-CM

## 2019-11-18 DIAGNOSIS — Z452 Encounter for adjustment and management of vascular access device: Secondary | ICD-10-CM | POA: Diagnosis not present

## 2019-11-18 MED ORDER — HEPARIN SOD (PORK) LOCK FLUSH 100 UNIT/ML IV SOLN
500.0000 [IU] | Freq: Once | INTRAVENOUS | Status: AC
  Administered 2019-11-18: 500 [IU] via INTRAVENOUS
  Filled 2019-11-18: qty 5

## 2019-11-18 MED ORDER — SODIUM CHLORIDE 0.9% FLUSH
10.0000 mL | Freq: Once | INTRAVENOUS | Status: AC
  Administered 2019-11-18: 10 mL via INTRAVENOUS
  Filled 2019-11-18: qty 10

## 2019-12-09 ENCOUNTER — Inpatient Hospital Stay: Payer: PRIVATE HEALTH INSURANCE | Attending: Oncology

## 2019-12-09 ENCOUNTER — Other Ambulatory Visit: Payer: Self-pay

## 2019-12-09 DIAGNOSIS — C50919 Malignant neoplasm of unspecified site of unspecified female breast: Secondary | ICD-10-CM

## 2019-12-09 DIAGNOSIS — Z923 Personal history of irradiation: Secondary | ICD-10-CM | POA: Diagnosis not present

## 2019-12-09 DIAGNOSIS — E876 Hypokalemia: Secondary | ICD-10-CM | POA: Diagnosis not present

## 2019-12-09 DIAGNOSIS — Z171 Estrogen receptor negative status [ER-]: Secondary | ICD-10-CM | POA: Diagnosis not present

## 2019-12-09 DIAGNOSIS — C50511 Malignant neoplasm of lower-outer quadrant of right female breast: Secondary | ICD-10-CM | POA: Insufficient documentation

## 2019-12-09 DIAGNOSIS — E039 Hypothyroidism, unspecified: Secondary | ICD-10-CM | POA: Insufficient documentation

## 2019-12-09 DIAGNOSIS — Z452 Encounter for adjustment and management of vascular access device: Secondary | ICD-10-CM | POA: Insufficient documentation

## 2019-12-09 DIAGNOSIS — I4891 Unspecified atrial fibrillation: Secondary | ICD-10-CM | POA: Diagnosis not present

## 2019-12-09 DIAGNOSIS — Z9221 Personal history of antineoplastic chemotherapy: Secondary | ICD-10-CM | POA: Insufficient documentation

## 2019-12-09 DIAGNOSIS — D649 Anemia, unspecified: Secondary | ICD-10-CM | POA: Insufficient documentation

## 2019-12-09 DIAGNOSIS — I10 Essential (primary) hypertension: Secondary | ICD-10-CM | POA: Diagnosis not present

## 2019-12-09 LAB — CBC WITH DIFFERENTIAL/PLATELET
Abs Immature Granulocytes: 0.03 10*3/uL (ref 0.00–0.07)
Basophils Absolute: 0 10*3/uL (ref 0.0–0.1)
Basophils Relative: 0 %
Eosinophils Absolute: 0.4 10*3/uL (ref 0.0–0.5)
Eosinophils Relative: 5 %
HCT: 36.1 % (ref 36.0–46.0)
Hemoglobin: 10.9 g/dL — ABNORMAL LOW (ref 12.0–15.0)
Immature Granulocytes: 0 %
Lymphocytes Relative: 17 %
Lymphs Abs: 1.5 10*3/uL (ref 0.7–4.0)
MCH: 26.8 pg (ref 26.0–34.0)
MCHC: 30.2 g/dL (ref 30.0–36.0)
MCV: 88.9 fL (ref 80.0–100.0)
Monocytes Absolute: 0.5 10*3/uL (ref 0.1–1.0)
Monocytes Relative: 5 %
Neutro Abs: 6.3 10*3/uL (ref 1.7–7.7)
Neutrophils Relative %: 73 %
Platelets: 273 10*3/uL (ref 150–400)
RBC: 4.06 MIL/uL (ref 3.87–5.11)
RDW: 15.1 % (ref 11.5–15.5)
WBC: 8.8 10*3/uL (ref 4.0–10.5)
nRBC: 0 % (ref 0.0–0.2)

## 2019-12-09 LAB — COMPREHENSIVE METABOLIC PANEL
ALT: 16 U/L (ref 0–44)
AST: 21 U/L (ref 15–41)
Albumin: 3.7 g/dL (ref 3.5–5.0)
Alkaline Phosphatase: 72 U/L (ref 38–126)
Anion gap: 12 (ref 5–15)
BUN: 16 mg/dL (ref 8–23)
CO2: 28 mmol/L (ref 22–32)
Calcium: 8.8 mg/dL — ABNORMAL LOW (ref 8.9–10.3)
Chloride: 97 mmol/L — ABNORMAL LOW (ref 98–111)
Creatinine, Ser: 0.88 mg/dL (ref 0.44–1.00)
GFR calc Af Amer: >60 mL/min (ref >60)
GFR calc non Af Amer: >60 mL/min (ref >60)
Glucose, Bld: 108 mg/dL — ABNORMAL HIGH (ref 70–99)
Potassium: 3.2 mmol/L — ABNORMAL LOW (ref 3.5–5.1)
Sodium: 137 mmol/L (ref 135–145)
Total Bilirubin: 0.4 mg/dL (ref 0.3–1.2)
Total Protein: 7.8 g/dL (ref 6.5–8.1)

## 2019-12-10 ENCOUNTER — Telehealth: Payer: PRIVATE HEALTH INSURANCE | Admitting: Oncology

## 2019-12-10 LAB — CANCER ANTIGEN 15-3: CA 15-3: 9.4 U/mL (ref 0.0–25.0)

## 2019-12-10 LAB — CANCER ANTIGEN 27.29: CA 27.29: 14.5 U/mL (ref 0.0–38.6)

## 2019-12-11 ENCOUNTER — Encounter: Payer: Self-pay | Admitting: Oncology

## 2019-12-11 ENCOUNTER — Inpatient Hospital Stay (HOSPITAL_BASED_OUTPATIENT_CLINIC_OR_DEPARTMENT_OTHER): Payer: PRIVATE HEALTH INSURANCE | Admitting: Oncology

## 2019-12-11 DIAGNOSIS — Z95828 Presence of other vascular implants and grafts: Secondary | ICD-10-CM

## 2019-12-11 DIAGNOSIS — C50919 Malignant neoplasm of unspecified site of unspecified female breast: Secondary | ICD-10-CM | POA: Diagnosis not present

## 2019-12-11 DIAGNOSIS — E876 Hypokalemia: Secondary | ICD-10-CM | POA: Diagnosis not present

## 2019-12-11 DIAGNOSIS — D649 Anemia, unspecified: Secondary | ICD-10-CM | POA: Diagnosis not present

## 2019-12-11 NOTE — Progress Notes (Signed)
HEMATOLOGY-ONCOLOGY TeleHEALTH VISIT PROGRESS NOTE  I connected with Wendy Marsh on 12/11/19 at  2:15 PM EST by video enabled telemedicine visit and verified that I am speaking with the correct person using two identifiers. I discussed the limitations, risks, security and privacy concerns of performing an evaluation and management service by telemedicine and the availability of in-person appointments. I also discussed with the patient that there may be a patient responsible charge related to this service. The patient expressed understanding and agreed to proceed.   Other persons participating in the visit and their role in the encounter:  None  Patient's location: Home  Provider's location: office Chief Complaint: Follow-up for breast cancer   INTERVAL HISTORY Wendy Marsh is a 63 y.o. female who has above history reviewed by me today presents for follow up visit for  breast cancer Problems and complaints are listed below:  Patient reports doing well.  She denies any new complaints.  No concerns of breast.  Review of Systems  Constitutional: Negative for appetite change, chills, fatigue and fever.  HENT:   Negative for hearing loss and voice change.   Eyes: Negative for eye problems.  Respiratory: Negative for chest tightness and cough.   Cardiovascular: Negative for chest pain.  Gastrointestinal: Negative for abdominal distention, abdominal pain and blood in stool.  Endocrine: Negative for hot flashes.  Genitourinary: Negative for difficulty urinating and frequency.   Musculoskeletal: Negative for arthralgias.  Skin: Negative for itching and rash.  Neurological: Negative for extremity weakness.  Hematological: Negative for adenopathy.  Psychiatric/Behavioral: Negative for confusion.    Past Medical History:  Diagnosis Date  . Arthritis   . Atrial fibrillation ([place])   . Breast cancer ([place]) 04/2018   right breast  . Dysrhythmia    afib  . Hyperlipidemia   .  Hypertension   . Hypotension due to drugs 05/30/2018  . Hypothyroidism   . Lumbago   . Osteoporosis   . Personal history of chemotherapy   . Thyroid disease    Past Surgical History:  Procedure Laterality Date  . BREAST BIOPSY Right 05/02/2018   Invasive mammary carcinoma, grade 3, neo adj chemo  . BREAST LUMPECTOMY Right 10/29/2018   NO RESIDUAL MALIGNANCY IDENTIFIED.   Marland Kitchen BREAST LUMPECTOMY WITH NEEDLE LOCALIZATION AND AXILLARY SENTINEL LYMPH NODE BX Right 10/29/2018   Procedure: BREAST LUMPECTOMY WITH NEEDLE LOCALIZATION AND SENTINEL LYMPH NODE BX;  Surgeon: Wendy Epley, MD;  Location: [facility] ORS;  Service: General;  Laterality: Right;  . JOINT REPLACEMENT Left    tkr  . PORTACATH PLACEMENT Right 05/11/2018   Procedure: INSERTION  I.J PORT-A-CATH;  Surgeon: Wendy Bellow, MD;  Location: [facility] ORS;  Service: General;  Laterality: Right;  . REPLACEMENT TOTAL KNEE      Family History  Problem Relation Age of Onset  . Diabetes Mother   . Congestive Heart Failure Mother   . Breast cancer Maternal Grandmother        dx 64s; deceased 14s  . Cancer Maternal Uncle 72       deceased 28s  . Other Father        No information on father or paternal relatives  . Breast cancer Other 75       paternal half-sister; also esophageal ca at 83; currently 24  . Diabetes Other   . Hypertension Other   . Kidney disease Other   . Thyroid disease Other     Social History   Socioeconomic History  . Marital  status: Married    Spouse name: jerry  . Number of children: 3  . Years of education: Not on file  . Highest education level: Some college, no degree  Occupational History  . Not on file  Tobacco Use  . Smoking status: Never Smoker  . Smokeless tobacco: Never Used  Substance and Sexual Activity  . Alcohol use: No  . Drug use: No  . Sexual activity: Yes    Birth control/protection: Post-menopausal  Other Topics Concern  . Not on file  Social History Narrative  . Not on file    Social Determinants of Health   Financial Resource Strain:   . Difficulty of Paying Living Expenses: Not on file  Food Insecurity:   . Worried About Charity fundraiser in the Last Year: Not on file  . Ran Out of Food in the Last Year: Not on file  Transportation Needs:   . Lack of Transportation (Medical): Not on file  . Lack of Transportation (Non-Medical): Not on file  Physical Activity:   . Days of Exercise per Week: Not on file  . Minutes of Exercise per Session: Not on file  Stress:   . Feeling of Stress : Not on file  Social Connections:   . Frequency of Communication with Friends and Family: Not on file  . Frequency of Social Gatherings with Friends and Family: Not on file  . Attends Religious Services: Not on file  . Active Member of Clubs or Organizations: Not on file  . Attends Archivist Meetings: Not on file  . Marital Status: Not on file  Intimate Partner Violence:   . Fear of Current or Ex-Partner: Not on file  . Emotionally Abused: Not on file  . Physically Abused: Not on file  . Sexually Abused: Not on file    Current Outpatient Medications on File Prior to Visit  Medication Sig Dispense Refill  . acetaminophen (TYLENOL) 500 MG tablet Take 500 mg by mouth every 6 (six) hours as needed.    Marland Kitchen EPINEPHrine 0.3 mg/0.3 mL IJ SOAJ injection Inject 0.3 mg into the muscle as needed for anaphylaxis.   12  . flecainide (TAMBOCOR) 50 MG tablet Take 50 mg by mouth 2 (two) times daily.     Marland Kitchen levothyroxine (SYNTHROID) 175 MCG tablet Take 1 tablet (175 mcg total) by mouth daily before breakfast. 90 tablet 3  . lidocaine-prilocaine (EMLA) cream APPLY TO AFFECTED AREA ONCE AS DIRECTED    . lisinopril-hydrochlorothiazide (ZESTORETIC) 20-25 MG tablet Take 2 tablets by mouth daily. 180 tablet 1  . metoprolol succinate (TOPROL-XL) 50 MG 24 hr tablet Take 1 tablet (50 mg total) by mouth daily. Take with or immediately following a meal. 90 tablet 1  . naproxen sodium  (ALEVE) 220 MG tablet Take 440 mg by mouth daily as needed (for pain or headache).    . Nutritional Supplements (JUICE PLUS FIBRE PO) Take 2-4 each by mouth daily.     . potassium chloride SA (K-DUR,KLOR-CON) 20 MEQ tablet Take 1 tablet (20 mEq total) by mouth daily. (Patient not taking: Reported on 09/12/2019) 90 tablet 1   No current facility-administered medications on file prior to visit.    Allergies  Allergen Reactions  . Gabapentin     "makes me feel kinda out of it"  . Morphine And Related Nausea And Vomiting  . Peanut-Containing Drug Products Swelling  . Tape Rash       Observations/Objective: Today's Vitals   12/11/19 1430  PainSc: 0-No pain   There is no height or weight on file to calculate BMI.  Physical Exam  Constitutional: No distress.  Neurological: She is alert.    CBC    Component Value Date/Time   WBC 8.8 12/09/2019 1117   RBC 4.06 12/09/2019 1117   HGB 10.9 (L) 12/09/2019 1117   HGB 10.9 (L) 05/21/2019 1009   HCT 36.1 12/09/2019 1117   HCT 34.9 05/21/2019 1009   PLT 273 12/09/2019 1117   PLT 294 05/21/2019 1009   MCV 88.9 12/09/2019 1117   MCV 90 05/21/2019 1009   MCH 26.8 12/09/2019 1117   MCHC 30.2 12/09/2019 1117   RDW 15.1 12/09/2019 1117   RDW 13.9 05/21/2019 1009   LYMPHSABS 1.5 12/09/2019 1117   LYMPHSABS 2.2 05/21/2019 1009   MONOABS 0.5 12/09/2019 1117   EOSABS 0.4 12/09/2019 1117   EOSABS 0.1 05/21/2019 1009   BASOSABS 0.0 12/09/2019 1117   BASOSABS 0.0 05/21/2019 1009    CMP     Component Value Date/Time   NA 137 12/09/2019 1117   NA 141 05/21/2019 1009   K 3.2 (L) 12/09/2019 1117   CL 97 (L) 12/09/2019 1117   CO2 28 12/09/2019 1117   GLUCOSE 108 (H) 12/09/2019 1117   BUN 16 12/09/2019 1117   BUN 16 05/21/2019 1009   CREATININE 0.88 12/09/2019 1117   CALCIUM 8.8 (L) 12/09/2019 1117   PROT 7.8 12/09/2019 1117   PROT 7.1 05/21/2019 1009   ALBUMIN 3.7 12/09/2019 1117   ALBUMIN 3.7 (L) 05/21/2019 1009   AST 21 12/09/2019  1117   ALT 16 12/09/2019 1117   ALKPHOS 72 12/09/2019 1117   BILITOT 0.4 12/09/2019 1117   BILITOT <0.2 05/21/2019 1009   GFRNONAA >60 12/09/2019 1117   GFRAA >60 12/09/2019 1117     Assessment and Plan: 1. 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 Hypokalemia   3. Port-A-Cath in place   4. Normocytic anemia     #History of triple negative breast cancer, status post neoadjuvant chemotherapy, surgery radiation. Labs reviewed and discussed with patient.  Tumor marker has been stable. She is clinically doing well. Recommend bilateral diagnostic mammogram in July 2021. Calcium level is low. Commend patient to take oral calcium 1200 mg and vitamin D 800 to 1000 units daily.  #Chronic hypokalemia, probably due to use of HCTZ Potassium level is 3.2. Encourage patient to increase intake of food enriched with potassium. Recommend patient to discuss with primary care provider Dr. Wynetta Emery for the need of chronic supplementation for potassium.  #Port-A-Cath in place, continue port flush every 6 to 8 weeks. #Normocytic anemia, chronic, hemoglobin has been stable.  Follow Up Instructions: 4 months.   I discussed the assessment and treatment plan with the patient. The patient was provided an opportunity to ask questions and all were answered. The patient agreed with the plan and demonstrated an understanding of the instructions.  The patient was advised to call back or seek an in-person evaluation if the symptoms worsen or if the condition fails to improve as anticipated.   Earlie Server, MD 12/11/2019 9:45 PM

## 2019-12-11 NOTE — Progress Notes (Signed)
Patient verified using two identifiers for virtual visit via telephone today.  Patient does not offer any problems today.  

## 2019-12-16 ENCOUNTER — Other Ambulatory Visit: Payer: Self-pay | Admitting: Family Medicine

## 2019-12-16 DIAGNOSIS — I10 Essential (primary) hypertension: Secondary | ICD-10-CM

## 2019-12-16 NOTE — Telephone Encounter (Signed)
Patient has enough until appointment.

## 2019-12-16 NOTE — Telephone Encounter (Signed)
Needs appointment

## 2019-12-16 NOTE — Telephone Encounter (Signed)
Routing to provider  

## 2019-12-16 NOTE — Telephone Encounter (Signed)
Scheduled pt for 12/18/2019 as a virtual for med refills , Pt understood and confirmed understanding.

## 2019-12-16 NOTE — Telephone Encounter (Signed)
Does she have enough to get to that appointment

## 2019-12-16 NOTE — Telephone Encounter (Signed)
Requested medication (s) are due for refill today: yes  Requested medication (s) are on the active medication list: yes  Last refill:  05/21/19  Future visit scheduled: no  Notes to clinic:  Pt 6 months overdue for visit. Called pt and LM on VM to call office to make an appointment.   Requested Prescriptions  Pending Prescriptions Disp Refills   lisinopril-hydrochlorothiazide (ZESTORETIC) 20-25 MG tablet [Pharmacy Med Name: Lisinopril-hydroCHLOROthiazide 20-25 MG Oral Tablet] 180 tablet 0    Sig: Take 2 tablets by mouth once daily      Cardiovascular:  ACEI + Diuretic Combos Failed - 12/16/2019  1:52 PM      Failed - K in normal range and within 180 days    Potassium  Date Value Ref Range Status  12/09/2019 3.2 (L) 3.5 - 5.1 mmol/L Final          Failed - Ca in normal range and within 180 days    Calcium  Date Value Ref Range Status  12/09/2019 8.8 (L) 8.9 - 10.3 mg/dL Final          Failed - Valid encounter within last 6 months    Recent Outpatient Visits           6 months ago Routine general medical examination at a health care facility   [facility], [place] P, DO   1 year ago Benign essential hypertension   [facility], [place], DO   1 year ago Breast lump   [place], [place], DO   1 year ago Routine general medical examination at a health care facility   [facility], [place], DO   1 year ago Allergy to peanuts   [facility], Megan P, DO              Passed - Na in normal range and within 180 days    Sodium  Date Value Ref Range Status  12/09/2019 137 135 - 145 mmol/L Final  05/21/2019 141 134 - 144 mmol/L Final          Passed - Cr in normal range and within 180 days    Creatinine, Ser  Date Value Ref Range Status  12/09/2019 0.88 0.44 - 1.00 mg/dL Final          Passed - Patient is not pregnant      Passed - Last BP in normal range    BP  Readings from Last 1 Encounters:  09/12/19 136/70

## 2019-12-18 ENCOUNTER — Encounter: Payer: Self-pay | Admitting: Family Medicine

## 2019-12-18 ENCOUNTER — Telehealth (INDEPENDENT_AMBULATORY_CARE_PROVIDER_SITE_OTHER): Payer: PRIVATE HEALTH INSURANCE | Admitting: Family Medicine

## 2019-12-18 DIAGNOSIS — I10 Essential (primary) hypertension: Secondary | ICD-10-CM

## 2019-12-18 MED ORDER — METOPROLOL SUCCINATE ER 50 MG PO TB24: 50.0000 mg | ORAL_TABLET | Freq: Every day | ORAL | 1 refills | Status: DC

## 2019-12-18 MED ORDER — LISINOPRIL-HYDROCHLOROTHIAZIDE 20-25 MG PO TABS: 2.0000 | ORAL_TABLET | Freq: Every day | ORAL | 1 refills | Status: DC

## 2019-12-18 NOTE — Progress Notes (Signed)
BP (!) 108/56   LMP 03/17/2009 (Approximate)    Subjective:    Patient ID: Wendy Marsh, female    DOB: [birth year], 64 y.o.   MRN: [id number]  HPI: Wendy Marsh is a 64 y.o. female  Chief Complaint  Patient presents with  . Hypertension   HYPERTENSION Hypertension status: controlled  Satisfied with current treatment? yes Duration of hypertension: chronic BP monitoring frequency:  a few times a week BP range:  BP medication side effects:  no Medication compliance: excellent compliance Previous BP meds: lisinopril- HCTZ, metoprolol Aspirin: no Recurrent headaches: no Visual changes: no Palpitations: no Dyspnea: no Chest pain: no Lower extremity edema: no Dizzy/lightheaded: no  Relevant past medical, surgical, family and social history reviewed and updated as indicated. Interim medical history since our last visit reviewed. Allergies and medications reviewed and updated.  Review of Systems  Constitutional: Negative.   Respiratory: Negative.   Cardiovascular: Negative.   Gastrointestinal: Negative.   Neurological: Negative.   Psychiatric/Behavioral: Negative.     Per HPI unless specifically indicated above     Objective:    BP (!) 108/56   LMP 03/17/2009 (Approximate)   Wt Readings from Last 3 Encounters:  09/12/19 299 lb (135.6 kg)  08/19/19 299 lb 12.8 oz (136 kg)  06/11/19 (!) 302 lb 11.2 oz (137.3 kg)    Physical Exam Vitals and nursing note reviewed.  Constitutional:      General: She is not in acute distress.    Appearance: Normal appearance. She is not ill-appearing, toxic-appearing or diaphoretic.  HENT:     Head: Normocephalic and atraumatic.     Right Ear: External ear normal.     Left Ear: External ear normal.     Nose: Nose normal.     Mouth/Throat:     Mouth: Mucous membranes are moist.     Pharynx: Oropharynx is clear.  Eyes:     General: No scleral icterus.       Right eye: No discharge.        Left eye: No discharge.     Conjunctiva/sclera: Conjunctivae normal.     Pupils: Pupils are equal, round, and reactive to light.  Pulmonary:     Effort: Pulmonary effort is normal. No respiratory distress.     Comments: Speaking in full sentences Musculoskeletal:        General: Normal range of motion.     Cervical back: Normal range of motion.  Skin:    Coloration: Skin is not jaundiced or pale.     Findings: No bruising, erythema, lesion or rash.  Neurological:     Mental Status: She is alert and oriented to person, place, and time. Mental status is at baseline.  Psychiatric:        Mood and Affect: Mood normal.        Behavior: Behavior normal.        Thought Content: Thought content normal.        Judgment: Judgment normal.     Results for orders placed or performed in visit on 12/09/19  Cancer Antigen 15-3  Result Value Ref Range   CA 15-3 9.4 0.0 - 25.0 U/mL  Cancer antigen 27.29  Result Value Ref Range   CA 27.29 14.5 0.0 - 38.6 U/mL  Comprehensive metabolic panel  Result Value Ref Range   Sodium 137 135 - 145 mmol/L   Potassium 3.2 (L) 3.5 - 5.1 mmol/L   Chloride 97 (L) 98 - 111 mmol/L  CO2 28 22 - 32 mmol/L   Glucose, Bld 108 (H) 70 - 99 mg/dL   BUN 16 8 - 23 mg/dL   Creatinine, Ser 0.88 0.44 - 1.00 mg/dL   Calcium 8.8 (L) 8.9 - 10.3 mg/dL   Total Protein 7.8 6.5 - 8.1 g/dL   Albumin 3.7 3.5 - 5.0 g/dL   AST 21 15 - 41 U/L   ALT 16 0 - 44 U/L   Alkaline Phosphatase 72 38 - 126 U/L   Total Bilirubin 0.4 0.3 - 1.2 mg/dL   GFR calc non Af Amer >60 >60 mL/min   GFR calc Af Amer >60 >60 mL/min   Anion gap 12 5 - 15  CBC with Differential/Platelet  Result Value Ref Range   WBC 8.8 4.0 - 10.5 K/uL   RBC 4.06 3.87 - 5.11 MIL/uL   Hemoglobin 10.9 (L) 12.0 - 15.0 g/dL   HCT 36.1 36.0 - 46.0 %   MCV 88.9 80.0 - 100.0 fL   MCH 26.8 26.0 - 34.0 pg   MCHC 30.2 30.0 - 36.0 g/dL   RDW 15.1 11.5 - 15.5 %   Platelets 273 150 - 400 K/uL   nRBC 0.0 0.0 - 0.2 %   Neutrophils Relative % 73 %    Neutro Abs 6.3 1.7 - 7.7 K/uL   Lymphocytes Relative 17 %   Lymphs Abs 1.5 0.7 - 4.0 K/uL   Monocytes Relative 5 %   Monocytes Absolute 0.5 0.1 - 1.0 K/uL   Eosinophils Relative 5 %   Eosinophils Absolute 0.4 0.0 - 0.5 K/uL   Basophils Relative 0 %   Basophils Absolute 0.0 0.0 - 0.1 K/uL   Immature Granulocytes 0 %   Abs Immature Granulocytes 0.03 0.00 - 0.07 K/uL      Assessment & Plan:   Problem List Items Addressed This Visit      Cardiovascular and Mediastinum   Benign essential hypertension    Under good control on current regimen. Continue current regimen. Continue to monitor. Call with any concerns. Refills given. Labs checked last week at oncology and were good.        Relevant Medications   lisinopril-hydrochlorothiazide (ZESTORETIC) 20-25 MG tablet   metoprolol succinate (TOPROL-XL) 50 MG 24 hr tablet    Other Visit Diagnoses    Essential hypertension       Relevant Medications   lisinopril-hydrochlorothiazide (ZESTORETIC) 20-25 MG tablet   metoprolol succinate (TOPROL-XL) 50 MG 24 hr tablet       Follow up plan: Return in about 6 months (around 06/19/2020) for physical.    . This visit was completed via MyChart due to the restrictions of the COVID-19 pandemic. All issues as above were discussed and addressed. Physical exam was done as above through visual confirmation on MyChart. If it was felt that the patient should be evaluated in the office, they were directed there. The patient verbally consented to this visit. . Location of the patient: home . Location of the provider: home . Those involved with this call:  . Provider: Park Liter, DO . CMA: Tiffany Reel, CMA . Front Desk/Registration: Don Perking  . Time spent on call: 15 minutes with patient face to face via video conference. More than 50% of this time was spent in counseling and coordination of care. 23 minutes total spent in review of patient's record and preparation of their  chart.

## 2019-12-18 NOTE — Assessment & Plan Note (Signed)
Under good control on current regimen. Continue current regimen. Continue to monitor. Call with any concerns. Refills given. Labs checked last week at oncology and were good.

## 2019-12-30 ENCOUNTER — Inpatient Hospital Stay: Payer: PRIVATE HEALTH INSURANCE

## 2019-12-30 DIAGNOSIS — C50511 Malignant neoplasm of lower-outer quadrant of right female breast: Secondary | ICD-10-CM | POA: Diagnosis not present

## 2019-12-30 DIAGNOSIS — Z95828 Presence of other vascular implants and grafts: Secondary | ICD-10-CM

## 2019-12-30 MED ORDER — SODIUM CHLORIDE 0.9% FLUSH
10.0000 mL | Freq: Once | INTRAVENOUS | Status: AC
  Administered 2019-12-30: 10 mL via INTRAVENOUS
  Filled 2019-12-30: qty 10

## 2019-12-30 MED ORDER — HEPARIN SOD (PORK) LOCK FLUSH 100 UNIT/ML IV SOLN
500.0000 [IU] | Freq: Once | INTRAVENOUS | Status: AC
  Administered 2019-12-30: 500 [IU] via INTRAVENOUS
  Filled 2019-12-30: qty 5

## 2019-12-30 MED ORDER — HEPARIN SOD (PORK) LOCK FLUSH 100 UNIT/ML IV SOLN
INTRAVENOUS | Status: AC
  Filled 2019-12-30: qty 5

## 2020-02-10 ENCOUNTER — Other Ambulatory Visit: Payer: Self-pay

## 2020-02-10 ENCOUNTER — Inpatient Hospital Stay: Payer: PRIVATE HEALTH INSURANCE | Attending: Oncology

## 2020-02-10 DIAGNOSIS — Z171 Estrogen receptor negative status [ER-]: Secondary | ICD-10-CM | POA: Diagnosis not present

## 2020-02-10 DIAGNOSIS — Z452 Encounter for adjustment and management of vascular access device: Secondary | ICD-10-CM | POA: Diagnosis not present

## 2020-02-10 DIAGNOSIS — C50511 Malignant neoplasm of lower-outer quadrant of right female breast: Secondary | ICD-10-CM | POA: Insufficient documentation

## 2020-02-10 DIAGNOSIS — Z95828 Presence of other vascular implants and grafts: Secondary | ICD-10-CM

## 2020-02-10 MED ORDER — HEPARIN SOD (PORK) LOCK FLUSH 100 UNIT/ML IV SOLN
INTRAVENOUS | Status: AC
  Filled 2020-02-10: qty 5

## 2020-02-10 MED ORDER — HEPARIN SOD (PORK) LOCK FLUSH 100 UNIT/ML IV SOLN
500.0000 [IU] | Freq: Once | INTRAVENOUS | Status: AC
  Administered 2020-02-10: 500 [IU] via INTRAVENOUS
  Filled 2020-02-10: qty 5

## 2020-02-10 MED ORDER — SODIUM CHLORIDE 0.9% FLUSH
10.0000 mL | Freq: Once | INTRAVENOUS | Status: AC
  Administered 2020-02-10: 10 mL via INTRAVENOUS
  Filled 2020-02-10: qty 10

## 2020-03-23 ENCOUNTER — Inpatient Hospital Stay: Payer: Medicare Other | Attending: Oncology

## 2020-03-23 ENCOUNTER — Other Ambulatory Visit: Payer: Self-pay

## 2020-03-23 DIAGNOSIS — C50511 Malignant neoplasm of lower-outer quadrant of right female breast: Secondary | ICD-10-CM | POA: Insufficient documentation

## 2020-03-23 DIAGNOSIS — Z452 Encounter for adjustment and management of vascular access device: Secondary | ICD-10-CM | POA: Diagnosis not present

## 2020-03-23 DIAGNOSIS — Z95828 Presence of other vascular implants and grafts: Secondary | ICD-10-CM

## 2020-03-23 DIAGNOSIS — Z171 Estrogen receptor negative status [ER-]: Secondary | ICD-10-CM | POA: Diagnosis not present

## 2020-03-23 MED ORDER — SODIUM CHLORIDE 0.9% FLUSH
10.0000 mL | Freq: Once | INTRAVENOUS | Status: AC
  Administered 2020-03-23: 10 mL via INTRAVENOUS
  Filled 2020-03-23: qty 10

## 2020-03-23 MED ORDER — HEPARIN SOD (PORK) LOCK FLUSH 100 UNIT/ML IV SOLN
500.0000 [IU] | Freq: Once | INTRAVENOUS | Status: AC
  Administered 2020-03-23: 500 [IU] via INTRAVENOUS
  Filled 2020-03-23: qty 5

## 2020-04-13 ENCOUNTER — Other Ambulatory Visit: Payer: Self-pay | Admitting: Family Medicine

## 2020-04-13 DIAGNOSIS — I10 Essential (primary) hypertension: Secondary | ICD-10-CM

## 2020-04-13 NOTE — Telephone Encounter (Signed)
Approved per protocol. Requested Prescriptions  Pending Prescriptions Disp Refills  . metoprolol succinate (TOPROL-XL) 50 MG 24 hr tablet [Pharmacy Med Name: Metoprolol Succinate ER 50 MG Oral Tablet Extended Release 24 Hour] 90 tablet 0    Sig: TAKE 1 TABLET BY MOUTH ONCE DAILY WITH  OR  IMMEDIATELY  FOLLOWING  A  MEAL     Cardiovascular:  Beta Blockers Passed - 04/13/2020 11:43 AM      Passed - Last BP in normal range    BP Readings from Last 1 Encounters:  12/18/19 (!) 108/56         Passed - Last Heart Rate in normal range    Pulse Readings from Last 1 Encounters:  09/12/19 (!) 85         Passed - Valid encounter within last 6 months    Recent Outpatient Visits          3 months ago Essential hypertension   [place], Megan P, DO   10 months ago Routine general medical examination at a health care facility   [place], [place], DO   1 year ago Benign essential hypertension   [place], [place], DO   1 year ago Breast lump   [place], Megan P, DO   2 years ago Routine general medical examination at a health care facility   [place], Barb Merino, DO      Future Appointments            In 2 months Wynetta Emery, Barb Merino, DO [facility], [place]

## 2020-04-29 ENCOUNTER — Ambulatory Visit
Admission: RE | Admit: 2020-04-29 | Discharge: 2020-04-29 | Disposition: A | Discharge status: Home / Self Care | Payer: Medicare Other | Source: Ambulatory Visit | Attending: Oncology | Admitting: Oncology

## 2020-04-29 DIAGNOSIS — C50919 Malignant neoplasm of unspecified site of unspecified female breast: Secondary | ICD-10-CM

## 2020-04-29 DIAGNOSIS — Z08 Encounter for follow-up examination after completed treatment for malignant neoplasm: Secondary | ICD-10-CM | POA: Diagnosis not present

## 2020-04-29 DIAGNOSIS — Z853 Personal history of malignant neoplasm of breast: Secondary | ICD-10-CM | POA: Insufficient documentation

## 2020-05-01 ENCOUNTER — Other Ambulatory Visit: Payer: PRIVATE HEALTH INSURANCE

## 2020-05-01 ENCOUNTER — Ambulatory Visit: Payer: PRIVATE HEALTH INSURANCE | Admitting: Oncology

## 2020-05-08 ENCOUNTER — Encounter: Payer: Self-pay | Admitting: Oncology

## 2020-05-08 ENCOUNTER — Inpatient Hospital Stay: Payer: Medicare Other | Attending: Oncology

## 2020-05-08 ENCOUNTER — Inpatient Hospital Stay (HOSPITAL_BASED_OUTPATIENT_CLINIC_OR_DEPARTMENT_OTHER): Payer: Medicare Other | Admitting: Oncology

## 2020-05-08 ENCOUNTER — Other Ambulatory Visit: Payer: Self-pay

## 2020-05-08 VITALS — BP 159/79 | HR 63 | Temp 97.3°F | Wt 313.2 lb

## 2020-05-08 DIAGNOSIS — D649 Anemia, unspecified: Secondary | ICD-10-CM | POA: Diagnosis not present

## 2020-05-08 DIAGNOSIS — E039 Hypothyroidism, unspecified: Secondary | ICD-10-CM | POA: Insufficient documentation

## 2020-05-08 DIAGNOSIS — I4891 Unspecified atrial fibrillation: Secondary | ICD-10-CM | POA: Diagnosis not present

## 2020-05-08 DIAGNOSIS — Z9221 Personal history of antineoplastic chemotherapy: Secondary | ICD-10-CM | POA: Insufficient documentation

## 2020-05-08 DIAGNOSIS — C50511 Malignant neoplasm of lower-outer quadrant of right female breast: Secondary | ICD-10-CM

## 2020-05-08 DIAGNOSIS — E876 Hypokalemia: Secondary | ICD-10-CM | POA: Insufficient documentation

## 2020-05-08 DIAGNOSIS — Z853 Personal history of malignant neoplasm of breast: Secondary | ICD-10-CM | POA: Diagnosis not present

## 2020-05-08 DIAGNOSIS — Z923 Personal history of irradiation: Secondary | ICD-10-CM | POA: Diagnosis not present

## 2020-05-08 DIAGNOSIS — C50919 Malignant neoplasm of unspecified site of unspecified female breast: Secondary | ICD-10-CM

## 2020-05-08 LAB — CBC WITH DIFFERENTIAL/PLATELET
Abs Immature Granulocytes: 0.04 10*3/uL (ref 0.00–0.07)
Basophils Absolute: 0 10*3/uL (ref 0.0–0.1)
Basophils Relative: 0 %
Eosinophils Absolute: 0.3 10*3/uL (ref 0.0–0.5)
Eosinophils Relative: 3 %
HCT: 36.8 % (ref 36.0–46.0)
Hemoglobin: 11.9 g/dL — ABNORMAL LOW (ref 12.0–15.0)
Immature Granulocytes: 0 %
Lymphocytes Relative: 20 %
Lymphs Abs: 2 10*3/uL (ref 0.7–4.0)
MCH: 27.7 pg (ref 26.0–34.0)
MCHC: 32.3 g/dL (ref 30.0–36.0)
MCV: 85.6 fL (ref 80.0–100.0)
Monocytes Absolute: 0.7 10*3/uL (ref 0.1–1.0)
Monocytes Relative: 7 %
Neutro Abs: 7 10*3/uL (ref 1.7–7.7)
Neutrophils Relative %: 70 %
Platelets: 286 10*3/uL (ref 150–400)
RBC: 4.3 MIL/uL (ref 3.87–5.11)
RDW: 15.3 % (ref 11.5–15.5)
WBC: 10 10*3/uL (ref 4.0–10.5)
nRBC: 0 % (ref 0.0–0.2)

## 2020-05-08 LAB — COMPREHENSIVE METABOLIC PANEL
ALT: 16 U/L (ref 0–44)
AST: 18 U/L (ref 15–41)
Albumin: 3.7 g/dL (ref 3.5–5.0)
Alkaline Phosphatase: 68 U/L (ref 38–126)
Anion gap: 9 (ref 5–15)
BUN: 20 mg/dL (ref 8–23)
CO2: 31 mmol/L (ref 22–32)
Calcium: 8.9 mg/dL (ref 8.9–10.3)
Chloride: 98 mmol/L (ref 98–111)
Creatinine, Ser: 1 mg/dL (ref 0.44–1.00)
GFR calc Af Amer: >60 mL/min (ref >60)
GFR calc non Af Amer: 59 mL/min — ABNORMAL LOW (ref >60)
Glucose, Bld: 103 mg/dL — ABNORMAL HIGH (ref 70–99)
Potassium: 3.8 mmol/L (ref 3.5–5.1)
Sodium: 138 mmol/L (ref 135–145)
Total Bilirubin: 0.6 mg/dL (ref 0.3–1.2)
Total Protein: 7.5 g/dL (ref 6.5–8.1)

## 2020-05-08 MED ORDER — HEPARIN SOD (PORK) LOCK FLUSH 100 UNIT/ML IV SOLN
500.0000 [IU] | Freq: Once | INTRAVENOUS | Status: AC
  Administered 2020-05-08: 500 [IU] via INTRAVENOUS
  Filled 2020-05-08: qty 5

## 2020-05-08 MED ORDER — HEPARIN SOD (PORK) LOCK FLUSH 100 UNIT/ML IV SOLN
INTRAVENOUS | Status: AC
  Filled 2020-05-08: qty 5

## 2020-05-08 MED ORDER — SODIUM CHLORIDE 0.9% FLUSH
10.0000 mL | INTRAVENOUS | Status: DC | PRN
  Administered 2020-05-08: 10 mL via INTRAVENOUS
  Filled 2020-05-08: qty 10

## 2020-05-08 NOTE — Progress Notes (Signed)
Hematology/Oncology Follow up note [facility] [phone] [phone]) [phone]   Patient Care Team: Valerie Roys, DO as PCP - General (Family Medicine) Thornton Park, MD as Referring Physician (Orthopedic Surgery) Rico Junker, RN as Oncology Nurse Navigator Byrnett, Forest Gleason, MD (General Surgery) Earlie Server, MD as Medical Oncologist (Medical Oncology) Noreene Filbert, MD as Referring Physician (Radiation Oncology)  REASON FOR VISIT Follow up for treatment of Breast cancer, assess of chemotherapy toxicity.  HISTORY OF PRESENTING ILLNESS:  Wendy Marsh is a  64 y.o.  female with PMH listed below who was referred to me for evaluation of breast cancer  04/28/2019 diagnostic mammogram Mammogram showed right breast 6 o'clock axis breast mass, 2.9 cm corresponding to area of concern.  cT2N0  Breast mass was biopsied, and pathology showed invasive mammary carcinoma.  Grade 3 , ER/PR- HER2 -.  LVI suspicious, favor present.   #  baseline MUGA testing done and reviewed systolic function depression, LVEF 45%. Given that Adriamycin has cardiac toxicity, potentially can lead to irreversible cardiomyopathy, I discussed with patient about using non-anthracycline-containing regimen. # 05/23/2019- 09/11/2018  S/p 6 cycle of Docetaxel '75mg'$ /m2 and Carboplatin every 3 weeks [ data from Byron et al 2018 Clin Cancer Res, Pathological Response and Survival in Triple-Negative Breast Cancer Following Neoadjuvant Carboplatin plus Docetaxel.Stage I-III TNBC patients, Neoadjuvant carboplatin (AUC6) plus docetaxel (75 mg/m2) every 21 days  6 cycles. pCR was 55% and Residual cancer burden (RCB) were 13%. Excellent Three-year RFS was 90% in patients with pCR and 66% in those without pCR]    # 10/29/2018   right breast lumpectomy with  excisional biopsy of the right axillary deep sentinel lymph nodes. Pathology showed CR  ypT0, ypN0- complete  pathological remission.   # # Family history of breast cancer plus personal history of triple negative breast cancer. Testing did not reveal a pathogenic mutation in any of the genes analyzed.  BRCA negative.  VUS was detected. Does not change management.   #  INTERVAL HISTORY Wendy Marsh is a 64 y.o. female who has above history reviewed by me today presents for discussion of pathology and further management plan for triple negative breast cancer. Patient reports no new complaints.  She recently had surveillance mammogram done. Chronic arthralgia, no change. No fever, cough, chills, shortness of breath, chest pain or abdominal pain . Review of Systems  Constitutional: Negative for chills, fever, malaise/fatigue and weight loss.  HENT: Negative for nosebleeds and sore throat.   Eyes: Negative for double vision, photophobia and redness.  Respiratory: Negative for cough, shortness of breath and wheezing.   Cardiovascular: Negative for chest pain, palpitations, orthopnea and leg swelling.  Gastrointestinal: Negative for abdominal pain, blood in stool, nausea and vomiting.  Genitourinary: Negative for dysuria.  Musculoskeletal: Positive for joint pain. Negative for back pain, myalgias and neck pain.       Chronic right knee pain.  Skin: Negative for itching and rash.  Neurological: Negative for dizziness, tingling and tremors.  Endo/Heme/Allergies: Negative for environmental allergies. Does not bruise/bleed easily.  Psychiatric/Behavioral: Negative for depression and hallucinations.    MEDICAL HISTORY:  Past Medical History:  Diagnosis Date  . Arthritis   . Atrial fibrillation ([place])   . Breast cancer ([place]) 04/2018   right breast  . Dysrhythmia    afib  . Hyperlipidemia   . Hypertension   . Hypotension due to drugs 05/30/2018  . Hypothyroidism   . Lumbago   .  Osteoporosis   . Personal history of chemotherapy   . Thyroid disease     SURGICAL HISTORY: Past Surgical  History:  Procedure Laterality Date  . BREAST BIOPSY Right 05/02/2018   Invasive mammary carcinoma, grade 3, neo adj chemo  . BREAST LUMPECTOMY Right 10/29/2018   NO RESIDUAL MALIGNANCY IDENTIFIED.   Marland Kitchen BREAST LUMPECTOMY WITH NEEDLE LOCALIZATION AND AXILLARY SENTINEL LYMPH NODE BX Right 10/29/2018   Procedure: BREAST LUMPECTOMY WITH NEEDLE LOCALIZATION AND SENTINEL LYMPH NODE BX;  Surgeon: Vickie Epley, MD;  Location: [facility] ORS;  Service: General;  Laterality: Right;  . JOINT REPLACEMENT Left    tkr  . PORTACATH PLACEMENT Right 05/11/2018   Procedure: INSERTION  I.J PORT-A-CATH;  Surgeon: Robert Bellow, MD;  Location: [facility] ORS;  Service: General;  Laterality: Right;  . REPLACEMENT TOTAL KNEE      SOCIAL HISTORY: Social History   Socioeconomic History  . Marital status: Married    Spouse name: jerry  . Number of children: 3  . Years of education: Not on file  . Highest education level: Some college, no degree  Occupational History  . Not on file  Tobacco Use  . Smoking status: Never Smoker  . Smokeless tobacco: Never Used  Vaping Use  . Vaping Use: Never used  Substance and Sexual Activity  . Alcohol use: No  . Drug use: No  . Sexual activity: Yes    Birth control/protection: Post-menopausal  Other Topics Concern  . Not on file  Social History Narrative  . Not on file   Social Determinants of Health   Financial Resource Strain:   . Difficulty of Paying Living Expenses:   Food Insecurity:   . Worried About Charity fundraiser in the Last Year:   . Arboriculturist in the Last Year:   Transportation Needs:   . Film/video editor (Medical):   Marland Kitchen Lack of Transportation (Non-Medical):   Physical Activity:   . Days of Exercise per Week:   . Minutes of Exercise per Session:   Stress:   . Feeling of Stress :   Social Connections:   . Frequency of Communication with Friends and Family:   . Frequency of Social Gatherings with Friends and Family:   . Attends  Religious Services:   . Active Member of Clubs or Organizations:   . Attends Archivist Meetings:   Marland Kitchen Marital Status:   Intimate Partner Violence:   . Fear of Current or Ex-Partner:   . Emotionally Abused:   Marland Kitchen Physically Abused:   . Sexually Abused:     FAMILY HISTORY: Family History  Problem Relation Age of Onset  . Diabetes Mother   . Congestive Heart Failure Mother   . Breast cancer Maternal Grandmother        dx 39s; deceased 65s  . Cancer Maternal Uncle 64       deceased 25s  . Other Father        No information on father or paternal relatives  . Breast cancer Other 10       paternal half-sister; also esophageal ca at 78; currently 43  . Diabetes Other   . Hypertension Other   . Kidney disease Other   . Thyroid disease Other     ALLERGIES:  is allergic to gabapentin, morphine and related, peanut-containing drug products, and tape.  MEDICATIONS:  Current Outpatient Medications  Medication Sig Dispense Refill  . acetaminophen (TYLENOL) 500 MG tablet Take 500 mg by  mouth every 6 (six) hours as needed.    . Ascorbic Acid (VITAMIN C) 1000 MG tablet Take 1,000 mg by mouth daily.    . Calcium Carbonate-Vit D-Min (CALCIUM 1200 PO) Take by mouth.    . Cholecalciferol (VITAMIN D) 50 MCG (2000 UT) CAPS Take by mouth.    . flecainide (TAMBOCOR) 50 MG tablet Take 50 mg by mouth 2 (two) times daily.     Marland Kitchen levothyroxine (SYNTHROID) 175 MCG tablet Take 1 tablet (175 mcg total) by mouth daily before breakfast. 90 tablet 3  . lidocaine-prilocaine (EMLA) cream APPLY TO AFFECTED AREA ONCE AS DIRECTED    . lisinopril-hydrochlorothiazide (ZESTORETIC) 20-25 MG tablet Take 2 tablets by mouth daily. 180 tablet 1  . metoprolol succinate (TOPROL-XL) 50 MG 24 hr tablet TAKE 1 TABLET BY MOUTH ONCE DAILY WITH  OR  IMMEDIATELY  FOLLOWING  A  MEAL 90 tablet 0  . naproxen sodium (ALEVE) 220 MG tablet Take 440 mg by mouth daily as needed (for pain or headache).    . Nutritional Supplements  (JUICE PLUS FIBRE PO) Take 2-4 each by mouth daily.     Marland Kitchen EPINEPHrine 0.3 mg/0.3 mL IJ SOAJ injection Inject 0.3 mg into the muscle as needed for anaphylaxis.  (Patient not taking: Reported on 05/08/2020)  12  . potassium chloride SA (K-DUR,KLOR-CON) 20 MEQ tablet Take 1 tablet (20 mEq total) by mouth daily. (Patient not taking: Reported on 09/12/2019) 90 tablet 1   Current Facility-Administered Medications  Medication Dose Route Frequency Provider Last Rate Last Admin  . sodium chloride flush (NS) 0.9 % injection 10 mL  10 mL Intravenous PRN Earlie Server, MD   10 mL at 05/08/20 1255     PHYSICAL EXAMINATION: ECOG PERFORMANCE STATUS: 1 - Symptomatic but completely ambulatory Vitals:   05/08/20 1310  BP: (!) 159/79  Pulse: 63  Temp: (!) 97.3 F (36.3 C)  SpO2: 97%   Filed Weights   05/08/20 1310  Weight: (!) 313 lb 3.2 oz (142.1 kg)    Physical Exam Constitutional:      General: She is not in acute distress.    Appearance: She is obese.     Comments: Obese, walks with a walker  HENT:     Head: Normocephalic and atraumatic.  Eyes:     General: No scleral icterus.    Pupils: Pupils are equal, round, and reactive to light.  Cardiovascular:     Rate and Rhythm: Normal rate and regular rhythm.     Heart sounds: Normal heart sounds.  Pulmonary:     Effort: Pulmonary effort is normal. No respiratory distress.     Breath sounds: No wheezing.  Abdominal:     General: Bowel sounds are normal. There is no distension.     Palpations: Abdomen is soft. There is no mass.     Tenderness: There is no abdominal tenderness.  Musculoskeletal:        General: No deformity. Normal range of motion.     Cervical back: Normal range of motion and neck supple.     Comments: Chronic lower extremity edema.  1+  Skin:    General: Skin is warm and dry.     Findings: No erythema or rash.  Neurological:     Mental Status: She is alert and oriented to person, place, and time. Mental status is at  baseline.     Cranial Nerves: No cranial nerve deficit.     Coordination: Coordination normal.  Psychiatric:  Mood and Affect: Mood normal.       LABORATORY DATA:  I have reviewed the data as listed Lab Results  Component Value Date   WBC 10.0 05/08/2020   HGB 11.9 (L) 05/08/2020   HCT 36.8 05/08/2020   MCV 85.6 05/08/2020   PLT 286 05/08/2020   Recent Labs    09/12/19 1019 12/09/19 1117 05/08/20 1244  NA 138 137 138  K 3.3* 3.2* 3.8  CL 99 97* 98  CO2 '27 28 31  '$ GLUCOSE 95 108* 103*  BUN '17 16 20  '$ CREATININE 0.85 0.88 1.00  CALCIUM 9.0 8.8* 8.9  GFRNONAA >60 >60 59*  GFRAA >60 >60 >60  PROT 7.6 7.8 7.5  ALBUMIN 3.6 3.7 3.7  AST '18 21 18  '$ ALT '15 16 16  '$ ALKPHOS 72 72 68  BILITOT 0.5 0.4 0.6   RADIOGRAPHIC STUDIES: I have personally reviewed the radiological images as listed and agreed with the findings in the report. NM Cardiac Muga rest: Calculated LEFT ventricular ejection fraction equals 45%   ASSESSMENT & PLAN:  1. Malignant neoplasm of lower-outer quadrant of right female breast, unspecified estrogen receptor status ([place])   2. 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 Normocytic anemia   Cancer Staging Malignant neoplasm of lower-outer quadrant of right female breast [facility]) Staging form: Breast, AJCC 8th Edition - Clinical stage from 05/08/2018: Stage IIB (cT2, cN0, cM0, G3, ER-, PR-, HER2-) - Signed by Earlie Server, MD on 05/08/2018 - Pathologic stage from 11/14/2018: No Stage Recommended (ypT0, pN0, cM0, ER-, PR-, HER2-) - Signed by Earlie Server, MD on 11/14/2018 Labs are reviewed and discussed with patient.  #Triple negative breast cancer,  Status post neoadjuvant chemotherapy with 6 cycles of Doxetaxol and carboplatin. .  Status post right lumpectomy with excisional biopsy of sentinel lymph node. S/p radiation.   Complete pathological remission,  ypT0 ypN0 Patient is clinically doing well.  Mammograms were independently reviewed by me and  discussed with patient. 04/29/2020 bilateral diagnostic mammogram and ultrasound showed probably benign grouped calcifications at the anterior aspect of the lumpectomy sites in the right breast.  Also probably benign small masses in the left breast at 2:70 probably a complicated cyst.  Recommend bilateral mammogram in 6 months as well as left breast ultrasound in 6 months.  Marland Kitchen #Chronic hypo-kalemia, potassium has improved.  Continue potassium enriched food intake. #Port-A-Cath in place, continue port flush.  Every 6-8 weeks #Normocytic anemia, hemoglobin 11.9, slight increase of creatinine compared to her baseline.  Encourage oral hydration.  Orders Placed This Encounter  Procedures  . MM DIAG BREAST TOMO BILATERAL    Standing Status:   Future    Standing Expiration Date:   05/08/2021    Order Specific Question:   Reason for Exam (SYMPTOM  OR DIAGNOSIS REQUIRED)    Answer:   breast cancer follow up    Order Specific Question:   Preferred imaging location?    Answer:   [facility]  . US Breast Limited Uni Left Inc Axilla    Standing Status:   Future    Standing Expiration Date:   05/08/2021    Order Specific Question:   Reason for Exam (SYMPTOM  OR DIAGNOSIS REQUIRED)    Answer:   breast cancer follow up    Order Specific Question:   Preferred imaging location?    Answer:   [facility]    Return of visit: 6 months with repeat CBC and CMP, [place] [address], [place] [address],  Earlie Server, MD, PhD 05/08/2020

## 2020-05-08 NOTE — Progress Notes (Signed)
Pt here for follow up. No new concerns voiced.   

## 2020-06-15 ENCOUNTER — Other Ambulatory Visit: Payer: Self-pay

## 2020-06-15 ENCOUNTER — Inpatient Hospital Stay: Payer: Medicare Other | Attending: Oncology

## 2020-06-15 DIAGNOSIS — Z452 Encounter for adjustment and management of vascular access device: Secondary | ICD-10-CM | POA: Diagnosis present

## 2020-06-15 DIAGNOSIS — C50811 Malignant neoplasm of overlapping sites of right female breast: Secondary | ICD-10-CM | POA: Insufficient documentation

## 2020-06-15 DIAGNOSIS — Z95828 Presence of other vascular implants and grafts: Secondary | ICD-10-CM

## 2020-06-15 DIAGNOSIS — Z171 Estrogen receptor negative status [ER-]: Secondary | ICD-10-CM | POA: Insufficient documentation

## 2020-06-15 MED ORDER — SODIUM CHLORIDE 0.9% FLUSH
10.0000 mL | Freq: Once | INTRAVENOUS | Status: AC
  Administered 2020-06-15: 10 mL via INTRAVENOUS
  Filled 2020-06-15: qty 10

## 2020-06-15 MED ORDER — HEPARIN SOD (PORK) LOCK FLUSH 100 UNIT/ML IV SOLN
INTRAVENOUS | Status: AC
  Filled 2020-06-15: qty 5

## 2020-06-15 MED ORDER — HEPARIN SOD (PORK) LOCK FLUSH 100 UNIT/ML IV SOLN
500.0000 [IU] | Freq: Once | INTRAVENOUS | Status: AC
  Administered 2020-06-15: 500 [IU] via INTRAVENOUS
  Filled 2020-06-15: qty 5

## 2020-06-16 ENCOUNTER — Other Ambulatory Visit: Payer: Self-pay

## 2020-06-16 ENCOUNTER — Ambulatory Visit (INDEPENDENT_AMBULATORY_CARE_PROVIDER_SITE_OTHER): Payer: Medicare Other | Admitting: Family Medicine

## 2020-06-16 ENCOUNTER — Encounter: Payer: Self-pay | Admitting: Family Medicine

## 2020-06-16 VITALS — BP 128/70 | HR 65 | Temp 98.5°F | Ht 62.0 in | Wt 311.0 lb

## 2020-06-16 DIAGNOSIS — Z780 Asymptomatic menopausal state: Secondary | ICD-10-CM

## 2020-06-16 DIAGNOSIS — Z136 Encounter for screening for cardiovascular disorders: Secondary | ICD-10-CM

## 2020-06-16 DIAGNOSIS — Z0000 Encounter for general adult medical examination without abnormal findings: Secondary | ICD-10-CM | POA: Diagnosis not present

## 2020-06-16 DIAGNOSIS — Z23 Encounter for immunization: Secondary | ICD-10-CM | POA: Diagnosis not present

## 2020-06-16 DIAGNOSIS — I10 Essential (primary) hypertension: Secondary | ICD-10-CM | POA: Diagnosis not present

## 2020-06-16 DIAGNOSIS — C50511 Malignant neoplasm of lower-outer quadrant of right female breast: Secondary | ICD-10-CM

## 2020-06-16 DIAGNOSIS — I480 Paroxysmal atrial fibrillation: Secondary | ICD-10-CM

## 2020-06-16 DIAGNOSIS — E039 Hypothyroidism, unspecified: Secondary | ICD-10-CM | POA: Diagnosis not present

## 2020-06-16 DIAGNOSIS — Z171 Estrogen receptor negative status [ER-]: Secondary | ICD-10-CM

## 2020-06-16 DIAGNOSIS — Z1211 Encounter for screening for malignant neoplasm of colon: Secondary | ICD-10-CM

## 2020-06-16 DIAGNOSIS — E782 Mixed hyperlipidemia: Secondary | ICD-10-CM

## 2020-06-16 DIAGNOSIS — C50919 Malignant neoplasm of unspecified site of unspecified female breast: Secondary | ICD-10-CM

## 2020-06-16 DIAGNOSIS — Z131 Encounter for screening for diabetes mellitus: Secondary | ICD-10-CM

## 2020-06-16 LAB — URINALYSIS, ROUTINE W REFLEX MICROSCOPIC
Bilirubin, UA: NEGATIVE
Glucose, UA: NEGATIVE
Ketones, UA: NEGATIVE
Leukocytes,UA: NEGATIVE
Nitrite, UA: NEGATIVE
Protein,UA: NEGATIVE
RBC, UA: NEGATIVE
Specific Gravity, UA: 1.015 (ref 1.005–1.030)
Urobilinogen, Ur: 0.2 mg/dL (ref 0.2–1.0)
pH, UA: 5.5 (ref 5.0–7.5)

## 2020-06-16 LAB — MICROALBUMIN, URINE WAIVED
Creatinine, Urine Waived: 100 mg/dL (ref 10–300)
Microalb, Ur Waived: 10 mg/L (ref 0–19)
Microalb/Creat Ratio: <30 mg/g (ref <30)

## 2020-06-16 LAB — BAYER DCA HB A1C WAIVED: HB A1C (BAYER DCA - WAIVED): 5 % (ref <7.0)

## 2020-06-16 MED ORDER — METOPROLOL SUCCINATE ER 50 MG PO TB24: ORAL_TABLET | ORAL | 1 refills | Status: DC

## 2020-06-16 MED ORDER — EPINEPHRINE 0.3 MG/0.3ML IJ SOAJ: 0.3000 mg | INTRAMUSCULAR | 12 refills | Status: DC | PRN

## 2020-06-16 MED ORDER — LISINOPRIL-HYDROCHLOROTHIAZIDE 20-25 MG PO TABS: 2.0000 | ORAL_TABLET | Freq: Every day | ORAL | 1 refills | Status: DC

## 2020-06-16 NOTE — Progress Notes (Signed)
BP 128/70    Pulse 65    Temp 98.5 F (36.9 C) (Oral)    Ht 5\' 2"  (1.575 m)    Wt (!) 311 lb (141.1 kg)    LMP 03/17/2009 (Approximate)    SpO2 92%    BMI 56.88 kg/m    Subjective:    Patient ID: Wendy Marsh, female    DOB: Feb 17, 1956, 64 y.o.   MRN: [id number]  HPI: Wendy Marsh is a 64 y.o. female presenting on 06/16/2020 for comprehensive medical examination. Current medical complaints include:  HYPERTENSION / HYPERLIPIDEMIA Satisfied with current treatment? yes Duration of hypertension: chronic BP monitoring frequency: not checking BP medication side effects: no Past BP meds: metoprolol, lisinopril-HCTZ Duration of hyperlipidemia: chronic Cholesterol medication side effects: no Cholesterol supplements: none Past cholesterol medications: none Medication compliance: excellent compliance Aspirin: no Recent stressors: no Recurrent headaches: no Visual changes: no Palpitations: no Dyspnea: no Chest pain: no Lower extremity edema: no Dizzy/lightheaded: no  HYPOTHYROIDISM- didn't take it diligently in August Thyroid control status: unsure Satisfied with current treatment? unsure Medication side effects: no Medication compliance: fair compliance Recent dose adjustment:no Fatigue: yes Cold intolerance: no Heat intolerance: no Weight gain: no Weight loss: no Constipation: yes Diarrhea/loose stools: no Palpitations: yes Lower extremity edema: yes Anxiety/depressed mood: no   She currently lives with: Menopausal Symptoms: no  Functional Status Survey: Is the patient deaf or have difficulty hearing?: No Does the patient have difficulty seeing, even when wearing glasses/contacts?: Yes Does the patient have difficulty concentrating, remembering, or making decisions?: Yes Does the patient have difficulty walking or climbing stairs?: Yes Does the patient have difficulty dressing or bathing?: No Does the patient have difficulty doing errands alone such as  visiting a doctor's office or shopping?: Yes  Fall Risk  06/16/2020 05/21/2019 11/08/2018 10/25/2018 09/11/2018  Falls in the past year? 0 0 0 0 0  Number falls in past yr: - 0 - - -  Injury with Fall? - 0 - - -  Follow up Falls evaluation completed - - - -    Depression Screen Depression screen [facility] 2/9 06/16/2020 05/21/2019 03/06/2018 05/10/2017 02/26/2016  Decreased Interest 0 0 0 0 0  Down, Depressed, Hopeless 0 0 0 0 0  PHQ - 2 Score 0 0 0 0 0  Altered sleeping 0 - - - -  Tired, decreased energy 0 - - - -  Change in appetite 0 - - - -  Feeling bad or failure about yourself  0 - - - -  Trouble concentrating 0 - - - -  Moving slowly or fidgety/restless 0 - - - -  Suicidal thoughts 0 - - - -  PHQ-9 Score 0 - - - -  Difficult doing work/chores Not difficult at all - - - -   Advanced Directives Does patient have a HCPOA?    no Does patient have a living will or MOST form?  no  Past Medical History:  Past Medical History:  Diagnosis Date   Arthritis    Atrial fibrillation ([place])    Breast cancer ([place]) 04/2018   right breast   Dysrhythmia    afib   Hyperlipidemia    Hypertension    Hypotension due to drugs 05/30/2018   Hypothyroidism    Lumbago    Osteoporosis    Personal history of chemotherapy    Thyroid disease     Surgical History:  Past Surgical History:  Procedure Laterality Date   BREAST BIOPSY Right  05/02/2018   Invasive mammary carcinoma, grade 3, neo adj chemo   BREAST LUMPECTOMY Right 10/29/2018   NO RESIDUAL MALIGNANCY IDENTIFIED.    BREAST LUMPECTOMY WITH NEEDLE LOCALIZATION AND AXILLARY SENTINEL LYMPH NODE BX Right 10/29/2018   Procedure: BREAST LUMPECTOMY WITH NEEDLE LOCALIZATION AND SENTINEL LYMPH NODE BX;  Surgeon: Vickie Epley, MD;  Location: [facility] ORS;  Service: General;  Laterality: Right;   JOINT REPLACEMENT Left    tkr   PORTACATH PLACEMENT Right 05/11/2018   Procedure: INSERTION  I.J PORT-A-CATH;  Surgeon: Robert Bellow, MD;   Location: [facility] ORS;  Service: General;  Laterality: Right;   REPLACEMENT TOTAL KNEE      Medications:  Current Outpatient Medications on File Prior to Visit  Medication Sig   acetaminophen (TYLENOL) 500 MG tablet Take 500 mg by mouth every 6 (six) hours as needed.   Ascorbic Acid (VITAMIN C) 1000 MG tablet Take 1,000 mg by mouth as needed.    Cholecalciferol (VITAMIN D) 50 MCG (2000 UT) CAPS Take by mouth.   flecainide (TAMBOCOR) 50 MG tablet Take 50 mg by mouth 2 (two) times daily.    levothyroxine (SYNTHROID) 175 MCG tablet Take 1 tablet (175 mcg total) by mouth daily before breakfast.   lidocaine-prilocaine (EMLA) cream APPLY TO AFFECTED AREA ONCE AS DIRECTED   naproxen sodium (ALEVE) 220 MG tablet Take 440 mg by mouth daily as needed (for pain or headache).   Nutritional Supplements (JUICE PLUS FIBRE PO) Take 2-4 each by mouth daily.    Calcium Carbonate-Vit D-Min (CALCIUM 1200 PO) Take by mouth. (Patient not taking: Reported on 06/16/2020)   potassium chloride SA (K-DUR,KLOR-CON) 20 MEQ tablet Take 1 tablet (20 mEq total) by mouth daily. (Patient not taking: Reported on 09/12/2019)   No current facility-administered medications on file prior to visit.    Allergies:  Allergies  Allergen Reactions   Gabapentin     "makes me feel kinda out of it"   Morphine And Related Nausea And Vomiting   Peanut-Containing Drug Products Swelling   Tape Rash    Social History:  Social History   Socioeconomic History   Marital status: Married    Spouse name: jerry   Number of children: 3   Years of education: Not on file   Highest education level: Some college, no degree  Occupational History   Not on file  Tobacco Use   Smoking status: Never Smoker   Smokeless tobacco: Never Used  Scientific laboratory technician Use: Never used  Substance and Sexual Activity   Alcohol use: No   Drug use: No   Sexual activity: Yes    Birth control/protection: Post-menopausal  Other  Topics Concern   Not on file  Social History Narrative   Not on file   Social Determinants of Health   Financial Resource Strain:    Difficulty of Paying Living Expenses: Not on file  Food Insecurity:    Worried About Charity fundraiser in the Last Year: Not on file   [employer] of Food in the Last Year: Not on file  Transportation Needs:    Lack of Transportation (Medical): Not on file   Lack of Transportation (Non-Medical): Not on file  Physical Activity:    Days of Exercise per Week: Not on file   Minutes of Exercise per Session: Not on file  Stress:    Feeling of Stress : Not on file  Social Connections:    Frequency of Communication with Friends and  Family: Not on file   Frequency of Social Gatherings with Friends and Family: Not on file   Attends Religious Services: Not on file   Active Member of Clubs or Organizations: Not on file   Attends Archivist Meetings: Not on file   Marital Status: Not on file  Intimate Partner Violence:    Fear of Current or Ex-Partner: Not on file   Emotionally Abused: Not on file   Physically Abused: Not on file   Sexually Abused: Not on file   Social History   Tobacco Use  Smoking Status Never Smoker  Smokeless Tobacco Never Used   Social History   Substance and Sexual Activity  Alcohol Use No    Family History:  Family History  Problem Relation Age of Onset   Diabetes Mother    Congestive Heart Failure Mother    Breast cancer Maternal Grandmother        dx 16s; deceased 9s   Cancer Maternal Uncle 52       deceased 58s   Other Father        No information on father or paternal relatives   Breast cancer Other 57       paternal half-sister; also esophageal ca at 70; currently 51   Diabetes Other    Hypertension Other    Kidney disease Other    Thyroid disease Other     Past medical history, surgical history, medications, allergies, family history and social history reviewed with  patient today and changes made to appropriate areas of the chart.   Review of Systems  Constitutional: Negative.   HENT: Negative.   Eyes: Positive for blurred vision. Negative for double vision, photophobia, pain, discharge and redness.  Respiratory: Negative.   Cardiovascular: Positive for palpitations and leg swelling. Negative for chest pain, orthopnea, claudication and PND.  Gastrointestinal: Negative.   Genitourinary: Negative.   Musculoskeletal: Positive for myalgias. Negative for back pain, falls, joint pain and neck pain.  Skin: Negative.   Neurological: Negative.   Endo/Heme/Allergies: Negative for environmental allergies and polydipsia. Bruises/bleeds easily.  Psychiatric/Behavioral: Negative.     All other ROS negative except what is listed above and in the HPI.      Objective:    BP 128/70    Pulse 65    Temp 98.5 F (36.9 C) (Oral)    Ht 5\' 2"  (1.575 m)    Wt (!) 311 lb (141.1 kg)    LMP 03/17/2009 (Approximate)    SpO2 92%    BMI 56.88 kg/m   Wt Readings from Last 3 Encounters:  06/16/20 (!) 311 lb (141.1 kg)  05/08/20 (!) 313 lb 3.2 oz (142.1 kg)  09/12/19 299 lb (135.6 kg)     Hearing Screening   125Hz  250Hz  500Hz  1000Hz  2000Hz  3000Hz  4000Hz  6000Hz  8000Hz   Right ear:   Pass Pass Pass  Fail    Left ear:   Pass Pass Pass  Fail      Visual Acuity Screening   Right eye Left eye Both eyes  Without correction:     With correction: 20/200 20/50     Physical Exam Vitals and nursing note reviewed.  Constitutional:      General: She is not in acute distress.    Appearance: Normal appearance. She is not ill-appearing, toxic-appearing or diaphoretic.  HENT:     Head: Normocephalic and atraumatic.     Right Ear: Tympanic membrane, ear canal and external ear normal. There is no impacted cerumen.  Left Ear: Tympanic membrane, ear canal and external ear normal. There is no impacted cerumen.     Nose: Nose normal. No congestion or rhinorrhea.     Mouth/Throat:      Mouth: Mucous membranes are moist.     Pharynx: Oropharynx is clear. No oropharyngeal exudate or posterior oropharyngeal erythema.  Eyes:     General: No scleral icterus.       Right eye: No discharge.        Left eye: No discharge.     Extraocular Movements: Extraocular movements intact.     Conjunctiva/sclera: Conjunctivae normal.     Pupils: Pupils are equal, round, and reactive to light.  Neck:     Vascular: No carotid bruit.  Cardiovascular:     Rate and Rhythm: Normal rate and regular rhythm.     Pulses: Normal pulses.     Heart sounds: No murmur heard.  No friction rub. No gallop.   Pulmonary:     Effort: Pulmonary effort is normal. No respiratory distress.     Breath sounds: Normal breath sounds. No stridor. No wheezing, rhonchi or rales.  Chest:     Chest wall: No tenderness.  Abdominal:     General: Abdomen is flat. Bowel sounds are normal. There is no distension.     Palpations: Abdomen is soft. There is no mass.     Tenderness: There is no abdominal tenderness. There is no right CVA tenderness, left CVA tenderness, guarding or rebound.     Hernia: No hernia is present.  Genitourinary:    Comments: Breast and pelvic exams deferred with shared decision making Musculoskeletal:        General: No swelling, tenderness, deformity or signs of injury.     Cervical back: Normal range of motion and neck supple. No rigidity. No muscular tenderness.     Right lower leg: No edema.     Left lower leg: No edema.  Lymphadenopathy:     Cervical: No cervical adenopathy.  Skin:    General: Skin is warm and dry.     Capillary Refill: Capillary refill takes less than 2 seconds.     Coloration: Skin is not jaundiced or pale.     Findings: No bruising, erythema, lesion or rash.  Neurological:     General: No focal deficit present.     Mental Status: She is alert and oriented to person, place, and time. Mental status is at baseline.     Cranial Nerves: No cranial nerve deficit.      Sensory: No sensory deficit.     Motor: No weakness.     Coordination: Coordination normal.     Gait: Gait normal.     Deep Tendon Reflexes: Reflexes normal.  Psychiatric:        Mood and Affect: Mood normal.        Behavior: Behavior normal.        Thought Content: Thought content normal.        Judgment: Judgment normal.     6CIT Screen 06/16/2020  What Year? 0 points  What month? 0 points  What time? 0 points  Count back from 20 0 points  Months in reverse 0 points  Repeat phrase 0 points  Total Score 0     Results for orders placed or performed in visit on 06/16/20  Bayer DCA Hb A1c Waived  Result Value Ref Range   HB A1C (BAYER DCA - WAIVED) 5.0 <7.0 %  CBC with Differential/Platelet  Result Value Ref Range   WBC 8.1 3.4 - 10.8 x10E3/uL   RBC 3.89 3.77 - 5.28 x10E6/uL   Hemoglobin 11.0 (L) 11.1 - 15.9 g/dL   Hematocrit 33.8 (L) 34.0 - 46.6 %   MCV 87 79 - 97 fL   MCH 28.3 26.6 - 33.0 pg   MCHC 32.5 31 - 35 g/dL   RDW 14.1 11.7 - 15.4 %   Platelets 291 150 - 450 x10E3/uL   Neutrophils 73 Not Estab. %   Lymphs 16 Not Estab. %   Monocytes 6 Not Estab. %   Eos 4 Not Estab. %   Basos 1 Not Estab. %   Neutrophils Absolute 5.9 1 - 7 x10E3/uL   Lymphocytes Absolute 1.3 0 - 3 x10E3/uL   Monocytes Absolute 0.5 0 - 0 x10E3/uL   EOS (ABSOLUTE) 0.3 0.0 - 0.4 x10E3/uL   Basophils Absolute 0.0 0 - 0 x10E3/uL   Immature Granulocytes 0 Not Estab. %   Immature Grans (Abs) 0.0 0.0 - 0.1 x10E3/uL  Comprehensive metabolic panel  Result Value Ref Range   Glucose 90 65 - 99 mg/dL   BUN 18 8 - 27 mg/dL   Creatinine, Ser 0.84 0.57 - 1.00 mg/dL   GFR calc non Af Amer 73 >59 mL/min/1.73   GFR calc Af Amer 84 >59 mL/min/1.73   BUN/Creatinine Ratio 21 12 - 28   Sodium 141 134 - 144 mmol/L   Potassium 3.6 3.5 - 5.2 mmol/L   Chloride 99 96 - 106 mmol/L   CO2 26 20 - 29 mmol/L   Calcium 9.1 8.7 - 10.3 mg/dL   Total Protein 6.7 6.0 - 8.5 g/dL   Albumin 3.9 3.8 - 4.8 g/dL    Globulin, Total 2.8 1.5 - 4.5 g/dL   Albumin/Globulin Ratio 1.4 1.2 - 2.2   Bilirubin Total 0.3 0.0 - 1.2 mg/dL   Alkaline Phosphatase 80 44 - 121 IU/L   AST 14 0 - 40 IU/L   ALT 16 0 - 32 IU/L  Lipid Panel w/o Chol/HDL Ratio  Result Value Ref Range   Cholesterol, Total 194 100 - 199 mg/dL   Triglycerides 129 0 - 149 mg/dL   HDL 45 >39 mg/dL   VLDL Cholesterol Cal 23 5 - 40 mg/dL   LDL Chol Calc (NIH) 126 (H) 0 - 99 mg/dL  Microalbumin, Urine Waived  Result Value Ref Range   Microalb, Ur Waived 10 0 - 19 mg/L   Creatinine, Urine Waived 100 10 - 300 mg/dL   Microalb/Creat Ratio <30 <30 mg/g  TSH  Result Value Ref Range   TSH 5.550 (H) 0.450 - 4.500 uIU/mL  Urinalysis, Routine w reflex microscopic  Result Value Ref Range   Specific Gravity, UA 1.015 1.005 - 1.030   pH, UA 5.5 5.0 - 7.5   Color, UA Yellow Yellow   Appearance Ur Clear Clear   Leukocytes,UA Negative Negative   Protein,UA Negative Negative/Trace   Glucose, UA Negative Negative   Ketones, UA Negative Negative   RBC, UA Negative Negative   Bilirubin, UA Negative Negative   Urobilinogen, Ur 0.2 0.2 - 1.0 mg/dL   Nitrite, UA Negative Negative      Assessment & Plan:   Problem List Items Addressed This Visit      Cardiovascular and Mediastinum   Paroxysmal atrial fibrillation (HCC)    HR in normal range today. Continue to follow with cardiology. Call with any concerns. Continue to monitor.       Relevant  Medications   metoprolol succinate (TOPROL-XL) 50 MG 24 hr tablet   lisinopril-hydrochlorothiazide (ZESTORETIC) 20-25 MG tablet   EPINEPHrine 0.3 mg/0.3 mL IJ SOAJ injection   Other Relevant Orders   CBC with Differential/Platelet (Completed)   Comprehensive metabolic panel (Completed)   Benign essential hypertension    Under good control on current regimen. Continue current regimen. Continue to monitor. Call with any concerns. Refills given. Labs drawn today.        Relevant Medications   metoprolol  succinate (TOPROL-XL) 50 MG 24 hr tablet   lisinopril-hydrochlorothiazide (ZESTORETIC) 20-25 MG tablet   EPINEPHrine 0.3 mg/0.3 mL IJ SOAJ injection   Other Relevant Orders   Comprehensive metabolic panel (Completed)   Microalbumin, Urine Waived (Completed)   Urinalysis, Routine w reflex microscopic (Completed)     Endocrine   Thyroid activity decreased    Labs drawn today. Await results. Treat as needed. Encouraged to take daily.       Relevant Medications   metoprolol succinate (TOPROL-XL) 50 MG 24 hr tablet   Other Relevant Orders   Comprehensive metabolic panel (Completed)   TSH (Completed)     Other   Combined fat and carbohydrate induced hyperlipemia    Under good control on current regimen. Continue current regimen. Continue to monitor. Call with any concerns. Refills given. Labs drawn today.       Relevant Medications   metoprolol succinate (TOPROL-XL) 50 MG 24 hr tablet   lisinopril-hydrochlorothiazide (ZESTORETIC) 20-25 MG tablet   EPINEPHrine 0.3 mg/0.3 mL IJ SOAJ injection   Other Relevant Orders   Comprehensive metabolic panel (Completed)   Lipid Panel w/o Chol/HDL Ratio (Completed)   Morbid obesity (HCC)    Encouraged diet and exercise with goal of losing 1-2lbs per week. Call with any concerns.       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 Continue to follow with oncology. Call with any concerns.       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 Continue to follow with oncology. Call with any concerns.       Relevant Orders   Comprehensive metabolic panel (Completed)    Other Visit Diagnoses    Welcome to Medicare preventive visit    -  Primary   Preventative care discussed today as below.    Screening for cardiovascular condition       Labs drawn today.  Await results. Treat as needed.    Relevant Orders   EKG 12-Lead (Completed)   Screening for diabetes mellitus       Labs drawn today. Await results.    Relevant Orders   Bayer DCA Hb A1c Waived (Completed)   Comprehensive metabolic panel (Completed)   Postmenopausal estrogen deficiency       Will order DEXA, await results.    Relevant Orders   DG Bone Density   Essential hypertension       Relevant Medications   metoprolol succinate (TOPROL-XL) 50 MG 24 hr tablet   lisinopril-hydrochlorothiazide (ZESTORETIC) 20-25 MG tablet   EPINEPHrine 0.3 mg/0.3 mL IJ SOAJ injection   Screening for colon cancer       Cologuard ordered today. Await results.    Relevant Orders   Cologuard   Needs flu shot       Flu shot given today.    Relevant  Orders   Flu Vaccine QUAD High Dose(Fluad) (Completed)       Preventative Services:  AAA screening: N/A Health Risk Assessment and Personalized Prevention Plan: done today Bone Mass Measurements: Ordered today Breast Cancer Screening: Up to date CVD Screening: Done today Cervical Cancer Screening:up to date Colon Cancer Screening: Ordered today Depression Screening: Done today Diabetes Screening: Done today Glaucoma Screening: See your eye doctor Hepatitis B vaccine: N/A Hepatitis C screening: Up to date HIV Screening: Up to date Flu Vaccine: Done today Lung cancer Screening: N/A Obesity Screening: Done today Pneumonia Vaccines (2): Prevnar given today. Due for pneumovax next year STI Screening: N/A  Follow up plan: Return in about 6 months (around 12/14/2020).   LABORATORY TESTING:  - Pap smear: up to date  IMMUNIZATIONS:   - Tdap: Tetanus vaccination status reviewed: last tetanus booster within 10 years. - Influenza: Administered today - Pneumovax: Not applicable - Prevnar: Administered today - COVID: Up to date  SCREENING: -Mammogram: Up to date  - Colonoscopy: Ordered today  - Bone Density: Ordered today  -Hearing Test: Up  to date   PATIENT COUNSELING:   Advised to take 1 mg of folate supplement per day if capable of pregnancy.   Sexuality: Discussed sexually transmitted diseases, partner selection, use of condoms, avoidance of unintended pregnancy  and contraceptive alternatives.   Advised to avoid cigarette smoking.  I discussed with the patient that most people either abstain from alcohol or drink within safe limits (<=14/week and <=4 drinks/occasion for males, <=7/weeks and <= 3 drinks/occasion for females) and that the risk for alcohol disorders and other health effects rises proportionally with the number of drinks per week and how often a drinker exceeds daily limits.  Discussed cessation/primary prevention of drug use and availability of treatment for abuse.   Diet: Encouraged to adjust caloric intake to maintain  or achieve ideal body weight, to reduce intake of dietary saturated fat and total fat, to limit sodium intake by avoiding high sodium foods and not adding table salt, and to maintain adequate dietary potassium and calcium preferably from fresh fruits, vegetables, and low-fat dairy products.    stressed the importance of regular exercise  Injury prevention: Discussed safety belts, safety helmets, smoke detector, smoking near bedding or upholstery.   Dental health: Discussed importance of regular tooth brushing, flossing, and dental visits.    NEXT PREVENTATIVE PHYSICAL DUE IN 1 YEAR. Return in about 6 months (around 12/14/2020).

## 2020-06-17 LAB — CBC WITH DIFFERENTIAL/PLATELET
Basophils Absolute: 0 10*3/uL (ref 0.0–0.2)
Basos: 1 %
EOS (ABSOLUTE): 0.3 10*3/uL (ref 0.0–0.4)
Eos: 4 %
Hematocrit: 33.8 % — ABNORMAL LOW (ref 34.0–46.6)
Hemoglobin: 11 g/dL — ABNORMAL LOW (ref 11.1–15.9)
Immature Grans (Abs): 0 10*3/uL (ref 0.0–0.1)
Immature Granulocytes: 0 %
Lymphocytes Absolute: 1.3 10*3/uL (ref 0.7–3.1)
Lymphs: 16 %
MCH: 28.3 pg (ref 26.6–33.0)
MCHC: 32.5 g/dL (ref 31.5–35.7)
MCV: 87 fL (ref 79–97)
Monocytes Absolute: 0.5 10*3/uL (ref 0.1–0.9)
Monocytes: 6 %
Neutrophils Absolute: 5.9 10*3/uL (ref 1.4–7.0)
Neutrophils: 73 %
Platelets: 291 10*3/uL (ref 150–450)
RBC: 3.89 x10E6/uL (ref 3.77–5.28)
RDW: 14.1 % (ref 11.7–15.4)
WBC: 8.1 10*3/uL (ref 3.4–10.8)

## 2020-06-17 LAB — COMPREHENSIVE METABOLIC PANEL
ALT: 16 IU/L (ref 0–32)
AST: 14 IU/L (ref 0–40)
Albumin/Globulin Ratio: 1.4 (ref 1.2–2.2)
Albumin: 3.9 g/dL (ref 3.8–4.8)
Alkaline Phosphatase: 80 IU/L (ref 44–121)
BUN/Creatinine Ratio: 21 (ref 12–28)
BUN: 18 mg/dL (ref 8–27)
Bilirubin Total: 0.3 mg/dL (ref 0.0–1.2)
CO2: 26 mmol/L (ref 20–29)
Calcium: 9.1 mg/dL (ref 8.7–10.3)
Chloride: 99 mmol/L (ref 96–106)
Creatinine, Ser: 0.84 mg/dL (ref 0.57–1.00)
GFR calc Af Amer: 84 mL/min/{1.73_m2} (ref >59)
GFR calc non Af Amer: 73 mL/min/{1.73_m2} (ref >59)
Globulin, Total: 2.8 g/dL (ref 1.5–4.5)
Glucose: 90 mg/dL (ref 65–99)
Potassium: 3.6 mmol/L (ref 3.5–5.2)
Sodium: 141 mmol/L (ref 134–144)
Total Protein: 6.7 g/dL (ref 6.0–8.5)

## 2020-06-17 LAB — LIPID PANEL W/O CHOL/HDL RATIO
Cholesterol, Total: 194 mg/dL (ref 100–199)
HDL: 45 mg/dL (ref >39)
LDL Chol Calc (NIH): 126 mg/dL — ABNORMAL HIGH (ref 0–99)
Triglycerides: 129 mg/dL (ref 0–149)
VLDL Cholesterol Cal: 23 mg/dL (ref 5–40)

## 2020-06-17 LAB — TSH: TSH: 5.55 u[IU]/mL — ABNORMAL HIGH (ref 0.450–4.500)

## 2020-06-21 ENCOUNTER — Encounter: Payer: Self-pay | Admitting: Family Medicine

## 2020-06-21 NOTE — Assessment & Plan Note (Signed)
Under good control on current regimen. Continue current regimen. Continue to monitor. Call with any concerns. Refills given. Labs drawn today.   

## 2020-06-21 NOTE — Assessment & Plan Note (Signed)
HR in normal range today. Continue to follow with cardiology. Call with any concerns. Continue to monitor.

## 2020-06-21 NOTE — Assessment & Plan Note (Signed)
Encouraged diet and exercise with goal of losing 1-2lbs per week. Call with any concerns.  

## 2020-06-21 NOTE — Assessment & Plan Note (Signed)
Labs drawn today. Await results. Treat as needed. Encouraged to take daily.

## 2020-06-21 NOTE — Assessment & Plan Note (Signed)
Doing well. Continue to follow with oncology. Call with any concerns.  

## 2020-06-23 ENCOUNTER — Telehealth: Payer: Self-pay

## 2020-06-23 NOTE — Telephone Encounter (Signed)
-----   Message from Valerie Roys, [place] sent at 06/21/2020 10:55 PM EDT ----- Patient has appointment for her mammogram scheduled- can we see if we can add a DEXA to same appt?

## 2020-06-23 NOTE — Telephone Encounter (Signed)
Called [facility] and they wanted to know  if patient has had a dexa scan before. Called patient to verify this and LVM for her to return my call with this information so we can see if [facility] can add on dexa to the same apt.

## 2020-06-24 NOTE — Telephone Encounter (Signed)
Called patient and LVM for her to return my call.

## 2020-06-24 NOTE — Telephone Encounter (Signed)
Patient scheduled for a dexa scan on same day of her mammogram on 11/03/19. Mammogram at 1:40 pm and Dexa at 2:20 pm. Patient notified

## 2020-06-28 NOTE — Progress Notes (Signed)
Intrepreted by me on 06/16/20. 1st degree AV block at 70bmp

## 2020-07-21 ENCOUNTER — Other Ambulatory Visit: Payer: Medicare Other

## 2020-07-21 ENCOUNTER — Other Ambulatory Visit: Payer: Self-pay

## 2020-07-21 ENCOUNTER — Ambulatory Visit (INDEPENDENT_AMBULATORY_CARE_PROVIDER_SITE_OTHER): Payer: Medicare Other | Admitting: Family Medicine

## 2020-07-21 DIAGNOSIS — E039 Hypothyroidism, unspecified: Secondary | ICD-10-CM

## 2020-07-21 DIAGNOSIS — D649 Anemia, unspecified: Secondary | ICD-10-CM

## 2020-07-21 NOTE — Progress Notes (Signed)
error 

## 2020-07-22 ENCOUNTER — Other Ambulatory Visit: Payer: Self-pay | Admitting: Family Medicine

## 2020-07-22 LAB — CBC WITH DIFFERENTIAL/PLATELET
Basophils Absolute: 0 10*3/uL (ref 0.0–0.2)
Basos: 0 %
EOS (ABSOLUTE): 0.3 10*3/uL (ref 0.0–0.4)
Eos: 3 %
Hematocrit: 34.1 % (ref 34.0–46.6)
Hemoglobin: 11.1 g/dL (ref 11.1–15.9)
Immature Grans (Abs): 0 10*3/uL (ref 0.0–0.1)
Immature Granulocytes: 0 %
Lymphocytes Absolute: 1.5 10*3/uL (ref 0.7–3.1)
Lymphs: 17 %
MCH: 28.2 pg (ref 26.6–33.0)
MCHC: 32.6 g/dL (ref 31.5–35.7)
MCV: 87 fL (ref 79–97)
Monocytes Absolute: 0.7 10*3/uL (ref 0.1–0.9)
Monocytes: 7 %
Neutrophils Absolute: 6.4 10*3/uL (ref 1.4–7.0)
Neutrophils: 73 %
Platelets: 273 10*3/uL (ref 150–450)
RBC: 3.94 x10E6/uL (ref 3.77–5.28)
RDW: 13.8 % (ref 11.7–15.4)
WBC: 8.9 10*3/uL (ref 3.4–10.8)

## 2020-07-22 LAB — TSH: TSH: 0.875 u[IU]/mL (ref 0.450–4.500)

## 2020-07-22 MED ORDER — LEVOTHYROXINE SODIUM 175 MCG PO TABS
175.0000 ug | ORAL_TABLET | Freq: Every day | ORAL | 3 refills | Status: DC
Start: 2020-07-22 — End: 2021-09-07

## 2020-07-27 ENCOUNTER — Inpatient Hospital Stay: Payer: Medicare Other | Attending: Oncology

## 2020-07-27 ENCOUNTER — Other Ambulatory Visit: Payer: Self-pay

## 2020-07-27 DIAGNOSIS — Z95828 Presence of other vascular implants and grafts: Secondary | ICD-10-CM

## 2020-07-27 DIAGNOSIS — Z452 Encounter for adjustment and management of vascular access device: Secondary | ICD-10-CM | POA: Diagnosis present

## 2020-07-27 DIAGNOSIS — C50511 Malignant neoplasm of lower-outer quadrant of right female breast: Secondary | ICD-10-CM | POA: Diagnosis present

## 2020-07-27 DIAGNOSIS — Z171 Estrogen receptor negative status [ER-]: Secondary | ICD-10-CM | POA: Diagnosis not present

## 2020-07-27 MED ORDER — HEPARIN SOD (PORK) LOCK FLUSH 100 UNIT/ML IV SOLN
500.0000 [IU] | Freq: Once | INTRAVENOUS | Status: AC
  Administered 2020-07-27: 500 [IU] via INTRAVENOUS
  Filled 2020-07-27: qty 5

## 2020-07-27 MED ORDER — HEPARIN SOD (PORK) LOCK FLUSH 100 UNIT/ML IV SOLN
INTRAVENOUS | Status: AC
  Filled 2020-07-27: qty 5

## 2020-07-27 MED ORDER — SODIUM CHLORIDE 0.9% FLUSH
10.0000 mL | Freq: Once | INTRAVENOUS | Status: AC
  Administered 2020-07-27: 10 mL via INTRAVENOUS
  Filled 2020-07-27: qty 10

## 2020-08-18 ENCOUNTER — Encounter: Payer: Self-pay | Admitting: Radiation Oncology

## 2020-08-18 ENCOUNTER — Ambulatory Visit
Admission: RE | Admit: 2020-08-18 | Discharge: 2020-08-18 | Disposition: A | Discharge status: Home / Self Care | Payer: Medicare Other | Source: Ambulatory Visit | Attending: Radiation Oncology | Admitting: Radiation Oncology

## 2020-08-18 VITALS — BP 153/80 | HR 62 | Temp 97.0°F | Wt 302.0 lb

## 2020-08-18 DIAGNOSIS — Z853 Personal history of malignant neoplasm of breast: Secondary | ICD-10-CM | POA: Insufficient documentation

## 2020-08-18 DIAGNOSIS — Z923 Personal history of irradiation: Secondary | ICD-10-CM | POA: Insufficient documentation

## 2020-08-18 DIAGNOSIS — C50919 Malignant neoplasm of unspecified site of unspecified female breast: Secondary | ICD-10-CM

## 2020-08-18 NOTE — Progress Notes (Signed)
Radiation Oncology Follow up Note  Name: Wendy Marsh   Date:   08/18/2020 MRN:  [id number] DOB: 1956-09-12    This 64 y.o. 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 She is seen today in routine follow-up is doing well. She specifically denies breast tenderness cough or bone pain. She is not on antiestrogen therapy based on the triple negative nature of her disease.Marland Kitchen Her most recent mammogram back in July she was found to have some probable benign grouped calcifications in the anterior aspect of the lumpectomy site most likely early dystrophic calcifications. Also had a probable benign small mass in the left breast at 2 o'clock position possibly a complicated cyst. 7-month follow-up ultrasound mammogram was recommended.  COMPLICATIONS OF TREATMENT: none  FOLLOW UP COMPLIANCE: keeps appointments   PHYSICAL EXAM:  Lungs are clear to A&P cardiac examination essentially unremarkable with regular rate and rhythm. No dominant mass or nodularity is noted in either breast in 2 positions examined. Incision is well-healed. No axillary or supraclavicular adenopathy is appreciated. Cosmetic result is excellent. Patient's breasts are large and pendulous. Well-developed well-nourished patient in NAD. HEENT reveals PERLA, EOMI, discs not visualized.  Oral cavity is clear. No oral mucosal lesions are identified. Neck is clear without evidence of cervical or supraclavicular adenopathy. Lungs are clear to A&P. Cardiac examination is  essentially unremarkable with regular rate and rhythm without murmur rub or thrill. Abdomen is benign with no organomegaly or masses noted. Motor sensory and DTR levels are equal and symmetric in the upper and lower extremities. Cranial nerves II through XII are grossly intact. Proprioception is intact. No peripheral adenopathy or edema is identified. No motor or sensory levels are noted. Crude visual fields are within normal range.  RADIOLOGY RESULTS: Mammograms reviewed compatible with above-stated findings  PLAN: At this time patient continues to do well with no evidence of disease. I am pleased with her overall progress. Of asked her to reestablish care with Dr. Tollie Pizza her original surgeon so we can track and follow some of these findings on her most recent mammogram. I have otherwise asked to see her back in 1 year for follow-up. Patient knows to call at anytime with any concerns.  I would like to take this opportunity to thank you for allowing me to participate in the care of your patient.Noreene Filbert, MD

## 2020-09-02 DIAGNOSIS — Z8616 Personal history of COVID-19: Secondary | ICD-10-CM

## 2020-09-02 HISTORY — DX: Personal history of COVID-19: Z86.16

## 2020-09-04 ENCOUNTER — Other Ambulatory Visit: Payer: Self-pay | Admitting: Oncology

## 2020-09-14 ENCOUNTER — Inpatient Hospital Stay: Payer: Medicare Other | Attending: Oncology

## 2020-09-14 ENCOUNTER — Other Ambulatory Visit: Payer: Self-pay

## 2020-09-14 DIAGNOSIS — C50511 Malignant neoplasm of lower-outer quadrant of right female breast: Secondary | ICD-10-CM | POA: Diagnosis not present

## 2020-09-14 DIAGNOSIS — Z171 Estrogen receptor negative status [ER-]: Secondary | ICD-10-CM | POA: Insufficient documentation

## 2020-09-14 DIAGNOSIS — Z452 Encounter for adjustment and management of vascular access device: Secondary | ICD-10-CM | POA: Diagnosis present

## 2020-09-14 DIAGNOSIS — Z95828 Presence of other vascular implants and grafts: Secondary | ICD-10-CM

## 2020-09-14 MED ORDER — SODIUM CHLORIDE 0.9% FLUSH
10.0000 mL | Freq: Once | INTRAVENOUS | Status: AC
  Administered 2020-09-14: 10 mL via INTRAVENOUS
  Filled 2020-09-14: qty 10

## 2020-09-14 MED ORDER — HEPARIN SOD (PORK) LOCK FLUSH 100 UNIT/ML IV SOLN
500.0000 [IU] | Freq: Once | INTRAVENOUS | Status: AC
  Administered 2020-09-14: 500 [IU] via INTRAVENOUS
  Filled 2020-09-14: qty 5

## 2020-09-14 MED ORDER — HEPARIN SOD (PORK) LOCK FLUSH 100 UNIT/ML IV SOLN
INTRAVENOUS | Status: AC
  Filled 2020-09-14: qty 5

## 2020-09-30 ENCOUNTER — Telehealth: Payer: Self-pay

## 2020-09-30 DIAGNOSIS — Z20822 Contact with and (suspected) exposure to covid-19: Secondary | ICD-10-CM

## 2020-09-30 NOTE — Telephone Encounter (Signed)
Set up swab and I can see her virtually at 4:20

## 2020-09-30 NOTE — Telephone Encounter (Signed)
Routing to provider  

## 2020-09-30 NOTE — Telephone Encounter (Signed)
Copied from CRM [id number]. Topic: General - Other >> Sep 30, 2020 11:16 AM Gwenlyn Fudge wrote: Reason for CRM: Pt called stating that she was in contact with 2 people who tested positive for covid. She states that she is now having a scratchy throat and is requesting to have a covid test in office. Please advise. No available apt for this week can orders be placed or scheduled for next week.

## 2020-09-30 NOTE — Addendum Note (Signed)
Addended by: Dorcas Carrow on: 09/30/2020 03:29 PM   Modules accepted: Orders

## 2020-09-30 NOTE — Telephone Encounter (Signed)
Pt stated she has already went to a drive threw testing site. Pt stated if she needs [place] she would call but at the moment did not need the apt

## 2020-10-01 ENCOUNTER — Telehealth: Payer: Self-pay | Admitting: *Deleted

## 2020-10-01 NOTE — Telephone Encounter (Signed)
Called with questions regarding her positive Covid results from yesterday. She feels better today than yesterday with only mild symptoms. Has been vaccinated. Reviewed isolation times and discussed health precautions. Patient verbalized understanding information.

## 2020-10-03 HISTORY — PX: PORT-A-CATH REMOVAL: SHX5289

## 2020-10-30 ENCOUNTER — Inpatient Hospital Stay: Payer: Medicare Other | Attending: Oncology

## 2020-10-30 ENCOUNTER — Other Ambulatory Visit: Payer: Self-pay

## 2020-10-30 DIAGNOSIS — Z171 Estrogen receptor negative status [ER-]: Secondary | ICD-10-CM | POA: Insufficient documentation

## 2020-10-30 DIAGNOSIS — C50511 Malignant neoplasm of lower-outer quadrant of right female breast: Secondary | ICD-10-CM

## 2020-10-30 DIAGNOSIS — C50811 Malignant neoplasm of overlapping sites of right female breast: Secondary | ICD-10-CM | POA: Insufficient documentation

## 2020-10-30 LAB — CBC WITH DIFFERENTIAL/PLATELET
Abs Immature Granulocytes: 0.03 10*3/uL (ref 0.00–0.07)
Basophils Absolute: 0 10*3/uL (ref 0.0–0.1)
Basophils Relative: 0 %
Eosinophils Absolute: 0.2 10*3/uL (ref 0.0–0.5)
Eosinophils Relative: 3 %
HCT: 38 % (ref 36.0–46.0)
Hemoglobin: 12.3 g/dL (ref 12.0–15.0)
Immature Granulocytes: 0 %
Lymphocytes Relative: 18 %
Lymphs Abs: 1.5 10*3/uL (ref 0.7–4.0)
MCH: 27.9 pg (ref 26.0–34.0)
MCHC: 32.4 g/dL (ref 30.0–36.0)
MCV: 86.2 fL (ref 80.0–100.0)
Monocytes Absolute: 0.6 10*3/uL (ref 0.1–1.0)
Monocytes Relative: 8 %
Neutro Abs: 6 10*3/uL (ref 1.7–7.7)
Neutrophils Relative %: 71 %
Platelets: 273 10*3/uL (ref 150–400)
RBC: 4.41 MIL/uL (ref 3.87–5.11)
RDW: 15.9 % — ABNORMAL HIGH (ref 11.5–15.5)
WBC: 8.4 10*3/uL (ref 4.0–10.5)
nRBC: 0 % (ref 0.0–0.2)

## 2020-10-30 LAB — COMPREHENSIVE METABOLIC PANEL
ALT: 21 U/L (ref 0–44)
AST: 23 U/L (ref 15–41)
Albumin: 3.5 g/dL (ref 3.5–5.0)
Alkaline Phosphatase: 66 U/L (ref 38–126)
Anion gap: 11 (ref 5–15)
BUN: 17 mg/dL (ref 8–23)
CO2: 28 mmol/L (ref 22–32)
Calcium: 8.9 mg/dL (ref 8.9–10.3)
Chloride: 95 mmol/L — ABNORMAL LOW (ref 98–111)
Creatinine, Ser: 0.9 mg/dL (ref 0.44–1.00)
GFR, Estimated: >60 mL/min (ref >60)
Glucose, Bld: 124 mg/dL — ABNORMAL HIGH (ref 70–99)
Potassium: 3.1 mmol/L — ABNORMAL LOW (ref 3.5–5.1)
Sodium: 134 mmol/L — ABNORMAL LOW (ref 135–145)
Total Bilirubin: 0.5 mg/dL (ref 0.3–1.2)
Total Protein: 7.9 g/dL (ref 6.5–8.1)

## 2020-10-31 LAB — CANCER ANTIGEN 15-3: CA 15-3: 11.8 U/mL (ref 0.0–25.0)

## 2020-10-31 LAB — CANCER ANTIGEN 27.29: CA 27.29: 11.3 U/mL (ref 0.0–38.6)

## 2020-11-02 ENCOUNTER — Other Ambulatory Visit: Payer: Self-pay

## 2020-11-02 ENCOUNTER — Ambulatory Visit
Admission: RE | Admit: 2020-11-02 | Discharge: 2020-11-02 | Disposition: A | Discharge status: Home / Self Care | Payer: Medicare Other | Source: Ambulatory Visit | Attending: Family Medicine | Admitting: Family Medicine

## 2020-11-02 ENCOUNTER — Ambulatory Visit
Admission: RE | Admit: 2020-11-02 | Discharge: 2020-11-02 | Disposition: A | Discharge status: Home / Self Care | Payer: Medicare Other | Source: Ambulatory Visit | Attending: Oncology | Admitting: Oncology

## 2020-11-02 DIAGNOSIS — C50511 Malignant neoplasm of lower-outer quadrant of right female breast: Secondary | ICD-10-CM

## 2020-11-02 DIAGNOSIS — Z780 Asymptomatic menopausal state: Secondary | ICD-10-CM

## 2020-11-02 DIAGNOSIS — C50919 Malignant neoplasm of unspecified site of unspecified female breast: Secondary | ICD-10-CM

## 2020-11-02 HISTORY — DX: Personal history of irradiation: Z92.3

## 2020-11-05 ENCOUNTER — Encounter: Payer: Self-pay | Admitting: Oncology

## 2020-11-05 ENCOUNTER — Inpatient Hospital Stay: Payer: Medicare Other | Attending: Oncology | Admitting: Oncology

## 2020-11-05 VITALS — BP 155/85 | HR 61 | Temp 98.0°F | Resp 16 | Wt 294.9 lb

## 2020-11-05 DIAGNOSIS — E876 Hypokalemia: Secondary | ICD-10-CM | POA: Insufficient documentation

## 2020-11-05 DIAGNOSIS — C50811 Malignant neoplasm of overlapping sites of right female breast: Secondary | ICD-10-CM | POA: Diagnosis present

## 2020-11-05 DIAGNOSIS — Z171 Estrogen receptor negative status [ER-]: Secondary | ICD-10-CM | POA: Diagnosis not present

## 2020-11-05 DIAGNOSIS — Z95828 Presence of other vascular implants and grafts: Secondary | ICD-10-CM | POA: Diagnosis not present

## 2020-11-05 DIAGNOSIS — C50919 Malignant neoplasm of unspecified site of unspecified female breast: Secondary | ICD-10-CM

## 2020-11-05 MED ORDER — POTASSIUM CHLORIDE CRYS ER 20 MEQ PO TBCR: 20.0000 meq | EXTENDED_RELEASE_TABLET | Freq: Every day | ORAL | 0 refills | Status: DC

## 2020-11-05 NOTE — Progress Notes (Signed)
Hematology/Oncology Follow up note [facility] Telephone:([phone]) [phone] Fax:(336) [phone]   Patient Care Team: Valerie Roys, DO as PCP - General (Family Medicine) Thornton Park, MD as Referring Physician (Orthopedic Surgery) Rico Junker, RN as Oncology Nurse Navigator Byrnett, Forest Gleason, MD (General Surgery) Earlie Server, MD as Medical Oncologist (Medical Oncology) Noreene Filbert, MD as Referring Physician (Radiation Oncology)  REASON FOR VISIT Follow up for treatment of Breast cancer, assess of chemotherapy toxicity.  HISTORY OF PRESENTING ILLNESS:  Wendy Marsh is a  65 y.o.  female with PMH listed below who was referred to me for evaluation of breast cancer  04/28/2019 diagnostic mammogram Mammogram showed right breast 6 o'clock axis breast mass, 2.9 cm corresponding to area of concern.  cT2N0  Breast mass was biopsied, and pathology showed invasive mammary carcinoma.  Grade 3 , ER/PR- HER2 -.  LVI suspicious, favor present.   #  baseline MUGA testing done and reviewed systolic function depression, LVEF 45%. Given that Adriamycin has cardiac toxicity, potentially can lead to irreversible cardiomyopathy, I discussed with patient about using non-anthracycline-containing regimen. # 05/23/2019- 09/11/2018  S/p 6 cycle of Docetaxel $RemoveBefo'75mg'vHKGNjYtWeB$ /m2 and Carboplatin every 3 weeks [ data from [place] et al 2018 Clin Cancer Res, Pathological Response and Survival in Triple-Negative Breast Cancer Following Neoadjuvant Carboplatin plus Docetaxel.Stage I-III TNBC patients, Neoadjuvant carboplatin (AUC6) plus docetaxel (75 mg/m2) every 21 days  6 cycles. pCR was 55% and Residual cancer burden (RCB) were 13%. Excellent Three-year RFS was 90% in patients with pCR and 66% in those without pCR]    # 10/29/2018   right breast lumpectomy with  excisional biopsy of the right axillary deep sentinel lymph nodes. Pathology showed CR  ypT0, ypN0- complete  pathological remission.   # # Family history of breast cancer plus personal history of triple negative breast cancer. Testing did not reveal a pathogenic mutation in any of the genes analyzed.  BRCA negative.  VUS was detected. Does not change management.   #  INTERVAL HISTORY Wendy Marsh is a 65 y.o. female who has above history reviewed by me today presents for discussion of pathology and further management plan for triple negative breast cancer. Patient reports no new complaints.  Chronic joint aches, unchanged.  Denies any breast concerns .Marland Kitchen Review of Systems  Constitutional: Negative for chills, fever, malaise/fatigue and weight loss.  HENT: Negative for nosebleeds and sore throat.   Eyes: Negative for double vision, photophobia and redness.  Respiratory: Negative for cough, shortness of breath and wheezing.   Cardiovascular: Negative for chest pain, palpitations, orthopnea and leg swelling.  Gastrointestinal: Negative for abdominal pain, blood in stool, nausea and vomiting.  Genitourinary: Negative for dysuria.  Musculoskeletal: Positive for joint pain. Negative for back pain, myalgias and neck pain.       Chronic right knee pain.  Skin: Negative for itching and rash.  Neurological: Negative for dizziness, tingling and tremors.  Endo/Heme/Allergies: Negative for environmental allergies. Does not bruise/bleed easily.  Psychiatric/Behavioral: Negative for depression and hallucinations.    MEDICAL HISTORY:  Past Medical History:  Diagnosis Date  . Arthritis   . Atrial fibrillation ([place])   . Breast cancer ([place]) 04/2018   right breast  . Dysrhythmia    afib  . Hyperlipidemia   . Hypertension   . Hypotension due to drugs 05/30/2018  . Hypothyroidism   . Lumbago   . Osteoporosis   . Personal history of chemotherapy   . Personal history  of radiation therapy   . Thyroid disease     SURGICAL HISTORY: Past Surgical History:  Procedure Laterality Date  . BREAST  BIOPSY Right 05/02/2018   Invasive mammary carcinoma, grade 3, neo adj chemo  . BREAST LUMPECTOMY Right 10/29/2018   NO RESIDUAL MALIGNANCY IDENTIFIED.   Marland Kitchen BREAST LUMPECTOMY WITH NEEDLE LOCALIZATION AND AXILLARY SENTINEL LYMPH NODE BX Right 10/29/2018   Procedure: BREAST LUMPECTOMY WITH NEEDLE LOCALIZATION AND SENTINEL LYMPH NODE BX;  Surgeon: Ancil Linsey, MD;  Location: [facility] ORS;  Service: General;  Laterality: Right;  . JOINT REPLACEMENT Left    tkr  . PORTACATH PLACEMENT Right 05/11/2018   Procedure: INSERTION  I.J PORT-A-CATH;  Surgeon: Earline Mayotte, MD;  Location: [facility] ORS;  Service: General;  Laterality: Right;  . REPLACEMENT TOTAL KNEE      SOCIAL HISTORY: Social History   Socioeconomic History  . Marital status: Married    Spouse name: jerry  . Number of children: 3  . Years of education: Not on file  . Highest education level: Some college, no degree  Occupational History  . Not on file  Tobacco Use  . Smoking status: Never Smoker  . Smokeless tobacco: Never Used  Vaping Use  . Vaping Use: Never used  Substance and Sexual Activity  . Alcohol use: No  . Drug use: No  . Sexual activity: Yes    Birth control/protection: Post-menopausal  Other Topics Concern  . Not on file  Social History Narrative  . Not on file   Social Determinants of Health   Financial Resource Strain: Not on file  Food Insecurity: Not on file  Transportation Needs: Not on file  Physical Activity: Not on file  Stress: Not on file  Social Connections: Not on file  Intimate Partner Violence: Not on file    FAMILY HISTORY: Family History  Problem Relation Age of Onset  . Diabetes Mother   . Congestive Heart Failure Mother   . Breast cancer Maternal Grandmother        dx 34s; deceased 75s  . Cancer Maternal Uncle 60       deceased 40s  . Other Father        No information on father or paternal relatives  . Breast cancer Other 32       paternal half-sister; also esophageal ca  at 61; currently 67  . Diabetes Other   . Hypertension Other   . Kidney disease Other   . Thyroid disease Other     ALLERGIES:  is allergic to gabapentin, morphine and related, peanut-containing drug products, and tape.  MEDICATIONS:  Current Outpatient Medications  Medication Sig Dispense Refill  . acetaminophen (TYLENOL) 500 MG tablet Take 500 mg by mouth every 6 (six) hours as needed.    Marland Kitchen apixaban (ELIQUIS) 5 MG TABS tablet Take 5 mg by mouth in the morning and at bedtime.    Marland Kitchen EPINEPHrine 0.3 mg/0.3 mL IJ SOAJ injection Inject 0.3 mg into the muscle as needed for anaphylaxis. 1 each 12  . flecainide (TAMBOCOR) 100 MG tablet Take 100 mg by mouth 2 (two) times daily.    Marland Kitchen levothyroxine (SYNTHROID) 175 MCG tablet Take 1 tablet (175 mcg total) by mouth daily before breakfast. 90 tablet 3  . lidocaine-prilocaine (EMLA) cream APPLY TO AFFECTED AREA ONCE AS DIRECTED 30 g 0  . lisinopril-hydrochlorothiazide (ZESTORETIC) 20-25 MG tablet Take 2 tablets by mouth daily. 180 tablet 1  . metoprolol succinate (TOPROL-XL) 50 MG 24 hr tablet  TAKE 1 TABLET BY MOUTH ONCE DAILY WITH  OR  IMMEDIATELY  FOLLOWING  A  MEAL 90 tablet 1  . potassium chloride SA (K-DUR,KLOR-CON) 20 MEQ tablet Take 1 tablet (20 mEq total) by mouth daily. (Patient not taking: No sig reported) 90 tablet 1   No current facility-administered medications for this visit.     PHYSICAL EXAMINATION: ECOG PERFORMANCE STATUS: 1 - Symptomatic but completely ambulatory Vitals:   11/05/20 1033  BP: (!) 155/85  Pulse: 61  Resp: 16  Temp: 98 F (36.7 C)   Filed Weights   11/05/20 1033  Weight: 294 lb 14.4 oz (133.8 kg)    Physical Exam Constitutional:      General: She is not in acute distress.    Appearance: She is obese.     Comments: Obese, walks with a walker  HENT:     Head: Normocephalic and atraumatic.  Eyes:     General: No scleral icterus.    Pupils: Pupils are equal, round, and reactive to light.   Cardiovascular:     Rate and Rhythm: Normal rate and regular rhythm.     Heart sounds: Normal heart sounds.  Pulmonary:     Effort: Pulmonary effort is normal. No respiratory distress.     Breath sounds: No wheezing.  Abdominal:     General: Bowel sounds are normal. There is no distension.     Palpations: Abdomen is soft. There is no mass.     Tenderness: There is no abdominal tenderness.  Musculoskeletal:        General: No deformity. Normal range of motion.     Cervical back: Normal range of motion and neck supple.     Comments: Chronic lower extremity edema.  1+  Skin:    General: Skin is warm and dry.     Findings: No erythema or rash.  Neurological:     Mental Status: She is alert and oriented to person, place, and time. Mental status is at baseline.     Cranial Nerves: No cranial nerve deficit.     Coordination: Coordination normal.  Psychiatric:        Mood and Affect: Mood normal.       LABORATORY DATA:  I have reviewed the data as listed Lab Results  Component Value Date   WBC 8.4 10/30/2020   HGB 12.3 10/30/2020   HCT 38.0 10/30/2020   MCV 86.2 10/30/2020   PLT 273 10/30/2020   Recent Labs    12/09/19 1117 05/08/20 1244 06/16/20 1047 10/30/20 1320  NA 137 138 141 134*  K 3.2* 3.8 3.6 3.1*  CL 97* 98 99 95*  CO2 $Re'28 31 26 28  'sRW$ GLUCOSE 108* 103* 90 124*  BUN $Re'16 20 18 17  'UZs$ CREATININE 0.88 1.00 0.84 0.90  CALCIUM 8.8* 8.9 9.1 8.9  GFRNONAA >60 59* 73 >60  GFRAA >60 >60 84  --   PROT 7.8 7.5 6.7 7.9  ALBUMIN 3.7 3.7 3.9 3.5  AST $Re'21 18 14 23  'CeQ$ ALT $R'16 16 16 21  'ZZ$ ALKPHOS 72 68 80 66  BILITOT 0.4 0.6 0.3 0.5   RADIOGRAPHIC STUDIES: I have personally reviewed the radiological images as listed and agreed with the findings in the report. NM Cardiac Muga rest: Calculated LEFT ventricular ejection fraction equals 45%   ASSESSMENT & PLAN:  1. 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[place])   2. Port-A-Cath in place   3. Hypokalemia   Cancer  Staging Malignant neoplasm of lower-outer quadrant of  right female breast [facility]) Staging form: Breast, AJCC 8th Edition - Clinical stage from 05/08/2018: Stage IIB (cT2, cN0, cM0, G3, ER-, PR-, HER2-) - Signed by Earlie Server, MD on 05/08/2018 - Pathologic stage from 11/14/2018: No Stage Recommended (ypT0, pN0, cM0, ER-, PR-, HER2-) - Signed by Earlie Server, MD on 11/14/2018   #Triple negative breast cancer,  Status post neoadjuvant chemotherapy with 6 cycles of Doxetaxol and carboplatin. .  Status post right lumpectomy with excisional biopsy of sentinel lymph node. S/p radiation.   Complete pathological remission,  ypT0 ypN0 Patient is clinically doing well. 11/02/2020, bilateral diagnostic mammogram with ultrasound showed stable appearance of the probably benign right breast calcifications at the lumpectomy scar.  Consistent with benign fat necrosis.  Stable appearance of probably benign left breast mass in the 2:00 location. Recommend a repeat diagnostic mammogram in 6 months.   #Chronic hypo-kalemia, potassium is 3.1.  Prescription of potassium chloride 20 mEq daily for 10 days sent to pharmacy. Recommend patient to call primary care provider and repeat potassium level in 4 weeks. #Port-A-Cath in place, continue port flush.  Every 6-8 weeks   Orders Placed This Encounter  Procedures  . MM DIAG BREAST TOMO BILATERAL    Standing Status:   Future    Standing Expiration Date:   11/05/2021    Order Specific Question:   Reason for Exam (SYMPTOM  OR DIAGNOSIS REQUIRED)    Answer:   history of triple negative breast cancer    Order Specific Question:   Preferred imaging location?    Answer:   [facility]  . US Breast Limited Uni Left Inc Axilla    Standing Status:   Future    Standing Expiration Date:   11/05/2021    Order Specific Question:   Reason for Exam (SYMPTOM  OR DIAGNOSIS REQUIRED)    Answer:   history of triple negative breast cancer    Order Specific Question:   Preferred imaging  location?    Answer:   [facility]  . US Breast Limited Uni Right Inc Axilla    Standing Status:   Future    Standing Expiration Date:   11/05/2021    Order Specific Question:   Reason for Exam (SYMPTOM  OR DIAGNOSIS REQUIRED)    Answer:   history of triple negative breast cancer    Order Specific Question:   Preferred imaging location?    Answer:   [facility]    Return of visit: 6 months with repeat CBC and CMP, [place], [place] [address],  Earlie Server, MD, PhD 11/05/2020

## 2020-11-05 NOTE — Progress Notes (Signed)
Patient denies new problems/concerns today.   °

## 2020-11-17 ENCOUNTER — Inpatient Hospital Stay: Payer: Medicare Other

## 2020-11-17 DIAGNOSIS — Z95828 Presence of other vascular implants and grafts: Secondary | ICD-10-CM

## 2020-11-17 DIAGNOSIS — C50811 Malignant neoplasm of overlapping sites of right female breast: Secondary | ICD-10-CM | POA: Diagnosis not present

## 2020-11-17 MED ORDER — SODIUM CHLORIDE 0.9% FLUSH
10.0000 mL | Freq: Once | INTRAVENOUS | Status: AC
  Administered 2020-11-17: 10 mL
  Filled 2020-11-17: qty 10

## 2020-11-17 MED ORDER — HEPARIN SOD (PORK) LOCK FLUSH 100 UNIT/ML IV SOLN
INTRAVENOUS | Status: AC
  Filled 2020-11-17: qty 5

## 2020-11-17 MED ORDER — HEPARIN SOD (PORK) LOCK FLUSH 100 UNIT/ML IV SOLN
500.0000 [IU] | Freq: Once | INTRAVENOUS | Status: AC
  Administered 2020-11-17: 500 [IU]
  Filled 2020-11-17: qty 5

## 2020-11-24 ENCOUNTER — Telehealth (INDEPENDENT_AMBULATORY_CARE_PROVIDER_SITE_OTHER): Payer: Medicare Other | Admitting: Nurse Practitioner

## 2020-11-24 ENCOUNTER — Encounter: Payer: Self-pay | Admitting: Nurse Practitioner

## 2020-11-24 DIAGNOSIS — M79674 Pain in right toe(s): Secondary | ICD-10-CM | POA: Diagnosis not present

## 2020-11-24 MED ORDER — PREDNISONE 10 MG PO TABS: ORAL_TABLET | ORAL | 0 refills | Status: DC

## 2020-11-24 NOTE — Progress Notes (Signed)
LMP 03/17/2009 (Approximate)    Subjective:    Patient ID: Wendy Marsh, female    DOB: July 26, 1956, 65 y.o.   MRN: [id number]  HPI: Wendy Marsh is a 65 y.o. female  Chief Complaint  Patient presents with  . Gout    Patient stated with her toe hurting last week. Pain shot across the top of her foot and some swelling and states she is using a walker to get around. Pt states she has had gout before but this episode is the worse.     . This visit was completed via telephone due to the restrictions of the COVID-19 pandemic. All issues as above were discussed and addressed but no physical exam was performed. If it was felt that the patient should be evaluated in the office, they were directed there. The patient verbally consented to this visit. Patient was unable to complete an audio/visual visit due to Technical difficulties, Lack of internet. Due to the catastrophic nature of the COVID-19 pandemic, this visit was done through audio contact only. . Location of the patient: home . Location of the provider: work . Those involved with this call:  . Provider: Marnee Guarneri, DNP . CMA: Irena Reichmann, CMA . Front Desk/Registration: Jill Side  . Time spent on call: 21 minutes on the phone discussing health concerns. 15 minutes total spent in review of patient's record and preparation of their chart.  . I verified patient identity using two factors (patient name and date of birth). Patient consents verbally to being seen via telemedicine visit today.    GOUT On Thursday last week her right great toe started hurting and now has gone across to other toes, there is swelling/redness/heat present.  She has had gout 3-4 times previously, but never this bad.  Last in November -- in past has never used anything for treatment, they have gone away on their own.  Last time it took over a week to heal.   Duration:days Right 1st metatarsophalangeal pain: yes Left 1st metatarsophalangeal  pain: no Right knee pain: no Left knee pain: no Severity: 10/10  Quality: dull, aching and throbbing Swelling: yes Redness: yes Trauma: no Recent dietary change or indiscretion: yes too many hamburgers Fevers: no Nausea/vomiting: yes, some nausea Aggravating factors: walking on it Alleviating factors: nothing Status:  stable Treatments attempted: nothing  Relevant past medical, surgical, family and social history reviewed and updated as indicated. Interim medical history since our last visit reviewed. Allergies and medications reviewed and updated.  Review of Systems  Constitutional: Negative for activity change, appetite change, diaphoresis, fatigue and fever.  Respiratory: Negative for cough, chest tightness and shortness of breath.   Cardiovascular: Negative for chest pain, palpitations and leg swelling.  Musculoskeletal: Positive for arthralgias and joint swelling.  Neurological: Negative.   Psychiatric/Behavioral: Negative.     Per HPI unless specifically indicated above     Objective:    LMP 03/17/2009 (Approximate)   Wt Readings from Last 3 Encounters:  11/05/20 294 lb 14.4 oz (133.8 kg)  08/18/20 (!) 302 lb (137 kg)  06/16/20 (!) 311 lb (141.1 kg)    Physical Exam   Unable to perform due to telephone visit only  Results for orders placed or performed in visit on 10/30/20  Cancer antigen 27.29  Result Value Ref Range   CA 27.29 11.3 0.0 - 38.6 U/mL  Cancer antigen 15-3  Result Value Ref Range   CA 15-3 11.8 0.0 - 25.0 U/mL  Comprehensive metabolic panel  Result Value Ref Range   Sodium 134 (L) 135 - 145 mmol/L   Potassium 3.1 (L) 3.5 - 5.1 mmol/L   Chloride 95 (L) 98 - 111 mmol/L   CO2 28 22 - 32 mmol/L   Glucose, Bld 124 (H) 70 - 99 mg/dL   BUN 17 8 - 23 mg/dL   Creatinine, Ser 0.90 0.44 - 1.00 mg/dL   Calcium 8.9 8.9 - 10.3 mg/dL   Total Protein 7.9 6.5 - 8.1 g/dL   Albumin 3.5 3.5 - 5.0 g/dL   AST 23 15 - 41 U/L   ALT 21 0 - 44 U/L   Alkaline  Phosphatase 66 38 - 126 U/L   Total Bilirubin 0.5 0.3 - 1.2 mg/dL   GFR, Estimated >60 >60 mL/min   Anion gap 11 5 - 15  CBC with Differential/Platelet  Result Value Ref Range   WBC 8.4 4.0 - 10.5 K/uL   RBC 4.41 3.87 - 5.11 MIL/uL   Hemoglobin 12.3 12.0 - 15.0 g/dL   HCT 38.0 36.0 - 46.0 %   MCV 86.2 80.0 - 100.0 fL   MCH 27.9 26.0 - 34.0 pg   MCHC 32.4 30.0 - 36.0 g/dL   RDW 15.9 (H) 11.5 - 15.5 %   Platelets 273 150 - 400 K/uL   nRBC 0.0 0.0 - 0.2 %   Neutrophils Relative % 71 %   Neutro Abs 6.0 1.7 - 7.7 K/uL   Lymphocytes Relative 18 %   Lymphs Abs 1.5 0.7 - 4.0 K/uL   Monocytes Relative 8 %   Monocytes Absolute 0.6 0.1 - 1.0 K/uL   Eosinophils Relative 3 %   Eosinophils Absolute 0.2 0.0 - 0.5 K/uL   Basophils Relative 0 %   Basophils Absolute 0.0 0.0 - 0.1 K/uL   Immature Granulocytes 0 %   Abs Immature Granulocytes 0.03 0.00 - 0.07 K/uL      Assessment & Plan:   Problem List Items Addressed This Visit      Other   Great toe pain, right    Suspect acute gout flare, as has had similar in past that she has treated on own.  No recent uric acid level on chart.  At this time will trial Prednisone taper, due to HTN will avoid NSAID at this time.  May need to consider discontinuation of HCTZ in future if ongoing flares.  Plan to have return in one week to see this provider or PCP for follow-up and labs in office.  May benefit from Allopurinol if elevated uric acid and ongoing flares.  Educated on diet changes needed.         I discussed the assessment and treatment plan with the patient. The patient was provided an opportunity to ask questions and all were answered. The patient agreed with the plan and demonstrated an understanding of the instructions.   The patient was advised to call back or seek an in-person evaluation if the symptoms worsen or if the condition fails to improve as anticipated.   I provided 21+ minutes of time during this encounter.  Follow up  plan: Return in about 1 week (around 12/01/2020) for Gout pain.

## 2020-11-24 NOTE — Assessment & Plan Note (Signed)
Suspect acute gout flare, as has had similar in past that she has treated on own.  No recent uric acid level on chart.  At this time will trial Prednisone taper, due to HTN will avoid NSAID at this time.  May need to consider discontinuation of HCTZ in future if ongoing flares.  Plan to have return in one week to see this provider or PCP for follow-up and labs in office.  May benefit from Allopurinol if elevated uric acid and ongoing flares.  Educated on diet changes needed.

## 2020-11-24 NOTE — Patient Instructions (Signed)
Gout  Gout is painful swelling of your joints. Gout is a type of arthritis. It is caused by having too much uric acid in your body. Uric acid is a chemical that is made when your body breaks down substances called purines. If your body has too much uric acid, sharp crystals can form and build up in your joints. This causes pain and swelling. Gout attacks can happen quickly and be very painful (acute gout). Over time, the attacks can affect more joints and happen more often (chronic gout). What are the causes?  Too much uric acid in your blood. This can happen because: ? Your kidneys do not remove enough uric acid from your blood. ? Your body makes too much uric acid. ? You eat too many foods that are high in purines. These foods include organ meats, some seafood, and beer.  Trauma or stress. What increases the risk?  Having a family history of gout.  Being female and middle-aged.  Being female and having gone through menopause.  Being very overweight (obese).  Drinking alcohol, especially beer.  Not having enough water in the body (being dehydrated).  Losing weight too quickly.  Having an organ transplant.  Having lead poisoning.  Taking certain medicines.  Having kidney disease.  Having a skin condition called psoriasis. What are the signs or symptoms? An attack of acute gout usually happens in just one joint. The most common place is the big toe. Attacks often start at night. Other joints that may be affected include joints of the feet, ankle, knee, fingers, wrist, or elbow. Symptoms of an attack may include:  Very bad pain.  Warmth.  Swelling.  Stiffness.  Shiny, red, or purple skin.  Tenderness. The affected joint may be very painful to touch.  Chills and fever. Chronic gout may cause symptoms more often. More joints may be involved. You may also have white or yellow lumps (tophi) on your hands or feet or in other areas near your joints.   How is this  treated?  Treatment for this condition has two phases: treating an acute attack and preventing future attacks.  Acute gout treatment may include: ? NSAIDs. ? Steroids. These are taken by mouth or injected into a joint. ? Colchicine. This medicine relieves pain and swelling. It can be given by mouth or through an IV tube.  Preventive treatment may include: ? Taking small doses of NSAIDs or colchicine daily. ? Using a medicine that reduces uric acid levels in your blood. ? Making changes to your diet. You may need to see a food expert (dietitian) about what to eat and drink to prevent gout. Follow these instructions at home: During a gout attack  If told, put ice on the painful area: ? Put ice in a plastic bag. ? Place a towel between your skin and the bag. ? Leave the ice on for 20 minutes, 2-3 times a day.  Raise (elevate) the painful joint above the level of your heart as often as you can.  Rest the joint as much as possible. If the joint is in your leg, you may be given crutches.  Follow instructions from your doctor about what you cannot eat or drink.   Avoiding future gout attacks  Eat a low-purine diet. Avoid foods and drinks such as: ? Liver. ? Kidney. ? Anchovies. ? Asparagus. ? Herring. ? Mushrooms. ? Mussels. ? Beer.  Stay at a healthy weight. If you want to lose weight, talk with your doctor. Do   not lose weight too fast.  Start or continue an exercise plan as told by your doctor. Eating and drinking  Drink enough fluids to keep your pee (urine) pale yellow.  If you drink alcohol: ? Limit how much you use to:  0-1 drink a day for women.  0-2 drinks a day for men. ? Be aware of how much alcohol is in your drink. In the [place] one drink equals one 12 oz bottle of beer (355 mL), one 5 oz glass of wine (148 mL), or one 1 oz glass of hard liquor (44 mL). General instructions  Take over-the-counter and prescription medicines only as told by your doctor.  Do  not drive or use heavy machinery while taking prescription pain medicine.  Return to your normal activities as told by your doctor. Ask your doctor what activities are safe for you.  Keep all follow-up visits as told by your doctor. This is important. Contact a doctor if:  You have another gout attack.  You still have symptoms of a gout attack after 10 days of treatment.  You have problems (side effects) because of your medicines.  You have chills or a fever.  You have burning pain when you pee (urinate).  You have pain in your lower back or belly. Get help right away if:  You have very bad pain.  Your pain cannot be controlled.  You cannot pee. Summary  Gout is painful swelling of the joints.  The most common site of pain is the big toe, but it can affect other joints.  Medicines and avoiding some foods can help to prevent and treat gout attacks. This information is not intended to replace advice given to you by your health care provider. Make sure you discuss any questions you have with your health care provider. Document Revised: 04/11/2018 Document Reviewed: 04/11/2018 Elsevier Patient Education  2021 [employer] Inc.  

## 2020-12-01 ENCOUNTER — Other Ambulatory Visit: Payer: Self-pay

## 2020-12-01 ENCOUNTER — Encounter: Payer: Self-pay | Admitting: Family Medicine

## 2020-12-01 ENCOUNTER — Ambulatory Visit (INDEPENDENT_AMBULATORY_CARE_PROVIDER_SITE_OTHER): Payer: Medicare Other | Admitting: Family Medicine

## 2020-12-01 VITALS — BP 135/79 | HR 54 | Temp 98.3°F | Wt 294.0 lb

## 2020-12-01 DIAGNOSIS — E876 Hypokalemia: Secondary | ICD-10-CM

## 2020-12-01 DIAGNOSIS — M79674 Pain in right toe(s): Secondary | ICD-10-CM | POA: Diagnosis not present

## 2020-12-01 NOTE — Progress Notes (Signed)
BP 135/79   Pulse (!) 54   Temp 98.3 F (36.8 C)   Wt 294 lb (133.4 kg)   LMP 03/17/2009 (Approximate)   SpO2 96%   BMI 53.77 kg/m    Subjective:    Patient ID: Wendy Marsh, female    DOB: 04-10-1956, 65 y.o.   MRN: [id number]  HPI: SONNY POTH is a 65 y.o. female  Chief Complaint  Patient presents with  . Gout    Follow up, patient states pain in toe is much better   . hypokalemia    Patient states her oncologist put her on potassium because it was low, patient took rx and is done. Oncologist suggest potassium level recheck    Had her first gout flare in years last week. It was awful. She had quite a bit of swelling and redness and pain in her R great toe. She was treated with prednisone and that seems like it has really helped. She is feeling back to normal now, but would like to avoid this happening again. Her potassium has been low when she was at the oncologist. She just finished a course of potassium. No other concerns or complaints at this time.   Relevant past medical, surgical, family and social history reviewed and updated as indicated. Interim medical history since our last visit reviewed. Allergies and medications reviewed and updated.  Review of Systems  Constitutional: Negative.   Respiratory: Negative.   Cardiovascular: Negative.   Gastrointestinal: Negative.   Musculoskeletal: Negative.   Neurological: Negative.   Psychiatric/Behavioral: Negative.     Per HPI unless specifically indicated above     Objective:    BP 135/79   Pulse (!) 54   Temp 98.3 F (36.8 C)   Wt 294 lb (133.4 kg)   LMP 03/17/2009 (Approximate)   SpO2 96%   BMI 53.77 kg/m   Wt Readings from Last 3 Encounters:  12/01/20 294 lb (133.4 kg)  11/05/20 294 lb 14.4 oz (133.8 kg)  08/18/20 (!) 302 lb (137 kg)    Physical Exam Vitals and nursing note reviewed.  Constitutional:      General: She is not in acute distress.    Appearance: Normal appearance. She is not  ill-appearing, toxic-appearing or diaphoretic.  HENT:     Head: Normocephalic and atraumatic.     Right Ear: External ear normal.     Left Ear: External ear normal.     Nose: Nose normal.     Mouth/Throat:     Mouth: Mucous membranes are moist.     Pharynx: Oropharynx is clear.  Eyes:     General: No scleral icterus.       Right eye: No discharge.        Left eye: No discharge.     Extraocular Movements: Extraocular movements intact.     Conjunctiva/sclera: Conjunctivae normal.     Pupils: Pupils are equal, round, and reactive to light.  Cardiovascular:     Rate and Rhythm: Normal rate and regular rhythm.     Pulses: Normal pulses.     Heart sounds: Normal heart sounds. No murmur heard. No friction rub. No gallop.   Pulmonary:     Effort: Pulmonary effort is normal. No respiratory distress.     Breath sounds: Normal breath sounds. No stridor. No wheezing, rhonchi or rales.  Chest:     Chest wall: No tenderness.  Musculoskeletal:        General: Normal range of motion.  Cervical back: Normal range of motion and neck supple.  Skin:    General: Skin is warm and dry.     Capillary Refill: Capillary refill takes less than 2 seconds.     Coloration: Skin is not jaundiced or pale.     Findings: No bruising, erythema, lesion or rash.  Neurological:     General: No focal deficit present.     Mental Status: She is alert and oriented to person, place, and time. Mental status is at baseline.  Psychiatric:        Mood and Affect: Mood normal.        Behavior: Behavior normal.        Thought Content: Thought content normal.        Judgment: Judgment normal.     Results for orders placed or performed in visit on 12/01/20  Uric acid  Result Value Ref Range   Uric Acid 9.7 (H) 3.0 - 7.2 mg/dL  Basic metabolic panel  Result Value Ref Range   Glucose 105 (H) 65 - 99 mg/dL   BUN 24 8 - 27 mg/dL   Creatinine, Ser 0.99 0.57 - 1.00 mg/dL   eGFR 63 >59 mL/min/1.73   BUN/Creatinine  Ratio 24 12 - 28   Sodium 140 134 - 144 mmol/L   Potassium 3.6 3.5 - 5.2 mmol/L   Chloride 96 96 - 106 mmol/L   CO2 24 20 - 29 mmol/L   Calcium 9.5 8.7 - 10.3 mg/dL      Assessment & Plan:   Problem List Items Addressed This Visit      Other   Hypokalemia    Rechecking labs today. Await results. Treat as needed.       Relevant Orders   Basic metabolic panel (Completed)   Great toe pain, right - Primary    Likely gout. Will check uric acid. Much better with the prednisone. If uric acid elevated, will likely need allopurinol. Call with any concerns. Continue to monitor.       Relevant Orders   Uric acid (Completed)       Follow up plan: Return in about 4 weeks (around 12/29/2020) for physical.

## 2020-12-02 LAB — BASIC METABOLIC PANEL
BUN/Creatinine Ratio: 24 (ref 12–28)
BUN: 24 mg/dL (ref 8–27)
CO2: 24 mmol/L (ref 20–29)
Calcium: 9.5 mg/dL (ref 8.7–10.3)
Chloride: 96 mmol/L (ref 96–106)
Creatinine, Ser: 0.99 mg/dL (ref 0.57–1.00)
Glucose: 105 mg/dL — ABNORMAL HIGH (ref 65–99)
Potassium: 3.6 mmol/L (ref 3.5–5.2)
Sodium: 140 mmol/L (ref 134–144)
eGFR: 63 mL/min/{1.73_m2} (ref >59)

## 2020-12-02 LAB — URIC ACID: Uric Acid: 9.7 mg/dL — ABNORMAL HIGH (ref 3.0–7.2)

## 2020-12-04 ENCOUNTER — Encounter: Payer: Self-pay | Admitting: Family Medicine

## 2020-12-04 NOTE — Assessment & Plan Note (Signed)
Rechecking labs today. Await results. Treat as needed.  °

## 2020-12-04 NOTE — Assessment & Plan Note (Signed)
Likely gout. Will check uric acid. Much better with the prednisone. If uric acid elevated, will likely need allopurinol. Call with any concerns. Continue to monitor.

## 2020-12-05 ENCOUNTER — Other Ambulatory Visit: Payer: Self-pay | Admitting: Family Medicine

## 2020-12-05 DIAGNOSIS — M109 Gout, unspecified: Secondary | ICD-10-CM

## 2020-12-05 MED ORDER — COLCHICINE 0.6 MG PO TABS: 0.6000 mg | ORAL_TABLET | Freq: Every day | ORAL | 0 refills | Status: DC

## 2020-12-05 MED ORDER — ALLOPURINOL 100 MG PO TABS: 100.0000 mg | ORAL_TABLET | Freq: Every day | ORAL | 2 refills | Status: DC

## 2020-12-22 ENCOUNTER — Other Ambulatory Visit: Payer: Self-pay | Admitting: Family Medicine

## 2020-12-22 DIAGNOSIS — I10 Essential (primary) hypertension: Secondary | ICD-10-CM

## 2020-12-22 NOTE — Telephone Encounter (Signed)
Requested Prescriptions  Pending Prescriptions Disp Refills  . lisinopril-hydrochlorothiazide (ZESTORETIC) 20-25 MG tablet [Pharmacy Med Name: Lisinopril-hydroCHLOROthiazide 20-25 MG Oral Tablet] 180 tablet 0    Sig: Take 2 tablets by mouth once daily     Cardiovascular:  ACEI + Diuretic Combos Passed - 12/22/2020 12:18 PM      Passed - Na in normal range and within 180 days    Sodium  Date Value Ref Range Status  12/01/2020 140 134 - 144 mmol/L Final         Passed - K in normal range and within 180 days    Potassium  Date Value Ref Range Status  12/01/2020 3.6 3.5 - 5.2 mmol/L Final         Passed - Cr in normal range and within 180 days    Creatinine, Ser  Date Value Ref Range Status  12/01/2020 0.99 0.57 - 1.00 mg/dL Final         Passed - Ca in normal range and within 180 days    Calcium  Date Value Ref Range Status  12/01/2020 9.5 8.7 - 10.3 mg/dL Final         Passed - Patient is not pregnant      Passed - Last BP in normal range    BP Readings from Last 1 Encounters:  12/01/20 135/79         Passed - Valid encounter within last 6 months    Recent Outpatient Visits          3 weeks ago Great toe pain, right   [facility], [place], DO   4 weeks ago [place] toe pain, right   [facility], [place], NP   5 months ago Anemia, unspecified type   [facility], [place], DO   6 months ago [facility] [employer] [facility] preventive visit   [facility], [place], DO   1 year ago Essential hypertension   [facility], [place], DO      Future Appointments            In 2 months Wynetta Emery, Barb Merino, DO [facility], [facility]

## 2021-01-05 ENCOUNTER — Other Ambulatory Visit: Payer: Medicare Other

## 2021-01-05 ENCOUNTER — Other Ambulatory Visit: Payer: Self-pay

## 2021-01-05 DIAGNOSIS — M109 Gout, unspecified: Secondary | ICD-10-CM

## 2021-01-06 LAB — BASIC METABOLIC PANEL
BUN/Creatinine Ratio: 19 (ref 12–28)
BUN: 16 mg/dL (ref 8–27)
CO2: 26 mmol/L (ref 20–29)
Calcium: 9.6 mg/dL (ref 8.7–10.3)
Chloride: 98 mmol/L (ref 96–106)
Creatinine, Ser: 0.85 mg/dL (ref 0.57–1.00)
Glucose: 92 mg/dL (ref 65–99)
Potassium: 3.8 mmol/L (ref 3.5–5.2)
Sodium: 142 mmol/L (ref 134–144)
eGFR: 76 mL/min/{1.73_m2} (ref >59)

## 2021-01-06 LAB — URIC ACID: Uric Acid: 9.3 mg/dL — ABNORMAL HIGH (ref 3.0–7.2)

## 2021-01-13 ENCOUNTER — Other Ambulatory Visit: Payer: Self-pay | Admitting: Family Medicine

## 2021-01-13 DIAGNOSIS — M109 Gout, unspecified: Secondary | ICD-10-CM

## 2021-01-13 MED ORDER — ALLOPURINOL 300 MG PO TABS: 300.0000 mg | ORAL_TABLET | Freq: Every day | ORAL | 1 refills | Status: DC

## 2021-02-04 ENCOUNTER — Inpatient Hospital Stay: Payer: Medicare Other | Attending: Oncology

## 2021-02-04 DIAGNOSIS — Z452 Encounter for adjustment and management of vascular access device: Secondary | ICD-10-CM | POA: Diagnosis present

## 2021-02-04 DIAGNOSIS — Z171 Estrogen receptor negative status [ER-]: Secondary | ICD-10-CM | POA: Insufficient documentation

## 2021-02-04 DIAGNOSIS — Z95828 Presence of other vascular implants and grafts: Secondary | ICD-10-CM

## 2021-02-04 DIAGNOSIS — C50511 Malignant neoplasm of lower-outer quadrant of right female breast: Secondary | ICD-10-CM | POA: Diagnosis not present

## 2021-02-04 MED ORDER — SODIUM CHLORIDE 0.9% FLUSH
10.0000 mL | INTRAVENOUS | Status: DC | PRN
  Administered 2021-02-04: 10 mL via INTRAVENOUS
  Filled 2021-02-04: qty 10

## 2021-02-04 MED ORDER — HEPARIN SOD (PORK) LOCK FLUSH 100 UNIT/ML IV SOLN
500.0000 [IU] | Freq: Once | INTRAVENOUS | Status: AC
  Administered 2021-02-04: 500 [IU] via INTRAVENOUS
  Filled 2021-02-04: qty 5

## 2021-02-04 MED ORDER — HEPARIN SOD (PORK) LOCK FLUSH 100 UNIT/ML IV SOLN
INTRAVENOUS | Status: AC
  Filled 2021-02-04: qty 5

## 2021-02-16 ENCOUNTER — Other Ambulatory Visit: Payer: Self-pay

## 2021-02-16 ENCOUNTER — Other Ambulatory Visit: Payer: Medicare Other

## 2021-02-16 DIAGNOSIS — M109 Gout, unspecified: Secondary | ICD-10-CM

## 2021-02-17 LAB — COMPREHENSIVE METABOLIC PANEL
ALT: 21 IU/L (ref 0–32)
AST: 12 IU/L (ref 0–40)
Albumin/Globulin Ratio: 1.4 (ref 1.2–2.2)
Albumin: 4 g/dL (ref 3.8–4.8)
Alkaline Phosphatase: 95 IU/L (ref 44–121)
BUN/Creatinine Ratio: 21 (ref 12–28)
BUN: 18 mg/dL (ref 8–27)
Bilirubin Total: 0.2 mg/dL (ref 0.0–1.2)
CO2: 24 mmol/L (ref 20–29)
Calcium: 9.4 mg/dL (ref 8.7–10.3)
Chloride: 95 mmol/L — ABNORMAL LOW (ref 96–106)
Creatinine, Ser: 0.85 mg/dL (ref 0.57–1.00)
Globulin, Total: 2.9 g/dL (ref 1.5–4.5)
Glucose: 87 mg/dL (ref 65–99)
Potassium: 4.2 mmol/L (ref 3.5–5.2)
Sodium: 141 mmol/L (ref 134–144)
Total Protein: 6.9 g/dL (ref 6.0–8.5)
eGFR: 76 mL/min/{1.73_m2} (ref >59)

## 2021-02-17 LAB — URIC ACID: Uric Acid: 5.5 mg/dL (ref 3.0–7.2)

## 2021-02-19 ENCOUNTER — Encounter: Payer: Self-pay | Admitting: Family Medicine

## 2021-03-03 ENCOUNTER — Encounter: Payer: Self-pay | Admitting: Family Medicine

## 2021-03-03 ENCOUNTER — Ambulatory Visit (INDEPENDENT_AMBULATORY_CARE_PROVIDER_SITE_OTHER): Payer: Medicare Other | Admitting: Family Medicine

## 2021-03-03 ENCOUNTER — Other Ambulatory Visit: Payer: Self-pay

## 2021-03-03 VITALS — BP 129/77 | HR 71 | Temp 98.2°F | Ht 62.0 in | Wt 297.2 lb

## 2021-03-03 DIAGNOSIS — C50919 Malignant neoplasm of unspecified site of unspecified female breast: Secondary | ICD-10-CM

## 2021-03-03 DIAGNOSIS — E782 Mixed hyperlipidemia: Secondary | ICD-10-CM

## 2021-03-03 DIAGNOSIS — I10 Essential (primary) hypertension: Secondary | ICD-10-CM | POA: Diagnosis not present

## 2021-03-03 DIAGNOSIS — E039 Hypothyroidism, unspecified: Secondary | ICD-10-CM | POA: Diagnosis not present

## 2021-03-03 DIAGNOSIS — Z171 Estrogen receptor negative status [ER-]: Secondary | ICD-10-CM

## 2021-03-03 DIAGNOSIS — M109 Gout, unspecified: Secondary | ICD-10-CM

## 2021-03-03 DIAGNOSIS — C50511 Malignant neoplasm of lower-outer quadrant of right female breast: Secondary | ICD-10-CM

## 2021-03-03 MED ORDER — ALLOPURINOL 300 MG PO TABS: 300.0000 mg | ORAL_TABLET | Freq: Every day | ORAL | 1 refills | Status: DC

## 2021-03-03 MED ORDER — LISINOPRIL-HYDROCHLOROTHIAZIDE 20-25 MG PO TABS: 2.0000 | ORAL_TABLET | Freq: Every day | ORAL | 1 refills | Status: DC

## 2021-03-03 MED ORDER — METOPROLOL SUCCINATE ER 50 MG PO TB24: ORAL_TABLET | ORAL | 1 refills | Status: DC

## 2021-03-03 NOTE — Progress Notes (Signed)
BP 129/77   Pulse 71   Temp 98.2 F (36.8 C)   Ht 5' 2" (1.575 m)   Wt 297 lb 3.2 oz (134.8 kg)   LMP 03/17/2009 (Approximate)   SpO2 96%   BMI 54.36 kg/m    Subjective:    Patient ID: Wendy Marsh, female    DOB: [birth year], 65 y.o.   MRN: [id number]  HPI: Wendy Marsh is a 65 y.o. female  Chief Complaint  Patient presents with  . Hypertension  . Hyperlipidemia  . Hypothyroidism   HYPERTENSION / HYPERLIPIDEMIA Satisfied with current treatment? yes Duration of hypertension: chronic BP monitoring frequency: not checking BP medication side effects: no Past BP meds: lisinopril, metoprolol, HCTZ Duration of hyperlipidemia: chronic Cholesterol medication side effects: no Cholesterol supplements: none Past cholesterol medications: none Medication compliance: excellent compliance Aspirin: no Recent stressors: no Recurrent headaches: no Visual changes: no Palpitations: no Dyspnea: no Chest pain: no Lower extremity edema: no Dizzy/lightheaded: no  HYPOTHYROIDISM Thyroid control status:controlled Satisfied with current treatment? yes Medication side effects: no Medication compliance: excellent compliance Etiology of hypothyroidism:  Recent dose adjustment:no Fatigue: no Cold intolerance: no Heat intolerance: no Weight gain: no Weight loss: no Constipation: no Diarrhea/loose stools: no Palpitations: no Lower extremity edema: no Anxiety/depressed mood: no  No gout flares. Tolerating her allopurinol well. No other concerns.   Relevant past medical, surgical, family and social history reviewed and updated as indicated. Interim medical history since our last visit reviewed. Allergies and medications reviewed and updated.  Review of Systems  Constitutional: Negative.   Respiratory: Negative.   Cardiovascular: Negative.   Gastrointestinal: Negative.   Musculoskeletal: Negative.   Neurological: Negative.   Psychiatric/Behavioral: Negative.      Per HPI unless specifically indicated above     Objective:    BP 129/77   Pulse 71   Temp 98.2 F (36.8 C)   Ht 5' 2" (1.575 m)   Wt 297 lb 3.2 oz (134.8 kg)   LMP 03/17/2009 (Approximate)   SpO2 96%   BMI 54.36 kg/m   Wt Readings from Last 3 Encounters:  03/03/21 297 lb 3.2 oz (134.8 kg)  12/01/20 294 lb (133.4 kg)  11/05/20 294 lb 14.4 oz (133.8 kg)    Physical Exam Vitals and nursing note reviewed.  Constitutional:      General: She is not in acute distress.    Appearance: Normal appearance. She is not ill-appearing, toxic-appearing or diaphoretic.  HENT:     Head: Normocephalic and atraumatic.     Right Ear: External ear normal.     Left Ear: External ear normal.     Nose: Nose normal.     Mouth/Throat:     Mouth: Mucous membranes are moist.     Pharynx: Oropharynx is clear.  Eyes:     General: No scleral icterus.       Right eye: No discharge.        Left eye: No discharge.     Extraocular Movements: Extraocular movements intact.     Conjunctiva/sclera: Conjunctivae normal.     Pupils: Pupils are equal, round, and reactive to light.  Cardiovascular:     Rate and Rhythm: Normal rate and regular rhythm.     Pulses: Normal pulses.     Heart sounds: Normal heart sounds. No murmur heard. No friction rub. No gallop.   Pulmonary:     Effort: Pulmonary effort is normal. No respiratory distress.     Breath sounds: Normal breath sounds.  No stridor. No wheezing, rhonchi or rales.  Chest:     Chest wall: No tenderness.  Musculoskeletal:        General: Normal range of motion.     Cervical back: Normal range of motion and neck supple.  Skin:    General: Skin is warm and dry.     Capillary Refill: Capillary refill takes less than 2 seconds.     Coloration: Skin is not jaundiced or pale.     Findings: No bruising, erythema, lesion or rash.  Neurological:     General: No focal deficit present.     Mental Status: She is alert and oriented to person, place, and  time. Mental status is at baseline.  Psychiatric:        Mood and Affect: Mood normal.        Behavior: Behavior normal.        Thought Content: Thought content normal.        Judgment: Judgment normal.     Results for orders placed or performed in visit on 03/03/21  CBC with Differential/Platelet  Result Value Ref Range   WBC 10.0 3.4 - 10.8 x10E3/uL   RBC 4.24 3.77 - 5.28 x10E6/uL   Hemoglobin 11.7 11.1 - 15.9 g/dL   Hematocrit 37.4 34.0 - 46.6 %   MCV 88 79 - 97 fL   MCH 27.6 26.6 - 33.0 pg   MCHC 31.3 (L) 31.5 - 35.7 g/dL   RDW 13.7 11.7 - 15.4 %   Platelets 309 150 - 450 x10E3/uL   Neutrophils 72 Not Estab. %   Lymphs 19 Not Estab. %   Monocytes 6 Not Estab. %   Eos 3 Not Estab. %   Basos 0 Not Estab. %   Neutrophils Absolute 7.2 (H) 1.4 - 7.0 x10E3/uL   Lymphocytes Absolute 1.9 0.7 - 3.1 x10E3/uL   Monocytes Absolute 0.6 0.1 - 0.9 x10E3/uL   EOS (ABSOLUTE) 0.3 0.0 - 0.4 x10E3/uL   Basophils Absolute 0.0 0.0 - 0.2 x10E3/uL   Immature Granulocytes 0 Not Estab. %   Immature Grans (Abs) 0.0 0.0 - 0.1 x10E3/uL  Comprehensive metabolic panel  Result Value Ref Range   Glucose WILL FOLLOW    BUN WILL FOLLOW    Creatinine, Ser WILL FOLLOW    eGFR WILL FOLLOW    BUN/Creatinine Ratio WILL FOLLOW    Sodium WILL FOLLOW    Potassium WILL FOLLOW    Chloride WILL FOLLOW    CO2 WILL FOLLOW    Calcium WILL FOLLOW    Total Protein WILL FOLLOW    Albumin WILL FOLLOW    Globulin, Total WILL FOLLOW    Albumin/Globulin Ratio WILL FOLLOW    Bilirubin Total WILL FOLLOW    Alkaline Phosphatase WILL FOLLOW    AST WILL FOLLOW    ALT WILL FOLLOW   Lipid Panel w/o Chol/HDL Ratio  Result Value Ref Range   Cholesterol, Total WILL FOLLOW    Triglycerides WILL FOLLOW    HDL WILL FOLLOW    VLDL Cholesterol Cal WILL FOLLOW    LDL Chol Calc (NIH) WILL FOLLOW    Lipid Comment: WILL FOLLOW   TSH  Result Value Ref Range   TSH WILL FOLLOW   Uric acid  Result Value Ref Range   Uric Acid  5.6 3.0 - 7.2 mg/dL      Assessment & Plan:   Problem List Items Addressed This Visit      Cardiovascular and Mediastinum   Benign  essential hypertension - Primary    Under good control on current regimen. Continue current regimen. Continue to monitor. Call with any concerns. Refills given. Labs drawn today.       Relevant Medications   metoprolol succinate (TOPROL-XL) 50 MG 24 hr tablet   lisinopril-hydrochlorothiazide (ZESTORETIC) 20-25 MG tablet   Other Relevant Orders   CBC with Differential/Platelet (Completed)   Comprehensive metabolic panel (Completed)   Urinalysis, Routine w reflex microscopic   Microalbumin, Urine Waived     Endocrine   Thyroid activity decreased    Rechecking labs today. Await results. Treat as needed.       Relevant Medications   metoprolol succinate (TOPROL-XL) 50 MG 24 hr tablet   Other Relevant Orders   CBC with Differential/Platelet (Completed)   Comprehensive metabolic panel (Completed)   TSH (Completed)     Musculoskeletal and Integument   Acute gout involving toe of right foot    Continues to follow with oncology. Call with any concerns. Stable.       Relevant Medications   allopurinol (ZYLOPRIM) 300 MG tablet   Other Relevant Orders   Uric acid (Completed)     Other   Combined fat and carbohydrate induced hyperlipemia    Under good control on current regimen. Continue current regimen. Continue to monitor. Call with any concerns. Refills given. Labs drawn today.       Relevant Medications   metoprolol succinate (TOPROL-XL) 50 MG 24 hr tablet   lisinopril-hydrochlorothiazide (ZESTORETIC) 20-25 MG tablet   Other Relevant Orders   CBC with Differential/Platelet (Completed)   Comprehensive metabolic panel (Completed)   Lipid Panel w/o Chol/HDL Ratio (Completed)   Morbid obesity (HCC)    Encouraged diet and exercise with goal of losing 1-2lbs per week. Call with any concerns.       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 Call with any concerns. Stable.       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 Call with any concerns. Stable.       Relevant Medications   allopurinol (ZYLOPRIM) 300 MG tablet       Follow up plan: Return in about 6 months (around 09/02/2021).

## 2021-03-04 LAB — URIC ACID: Uric Acid: 5.6 mg/dL (ref 3.0–7.2)

## 2021-03-08 ENCOUNTER — Encounter: Payer: Self-pay | Admitting: Family Medicine

## 2021-03-08 DIAGNOSIS — M109 Gout, unspecified: Secondary | ICD-10-CM | POA: Insufficient documentation

## 2021-03-08 NOTE — Assessment & Plan Note (Signed)
Encouraged diet and exercise with goal of losing 1-2lbs per week. Call with any concerns.  

## 2021-03-08 NOTE — Assessment & Plan Note (Signed)
Under good control on current regimen. Continue current regimen. Continue to monitor. Call with any concerns. Refills given. Labs drawn today.   

## 2021-03-08 NOTE — Assessment & Plan Note (Signed)
Continues to follow with oncology. Call with any concerns. Stable.

## 2021-03-08 NOTE — Assessment & Plan Note (Signed)
Rechecking labs today. Await results. Treat as needed.  °

## 2021-03-15 LAB — COMPREHENSIVE METABOLIC PANEL

## 2021-03-15 LAB — CBC WITH DIFFERENTIAL/PLATELET
Basophils Absolute: 0 10*3/uL (ref 0.0–0.2)
Basos: 0 %
EOS (ABSOLUTE): 0.3 10*3/uL (ref 0.0–0.4)
Eos: 3 %
Hematocrit: 37.4 % (ref 34.0–46.6)
Hemoglobin: 11.7 g/dL (ref 11.1–15.9)
Immature Grans (Abs): 0 10*3/uL (ref 0.0–0.1)
Immature Granulocytes: 0 %
Lymphocytes Absolute: 1.9 10*3/uL (ref 0.7–3.1)
Lymphs: 19 %
MCH: 27.6 pg (ref 26.6–33.0)
MCHC: 31.3 g/dL — ABNORMAL LOW (ref 31.5–35.7)
MCV: 88 fL (ref 79–97)
Monocytes Absolute: 0.6 10*3/uL (ref 0.1–0.9)
Monocytes: 6 %
Neutrophils Absolute: 7.2 10*3/uL — ABNORMAL HIGH (ref 1.4–7.0)
Neutrophils: 72 %
Platelets: 309 10*3/uL (ref 150–450)
RBC: 4.24 x10E6/uL (ref 3.77–5.28)
RDW: 13.7 % (ref 11.7–15.4)
WBC: 10 10*3/uL (ref 3.4–10.8)

## 2021-03-15 LAB — LIPID PANEL W/O CHOL/HDL RATIO

## 2021-03-15 LAB — TSH

## 2021-03-22 ENCOUNTER — Other Ambulatory Visit: Payer: Self-pay | Admitting: Oncology

## 2021-03-22 MED ORDER — LIDOCAINE-PRILOCAINE 2.5-2.5 % EX CREA: TOPICAL_CREAM | Freq: Once | CUTANEOUS | 1 refills | Status: DC | PRN

## 2021-03-25 ENCOUNTER — Inpatient Hospital Stay: Payer: Medicare Other | Attending: Oncology

## 2021-03-25 DIAGNOSIS — C50811 Malignant neoplasm of overlapping sites of right female breast: Secondary | ICD-10-CM | POA: Diagnosis not present

## 2021-03-25 DIAGNOSIS — Z171 Estrogen receptor negative status [ER-]: Secondary | ICD-10-CM | POA: Diagnosis not present

## 2021-03-25 DIAGNOSIS — Z452 Encounter for adjustment and management of vascular access device: Secondary | ICD-10-CM | POA: Insufficient documentation

## 2021-03-25 DIAGNOSIS — Z95828 Presence of other vascular implants and grafts: Secondary | ICD-10-CM

## 2021-03-25 MED ORDER — HEPARIN SOD (PORK) LOCK FLUSH 100 UNIT/ML IV SOLN
INTRAVENOUS | Status: AC
  Filled 2021-03-25: qty 5

## 2021-03-25 MED ORDER — HEPARIN SOD (PORK) LOCK FLUSH 100 UNIT/ML IV SOLN
500.0000 [IU] | Freq: Once | INTRAVENOUS | Status: AC
  Administered 2021-03-25: 500 [IU] via INTRAVENOUS
  Filled 2021-03-25: qty 5

## 2021-03-25 MED ORDER — SODIUM CHLORIDE 0.9% FLUSH
10.0000 mL | INTRAVENOUS | Status: DC | PRN
  Administered 2021-03-25: 10 mL via INTRAVENOUS
  Filled 2021-03-25: qty 10

## 2021-04-30 ENCOUNTER — Other Ambulatory Visit: Payer: Self-pay

## 2021-04-30 DIAGNOSIS — C50919 Malignant neoplasm of unspecified site of unspecified female breast: Secondary | ICD-10-CM

## 2021-05-04 ENCOUNTER — Other Ambulatory Visit: Payer: Self-pay

## 2021-05-04 ENCOUNTER — Ambulatory Visit
Admission: RE | Admit: 2021-05-04 | Discharge: 2021-05-04 | Disposition: A | Discharge status: Home / Self Care | Payer: Medicare Other | Source: Ambulatory Visit | Attending: Oncology | Admitting: Oncology

## 2021-05-04 ENCOUNTER — Inpatient Hospital Stay: Payer: Medicare Other | Attending: Oncology

## 2021-05-04 DIAGNOSIS — C50919 Malignant neoplasm of unspecified site of unspecified female breast: Secondary | ICD-10-CM

## 2021-05-04 DIAGNOSIS — E876 Hypokalemia: Secondary | ICD-10-CM | POA: Insufficient documentation

## 2021-05-04 DIAGNOSIS — C50511 Malignant neoplasm of lower-outer quadrant of right female breast: Secondary | ICD-10-CM | POA: Diagnosis present

## 2021-05-04 DIAGNOSIS — Z171 Estrogen receptor negative status [ER-]: Secondary | ICD-10-CM | POA: Insufficient documentation

## 2021-05-04 LAB — CBC WITH DIFFERENTIAL/PLATELET
Abs Immature Granulocytes: 0.06 10*3/uL (ref 0.00–0.07)
Basophils Absolute: 0 10*3/uL (ref 0.0–0.1)
Basophils Relative: 0 %
Eosinophils Absolute: 0.3 10*3/uL (ref 0.0–0.5)
Eosinophils Relative: 2 %
HCT: 35.6 % — ABNORMAL LOW (ref 36.0–46.0)
Hemoglobin: 11.2 g/dL — ABNORMAL LOW (ref 12.0–15.0)
Immature Granulocytes: 1 %
Lymphocytes Relative: 20 %
Lymphs Abs: 2.1 10*3/uL (ref 0.7–4.0)
MCH: 28.5 pg (ref 26.0–34.0)
MCHC: 31.5 g/dL (ref 30.0–36.0)
MCV: 90.6 fL (ref 80.0–100.0)
Monocytes Absolute: 0.7 10*3/uL (ref 0.1–1.0)
Monocytes Relative: 6 %
Neutro Abs: 7.6 10*3/uL (ref 1.7–7.7)
Neutrophils Relative %: 71 %
Platelets: 311 10*3/uL (ref 150–400)
RBC: 3.93 MIL/uL (ref 3.87–5.11)
RDW: 15.2 % (ref 11.5–15.5)
WBC: 10.8 10*3/uL — ABNORMAL HIGH (ref 4.0–10.5)
nRBC: 0 % (ref 0.0–0.2)

## 2021-05-04 LAB — COMPREHENSIVE METABOLIC PANEL
ALT: 19 U/L (ref 0–44)
AST: 22 U/L (ref 15–41)
Albumin: 3.5 g/dL (ref 3.5–5.0)
Alkaline Phosphatase: 73 U/L (ref 38–126)
Anion gap: 10 (ref 5–15)
BUN: 19 mg/dL (ref 8–23)
CO2: 30 mmol/L (ref 22–32)
Calcium: 9.3 mg/dL (ref 8.9–10.3)
Chloride: 96 mmol/L — ABNORMAL LOW (ref 98–111)
Creatinine, Ser: 0.91 mg/dL (ref 0.44–1.00)
GFR, Estimated: >60 mL/min (ref >60)
Glucose, Bld: 101 mg/dL — ABNORMAL HIGH (ref 70–99)
Potassium: 3.4 mmol/L — ABNORMAL LOW (ref 3.5–5.1)
Sodium: 136 mmol/L (ref 135–145)
Total Bilirubin: 0.2 mg/dL — ABNORMAL LOW (ref 0.3–1.2)
Total Protein: 7.2 g/dL (ref 6.5–8.1)

## 2021-05-04 MED ORDER — SODIUM CHLORIDE 0.9% FLUSH
10.0000 mL | INTRAVENOUS | Status: DC | PRN
  Administered 2021-05-04: 10 mL via INTRAVENOUS
  Filled 2021-05-04: qty 10

## 2021-05-04 MED ORDER — HEPARIN SOD (PORK) LOCK FLUSH 100 UNIT/ML IV SOLN
INTRAVENOUS | Status: AC
  Filled 2021-05-04: qty 5

## 2021-05-04 MED ORDER — HEPARIN SOD (PORK) LOCK FLUSH 100 UNIT/ML IV SOLN
500.0000 [IU] | Freq: Once | INTRAVENOUS | Status: AC
  Administered 2021-05-04: 500 [IU] via INTRAVENOUS
  Filled 2021-05-04: qty 5

## 2021-05-05 LAB — CANCER ANTIGEN 27.29: CA 27.29: 6.6 U/mL (ref 0.0–38.6)

## 2021-05-05 LAB — CANCER ANTIGEN 15-3: CA 15-3: 10 U/mL (ref 0.0–25.0)

## 2021-05-12 ENCOUNTER — Inpatient Hospital Stay: Payer: Medicare Other

## 2021-05-12 ENCOUNTER — Inpatient Hospital Stay (HOSPITAL_BASED_OUTPATIENT_CLINIC_OR_DEPARTMENT_OTHER): Payer: Medicare Other | Admitting: Oncology

## 2021-05-12 ENCOUNTER — Encounter: Payer: Self-pay | Admitting: Oncology

## 2021-05-12 VITALS — BP 142/60 | HR 67 | Temp 97.7°F | Resp 18 | Wt 303.3 lb

## 2021-05-12 DIAGNOSIS — E876 Hypokalemia: Secondary | ICD-10-CM

## 2021-05-12 DIAGNOSIS — Z95828 Presence of other vascular implants and grafts: Secondary | ICD-10-CM | POA: Diagnosis not present

## 2021-05-12 DIAGNOSIS — C50919 Malignant neoplasm of unspecified site of unspecified female breast: Secondary | ICD-10-CM

## 2021-05-12 DIAGNOSIS — C50511 Malignant neoplasm of lower-outer quadrant of right female breast: Secondary | ICD-10-CM | POA: Diagnosis not present

## 2021-05-12 NOTE — Progress Notes (Signed)
Hematology/Oncology Follow up note [facility] Telephone:([phone]) [phone] Fax:(336) [phone]   Patient Care Team: Valerie Roys, DO as PCP - General (Family Medicine) Thornton Park, MD as Referring Physician (Orthopedic Surgery) Rico Junker, RN as Oncology Nurse Navigator Byrnett, Forest Gleason, MD (General Surgery) Earlie Server, MD as Medical Oncologist (Medical Oncology) Noreene Filbert, MD as Referring Physician (Radiation Oncology)  REASON FOR VISIT Follow up for treatment of Breast cancer, assess of chemotherapy toxicity.  HISTORY OF PRESENTING ILLNESS:  Wendy Marsh is a  65 y.o.  female with PMH listed below who was referred to me for evaluation of breast cancer  04/28/2019 diagnostic mammogram Mammogram showed right breast 6 o'clock axis breast mass, 2.9 cm corresponding to area of concern.  cT2N0  Breast mass was biopsied, and pathology showed invasive mammary carcinoma.  Grade 3 , ER/PR- HER2 -.  LVI suspicious, favor present.   #  baseline MUGA testing done and reviewed systolic function depression, LVEF 45%. Given that Adriamycin has cardiac toxicity, potentially can lead to irreversible cardiomyopathy, I discussed with patient about using non-anthracycline-containing regimen. # 05/23/2019- 09/11/2018  S/p 6 cycle of Docetaxel $RemoveBefo'75mg'NWdGipigmbF$ /m2 and Carboplatin every 3 weeks [ data from [place] et al 2018 Clin Cancer Res, Pathological Response and Survival in Triple-Negative Breast Cancer Following Neoadjuvant Carboplatin plus Docetaxel.Stage I-III TNBC patients, Neoadjuvant carboplatin (AUC6) plus docetaxel (75 mg/m2) every 21 days  6 cycles. pCR was 55% and Residual cancer burden (RCB) were 13%. Excellent Three-year RFS was 90% in patients with pCR and 66% in those without pCR]    # 10/29/2018   right breast lumpectomy with  excisional biopsy of the right axillary deep sentinel lymph nodes. Pathology showed CR  ypT0, ypN0- complete  pathological remission.   # # Family history of breast cancer plus personal history of triple negative breast cancer. Testing did not reveal a pathogenic mutation in any of the genes analyzed.   BRCA negative.  VUS was detected. Does not change management.    INTERVAL HISTORY Wendy Marsh is a 65 y.o. female who has above history reviewed by me today presents for discussion of pathology and further management plan for triple negative breast cancer. Patient has no new complaints or breast concerns.  Chronic joint pain.  .. Review of Systems  Constitutional:  Negative for chills, fever, malaise/fatigue and weight loss.  HENT:  Negative for nosebleeds and sore throat.   Eyes:  Negative for double vision, photophobia and redness.  Respiratory:  Negative for cough, shortness of breath and wheezing.   Cardiovascular:  Negative for chest pain, palpitations, orthopnea and leg swelling.  Gastrointestinal:  Negative for abdominal pain, blood in stool, nausea and vomiting.  Genitourinary:  Negative for dysuria.  Musculoskeletal:  Positive for joint pain. Negative for back pain, myalgias and neck pain.       Chronic right knee pain.  Skin:  Negative for itching and rash.  Neurological:  Negative for dizziness, tingling and tremors.  Endo/Heme/Allergies:  Negative for environmental allergies. Does not bruise/bleed easily.  Psychiatric/Behavioral:  Negative for depression and hallucinations.    MEDICAL HISTORY:  Past Medical History:  Diagnosis Date   Arthritis    Atrial fibrillation ([place])    Breast cancer ([place]) 04/2018   right breast   Dysrhythmia    afib   Hyperlipidemia    Hypertension    Hypotension due to drugs 05/30/2018   Hypothyroidism    Lumbago    Osteoporosis  Personal history of chemotherapy    Personal history of radiation therapy    Thyroid disease     SURGICAL HISTORY: Past Surgical History:  Procedure Laterality Date   BREAST BIOPSY Right 05/02/2018    Invasive mammary carcinoma, grade 3, neo adj chemo   BREAST LUMPECTOMY Right 10/29/2018   NO RESIDUAL MALIGNANCY IDENTIFIED.    BREAST LUMPECTOMY WITH NEEDLE LOCALIZATION AND AXILLARY SENTINEL LYMPH NODE BX Right 10/29/2018   Procedure: BREAST LUMPECTOMY WITH NEEDLE LOCALIZATION AND SENTINEL LYMPH NODE BX;  Surgeon: Vickie Epley, MD;  Location: [facility] ORS;  Service: General;  Laterality: Right;   JOINT REPLACEMENT Left    tkr   PORTACATH PLACEMENT Right 05/11/2018   Procedure: INSERTION  I.J PORT-A-CATH;  Surgeon: Robert Bellow, MD;  Location: [facility] ORS;  Service: General;  Laterality: Right;   REPLACEMENT TOTAL KNEE      SOCIAL HISTORY: Social History   Socioeconomic History   Marital status: Married    Spouse name: jerry   Number of children: 3   Years of education: Not on file   Highest education level: Some college, no degree  Occupational History   Not on file  Tobacco Use   Smoking status: Never   Smokeless tobacco: Never  Vaping Use   Vaping Use: Never used  Substance and Sexual Activity   Alcohol use: No   Drug use: No   Sexual activity: Yes    Birth control/protection: Post-menopausal  Other Topics Concern   Not on file  Social History Narrative   Not on file   Social Determinants of Health   Financial Resource Strain: Not on file  Food Insecurity: Not on file  Transportation Needs: Not on file  Physical Activity: Not on file  Stress: Not on file  Social Connections: Not on file  Intimate Partner Violence: Not on file    FAMILY HISTORY: Family History  Problem Relation Age of Onset   Diabetes Mother    Congestive Heart Failure Mother    Breast cancer Maternal Grandmother        dx 37s; deceased 44s   Cancer Maternal Uncle 39       deceased 51s   Other Father        No information on father or paternal relatives   Breast cancer Other 68       paternal half-sister; also esophageal ca at 33; currently 30   Diabetes Other    Hypertension Other     Kidney disease Other    Thyroid disease Other     ALLERGIES:  is allergic to gabapentin, morphine and related, peanut-containing drug products, and tape.  MEDICATIONS:  Current Outpatient Medications  Medication Sig Dispense Refill   acetaminophen (TYLENOL) 500 MG tablet Take 500 mg by mouth every 6 (six) hours as needed.     allopurinol (ZYLOPRIM) 300 MG tablet Take 1 tablet (300 mg total) by mouth daily. 90 tablet 1   apixaban (ELIQUIS) 5 MG TABS tablet Take 5 mg by mouth in the morning and at bedtime.     flecainide (TAMBOCOR) 100 MG tablet Take 100 mg by mouth 2 (two) times daily.     levothyroxine (SYNTHROID) 175 MCG tablet Take 1 tablet (175 mcg total) by mouth daily before breakfast. 90 tablet 3   lidocaine-prilocaine (EMLA) cream Apply topically once as needed for up to 1 dose. 30 g 1   lisinopril-hydrochlorothiazide (ZESTORETIC) 20-25 MG tablet Take 2 tablets by mouth daily. 180 tablet 1   metoprolol  succinate (TOPROL-XL) 50 MG 24 hr tablet TAKE 1 TABLET BY MOUTH ONCE DAILY WITH  OR  IMMEDIATELY  FOLLOWING  A  MEAL 90 tablet 1   EPINEPHrine 0.3 mg/0.3 mL IJ SOAJ injection Inject 0.3 mg into the muscle as needed for anaphylaxis. (Patient not taking: Reported on 05/12/2021) 1 each 12   No current facility-administered medications for this visit.     PHYSICAL EXAMINATION: ECOG PERFORMANCE STATUS: 1 - Symptomatic but completely ambulatory Vitals:   05/12/21 1445  BP: (!) 142/60  Pulse: 67  Resp: 18  Temp: 97.7 F (36.5 C)   Filed Weights   05/12/21 1445  Weight: (!) 303 lb 4.8 oz (137.6 kg)    Physical Exam Constitutional:      General: She is not in acute distress.    Appearance: She is obese.     Comments: Obese, walks with a walker  HENT:     Head: Normocephalic and atraumatic.  Eyes:     General: No scleral icterus.    Pupils: Pupils are equal, round, and reactive to light.  Cardiovascular:     Rate and Rhythm: Normal rate and regular rhythm.     Heart  sounds: Normal heart sounds.  Pulmonary:     Effort: Pulmonary effort is normal. No respiratory distress.     Breath sounds: No wheezing.  Abdominal:     General: Bowel sounds are normal. There is no distension.     Palpations: Abdomen is soft. There is no mass.     Tenderness: There is no abdominal tenderness.  Musculoskeletal:        General: No deformity. Normal range of motion.     Cervical back: Normal range of motion and neck supple.     Comments: Chronic lower extremity edema.  1+  Skin:    General: Skin is warm and dry.     Findings: No erythema or rash.  Neurological:     Mental Status: She is alert and oriented to person, place, and time. Mental status is at baseline.     Cranial Nerves: No cranial nerve deficit.     Coordination: Coordination normal.  Psychiatric:        Mood and Affect: Mood normal.      LABORATORY DATA:  I have reviewed the data as listed Lab Results  Component Value Date   WBC 10.8 (H) 05/04/2021   HGB 11.2 (L) 05/04/2021   HCT 35.6 (L) 05/04/2021   MCV 90.6 05/04/2021   PLT 311 05/04/2021   Recent Labs    06/16/20 1047 10/30/20 1320 12/01/20 1408 02/16/21 0906 03/03/21 1011 05/04/21 0848  NA 141 134*   < > 141 CANCELED 136  K 3.6 3.1*   < > 4.2 CANCELED 3.4*  CL 99 95*   < > 95* CANCELED 96*  CO2 26 28   < > 24 CANCELED 30  GLUCOSE 90 124*   < > 87 CANCELED 101*  BUN 18 17   < > 18 CANCELED 19  CREATININE 0.84 0.90   < > 0.85 CANCELED 0.91  CALCIUM 9.1 8.9   < > 9.4 CANCELED 9.3  GFRNONAA 73 >60  --   --   --  >60  GFRAA 84  --   --   --   --   --   PROT 6.7 7.9  --  6.9 CANCELED 7.2  ALBUMIN 3.9 3.5  --  4.0 CANCELED 3.5  AST 14 23  --  12 CANCELED 22  ALT 16 21  --  21 CANCELED 19  ALKPHOS 80 66  --  95 CANCELED 73  BILITOT 0.3 0.5  --  0.2 CANCELED 0.2*   < > = values in this interval not displayed.    RADIOGRAPHIC STUDIES: I have personally reviewed the radiological images as listed and agreed with the findings in the  report. NM Cardiac Muga rest: Calculated LEFT ventricular ejection fraction equals 45%   ASSESSMENT & PLAN:  1. 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[place])   2. Port-A-Cath in place   3. 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 .  Status post right lumpectomy with excisional biopsy of sentinel lymph node. S/p radiation.  Complete pathological remission,  ypT0 ypN0 Clinically she is doing very well. No signs of disease recurrence.  Labs are reviewed and discussed with patient.  05/04/2021 bilateral diagnostic mammogram showed light benign left breast mass at 2o 'clock.  Stable right breast lumpectomy sites. No mammographic evidence of malignancy in bilateral breasts.  Continue annual mammogram.   # Port A cath in place. She is about 2.5 years since her chemotherapy and surgery.  Ok to remove medi port. Refer her to Dr.Byrnett.    #Chronic hypo-kalemia, potassium is 3.4.   Orders Placed This Encounter  Procedures   CBC with Differential/Platelet    Standing Status:   Future    Standing Expiration Date:   05/12/2022   Comprehensive metabolic panel    Standing Status:   Future    Standing Expiration Date:   05/12/2022   Cancer antigen 15-3    Standing Status:   Future    Standing Expiration Date:   05/12/2022   Cancer antigen 27.29    Standing Status:   Future    Standing Expiration Date:   05/12/2022   Ambulatory referral to General Surgery    Referral Priority:   Routine    Referral Type:   Surgical     Referral Reason:   Specialty Services Required    Requested Specialty:   General Surgery    Number of Visits Requested:   1    Return of visit: 6 months with repeat CBC and CMP, CA 15.3, CA 27.29,  Earlie Server, MD, PhD 05/12/2021

## 2021-05-12 NOTE — Progress Notes (Signed)
Pt here for follow up. No new concerns voiced.   

## 2021-05-24 ENCOUNTER — Ambulatory Visit: Payer: Medicare Other | Admitting: Surgery

## 2021-06-29 ENCOUNTER — Ambulatory Visit (INDEPENDENT_AMBULATORY_CARE_PROVIDER_SITE_OTHER): Payer: Medicare Other

## 2021-06-29 DIAGNOSIS — Z1211 Encounter for screening for malignant neoplasm of colon: Secondary | ICD-10-CM

## 2021-06-29 DIAGNOSIS — Z0000 Encounter for general adult medical examination without abnormal findings: Secondary | ICD-10-CM

## 2021-06-29 NOTE — Progress Notes (Signed)
Subjective:   Wendy Marsh is a 65 y.o. female who presents for Medicare Annual (Subsequent) preventive examination.  I connected with  Wendy Marsh on 06/29/21 by an audio only telemedicine application and verified that I am speaking with the correct person using two identifiers.   I discussed the limitations, risks, security and privacy concerns of performing an evaluation and management service by telephone and the availability of in person appointments. I also discussed with the patient that there may be a patient responsible charge related to this service. The patient expressed understanding and verbally consented to this telephonic visit.  Location of Patient: Home Location of Provider: Office  List any persons and their role that are participating in the visit with the patient.    Review of Systems    Defer to PCP Cardiac Risk Factors include: none     Objective:    There were no vitals filed for this visit. There is no height or weight on file to calculate BMI.  Advanced Directives 06/29/2021 05/12/2021 11/05/2020 05/08/2020 12/11/2019 09/12/2019 08/19/2019  Does Patient Have a Medical Advance Directive? No No No No No No No  Would patient like information on creating a medical advance directive? No - Patient declined - No - Patient declined - - - No - Patient declined    Current Medications (verified) Outpatient Encounter Medications as of 06/29/2021  Medication Sig   acetaminophen (TYLENOL) 500 MG tablet Take 500 mg by mouth every 6 (six) hours as needed.   allopurinol (ZYLOPRIM) 300 MG tablet Take 1 tablet (300 mg total) by mouth daily.   apixaban (ELIQUIS) 5 MG TABS tablet Take 5 mg by mouth in the morning and at bedtime.   EPINEPHrine 0.3 mg/0.3 mL IJ SOAJ injection Inject 0.3 mg into the muscle as needed for anaphylaxis.   flecainide (TAMBOCOR) 100 MG tablet Take 100 mg by mouth 2 (two) times daily.   levothyroxine (SYNTHROID) 175 MCG tablet Take 1 tablet (175  mcg total) by mouth daily before breakfast.   lisinopril-hydrochlorothiazide (ZESTORETIC) 20-25 MG tablet Take 2 tablets by mouth daily.   metoprolol succinate (TOPROL-XL) 50 MG 24 hr tablet TAKE 1 TABLET BY MOUTH ONCE DAILY WITH  OR  IMMEDIATELY  FOLLOWING  A  MEAL   [DISCONTINUED] lidocaine-prilocaine (EMLA) cream Apply topically once as needed for up to 1 dose.   No facility-administered encounter medications on file as of 06/29/2021.    Allergies (verified) Gabapentin, Morphine and related, Peanut-containing drug products, and Tape   History: Past Medical History:  Diagnosis Date   Arthritis    Atrial fibrillation ([place])    Breast cancer ([place]) 04/2018   right breast   Dysrhythmia    afib   Hyperlipidemia    Hypertension    Hypotension due to drugs 05/30/2018   Hypothyroidism    Lumbago    Osteoporosis    Personal history of chemotherapy    Personal history of radiation therapy    Thyroid disease    Past Surgical History:  Procedure Laterality Date   BREAST BIOPSY Right 05/02/2018   Invasive mammary carcinoma, grade 3, neo adj chemo   BREAST LUMPECTOMY Right 10/29/2018   NO RESIDUAL MALIGNANCY IDENTIFIED.    BREAST LUMPECTOMY WITH NEEDLE LOCALIZATION AND AXILLARY SENTINEL LYMPH NODE BX Right 10/29/2018   Procedure: BREAST LUMPECTOMY WITH NEEDLE LOCALIZATION AND SENTINEL LYMPH NODE BX;  Surgeon: Vickie Epley, MD;  Location: [facility] ORS;  Service: General;  Laterality: Right;   JOINT REPLACEMENT Left  tkr   PORTACATH PLACEMENT Right 05/11/2018   Procedure: INSERTION  I.J PORT-A-CATH;  Surgeon: Robert Bellow, MD;  Location: [facility] ORS;  Service: General;  Laterality: Right;   REPLACEMENT TOTAL KNEE     Family History  Problem Relation Age of Onset   Diabetes Mother    Congestive Heart Failure Mother    Breast cancer Maternal Grandmother        dx 22s; deceased 36s   Cancer Maternal Uncle 46       deceased 69s   Other Father        No information on father or  paternal relatives   Breast cancer Other 41       paternal half-sister; also esophageal ca at 20; currently 26   Diabetes Other    Hypertension Other    Kidney disease Other    Thyroid disease Other    Social History   Socioeconomic History   Marital status: Married    Spouse name: jerry   Number of children: 3   Years of education: Not on file   Highest education level: Some college, no degree  Occupational History   Not on file  Tobacco Use   Smoking status: Never   Smokeless tobacco: Never  Vaping Use   Vaping Use: Never used  Substance and Sexual Activity   Alcohol use: No   Drug use: No   Sexual activity: Yes    Birth control/protection: Post-menopausal  Other Topics Concern   Not on file  Social History Narrative   Not on file   Social Determinants of Health   Financial Resource Strain: Low Risk    Difficulty of Paying Living Expenses: Not hard at all  Food Insecurity: No Food Insecurity   Worried About Charity fundraiser in the Last Year: Never true   Ran Out of Food in the Last Year: Never true  Transportation Needs: No Transportation Needs   Lack of Transportation (Medical): No   Lack of Transportation (Non-Medical): No  Physical Activity: Inactive   Days of Exercise per Week: 0 days   Minutes of Exercise per Session: 0 min  Stress: No Stress Concern Present   Feeling of Stress : Not at all  Social Connections: Moderately Isolated   Frequency of Communication with Friends and Family: More than three times a week   Frequency of Social Gatherings with Friends and Family: Twice a week   Attends Religious Services: Never   Marine scientist or Organizations: No   Attends Music therapist: Never   Marital Status: Married    Tobacco Counseling Counseling given: Not Answered   Clinical Intake:  Pre-visit preparation completed: Yes  Pain : No/denies pain     Nutritional Risks: None Diabetes: No  How often do you need to have  someone help you when you read instructions, pamphlets, or other written materials from your doctor or pharmacy?: 1 - Never What is the last grade level you completed in school?: some college  Diabetic?No  Interpreter Needed?: No      Activities of Daily Living In your present state of health, do you have any difficulty performing the following activities: 06/29/2021 03/03/2021  Hearing? N N  Vision? N N  Difficulty concentrating or making decisions? N N  Walking or climbing stairs? N N  Dressing or bathing? N N  Doing errands, shopping? N N  Preparing Food and eating ? N -  Using the Toilet? N -  In the  past six months, have you accidently leaked urine? N -  Do you have problems with loss of bowel control? N -  Managing your Medications? N -  Managing your Finances? N -  Housekeeping or managing your Housekeeping? N -  Some recent data might be hidden    Patient Care Team: Valerie Roys, DO as PCP - General (Family Medicine) Thornton Park, MD as Referring Physician (Orthopedic Surgery) Rico Junker, RN as Oncology Nurse Navigator Byrnett, Forest Gleason, MD (General Surgery) Earlie Server, MD as Medical Oncologist (Medical Oncology) Noreene Filbert, MD as Referring Physician (Radiation Oncology)  Indicate any recent Medical Services you may have received from other than [facility] providers in the past year (date may be approximate).     Assessment:   This is a routine wellness examination for [place].  Hearing/Vision screen No results found.  Dietary issues and exercise activities discussed: Current Exercise Habits: The patient does not participate in regular exercise at present, Exercise limited by: orthopedic condition(s)   Goals Addressed   None   Depression Screen PHQ 2/9 Scores 06/29/2021 03/03/2021 06/16/2020 05/21/2019 03/06/2018 05/10/2017 02/26/2016  PHQ - 2 Score 0 0 0 0 0 0 0  PHQ- 9 Score - - 0 - - - -    Fall Risk Fall Risk  06/29/2021 03/03/2021 06/16/2020 05/21/2019  11/08/2018  Falls in the past year? 0 0 0 0 0  Number falls in past yr: 0 0 - 0 -  Injury with Fall? 0 0 - 0 -  Risk for fall due to : No Fall Risks Impaired balance/gait - - -  Follow up Falls evaluation completed Falls evaluation completed Falls evaluation completed - -    FALL RISK PREVENTION PERTAINING TO THE HOME:  Any stairs in or around the home? Yes  If so, are there any without handrails? No  Home free of loose throw rugs in walkways, pet beds, electrical cords, etc? Yes  Adequate lighting in your home to reduce risk of falls? Yes   ASSISTIVE DEVICES UTILIZED TO PREVENT FALLS:  Life alert? No  Use of a cane, walker or w/c? Yes  Grab bars in the bathroom? No  Shower chair or bench in shower? No  Elevated toilet seat or a handicapped toilet? No   TIMED UP AND GO:  Was the test performed?  N/A .  Length of time to ambulate 10 feet: N/A sec.     Cognitive Function:     6CIT Screen 06/29/2021 06/16/2020  What Year? 0 points 0 points  What month? 0 points 0 points  What time? 0 points 0 points  Count back from 20 0 points 0 points  Months in reverse 0 points 0 points  Repeat phrase 0 points 0 points  Total Score 0 0    Immunizations Immunization History  Administered Date(s) Administered   Fluad Quad(high Dose 65+) 06/16/2020   Moderna Sars-Covid-2 Vaccination 02/18/2020, 03/17/2020   Pneumococcal Conjugate-13 06/16/2020   Td 12/17/2008   Tdap 05/21/2019    TDAP status: Up to date  Flu Vaccine status: Due, Education has been provided regarding the importance of this vaccine. Advised may receive this vaccine at local pharmacy or Health Dept. Aware to provide a copy of the vaccination record if obtained from local pharmacy or Health Dept. Verbalized acceptance and understanding.  Pneumococcal vaccine status: Up to date  Covid-19 vaccine status: Information provided on how to obtain vaccines.   Qualifies for Shingles Vaccine? Yes   Zostavax completed No  Shingrix Completed?: No.    Education has been provided regarding the importance of this vaccine. Patient has been advised to call insurance company to determine out of pocket expense if they have not yet received this vaccine. Advised may also receive vaccine at local pharmacy or Health Dept. Verbalized acceptance and understanding.  Screening Tests Health Maintenance  Topic Date Due   Zoster Vaccines- Shingrix (1 of 2) Never done   COLONOSCOPY (Pts 45-5yrs Insurance coverage will need to be confirmed)  Never done   INFLUENZA VACCINE  05/03/2021   COVID-19 Vaccine (3 - Moderna risk series) 11/24/2021 (Originally 04/14/2020)   MAMMOGRAM  05/05/2023   TETANUS/TDAP  05/20/2029   DEXA SCAN  Completed   Hepatitis C Screening  Completed   HPV VACCINES  Aged Out    Health Maintenance  Health Maintenance Due  Topic Date Due   Zoster Vaccines- Shingrix (1 of 2) Never done   COLONOSCOPY (Pts 45-36yrs Insurance coverage will need to be confirmed)  Never done   INFLUENZA VACCINE  05/03/2021    Cologuard ordered today.   Mammogram status: Completed 05/04/21. Repeat every year  Bone Density status: Completed 11/02/20. Results reflect: Bone density results: NORMAL. Repeat every 5 years.  Lung Cancer Screening: (Low Dose CT Chest recommended if Age 12-80 years, 30 pack-year currently smoking OR have quit w/in 15years.) does not qualify.   Lung Cancer Screening Referral: N/A  Additional Screening:  Hepatitis C Screening: does qualify; Completed 05/21/19  Vision Screening: Recommended annual ophthalmology exams for early detection of glaucoma and other disorders of the eye. Is the patient up to date with their annual eye exam?  No  Who is the provider or what is the name of the office in which the patient attends annual eye exams? [facility] If pt is not established with a provider, would they like to be referred to a provider to establish care? No .   Dental Screening: Recommended annual  dental exams for proper oral hygiene  Community Resource Referral / Chronic Care Management: CRR required this visit?  No   CCM required this visit?  No      Plan:     I have personally reviewed and noted the following in the patient's chart:   Medical and social history Use of alcohol, tobacco or illicit drugs  Current medications and supplements including opioid prescriptions.  Functional ability and status Nutritional status Physical activity Advanced directives List of other physicians Hospitalizations, surgeries, and ER visits in previous 12 months Vitals Screenings to include cognitive, depression, and falls Referrals and appointments  In addition, I have reviewed and discussed with patient certain preventive protocols, quality metrics, and best practice recommendations. A written personalized care plan for preventive services as well as general preventive health recommendations were provided to patient.   Ms. Hund , Thank you for taking time to come for your Medicare Wellness Visit. I appreciate your ongoing commitment to your health goals. Please review the following plan we discussed and let me know if I can assist you in the future.   These are the goals we discussed:  Goals   None     This is a list of the screening recommended for you and due dates:  Health Maintenance  Topic Date Due   Zoster (Shingles) Vaccine (1 of 2) Never done   Colon Cancer Screening  Never done   Flu Shot  05/03/2021   COVID-19 Vaccine (3 - Moderna risk series) 11/24/2021*   Mammogram  05/05/2023   Tetanus Vaccine  05/20/2029   DEXA scan (bone density measurement)  Completed   Hepatitis C Screening: USPSTF Recommendation to screen - Ages 38-79 yo.  Completed   HPV Vaccine  Aged Out  *Topic was postponed. The date shown is not the original due date.        Georgina Peer, [place]   06/29/2021   Nurse Notes: Non Face to Face 60 Minutes

## 2021-06-29 NOTE — Patient Instructions (Signed)
Health Maintenance, Female Adopting a healthy lifestyle and getting preventive care are important in promoting health and wellness. Ask your health care provider about: The right schedule for you to have regular tests and exams. Things you can do on your own to prevent diseases and keep yourself healthy. What should I know about diet, weight, and exercise? Eat a healthy diet  Eat a diet that includes plenty of vegetables, fruits, low-fat dairy products, and lean protein. Do not eat a lot of foods that are high in solid fats, added sugars, or sodium. Maintain a healthy weight Body mass index (BMI) is used to identify weight problems. It estimates body fat based on height and weight. Your health care provider can help determine your BMI and help you achieve or maintain a healthy weight. Get regular exercise Get regular exercise. This is one of the most important things you can do for your health. Most adults should: Exercise for at least 150 minutes each week. The exercise should increase your heart rate and make you sweat (moderate-intensity exercise). Do strengthening exercises at least twice a week. This is in addition to the moderate-intensity exercise. Spend less time sitting. Even light physical activity can be beneficial. Watch cholesterol and blood lipids Have your blood tested for lipids and cholesterol at 66 years of age, then have this test every 5 years. Have your cholesterol levels checked more often if: Your lipid or cholesterol levels are high. You are older than 66 years of age. You are at high risk for heart disease. What should I know about cancer screening? Depending on your health history and family history, you may need to have cancer screening at various ages. This may include screening for: Breast cancer. Cervical cancer. Colorectal cancer. Skin cancer. Lung cancer. What should I know about heart disease, diabetes, and high blood pressure? Blood pressure and heart  disease High blood pressure causes heart disease and increases the risk of stroke. This is more likely to develop in people who have high blood pressure readings, are of African descent, or are overweight. Have your blood pressure checked: Every 3-5 years if you are 18-39 years of age. Every year if you are 40 years old or older. Diabetes Have regular diabetes screenings. This checks your fasting blood sugar level. Have the screening done: Once every three years after age 40 if you are at a normal weight and have a low risk for diabetes. More often and at a younger age if you are overweight or have a high risk for diabetes. What should I know about preventing infection? Hepatitis B If you have a higher risk for hepatitis B, you should be screened for this virus. Talk with your health care provider to find out if you are at risk for hepatitis B infection. Hepatitis C Testing is recommended for: Everyone born from 1945 through 1965. Anyone with known risk factors for hepatitis C. Sexually transmitted infections (STIs) Get screened for STIs, including gonorrhea and chlamydia, if: You are sexually active and are younger than 66 years of age. You are older than 66 years of age and your health care provider tells you that you are at risk for this type of infection. Your sexual activity has changed since you were last screened, and you are at increased risk for chlamydia or gonorrhea. Ask your health care provider if you are at risk. Ask your health care provider about whether you are at high risk for HIV. Your health care provider may recommend a prescription medicine   to help prevent HIV infection. If you choose to take medicine to prevent HIV, you should first get tested for HIV. You should then be tested every 3 months for as long as you are taking the medicine. Pregnancy If you are about to stop having your period (premenopausal) and you may become pregnant, seek counseling before you get  pregnant. Take 400 to 800 micrograms (mcg) of folic acid every day if you become pregnant. Ask for birth control (contraception) if you want to prevent pregnancy. Osteoporosis and menopause Osteoporosis is a disease in which the bones lose minerals and strength with aging. This can result in bone fractures. If you are 65 years old or older, or if you are at risk for osteoporosis and fractures, ask your health care provider if you should: Be screened for bone loss. Take a calcium or vitamin D supplement to lower your risk of fractures. Be given hormone replacement therapy (HRT) to treat symptoms of menopause. Follow these instructions at home: Lifestyle Do not use any products that contain nicotine or tobacco, such as cigarettes, e-cigarettes, and chewing tobacco. If you need help quitting, ask your health care provider. Do not use street drugs. Do not share needles. Ask your health care provider for help if you need support or information about quitting drugs. Alcohol use Do not drink alcohol if: Your health care provider tells you not to drink. You are pregnant, may be pregnant, or are planning to become pregnant. If you drink alcohol: Limit how much you use to 0-1 drink a day. Limit intake if you are breastfeeding. Be aware of how much alcohol is in your drink. In the [place] one drink equals one 12 oz bottle of beer (355 mL), one 5 oz glass of wine (148 mL), or one 1 oz glass of hard liquor (44 mL). General instructions Schedule regular health, dental, and eye exams. Stay current with your vaccines. Tell your health care provider if: You often feel depressed. You have ever been abused or do not feel safe at home. Summary Adopting a healthy lifestyle and getting preventive care are important in promoting health and wellness. Follow your health care provider's instructions about healthy diet, exercising, and getting tested or screened for diseases. Follow your health care provider's  instructions on monitoring your cholesterol and blood pressure. This information is not intended to replace advice given to you by your health care provider. Make sure you discuss any questions you have with your health care provider. Document Revised: 11/27/2020 Document Reviewed: 09/12/2018 Elsevier Patient Education  2022 Elsevier Inc.  

## 2021-07-14 ENCOUNTER — Other Ambulatory Visit: Payer: Self-pay | Admitting: Family Medicine

## 2021-07-14 NOTE — Telephone Encounter (Signed)
Requested Prescriptions  Pending Prescriptions Disp Refills  . allopurinol (ZYLOPRIM) 300 MG tablet [Pharmacy Med Name: Allopurinol 300 MG Oral Tablet] 90 tablet 1    Sig: Take 1 tablet by mouth once daily     Endocrinology:  Gout Agents Passed - 07/14/2021  9:39 AM      Passed - Uric Acid in normal range and within 360 days    Uric Acid  Date Value Ref Range Status  03/03/2021 5.6 3.0 - 7.2 mg/dL Final    Comment:               Therapeutic target for gout patients: <6.0         Passed - Cr in normal range and within 360 days    Creatinine, Ser  Date Value Ref Range Status  05/04/2021 0.91 0.44 - 1.00 mg/dL Final         Passed - Valid encounter within last 12 months    Recent Outpatient Visits          4 months ago Benign essential hypertension   [facility] [place], Megan P, DO   7 months ago [place] toe pain, right   [employer], [place], DO   7 months ago [place] toe pain, right   [employer], Stigler T, NP   11 months ago Anemia, unspecified type   [employer], [place], DO   1 year ago [employer] [facility] [employer] preventive visit   [employer], [place], DO      Future Appointments            In 1 month Johnson, Barb Merino, DO [employer], [place]

## 2021-08-11 ENCOUNTER — Encounter: Payer: Self-pay | Admitting: Family Medicine

## 2021-08-16 ENCOUNTER — Ambulatory Visit
Admission: RE | Admit: 2021-08-16 | Discharge: 2021-08-16 | Disposition: A | Discharge status: Home / Self Care | Payer: Medicare Other | Source: Ambulatory Visit | Attending: Radiation Oncology | Admitting: Radiation Oncology

## 2021-08-16 ENCOUNTER — Other Ambulatory Visit: Payer: Self-pay

## 2021-08-16 ENCOUNTER — Encounter: Payer: Self-pay | Admitting: Radiation Oncology

## 2021-08-16 VITALS — BP 169/84 | HR 69 | Temp 97.2°F | Wt 307.1 lb

## 2021-08-16 DIAGNOSIS — Z923 Personal history of irradiation: Secondary | ICD-10-CM | POA: Diagnosis not present

## 2021-08-16 DIAGNOSIS — Z853 Personal history of malignant neoplasm of breast: Secondary | ICD-10-CM | POA: Diagnosis not present

## 2021-08-16 DIAGNOSIS — C50919 Malignant neoplasm of unspecified site of unspecified female breast: Secondary | ICD-10-CM

## 2021-08-16 NOTE — Progress Notes (Signed)
Radiation Oncology Follow up Note  Name: Wendy Marsh   Date:   08/16/2021 MRN:  [id number] DOB: [birth year]    This 65 y.o. 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  She specifically denies breast tenderness cough or bone pain.  She is not on antiestrogen therapy based on the triple negative nature of her disease..  She had mammograms back in August showing stable likely benign left breast mass at the 2 o'clock position also stable right breast status postlumpectomy.  COMPLICATIONS OF TREATMENT: none  FOLLOW UP COMPLIANCE: keeps appointments   PHYSICAL EXAM:  BP (!) 169/84   Pulse 69   Temp (!) 97.2 F (36.2 C) (Tympanic)   Wt (!) 307 lb 1.6 oz (139.3 kg)   LMP 03/17/2009 (Approximate)   BMI 56.17 kg/m  Lungs are clear to A&P cardiac examination essentially unremarkable with regular rate and rhythm. No dominant mass or nodularity is noted in either breast in 2 positions examined. Incision is well-healed. No axillary or supraclavicular adenopathy is appreciated. Cosmetic result is excellent.  Well-developed well-nourished patient in NAD. HEENT reveals PERLA, EOMI, discs not visualized.  Oral cavity is clear. No oral mucosal lesions are identified. Neck is clear without evidence of cervical or supraclavicular adenopathy. Lungs are clear to A&P. Cardiac examination is essentially unremarkable with regular rate and rhythm without murmur rub or thrill. Abdomen is benign with no organomegaly or masses noted. Motor sensory and DTR levels are equal and symmetric in the upper and  lower extremities. Cranial nerves II through XII are grossly intact. Proprioception is intact. No peripheral adenopathy or edema is identified. No motor or sensory levels are noted. Crude visual fields are within normal range.  RADIOLOGY RESULTS: Mammogram and ultrasound reviewed compatible with above-stated findings  PLAN: Present time patient is now 2-1/2 years out from whole breast radiation.  And pleased with her overall progress.  I have asked to see her back in 1 year for follow-up.  Patient knows to call with any concerns.  I would like to take this opportunity to thank you for allowing me to participate in the care of your patient.Noreene Filbert, MD

## 2021-09-02 ENCOUNTER — Encounter: Payer: Self-pay | Admitting: Family Medicine

## 2021-09-02 ENCOUNTER — Ambulatory Visit (INDEPENDENT_AMBULATORY_CARE_PROVIDER_SITE_OTHER): Payer: Medicare Other | Admitting: Family Medicine

## 2021-09-02 ENCOUNTER — Other Ambulatory Visit: Payer: Self-pay

## 2021-09-02 VITALS — BP 135/78 | HR 72 | Temp 98.5°F | Ht 61.6 in | Wt 301.0 lb

## 2021-09-02 DIAGNOSIS — I10 Essential (primary) hypertension: Secondary | ICD-10-CM

## 2021-09-02 DIAGNOSIS — E782 Mixed hyperlipidemia: Secondary | ICD-10-CM

## 2021-09-02 DIAGNOSIS — E039 Hypothyroidism, unspecified: Secondary | ICD-10-CM

## 2021-09-02 DIAGNOSIS — J1000 Influenza due to other identified influenza virus with unspecified type of pneumonia: Secondary | ICD-10-CM | POA: Diagnosis not present

## 2021-09-02 DIAGNOSIS — I480 Paroxysmal atrial fibrillation: Secondary | ICD-10-CM

## 2021-09-02 DIAGNOSIS — M109 Gout, unspecified: Secondary | ICD-10-CM

## 2021-09-02 LAB — MICROALBUMIN, URINE WAIVED
Creatinine, Urine Waived: 200 mg/dL (ref 10–300)
Microalb, Ur Waived: 30 mg/L — ABNORMAL HIGH (ref 0–19)
Microalb/Creat Ratio: <30 mg/g (ref <30)

## 2021-09-02 MED ORDER — BENZONATATE 200 MG PO CAPS: 200.0000 mg | ORAL_CAPSULE | Freq: Two times a day (BID) | ORAL | 0 refills | Status: DC | PRN

## 2021-09-02 MED ORDER — METOPROLOL SUCCINATE ER 50 MG PO TB24: ORAL_TABLET | ORAL | 1 refills | Status: DC

## 2021-09-02 MED ORDER — LISINOPRIL-HYDROCHLOROTHIAZIDE 20-25 MG PO TABS: 2.0000 | ORAL_TABLET | Freq: Every day | ORAL | 1 refills | Status: DC

## 2021-09-02 MED ORDER — EPINEPHRINE 0.3 MG/0.3ML IJ SOAJ: 0.3000 mg | INTRAMUSCULAR | 12 refills | Status: DC | PRN

## 2021-09-02 MED ORDER — AZITHROMYCIN 250 MG PO TABS: ORAL_TABLET | ORAL | 0 refills | Status: AC

## 2021-09-02 MED ORDER — ALLOPURINOL 300 MG PO TABS: 300.0000 mg | ORAL_TABLET | Freq: Every day | ORAL | 1 refills | Status: DC

## 2021-09-02 MED ORDER — PREDNISONE 50 MG PO TABS: 50.0000 mg | ORAL_TABLET | Freq: Every day | ORAL | 0 refills | Status: DC

## 2021-09-02 NOTE — Assessment & Plan Note (Signed)
Continue to follow with cardiology. Regular rhythm today. Call with any concerns. Continue to monitor.

## 2021-09-02 NOTE — Assessment & Plan Note (Signed)
Rechecking labs today. Await results. Treat as needed. Call with any concerns.  

## 2021-09-02 NOTE — Progress Notes (Signed)
BP 135/78 (BP Location: Left Arm, Cuff Size: Large)   Pulse 72   Temp 98.5 F (36.9 C) (Oral)   Ht 5' 1.6" (1.565 m)   Wt (!) 301 lb (136.5 kg)   LMP 03/17/2009 (Approximate)   SpO2 96%   BMI 55.77 kg/m    Subjective:    Patient ID: Wendy Marsh, female    DOB: 30-Oct-1955, 65 y.o.   MRN: [id number]  HPI: Wendy Marsh is a 65 y.o. female  Chief Complaint  Patient presents with   Hypertension   UPPER RESPIRATORY TRACT INFECTION Duration: Over the weekend Worst symptom: cough Fever: yes Cough: yes Shortness of breath: no Wheezing: no Chest pain: no Chest tightness: no Chest congestion: no Nasal congestion: yes Runny nose: yes Post nasal drip: yes Sneezing: no Sore throat: yes Swollen glands: no Sinus pressure: no Headache: no Face pain: no Toothache: no Ear pain: no  Ear pressure: no  Eyes red/itching:no Eye drainage/crusting: no  Vomiting: no Rash: no Fatigue: yes Sick contacts: yes Strep contacts: no  Context: better Recurrent sinusitis: no Relief with OTC cold/cough medications: no  Treatments attempted: cold/sinus, mucinex, anti-histamine, pseudoephedrine, cough syrup, and antibiotics   HYPERTENSION / HYPERLIPIDEMIA- held her medicine last night because her blood pressure was low.  Satisfied with current treatment? yes Duration of hypertension: chronic BP monitoring frequency: not checking BP medication side effects: no Past BP meds: lisinopril-hctz, metoprolol Duration of hyperlipidemia: chronic Cholesterol medication side effects: not on anything Cholesterol supplements: none Past cholesterol medications: none Medication compliance: excellent compliance Aspirin: no Recent stressors: no Recurrent headaches: no Visual changes: no Palpitations: no Dyspnea: no Chest pain: no Lower extremity edema: no Dizzy/lightheaded: no  Relevant past medical, surgical, family and social history reviewed and updated as indicated. Interim  medical history since our last visit reviewed. Allergies and medications reviewed and updated.  Review of Systems  Constitutional: Negative.   HENT:  Positive for congestion. Negative for dental problem.   Respiratory:  Positive for cough, shortness of breath and wheezing. Negative for apnea, choking, chest tightness and stridor.   Cardiovascular: Negative.   Gastrointestinal: Negative.   Musculoskeletal: Negative.   Neurological: Negative.   Psychiatric/Behavioral: Negative.     Per HPI unless specifically indicated above     Objective:    BP 135/78 (BP Location: Left Arm, Cuff Size: Large)   Pulse 72   Temp 98.5 F (36.9 C) (Oral)   Ht 5' 1.6" (1.565 m)   Wt (!) 301 lb (136.5 kg)   LMP 03/17/2009 (Approximate)   SpO2 96%   BMI 55.77 kg/m   Wt Readings from Last 3 Encounters:  09/02/21 (!) 301 lb (136.5 kg)  08/16/21 (!) 307 lb 1.6 oz (139.3 kg)  05/12/21 (!) 303 lb 4.8 oz (137.6 kg)    Physical Exam Vitals and nursing note reviewed.  Constitutional:      General: She is not in acute distress.    Appearance: Normal appearance. She is not ill-appearing, toxic-appearing or diaphoretic.  HENT:     Head: Normocephalic and atraumatic.     Right Ear: External ear normal.     Left Ear: External ear normal.     Nose: Nose normal.     Mouth/Throat:     Mouth: Mucous membranes are moist.     Pharynx: Oropharynx is clear.  Eyes:     General: No scleral icterus.       Right eye: No discharge.  Left eye: No discharge.     Extraocular Movements: Extraocular movements intact.     Conjunctiva/sclera: Conjunctivae normal.     Pupils: Pupils are equal, round, and reactive to light.  Cardiovascular:     Rate and Rhythm: Normal rate and regular rhythm.     Pulses: Normal pulses.     Heart sounds: Normal heart sounds. No murmur heard.   No friction rub. No gallop.  Pulmonary:     Effort: Pulmonary effort is normal. No respiratory distress.     Breath sounds: No stridor.  Wheezing present. No rhonchi or rales.  Chest:     Chest wall: No tenderness.  Musculoskeletal:        General: Normal range of motion.     Cervical back: Normal range of motion and neck supple.  Skin:    General: Skin is warm and dry.     Capillary Refill: Capillary refill takes less than 2 seconds.     Coloration: Skin is not jaundiced or pale.     Findings: No bruising, erythema, lesion or rash.  Neurological:     General: No focal deficit present.     Mental Status: She is alert and oriented to person, place, and time. Mental status is at baseline.  Psychiatric:        Mood and Affect: Mood normal.        Behavior: Behavior normal.        Thought Content: Thought content normal.        Judgment: Judgment normal.    Results for orders placed or performed in visit on 05/04/21  Cancer antigen 15-3  Result Value Ref Range   CA 15-3 10.0 0.0 - 25.0 U/mL  Cancer antigen 27.29  Result Value Ref Range   CA 27.29 6.6 0.0 - 38.6 U/mL  Comprehensive metabolic panel  Result Value Ref Range   Sodium 136 135 - 145 mmol/L   Potassium 3.4 (L) 3.5 - 5.1 mmol/L   Chloride 96 (L) 98 - 111 mmol/L   CO2 30 22 - 32 mmol/L   Glucose, Bld 101 (H) 70 - 99 mg/dL   BUN 19 8 - 23 mg/dL   Creatinine, Ser 0.91 0.44 - 1.00 mg/dL   Calcium 9.3 8.9 - 10.3 mg/dL   Total Protein 7.2 6.5 - 8.1 g/dL   Albumin 3.5 3.5 - 5.0 g/dL   AST 22 15 - 41 U/L   ALT 19 0 - 44 U/L   Alkaline Phosphatase 73 38 - 126 U/L   Total Bilirubin 0.2 (L) 0.3 - 1.2 mg/dL   GFR, Estimated >60 >60 mL/min   Anion gap 10 5 - 15  CBC with Differential/Platelet  Result Value Ref Range   WBC 10.8 (H) 4.0 - 10.5 K/uL   RBC 3.93 3.87 - 5.11 MIL/uL   Hemoglobin 11.2 (L) 12.0 - 15.0 g/dL   HCT 35.6 (L) 36.0 - 46.0 %   MCV 90.6 80.0 - 100.0 fL   MCH 28.5 26.0 - 34.0 pg   MCHC 31.5 30.0 - 36.0 g/dL   RDW 15.2 11.5 - 15.5 %   Platelets 311 150 - 400 K/uL   nRBC 0.0 0.0 - 0.2 %   Neutrophils Relative % 71 %   Neutro Abs 7.6 1.7  - 7.7 K/uL   Lymphocytes Relative 20 %   Lymphs Abs 2.1 0.7 - 4.0 K/uL   Monocytes Relative 6 %   Monocytes Absolute 0.7 0.1 - 1.0 K/uL   Eosinophils Relative  2 %   Eosinophils Absolute 0.3 0.0 - 0.5 K/uL   Basophils Relative 0 %   Basophils Absolute 0.0 0.0 - 0.1 K/uL   Immature Granulocytes 1 %   Abs Immature Granulocytes 0.06 0.00 - 0.07 K/uL      Assessment & Plan:   Problem List Items Addressed This Visit       Cardiovascular and Mediastinum   Paroxysmal atrial fibrillation ([place])    Continue to follow with cardiology. Regular rhythm today. Call with any concerns. Continue to monitor.       Relevant Medications   metoprolol succinate (TOPROL-XL) 50 MG 24 hr tablet   lisinopril-hydrochlorothiazide (ZESTORETIC) 20-25 MG tablet   EPINEPHrine 0.3 mg/0.3 mL IJ SOAJ injection   Other Relevant Orders   CBC with Differential/Platelet   Comprehensive metabolic panel   Benign essential hypertension    Under good control on current regimen. Continue current regimen. Continue to monitor. Call with any concerns. Refills given. Labs drawn today.       Relevant Medications   metoprolol succinate (TOPROL-XL) 50 MG 24 hr tablet   lisinopril-hydrochlorothiazide (ZESTORETIC) 20-25 MG tablet   EPINEPHrine 0.3 mg/0.3 mL IJ SOAJ injection   Other Relevant Orders   CBC with Differential/Platelet   Comprehensive metabolic panel   Microalbumin, Urine Waived     Endocrine   Thyroid activity decreased    Rechecking labs today. Await results. Treat as needed. Call with any concerns.       Relevant Medications   metoprolol succinate (TOPROL-XL) 50 MG 24 hr tablet   Other Relevant Orders   TSH     Musculoskeletal and Integument   Acute gout involving toe of right foot    Under good control on current regimen. Continue current regimen. Continue to monitor. Call with any concerns. Refills given. Labs drawn today.       Relevant Medications   predniSONE (DELTASONE) 50 MG tablet    allopurinol (ZYLOPRIM) 300 MG tablet   Other Relevant Orders   CBC with Differential/Platelet   Comprehensive metabolic panel   Uric acid     Other   Combined fat and carbohydrate induced hyperlipemia    Rechecking labs today. Await results. Treat as needed.       Relevant Medications   metoprolol succinate (TOPROL-XL) 50 MG 24 hr tablet   lisinopril-hydrochlorothiazide (ZESTORETIC) 20-25 MG tablet   EPINEPHrine 0.3 mg/0.3 mL IJ SOAJ injection   Other Relevant Orders   CBC with Differential/Platelet   Lipid Panel w/o Chol/HDL Ratio   Comprehensive metabolic panel   Other Visit Diagnoses     Pneumonia due to influenza A virus    -  Primary   Will treat with prednisone, azithromycin and tessalon. Call if not getting better or getting worse. Continue to monitor.    Relevant Medications   azithromycin (ZITHROMAX) 250 MG tablet   benzonatate (TESSALON) 200 MG capsule        Follow up plan: Return in about 6 months (around 03/03/2022).

## 2021-09-02 NOTE — Assessment & Plan Note (Signed)
Under good control on current regimen. Continue current regimen. Continue to monitor. Call with any concerns. Refills given. Labs drawn today.   

## 2021-09-02 NOTE — Assessment & Plan Note (Signed)
Rechecking labs today. Await results. Treat as needed.  °

## 2021-09-03 LAB — COMPREHENSIVE METABOLIC PANEL
ALT: 22 IU/L (ref 0–32)
AST: 23 IU/L (ref 0–40)
Albumin/Globulin Ratio: 1.4 (ref 1.2–2.2)
Albumin: 4.2 g/dL (ref 3.8–4.8)
Alkaline Phosphatase: 94 IU/L (ref 44–121)
BUN/Creatinine Ratio: 15 (ref 12–28)
BUN: 15 mg/dL (ref 8–27)
Bilirubin Total: <0.2 mg/dL (ref 0.0–1.2)
CO2: 26 mmol/L (ref 20–29)
Calcium: 8.9 mg/dL (ref 8.7–10.3)
Chloride: 97 mmol/L (ref 96–106)
Creatinine, Ser: 0.99 mg/dL (ref 0.57–1.00)
Globulin, Total: 3 g/dL (ref 1.5–4.5)
Glucose: 88 mg/dL (ref 70–99)
Potassium: 3.7 mmol/L (ref 3.5–5.2)
Sodium: 141 mmol/L (ref 134–144)
Total Protein: 7.2 g/dL (ref 6.0–8.5)
eGFR: 63 mL/min/{1.73_m2} (ref >59)

## 2021-09-03 LAB — LIPID PANEL W/O CHOL/HDL RATIO
Cholesterol, Total: 232 mg/dL — ABNORMAL HIGH (ref 100–199)
HDL: 55 mg/dL (ref >39)
LDL Chol Calc (NIH): 154 mg/dL — ABNORMAL HIGH (ref 0–99)
Triglycerides: 127 mg/dL (ref 0–149)
VLDL Cholesterol Cal: 23 mg/dL (ref 5–40)

## 2021-09-03 LAB — CBC WITH DIFFERENTIAL/PLATELET
Basophils Absolute: 0 10*3/uL (ref 0.0–0.2)
Basos: 0 %
EOS (ABSOLUTE): 0.3 10*3/uL (ref 0.0–0.4)
Eos: 5 %
Hematocrit: 37.8 % (ref 34.0–46.6)
Hemoglobin: 12.4 g/dL (ref 11.1–15.9)
Immature Grans (Abs): 0 10*3/uL (ref 0.0–0.1)
Immature Granulocytes: 0 %
Lymphocytes Absolute: 1.5 10*3/uL (ref 0.7–3.1)
Lymphs: 23 %
MCH: 28.2 pg (ref 26.6–33.0)
MCHC: 32.8 g/dL (ref 31.5–35.7)
MCV: 86 fL (ref 79–97)
Monocytes Absolute: 0.7 10*3/uL (ref 0.1–0.9)
Monocytes: 11 %
Neutrophils Absolute: 3.9 10*3/uL (ref 1.4–7.0)
Neutrophils: 61 %
Platelets: 285 10*3/uL (ref 150–450)
RBC: 4.4 x10E6/uL (ref 3.77–5.28)
RDW: 14 % (ref 11.7–15.4)
WBC: 6.4 10*3/uL (ref 3.4–10.8)

## 2021-09-03 LAB — URIC ACID: Uric Acid: 6.2 mg/dL (ref 3.0–7.2)

## 2021-09-03 LAB — TSH: TSH: 1.19 u[IU]/mL (ref 0.450–4.500)

## 2021-09-07 ENCOUNTER — Other Ambulatory Visit: Payer: Self-pay | Admitting: Family Medicine

## 2021-09-07 MED ORDER — LEVOTHYROXINE SODIUM 175 MCG PO TABS
175.0000 ug | ORAL_TABLET | Freq: Every day | ORAL | 3 refills | Status: DC
Start: 2021-09-07 — End: 2022-03-04

## 2021-11-12 ENCOUNTER — Other Ambulatory Visit: Payer: Self-pay

## 2021-11-12 ENCOUNTER — Inpatient Hospital Stay: Payer: Medicare Other | Attending: Oncology

## 2021-11-12 DIAGNOSIS — C50919 Malignant neoplasm of unspecified site of unspecified female breast: Secondary | ICD-10-CM

## 2021-11-12 DIAGNOSIS — Z171 Estrogen receptor negative status [ER-]: Secondary | ICD-10-CM | POA: Insufficient documentation

## 2021-11-12 DIAGNOSIS — Z79899 Other long term (current) drug therapy: Secondary | ICD-10-CM | POA: Insufficient documentation

## 2021-11-12 DIAGNOSIS — C50511 Malignant neoplasm of lower-outer quadrant of right female breast: Secondary | ICD-10-CM | POA: Insufficient documentation

## 2021-11-12 LAB — CBC WITH DIFFERENTIAL/PLATELET
Abs Immature Granulocytes: 0.04 10*3/uL (ref 0.00–0.07)
Basophils Absolute: 0 10*3/uL (ref 0.0–0.1)
Basophils Relative: 0 %
Eosinophils Absolute: 0.2 10*3/uL (ref 0.0–0.5)
Eosinophils Relative: 3 %
HCT: 38 % (ref 36.0–46.0)
Hemoglobin: 12.1 g/dL (ref 12.0–15.0)
Immature Granulocytes: 0 %
Lymphocytes Relative: 19 %
Lymphs Abs: 1.7 10*3/uL (ref 0.7–4.0)
MCH: 28.9 pg (ref 26.0–34.0)
MCHC: 31.8 g/dL (ref 30.0–36.0)
MCV: 90.7 fL (ref 80.0–100.0)
Monocytes Absolute: 0.6 10*3/uL (ref 0.1–1.0)
Monocytes Relative: 7 %
Neutro Abs: 6.6 10*3/uL (ref 1.7–7.7)
Neutrophils Relative %: 71 %
Platelets: 305 10*3/uL (ref 150–400)
RBC: 4.19 MIL/uL (ref 3.87–5.11)
RDW: 15.8 % — ABNORMAL HIGH (ref 11.5–15.5)
WBC: 9.2 10*3/uL (ref 4.0–10.5)
nRBC: 0 % (ref 0.0–0.2)

## 2021-11-12 LAB — COMPREHENSIVE METABOLIC PANEL
ALT: 17 U/L (ref 0–44)
AST: 22 U/L (ref 15–41)
Albumin: 3.7 g/dL (ref 3.5–5.0)
Alkaline Phosphatase: 86 U/L (ref 38–126)
Anion gap: 11 (ref 5–15)
BUN: 16 mg/dL (ref 8–23)
CO2: 30 mmol/L (ref 22–32)
Calcium: 9.5 mg/dL (ref 8.9–10.3)
Chloride: 97 mmol/L — ABNORMAL LOW (ref 98–111)
Creatinine, Ser: 0.95 mg/dL (ref 0.44–1.00)
GFR, Estimated: >60 mL/min (ref >60)
Glucose, Bld: 110 mg/dL — ABNORMAL HIGH (ref 70–99)
Potassium: 3.5 mmol/L (ref 3.5–5.1)
Sodium: 138 mmol/L (ref 135–145)
Total Bilirubin: 0.3 mg/dL (ref 0.3–1.2)
Total Protein: 7.5 g/dL (ref 6.5–8.1)

## 2021-11-13 LAB — CANCER ANTIGEN 15-3: CA 15-3: 12 U/mL (ref 0.0–25.0)

## 2021-11-13 LAB — CANCER ANTIGEN 27.29: CA 27.29: 7.6 U/mL (ref 0.0–38.6)

## 2021-11-15 ENCOUNTER — Encounter: Payer: Self-pay | Admitting: Oncology

## 2021-11-15 ENCOUNTER — Inpatient Hospital Stay (HOSPITAL_BASED_OUTPATIENT_CLINIC_OR_DEPARTMENT_OTHER): Payer: Medicare Other | Admitting: Oncology

## 2021-11-15 ENCOUNTER — Other Ambulatory Visit: Payer: Self-pay

## 2021-11-15 VITALS — BP 157/65 | HR 70 | Wt 310.2 lb

## 2021-11-15 DIAGNOSIS — E876 Hypokalemia: Secondary | ICD-10-CM | POA: Diagnosis not present

## 2021-11-15 DIAGNOSIS — Z7901 Long term (current) use of anticoagulants: Secondary | ICD-10-CM | POA: Diagnosis not present

## 2021-11-15 DIAGNOSIS — C50511 Malignant neoplasm of lower-outer quadrant of right female breast: Secondary | ICD-10-CM | POA: Diagnosis not present

## 2021-11-15 DIAGNOSIS — C50919 Malignant neoplasm of unspecified site of unspecified female breast: Secondary | ICD-10-CM | POA: Diagnosis not present

## 2021-11-15 NOTE — Progress Notes (Signed)
Hematology/Oncology Progress note [phone] [phone]      Patient Care Team: Valerie Roys, DO as PCP - General (Family Medicine) Thornton Park, MD as Referring Physician (Orthopedic Surgery) Rico Junker, RN as Oncology Nurse Navigator Byrnett, Forest Gleason, MD (General Surgery) Earlie Server, MD as Medical Oncologist (Medical Oncology) Noreene Filbert, MD as Referring Physician (Radiation Oncology)  REASON FOR VISIT Follow up for treatment of Breast cancer, assess of chemotherapy toxicity.  HISTORY OF PRESENTING ILLNESS:  Wendy Marsh is a  66 y.o.  female with PMH listed below who was referred to me for evaluation of breast cancer  04/28/2019 diagnostic mammogram Mammogram showed right breast 6 o'clock axis breast mass, 2.9 cm corresponding to area of concern.  cT2N0  Breast mass was biopsied, and pathology showed invasive mammary carcinoma.  Grade 3 , ER/PR- HER2 -.  LVI suspicious, favor present.   #  baseline MUGA testing done and reviewed systolic function depression, LVEF 45%. Given that Adriamycin has cardiac toxicity, potentially can lead to irreversible cardiomyopathy, I discussed with patient about using non-anthracycline-containing regimen. # 05/23/2019- 09/11/2018  S/p 6 cycle of Docetaxel 75mg /m2 and Carboplatin every 3 weeks [ data from [place] et al 2018 Clin Cancer Res, Pathological Response and Survival in Triple-Negative Breast Cancer Following Neoadjuvant Carboplatin plus Docetaxel.Stage I-III TNBC patients, Neoadjuvant carboplatin (AUC6) plus docetaxel (75 mg/m2) every 21 days  6 cycles. pCR was 55% and Residual cancer burden (RCB) were 13%. Excellent Three-year RFS was 90% in patients with pCR and 66% in those without pCR]    # 10/29/2018   right breast lumpectomy with  excisional biopsy of the right axillary deep sentinel lymph nodes. Pathology showed CR  ypT0, ypN0- complete pathological remission.   # #  Family history of breast cancer plus personal history of triple negative breast cancer. Testing did not reveal a pathogenic mutation in any of the genes analyzed.   BRCA negative.  VUS was detected. Does not change management.   #Mediport was removed. INTERVAL HISTORY Wendy Marsh is a 66 y.o. female who has above history reviewed by me today presents for discussion of pathology and further management plan for triple negative breast cancer. + right heel pain Recently had back pain which improved after a few days.   .. Review of Systems  Constitutional:  Negative for chills, fever, malaise/fatigue and weight loss.  HENT:  Negative for nosebleeds and sore throat.   Eyes:  Negative for double vision, photophobia and redness.  Respiratory:  Negative for cough, shortness of breath and wheezing.   Cardiovascular:  Negative for chest pain, palpitations, orthopnea and leg swelling.  Gastrointestinal:  Negative for abdominal pain, blood in stool, nausea and vomiting.  Genitourinary:  Negative for dysuria.  Musculoskeletal:  Positive for back pain and joint pain. Negative for myalgias and neck pain.       Chronic right knee pain.  Skin:  Negative for itching and rash.  Neurological:  Negative for dizziness, tingling and tremors.  Endo/Heme/Allergies:  Negative for environmental allergies. Does not bruise/bleed easily.  Psychiatric/Behavioral:  Negative for depression and hallucinations.    MEDICAL HISTORY:  Past Medical History:  Diagnosis Date   Arthritis    Atrial fibrillation ([place])    Breast cancer ([name]) 04/2018   right breast   Dysrhythmia    afib   Hyperlipidemia    Hypertension    Hypotension due to drugs 05/30/2018   Hypothyroidism    Lumbago  Osteoporosis    Personal history of chemotherapy    Personal history of radiation therapy    Thyroid disease     SURGICAL HISTORY: Past Surgical History:  Procedure Laterality Date   BREAST BIOPSY Right 05/02/2018    Invasive mammary carcinoma, grade 3, neo adj chemo   BREAST LUMPECTOMY Right 10/29/2018   NO RESIDUAL MALIGNANCY IDENTIFIED.    BREAST LUMPECTOMY WITH NEEDLE LOCALIZATION AND AXILLARY SENTINEL LYMPH NODE BX Right 10/29/2018   Procedure: BREAST LUMPECTOMY WITH NEEDLE LOCALIZATION AND SENTINEL LYMPH NODE BX;  Surgeon: Vickie Epley, MD;  Location: [facility] ORS;  Service: General;  Laterality: Right;   JOINT REPLACEMENT Left    tkr   PORTACATH PLACEMENT Right 05/11/2018   Procedure: INSERTION  I.J PORT-A-CATH;  Surgeon: Robert Bellow, MD;  Location: [facility] ORS;  Service: General;  Laterality: Right;   REPLACEMENT TOTAL KNEE      SOCIAL HISTORY: Social History   Socioeconomic History   Marital status: Married    Spouse name: jerry   Number of children: 3   Years of education: Not on file   Highest education level: Some college, no degree  Occupational History   Not on file  Tobacco Use   Smoking status: Never   Smokeless tobacco: Never  Vaping Use   Vaping Use: Never used  Substance and Sexual Activity   Alcohol use: No   Drug use: No   Sexual activity: Yes    Birth control/protection: Post-menopausal  Other Topics Concern   Not on file  Social History Narrative   Not on file   Social Determinants of Health   Financial Resource Strain: Low Risk    Difficulty of Paying Living Expenses: Not hard at all  Food Insecurity: No Food Insecurity   Worried About Charity fundraiser in the Last Year: Never true   [place] in the Last Year: Never true  Transportation Needs: No Transportation Needs   Lack of Transportation (Medical): No   Lack of Transportation (Non-Medical): No  Physical Activity: Inactive   Days of Exercise per Week: 0 days   Minutes of Exercise per Session: 0 min  Stress: No Stress Concern Present   Feeling of Stress : Not at all  Social Connections: Moderately Isolated   Frequency of Communication with Friends and Family: More than three times a week    Frequency of Social Gatherings with Friends and Family: Twice a week   Attends Religious Services: Never   Marine scientist or Organizations: No   Attends Music therapist: Never   Marital Status: Married  Human resources officer Violence: Not At Risk   Fear of Current or Ex-Partner: No   Emotionally Abused: No   Physically Abused: No   Sexually Abused: No    FAMILY HISTORY: Family History  Problem Relation Age of Onset   Diabetes Mother    Congestive Heart Failure Mother    Breast cancer Maternal Grandmother        dx 33s; deceased 63s   Cancer Maternal Uncle 8       deceased 68s   Other Father        No information on father or paternal relatives   Breast cancer Other 52       paternal half-sister; also esophageal ca at 65; currently 66   Diabetes Other    Hypertension Other    Kidney disease Other    Thyroid disease Other     ALLERGIES:  is allergic to gabapentin, morphine and related, peanut-containing drug products, and tape.  MEDICATIONS:  Current Outpatient Medications  Medication Sig Dispense Refill   acetaminophen (TYLENOL) 500 MG tablet Take 500 mg by mouth every 6 (six) hours as needed.     apixaban (ELIQUIS) 5 MG TABS tablet Take 5 mg by mouth in the morning and at bedtime.     EPINEPHrine 0.3 mg/0.3 mL IJ SOAJ injection Inject 0.3 mg into the muscle as needed for anaphylaxis. 1 each 12   flecainide (TAMBOCOR) 100 MG tablet Take 100 mg by mouth 2 (two) times daily.     levothyroxine (SYNTHROID) 175 MCG tablet Take 1 tablet (175 mcg total) by mouth daily before breakfast. 90 tablet 3   metoprolol succinate (TOPROL-XL) 50 MG 24 hr tablet TAKE 1 TABLET BY MOUTH ONCE DAILY WITH  OR  IMMEDIATELY  FOLLOWING  A  MEAL 90 tablet 1   allopurinol (ZYLOPRIM) 300 MG tablet Take 1 tablet (300 mg total) by mouth daily. 90 tablet 1   benzonatate (TESSALON) 200 MG capsule Take 1 capsule (200 mg total) by mouth 2 (two) times daily as needed for cough. 20 capsule 0    lisinopril-hydrochlorothiazide (ZESTORETIC) 20-25 MG tablet Take 2 tablets by mouth daily. 180 tablet 1   predniSONE (DELTASONE) 50 MG tablet Take 1 tablet (50 mg total) by mouth daily with breakfast. 5 tablet 0   No current facility-administered medications for this visit.     PHYSICAL EXAMINATION: ECOG PERFORMANCE STATUS: 1 - Symptomatic but completely ambulatory Vitals:   11/15/21 1024  BP: (!) 157/65  Pulse: 70   Filed Weights   11/15/21 1024  Weight: (!) 310 lb 3.2 oz (140.7 kg)    Physical Exam Constitutional:      General: She is not in acute distress.    Appearance: She is obese.     Comments: Obese, walks with a walker  HENT:     Head: Normocephalic and atraumatic.  Eyes:     General: No scleral icterus.    Pupils: Pupils are equal, round, and reactive to light.  Cardiovascular:     Rate and Rhythm: Normal rate and regular rhythm.     Heart sounds: Normal heart sounds.  Pulmonary:     Effort: Pulmonary effort is normal. No respiratory distress.     Breath sounds: No wheezing.  Abdominal:     General: Bowel sounds are normal. There is no distension.     Palpations: Abdomen is soft. There is no mass.     Tenderness: There is no abdominal tenderness.  Musculoskeletal:        General: No deformity. Normal range of motion.     Cervical back: Normal range of motion and neck supple.     Comments: Chronic lower extremity edema.  1+  Skin:    General: Skin is warm and dry.     Findings: No erythema or rash.  Neurological:     Mental Status: She is alert and oriented to person, place, and time. Mental status is at baseline.     Cranial Nerves: No cranial nerve deficit.     Coordination: Coordination normal.  Psychiatric:        Mood and Affect: Mood normal.      LABORATORY DATA:  I have reviewed the data as listed Lab Results  Component Value Date   WBC 9.2 11/12/2021   HGB 12.1 11/12/2021   HCT 38.0 11/12/2021   MCV 90.7 11/12/2021   PLT 305  11/12/2021   Recent Labs    05/04/21 0848 09/02/21 1028 11/12/21 1120  NA 136 141 138  K 3.4* 3.7 3.5  CL 96* 97 97*  CO2 $Re'30 26 30  'RiQ$ GLUCOSE 101* 88 110*  BUN $Re'19 15 16  'TrU$ CREATININE 0.91 0.99 0.95  CALCIUM 9.3 8.9 9.5  GFRNONAA >60  --  >60  PROT 7.2 7.2 7.5  ALBUMIN 3.5 4.2 3.7  AST $Re'22 23 22  'ppu$ ALT $R'19 22 17  'CE$ ALKPHOS 73 94 86  BILITOT 0.2* <0.2 0.3    RADIOGRAPHIC STUDIES: I have personally reviewed the radiological images as listed and agreed with the findings in the report. NM Cardiac Muga rest: Calculated LEFT ventricular ejection fraction equals 45%   ASSESSMENT & PLAN:  1. 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 Hypokalemia   3. 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 .  Status post right lumpectomy with excisional biopsy of sentinel lymph node. S/p radiation.  Complete pathological remission,  ypT0 ypN0 Clinically patient is doing very well.  No signs of disease recurrence. Labs were reviewed and discussed with patient.  Tumor markers are stable and normal. Obtain bilateral diagnostic mammogram and left ultrasound in August 2023  #A-fib, on anticoagulation with Eliquis.  Hemoglobin stable. #Right heel pain, likely musculoskeletal.  Monitor symptoms.  Orders Placed This Encounter  Procedures   US BREAST LTD UNI LEFT INC AXILLA    Standing Status:   Future    Standing Expiration Date:   11/15/2022    Order Specific Question:   Reason for Exam (SYMPTOM  OR DIAGNOSIS  REQUIRED)    Answer:   history of breast cancer    Order Specific Question:   Preferred imaging location?    Answer:   [facility]   MM DIAG BREAST TOMO BILATERAL    Standing Status:   Future    Standing Expiration Date:   11/15/2022    Order Specific Question:   Reason for Exam (SYMPTOM  OR DIAGNOSIS REQUIRED)    Answer:   history of breast cancer    Order Specific Question:   Preferred imaging location?    Answer:   [facility]   Comprehensive metabolic panel    Standing Status:   Future    Standing Expiration Date:   11/15/2022   CBC with Differential/Platelet    Standing Status:   Future    Standing Expiration Date:   11/15/2022   Cancer antigen 15-3    Standing Status:   Future    Standing Expiration Date:   11/15/2022   Cancer antigen 27.29    Standing Status:   Future    Standing Expiration Date:   11/15/2022    Return of visit: 6 months with repeat CBC and CMP, CA 15.3, CA 27.29,  Earlie Server, MD, PhD 11/15/2021

## 2022-03-03 ENCOUNTER — Ambulatory Visit (INDEPENDENT_AMBULATORY_CARE_PROVIDER_SITE_OTHER): Payer: Medicare Other | Admitting: Family Medicine

## 2022-03-03 ENCOUNTER — Encounter: Payer: Self-pay | Admitting: Family Medicine

## 2022-03-03 VITALS — BP 130/68 | HR 64 | Temp 98.2°F | Wt 311.0 lb

## 2022-03-03 DIAGNOSIS — E039 Hypothyroidism, unspecified: Secondary | ICD-10-CM

## 2022-03-03 DIAGNOSIS — M109 Gout, unspecified: Secondary | ICD-10-CM

## 2022-03-03 DIAGNOSIS — E782 Mixed hyperlipidemia: Secondary | ICD-10-CM | POA: Diagnosis not present

## 2022-03-03 DIAGNOSIS — Z1211 Encounter for screening for malignant neoplasm of colon: Secondary | ICD-10-CM

## 2022-03-03 DIAGNOSIS — I10 Essential (primary) hypertension: Secondary | ICD-10-CM

## 2022-03-03 DIAGNOSIS — I480 Paroxysmal atrial fibrillation: Secondary | ICD-10-CM

## 2022-03-03 MED ORDER — METOPROLOL SUCCINATE ER 50 MG PO TB24: ORAL_TABLET | ORAL | 1 refills | Status: DC

## 2022-03-03 MED ORDER — LISINOPRIL-HYDROCHLOROTHIAZIDE 20-25 MG PO TABS: 2.0000 | ORAL_TABLET | Freq: Every day | ORAL | 1 refills | Status: DC

## 2022-03-03 MED ORDER — ALLOPURINOL 300 MG PO TABS: 300.0000 mg | ORAL_TABLET | Freq: Every day | ORAL | 1 refills | Status: DC

## 2022-03-03 NOTE — Assessment & Plan Note (Signed)
Encouraged diet and exercise with goal of losing 1-2lbs per week. Call with any concerns. Continue to monitor.  

## 2022-03-03 NOTE — Progress Notes (Signed)
BP 130/68   Pulse 64   Temp 98.2 F (36.8 C)   Wt (!) 311 lb (141.1 kg)   LMP 03/17/2009 (Approximate)   SpO2 96%   BMI 57.62 kg/m    Subjective:    Patient ID: Wendy Marsh, female    DOB: December 14, 1955, 66 y.o.   MRN: [id number]  HPI: Wendy Marsh is a 66 y.o. female  Chief Complaint  Patient presents with   Hypertension   Hypothyroidism   HYPERTENSION / [place] Satisfied with current treatment? yes Duration of hypertension: chronic BP monitoring frequency: not checking BP medication side effects: no Past BP meds: lisinopril-HCTZ, metoprolol Duration of hyperlipidemia: chronic Cholesterol medication side effects: not on anything Cholesterol supplements: none Past cholesterol medications: none Medication compliance: excellent compliance Aspirin: no Recent stressors: no Recurrent headaches: no Visual changes: no Palpitations: no Dyspnea: no Chest pain: no Lower extremity edema: no Dizzy/lightheaded: no  HYPOTHYROIDISM Thyroid control status:controlled Satisfied with current treatment? yes Medication side effects: no Medication compliance: excellent compliance Recent dose adjustment:no Fatigue: no Cold intolerance: no Heat intolerance: no Weight gain: no Weight loss: no Constipation: no Diarrhea/loose stools: no Palpitations: no Lower extremity edema: no Anxiety/depressed mood: no  No gout flares. Feeling good. Tolerating medicine well.   Relevant past medical, surgical, family and social history reviewed and updated as indicated. Interim medical history since our last visit reviewed. Allergies and medications reviewed and updated.  Review of Systems  Constitutional: Negative.   Respiratory: Negative.    Cardiovascular: Negative.   Gastrointestinal: Negative.   Musculoskeletal: Negative.   Neurological: Negative.   Psychiatric/Behavioral: Negative.     Per HPI unless specifically indicated above     Objective:    BP  130/68   Pulse 64   Temp 98.2 F (36.8 C)   Wt (!) 311 lb (141.1 kg)   LMP 03/17/2009 (Approximate)   SpO2 96%   BMI 57.62 kg/m   Wt Readings from Last 3 Encounters:  03/03/22 (!) 311 lb (141.1 kg)  11/15/21 (!) 310 lb 3.2 oz (140.7 kg)  09/02/21 (!) 301 lb (136.5 kg)    Physical Exam Vitals and nursing note reviewed.  Constitutional:      General: She is not in acute distress.    Appearance: Normal appearance. She is obese. She is not ill-appearing, toxic-appearing or diaphoretic.  HENT:     Head: Normocephalic and atraumatic.     Right Ear: External ear normal.     Left Ear: External ear normal.     Nose: Nose normal.     Mouth/Throat:     Mouth: Mucous membranes are moist.     Pharynx: Oropharynx is clear.  Eyes:     General: No scleral icterus.       Right eye: No discharge.        Left eye: No discharge.     Extraocular Movements: Extraocular movements intact.     Conjunctiva/sclera: Conjunctivae normal.     Pupils: Pupils are equal, round, and reactive to light.  Cardiovascular:     Rate and Rhythm: Normal rate and regular rhythm.     Pulses: Normal pulses.     Heart sounds: Normal heart sounds. No murmur heard.   No friction rub. No gallop.  Pulmonary:     Effort: Pulmonary effort is normal. No respiratory distress.     Breath sounds: Normal breath sounds. No stridor. No wheezing, rhonchi or rales.  Chest:     Chest wall: No tenderness.  Musculoskeletal:        General: Normal range of motion.     Cervical back: Normal range of motion and neck supple.  Skin:    General: Skin is warm and dry.     Capillary Refill: Capillary refill takes less than 2 seconds.     Coloration: Skin is not jaundiced or pale.     Findings: No bruising, erythema, lesion or rash.  Neurological:     General: No focal deficit present.     Mental Status: She is alert and oriented to person, place, and time. Mental status is at baseline.  Psychiatric:        Mood and Affect: Mood  normal.        Behavior: Behavior normal.        Thought Content: Thought content normal.        Judgment: Judgment normal.    Results for orders placed or performed in visit on 11/12/21  Cancer antigen 27.29  Result Value Ref Range   CA 27.29 7.6 0.0 - 38.6 U/mL  Cancer antigen 15-3  Result Value Ref Range   CA 15-3 12.0 0.0 - 25.0 U/mL  Comprehensive metabolic panel  Result Value Ref Range   Sodium 138 135 - 145 mmol/L   Potassium 3.5 3.5 - 5.1 mmol/L   Chloride 97 (L) 98 - 111 mmol/L   CO2 30 22 - 32 mmol/L   Glucose, Bld 110 (H) 70 - 99 mg/dL   BUN 16 8 - 23 mg/dL   Creatinine, Ser 0.95 0.44 - 1.00 mg/dL   Calcium 9.5 8.9 - 10.3 mg/dL   Total Protein 7.5 6.5 - 8.1 g/dL   Albumin 3.7 3.5 - 5.0 g/dL   AST 22 15 - 41 U/L   ALT 17 0 - 44 U/L   Alkaline Phosphatase 86 38 - 126 U/L   Total Bilirubin 0.3 0.3 - 1.2 mg/dL   GFR, Estimated >60 >60 mL/min   Anion gap 11 5 - 15  CBC with Differential/Platelet  Result Value Ref Range   WBC 9.2 4.0 - 10.5 K/uL   RBC 4.19 3.87 - 5.11 MIL/uL   Hemoglobin 12.1 12.0 - 15.0 g/dL   HCT 38.0 36.0 - 46.0 %   MCV 90.7 80.0 - 100.0 fL   MCH 28.9 26.0 - 34.0 pg   MCHC 31.8 30.0 - 36.0 g/dL   RDW 15.8 (H) 11.5 - 15.5 %   Platelets 305 150 - 400 K/uL   nRBC 0.0 0.0 - 0.2 %   Neutrophils Relative % 71 %   Neutro Abs 6.6 1.7 - 7.7 K/uL   Lymphocytes Relative 19 %   Lymphs Abs 1.7 0.7 - 4.0 K/uL   Monocytes Relative 7 %   Monocytes Absolute 0.6 0.1 - 1.0 K/uL   Eosinophils Relative 3 %   Eosinophils Absolute 0.2 0.0 - 0.5 K/uL   Basophils Relative 0 %   Basophils Absolute 0.0 0.0 - 0.1 K/uL   Immature Granulocytes 0 %   Abs Immature Granulocytes 0.04 0.00 - 0.07 K/uL      Assessment & Plan:   Problem List Items Addressed This Visit       Cardiovascular and Mediastinum   Paroxysmal atrial fibrillation (HCC)    In NSR today. Continue to follow with cardiology. Call with any concerns. Continue to monitor.        Relevant  Medications   lisinopril-hydrochlorothiazide (ZESTORETIC) 20-25 MG tablet   metoprolol succinate (TOPROL-XL) 50 MG 24 hr  tablet   Other Relevant Orders   Comprehensive metabolic panel   CBC with Differential/Platelet   Benign essential hypertension - Primary    Under good control on current regimen. Continue current regimen. Continue to monitor. Call with any concerns. Refills given. Labs drawn today.        Relevant Medications   lisinopril-hydrochlorothiazide (ZESTORETIC) 20-25 MG tablet   metoprolol succinate (TOPROL-XL) 50 MG 24 hr tablet   Other Relevant Orders   Comprehensive metabolic panel     Endocrine   Thyroid activity decreased    Rechecking labs today. Await results. Treat as needed.        Relevant Medications   metoprolol succinate (TOPROL-XL) 50 MG 24 hr tablet   Other Relevant Orders   Comprehensive metabolic panel   TSH     Musculoskeletal and Integument   Acute gout involving toe of right foot    Under good control on current regimen. Continue current regimen. Continue to monitor. Call with any concerns. Refills given. Labs drawn today.         Relevant Medications   allopurinol (ZYLOPRIM) 300 MG tablet   Other Relevant Orders   Comprehensive metabolic panel   Uric acid     Other   Combined fat and carbohydrate induced hyperlipemia    Under good control on current regimen. Continue current regimen. Continue to monitor. Call with any concerns. Refills given. Labs drawn today.        Relevant Medications   lisinopril-hydrochlorothiazide (ZESTORETIC) 20-25 MG tablet   metoprolol succinate (TOPROL-XL) 50 MG 24 hr tablet   Other Relevant Orders   Comprehensive metabolic panel   Lipid Panel w/o Chol/HDL Ratio   Morbid obesity (HCC)    Encouraged diet and exercise with goal of losing 1-2lbs per week. Call with any concerns. Continue to monitor.        Other Visit Diagnoses     Screening for colon cancer       Cologuard ordered today.    Relevant Orders   Cologuard        Follow up plan: Return in about 6 months (around 09/02/2022).

## 2022-03-03 NOTE — Assessment & Plan Note (Signed)
Rechecking labs today. Await results. Treat as needed.  °

## 2022-03-03 NOTE — Assessment & Plan Note (Signed)
Under good control on current regimen. Continue current regimen. Continue to monitor. Call with any concerns. Refills given. Labs drawn today.   

## 2022-03-03 NOTE — Assessment & Plan Note (Signed)
In NSR today. Continue to follow with cardiology. Call with any concerns. Continue to monitor.

## 2022-03-04 ENCOUNTER — Other Ambulatory Visit: Payer: Self-pay | Admitting: Family Medicine

## 2022-03-04 DIAGNOSIS — E039 Hypothyroidism, unspecified: Secondary | ICD-10-CM

## 2022-03-04 LAB — LIPID PANEL W/O CHOL/HDL RATIO
Cholesterol, Total: 240 mg/dL — ABNORMAL HIGH (ref 100–199)
HDL: 51 mg/dL (ref >39)
LDL Chol Calc (NIH): 166 mg/dL — ABNORMAL HIGH (ref 0–99)
Triglycerides: 129 mg/dL (ref 0–149)
VLDL Cholesterol Cal: 23 mg/dL (ref 5–40)

## 2022-03-04 LAB — COMPREHENSIVE METABOLIC PANEL
ALT: 24 IU/L (ref 0–32)
AST: 18 IU/L (ref 0–40)
Albumin/Globulin Ratio: 1.4 (ref 1.2–2.2)
Albumin: 3.9 g/dL (ref 3.8–4.8)
Alkaline Phosphatase: 101 IU/L (ref 44–121)
BUN/Creatinine Ratio: 22 (ref 12–28)
BUN: 19 mg/dL (ref 8–27)
Bilirubin Total: 0.2 mg/dL (ref 0.0–1.2)
CO2: 28 mmol/L (ref 20–29)
Calcium: 9.1 mg/dL (ref 8.7–10.3)
Chloride: 98 mmol/L (ref 96–106)
Creatinine, Ser: 0.88 mg/dL (ref 0.57–1.00)
Globulin, Total: 2.7 g/dL (ref 1.5–4.5)
Glucose: 82 mg/dL (ref 70–99)
Potassium: 3.9 mmol/L (ref 3.5–5.2)
Sodium: 141 mmol/L (ref 134–144)
Total Protein: 6.6 g/dL (ref 6.0–8.5)
eGFR: 72 mL/min/{1.73_m2} (ref >59)

## 2022-03-04 LAB — CBC WITH DIFFERENTIAL/PLATELET
Basophils Absolute: 0 10*3/uL (ref 0.0–0.2)
Basos: 0 %
EOS (ABSOLUTE): 0.2 10*3/uL (ref 0.0–0.4)
Eos: 3 %
Hematocrit: 37.7 % (ref 34.0–46.6)
Hemoglobin: 11.9 g/dL (ref 11.1–15.9)
Immature Grans (Abs): 0 10*3/uL (ref 0.0–0.1)
Immature Granulocytes: 0 %
Lymphocytes Absolute: 1.4 10*3/uL (ref 0.7–3.1)
Lymphs: 16 %
MCH: 27.5 pg (ref 26.6–33.0)
MCHC: 31.6 g/dL (ref 31.5–35.7)
MCV: 87 fL (ref 79–97)
Monocytes Absolute: 0.5 10*3/uL (ref 0.1–0.9)
Monocytes: 6 %
Neutrophils Absolute: 6.5 10*3/uL (ref 1.4–7.0)
Neutrophils: 75 %
Platelets: 315 10*3/uL (ref 150–450)
RBC: 4.33 x10E6/uL (ref 3.77–5.28)
RDW: 14.8 % (ref 11.7–15.4)
WBC: 8.8 10*3/uL (ref 3.4–10.8)

## 2022-03-04 LAB — TSH: TSH: 4.71 u[IU]/mL — ABNORMAL HIGH (ref 0.450–4.500)

## 2022-03-04 LAB — URIC ACID: Uric Acid: 6.1 mg/dL (ref 3.0–7.2)

## 2022-03-04 MED ORDER — LEVOTHYROXINE SODIUM 200 MCG PO TABS
200.0000 ug | ORAL_TABLET | Freq: Every day | ORAL | 1 refills | Status: DC
Start: 2022-03-04 — End: 2022-04-19

## 2022-04-18 ENCOUNTER — Other Ambulatory Visit: Payer: Medicare Other

## 2022-04-18 DIAGNOSIS — E039 Hypothyroidism, unspecified: Secondary | ICD-10-CM

## 2022-04-19 ENCOUNTER — Other Ambulatory Visit: Payer: Self-pay | Admitting: Family Medicine

## 2022-04-19 LAB — TSH: TSH: 0.772 u[IU]/mL (ref 0.450–4.500)

## 2022-04-19 MED ORDER — LEVOTHYROXINE SODIUM 200 MCG PO TABS
200.0000 ug | ORAL_TABLET | Freq: Every day | ORAL | 3 refills | Status: DC
Start: 2022-04-19 — End: 2023-06-23

## 2022-05-05 ENCOUNTER — Ambulatory Visit
Admission: RE | Admit: 2022-05-05 | Discharge: 2022-05-05 | Disposition: A | Discharge status: Home / Self Care | Payer: Medicare Other | Source: Ambulatory Visit | Attending: Oncology | Admitting: Oncology

## 2022-05-05 DIAGNOSIS — Z7901 Long term (current) use of anticoagulants: Secondary | ICD-10-CM

## 2022-05-05 DIAGNOSIS — Z853 Personal history of malignant neoplasm of breast: Secondary | ICD-10-CM | POA: Insufficient documentation

## 2022-05-05 DIAGNOSIS — N6321 Unspecified lump in the left breast, upper outer quadrant: Secondary | ICD-10-CM | POA: Diagnosis not present

## 2022-05-10 ENCOUNTER — Inpatient Hospital Stay (HOSPITAL_BASED_OUTPATIENT_CLINIC_OR_DEPARTMENT_OTHER): Payer: Medicare Other | Admitting: Oncology

## 2022-05-10 ENCOUNTER — Inpatient Hospital Stay: Payer: Medicare Other | Attending: Oncology

## 2022-05-10 ENCOUNTER — Encounter: Payer: Self-pay | Admitting: Oncology

## 2022-05-10 VITALS — BP 186/72 | HR 66 | Temp 98.2°F | Wt 311.0 lb

## 2022-05-10 DIAGNOSIS — Z171 Estrogen receptor negative status [ER-]: Secondary | ICD-10-CM | POA: Insufficient documentation

## 2022-05-10 DIAGNOSIS — M79671 Pain in right foot: Secondary | ICD-10-CM | POA: Insufficient documentation

## 2022-05-10 DIAGNOSIS — Z7901 Long term (current) use of anticoagulants: Secondary | ICD-10-CM | POA: Insufficient documentation

## 2022-05-10 DIAGNOSIS — C50511 Malignant neoplasm of lower-outer quadrant of right female breast: Secondary | ICD-10-CM | POA: Diagnosis not present

## 2022-05-10 DIAGNOSIS — G8929 Other chronic pain: Secondary | ICD-10-CM | POA: Insufficient documentation

## 2022-05-10 DIAGNOSIS — I4891 Unspecified atrial fibrillation: Secondary | ICD-10-CM | POA: Insufficient documentation

## 2022-05-10 DIAGNOSIS — C50811 Malignant neoplasm of overlapping sites of right female breast: Secondary | ICD-10-CM | POA: Diagnosis present

## 2022-05-10 LAB — CBC WITH DIFFERENTIAL/PLATELET
Abs Immature Granulocytes: 0.03 10*3/uL (ref 0.00–0.07)
Basophils Absolute: 0 10*3/uL (ref 0.0–0.1)
Basophils Relative: 0 %
Eosinophils Absolute: 0.2 10*3/uL (ref 0.0–0.5)
Eosinophils Relative: 2 %
HCT: 40.4 % (ref 36.0–46.0)
Hemoglobin: 12.4 g/dL (ref 12.0–15.0)
Immature Granulocytes: 0 %
Lymphocytes Relative: 18 %
Lymphs Abs: 1.7 10*3/uL (ref 0.7–4.0)
MCH: 28.1 pg (ref 26.0–34.0)
MCHC: 30.7 g/dL (ref 30.0–36.0)
MCV: 91.4 fL (ref 80.0–100.0)
Monocytes Absolute: 0.6 10*3/uL (ref 0.1–1.0)
Monocytes Relative: 6 %
Neutro Abs: 7 10*3/uL (ref 1.7–7.7)
Neutrophils Relative %: 74 %
Platelets: 302 10*3/uL (ref 150–400)
RBC: 4.42 MIL/uL (ref 3.87–5.11)
RDW: 15.5 % (ref 11.5–15.5)
WBC: 9.5 10*3/uL (ref 4.0–10.5)
nRBC: 0 % (ref 0.0–0.2)

## 2022-05-10 LAB — COMPREHENSIVE METABOLIC PANEL
ALT: 19 U/L (ref 0–44)
AST: 20 U/L (ref 15–41)
Albumin: 3.7 g/dL (ref 3.5–5.0)
Alkaline Phosphatase: 85 U/L (ref 38–126)
Anion gap: 12 (ref 5–15)
BUN: 16 mg/dL (ref 8–23)
CO2: 30 mmol/L (ref 22–32)
Calcium: 9.3 mg/dL (ref 8.9–10.3)
Chloride: 96 mmol/L — ABNORMAL LOW (ref 98–111)
Creatinine, Ser: 0.94 mg/dL (ref 0.44–1.00)
GFR, Estimated: >60 mL/min (ref >60)
Glucose, Bld: 86 mg/dL (ref 70–99)
Potassium: 3.7 mmol/L (ref 3.5–5.1)
Sodium: 138 mmol/L (ref 135–145)
Total Bilirubin: 0.2 mg/dL — ABNORMAL LOW (ref 0.3–1.2)
Total Protein: 7.8 g/dL (ref 6.5–8.1)

## 2022-05-10 NOTE — Progress Notes (Signed)
Hematology/Oncology Progress note [phone] [phone]      Patient Care Team: Valerie Roys, DO as PCP - General (Family Medicine) Thornton Park, MD as Referring Physician (Orthopedic Surgery) Rico Junker, RN as Oncology Nurse Navigator Byrnett, Forest Gleason, MD (General Surgery) Earlie Server, MD as Medical Oncologist (Medical Oncology) Noreene Filbert, MD as Referring Physician (Radiation Oncology)  REASON FOR VISIT Follow up for Breast cancer  HISTORY OF PRESENTING ILLNESS:  Wendy Marsh is a  66 y.o.  female follow-up of breast cancer.  04/28/2019 diagnostic mammogram Mammogram showed right breast 6 o'clock axis breast mass, 2.9 cm corresponding to area of concern.  cT2N0  Breast mass was biopsied, and pathology showed invasive mammary carcinoma.  Grade 3 , ER/PR- HER2 -.  LVI suspicious, favor present.   #  baseline MUGA testing done and reviewed systolic function depression, LVEF 45%. Given that Adriamycin has cardiac toxicity, potentially can lead to irreversible cardiomyopathy, I discussed with patient about using non-anthracycline-containing regimen. # 05/22/2018- 09/11/2018  S/p 6 cycle of Docetaxel 75m/m2 and Carboplatin every 3 weeks [ data from PBathet al 2018 Clin Cancer Res, Pathological Response and Survival in Triple-Negative Breast Cancer Following Neoadjuvant Carboplatin plus Docetaxel.Stage I-III TNBC patients, Neoadjuvant carboplatin (AUC6) plus docetaxel (75 mg/m2) every 21 days  6 cycles. pCR was 55% and Residual cancer burden (RCB) were 13%. Excellent Three-year RFS was 90% in patients with pCR and 66% in those without pCR]    # 10/29/2018   right breast lumpectomy with  excisional biopsy of the right axillary deep sentinel lymph nodes. Pathology showed CR  ypT0, ypN0- complete pathological remission.   # # Family history of breast cancer plus personal history of triple negative breast cancer. Testing  did not reveal a pathogenic mutation in any of the genes analyzed.   BRCA negative.  VUS was detected. Does not change management.   #Mediport was removed. INTERVAL HISTORY Wendy HORANis a 66y.o. female who has above history reviewed by me today presents for discussion of pathology and further management plan for triple negative breast cancer. + right heel pain, chronic and intermittently.  Not worse. Patient reports feeling well today.  No new complaints. Patient continues to have intermittent brain fog, difficulty finding words.  This is chronic. .. Review of Systems  Constitutional:  Negative for chills, fever, malaise/fatigue and weight loss.  HENT:  Negative for nosebleeds and sore throat.   Eyes:  Negative for double vision, photophobia and redness.  Respiratory:  Negative for cough, shortness of breath and wheezing.   Cardiovascular:  Negative for chest pain, palpitations, orthopnea and leg swelling.  Gastrointestinal:  Negative for abdominal pain, blood in stool, nausea and vomiting.  Genitourinary:  Negative for dysuria.  Musculoskeletal:  Positive for back pain and joint pain. Negative for myalgias and neck pain.       Chronic right knee pain.  Skin:  Negative for itching and rash.  Neurological:  Negative for dizziness, tingling and tremors.  Endo/Heme/Allergies:  Negative for environmental allergies. Does not bruise/bleed easily.  Psychiatric/Behavioral:  Negative for depression and hallucinations.     MEDICAL HISTORY:  Past Medical History:  Diagnosis Date   Arthritis    [place]    [place]   [place] breast   Dysrhythmia    afib   Hyperlipidemia    Hypertension    Hypotension due to drugs 05/30/2018   Hypothyroidism    Lumbago  Osteoporosis    Personal history of chemotherapy    Personal history of radiation therapy    Thyroid disease     SURGICAL HISTORY: Past Surgical History:  Procedure Laterality Date    BREAST BIOPSY Right 05/02/2018   Invasive mammary carcinoma, grade 3, neo adj chemo   BREAST LUMPECTOMY Right 10/29/2018   NO RESIDUAL MALIGNANCY IDENTIFIED.    BREAST LUMPECTOMY WITH NEEDLE LOCALIZATION AND AXILLARY SENTINEL LYMPH NODE BX Right 10/29/2018   Procedure: BREAST LUMPECTOMY WITH NEEDLE LOCALIZATION AND SENTINEL LYMPH NODE BX;  Surgeon: Vickie Epley, MD;  Location: [facility] ORS;  Service: General;  Laterality: Right;   JOINT REPLACEMENT Left    tkr   PORTACATH PLACEMENT Right 05/11/2018   Procedure: INSERTION  I.J PORT-A-CATH;  Surgeon: Robert Bellow, MD;  Location: [facility] ORS;  Service: General;  Laterality: Right;   REPLACEMENT TOTAL KNEE      SOCIAL HISTORY: Social History   Socioeconomic History   Marital status: Married    Spouse name: jerry   Number of children: 3   Years of education: Not on file   Highest education level: Some college, no degree  Occupational History   Not on file  Tobacco Use   Smoking status: Never   Smokeless tobacco: Never  Vaping Use   Vaping Use: Never used  Substance and Sexual Activity   Alcohol use: No   Drug use: No   Sexual activity: Yes    Birth control/protection: Post-menopausal  Other Topics Concern   Not on file  Social History Narrative   Not on file   Social Determinants of Health   Financial Resource Strain: Low Risk  (06/29/2021)   Overall Financial Resource Strain (CARDIA)    Difficulty of Paying Living Expenses: Not hard at all  Food Insecurity: No Food Insecurity (06/29/2021)   Hunger Vital Sign    Worried About Running Out of Food in the Last Year: Never true    Ran Out of Food in the Last Year: Never true  Transportation Needs: No Transportation Needs (06/29/2021)   PRAPARE - Hydrologist (Medical): No    Lack of Transportation (Non-Medical): No  Physical Activity: Inactive (06/29/2021)   Exercise Vital Sign    Days of Exercise per Week: 0 days    Minutes of Exercise per  Session: 0 min  Stress: No Stress Concern Present (06/29/2021)   [place]    Feeling of Stress : Not at all  Social Connections: Moderately Isolated (06/29/2021)   Social Connection and Isolation Panel [NHANES]    Frequency of Communication with Friends and Family: More than three times a week    Frequency of Social Gatherings with Friends and Family: Twice a week    Attends Religious Services: Never    Marine scientist or Organizations: No    Attends Archivist Meetings: Never    Marital Status: Married  Human resources officer Violence: Not At Risk (06/29/2021)   Humiliation, Afraid, Rape, and Kick questionnaire    Fear of Current or Ex-Partner: No    Emotionally Abused: No    Physically Abused: No    Sexually Abused: No    FAMILY HISTORY: Family History  Problem Relation Age of Onset   Diabetes Mother    Congestive Heart Failure Mother    Breast cancer Maternal Grandmother        dx 55s; deceased 40s   Cancer Maternal Uncle 59  deceased 21s   Other Father        No information on father or paternal relatives   Breast cancer Other 58       paternal half-sister; also esophageal ca at 37; currently 16   Diabetes Other    Hypertension Other    Kidney disease Other    Thyroid disease Other     ALLERGIES:  is allergic to gabapentin, morphine and related, peanut-containing drug products, and tape.  MEDICATIONS:  Current Outpatient Medications  Medication Sig Dispense Refill   acetaminophen (TYLENOL) 500 MG tablet Take 500 mg by mouth every 6 (six) hours as needed.     allopurinol (ZYLOPRIM) 300 MG tablet Take 1 tablet (300 mg total) by mouth daily. 90 tablet 1   apixaban (ELIQUIS) 5 MG TABS tablet Take 5 mg by mouth in the morning and at bedtime.     EPINEPHrine 0.3 mg/0.3 mL IJ SOAJ injection Inject 0.3 mg into the muscle as needed for anaphylaxis. 1 each 12   flecainide (TAMBOCOR) 100 MG  tablet Take 100 mg by mouth 2 (two) times daily.     levothyroxine (SYNTHROID) 200 MCG tablet Take 1 tablet (200 mcg total) by mouth daily before breakfast. 90 tablet 3   lisinopril-hydrochlorothiazide (ZESTORETIC) 20-25 MG tablet Take 2 tablets by mouth daily. 180 tablet 1   metoprolol succinate (TOPROL-XL) 50 MG 24 hr tablet TAKE 1 TABLET BY MOUTH ONCE DAILY WITH  OR  IMMEDIATELY  FOLLOWING  A  MEAL 90 tablet 1   No current facility-administered medications for this visit.     PHYSICAL EXAMINATION: ECOG PERFORMANCE STATUS: 1 - Symptomatic but completely ambulatory Vitals:   05/10/22 1413  BP: (!) 186/72  Pulse: 66  Temp: 98.2 F (36.8 C)   Filed Weights   05/10/22 1413  Weight: (!) 311 lb (141.1 kg)    Physical Exam Constitutional:      General: She is not in acute distress.    Appearance: She is obese.     Comments: Obese, walks with a walker  HENT:     Head: Normocephalic and atraumatic.  Eyes:     General: No scleral icterus.    Pupils: Pupils are equal, round, and reactive to light.  Cardiovascular:     Rate and Rhythm: Normal rate and regular rhythm.     Heart sounds: Normal heart sounds.  Pulmonary:     Effort: Pulmonary effort is normal. No respiratory distress.     Breath sounds: No wheezing.  Abdominal:     General: Bowel sounds are normal. There is no distension.     Palpations: Abdomen is soft. There is no mass.     Tenderness: There is no abdominal tenderness.  Musculoskeletal:        General: No deformity. Normal range of motion.     Cervical back: Normal range of motion and neck supple.     Comments: Chronic lower extremity edema.  1+  Skin:    General: Skin is warm and dry.     Findings: No erythema or rash.  Neurological:     Mental Status: She is alert and oriented to person, place, and time. Mental status is at baseline.     Cranial Nerves: No cranial nerve deficit.     Coordination: Coordination normal.  Psychiatric:        Mood and Affect:  Mood normal.       LABORATORY DATA:  I have reviewed the data as listed  Latest Ref Rng & Units 05/10/2022    1:35 PM 03/03/2022    9:50 AM 11/12/2021   11:20 AM  CBC  WBC 4.0 - 10.5 K/uL 9.5  8.8  9.2   Hemoglobin 12.0 - 15.0 g/dL 12.4  11.9  12.1   Hematocrit 36.0 - 46.0 % 40.4  37.7  38.0   Platelets 150 - 400 K/uL 302  315  305       Latest Ref Rng & Units 05/10/2022    1:35 PM 03/03/2022    9:50 AM 11/12/2021   11:20 AM  CMP  Glucose 70 - 99 mg/dL 86  82  110   BUN 8 - 23 mg/dL _0 Creatinine 0.44 - 1.00 mg/dL 0.94  0.88  0.95   Sodium 135 - 145 mmol/L 138  141  138   Potassium 3.5 - 5.1 mmol/L 3.7  3.9  3.5   Chloride 98 - 111 mmol/L 96  98  97   CO2 22 - 32 mmol/L _1 Calcium 8.9 - 10.3 mg/dL 9.3  9.1  9.5   Total Protein 6.5 - 8.1 g/dL 7.8  6.6  7.5   Total Bilirubin 0.3 - 1.2 mg/dL 0.2  0.2  0.3   Alkaline Phos 38 - 126 U/L 85  101  86   AST 15 - 41 U/L _2 ALT 0 - 44 U/L _3 RADIOGRAPHIC STUDIES: I have personally reviewed the radiological images as listed and agreed with the findings in the report. NM Cardiac Muga rest: Calculated LEFT ventricular ejection fraction equals 45%   ASSESSMENT & PLAN:  1. Malignant neoplasm of lower-outer quadrant of right breast of female, estrogen receptor negative ([place])   2. 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 .  Status post right lumpectomy with excisional biopsy of sentinel lymph node. S/p radiation.   Patient achieved complete pathological remission,  ypT0 ypN0 Clinically patient is doing very well.  She is 3.5 years after surgery. 05/05/2022, bilateral diagnostic mammogram showed stable appearance of the left breast mass for greater than 2 years.  Consistent with a benign etiology.  No mammographic evidence malignancy bilaterally. Labs were reviewed and discussed with patient.  Tumor markers are pending.  Continue observation and imaging surveillance.  #A-fib, on anticoagulation with Eliquis.  Hemoglobin stable. #Right heel pain, likely musculoskeletal.  Continue monitor symptoms.   Orders Placed This Encounter  Procedures   Cancer antigen 15-3    Standing Status:   Future    Standing Expiration Date:   05/11/2023   Cancer antigen 27.29    Standing Status:   Future    Standing Expiration Date:   05/11/2023   CBC with Differential/Platelet    Standing Status:   Future    Standing Expiration Date:   05/11/2023   Comprehensive metabolic panel    Standing Status:   Future    Standing  Expiration Date:   05/11/2023    Return of visit: 6 months with repeat CBC and CMP, CA 15.3, CA 27.29,  Earlie Server, MD, PhD 05/10/2022

## 2022-05-11 LAB — CANCER ANTIGEN 15-3: CA 15-3: 11.8 U/mL (ref 0.0–25.0)

## 2022-05-11 LAB — CANCER ANTIGEN 27.29: CA 27.29: 17 U/mL (ref 0.0–38.6)

## 2022-06-30 ENCOUNTER — Ambulatory Visit (INDEPENDENT_AMBULATORY_CARE_PROVIDER_SITE_OTHER): Payer: Medicare Other

## 2022-06-30 DIAGNOSIS — Z1211 Encounter for screening for malignant neoplasm of colon: Secondary | ICD-10-CM

## 2022-06-30 DIAGNOSIS — Z0000 Encounter for general adult medical examination without abnormal findings: Secondary | ICD-10-CM | POA: Diagnosis not present

## 2022-06-30 NOTE — Progress Notes (Signed)
I connected with  Wendy Marsh on 06/30/22 by a audio enabled telemedicine application and verified that I am speaking with the correct person using two identifiers.  Patient Location: Home  Provider Location: Office/Clinic  I discussed the limitations of evaluation and management by telemedicine. The patient expressed understanding and agreed to proceed.   Subjective:   Wendy Marsh is a 66 y.o. female who presents for Medicare Annual (Subsequent) preventive examination.  Review of Systems    Defer to PCP.    Objective:    There were no vitals filed for this visit. There is no height or weight on file to calculate BMI.     05/10/2022    2:08 PM 08/16/2021   10:28 AM 06/29/2021    5:05 PM 05/12/2021    2:41 PM 11/05/2020   10:31 AM 05/08/2020    1:08 PM 12/11/2019    2:29 PM  Advanced Directives  Does Patient Have a Medical Advance Directive? No  No No No No No  Would patient like information on creating a medical advance directive?  No - Patient declined No - Patient declined  No - Patient declined      Current Medications (verified) Outpatient Encounter Medications as of 06/30/2022  Medication Sig   acetaminophen (TYLENOL) 500 MG tablet Take 500 mg by mouth every 6 (six) hours as needed.   allopurinol (ZYLOPRIM) 300 MG tablet Take 1 tablet (300 mg total) by mouth daily.   apixaban (ELIQUIS) 5 MG TABS tablet Take 5 mg by mouth in the morning and at bedtime.   EPINEPHrine 0.3 mg/0.3 mL IJ SOAJ injection Inject 0.3 mg into the muscle as needed for anaphylaxis.   flecainide (TAMBOCOR) 100 MG tablet Take 100 mg by mouth 2 (two) times daily.   levothyroxine (SYNTHROID) 200 MCG tablet Take 1 tablet (200 mcg total) by mouth daily before breakfast.   lisinopril-hydrochlorothiazide (ZESTORETIC) 20-25 MG tablet Take 2 tablets by mouth daily.   metoprolol succinate (TOPROL-XL) 50 MG 24 hr tablet TAKE 1 TABLET BY MOUTH ONCE DAILY WITH  OR  IMMEDIATELY  FOLLOWING  A  MEAL   No  facility-administered encounter medications on file as of 06/30/2022.    Allergies (verified) Gabapentin, Morphine and related, Peanut-containing drug products, and Tape   History: Past Medical History:  Diagnosis Date   Arthritis    Atrial fibrillation ([place])    Breast cancer ([place]) 04/2018   right breast   Dysrhythmia    afib   Hyperlipidemia    Hypertension    Hypotension due to drugs 05/30/2018   Hypothyroidism    Lumbago    Osteoporosis    Personal history of chemotherapy    Personal history of radiation therapy    Thyroid disease    Past Surgical History:  Procedure Laterality Date   BREAST BIOPSY Right 05/02/2018   Invasive mammary carcinoma, grade 3, neo adj chemo   BREAST LUMPECTOMY Right 10/29/2018   NO RESIDUAL MALIGNANCY IDENTIFIED.    BREAST LUMPECTOMY WITH NEEDLE LOCALIZATION AND AXILLARY SENTINEL LYMPH NODE BX Right 10/29/2018   Procedure: BREAST LUMPECTOMY WITH NEEDLE LOCALIZATION AND SENTINEL LYMPH NODE BX;  Surgeon: Vickie Epley, MD;  Location: [facility] ORS;  Service: General;  Laterality: Right;   JOINT REPLACEMENT Left    tkr   PORTACATH PLACEMENT Right 05/11/2018   Procedure: INSERTION  I.J PORT-A-CATH;  Surgeon: Robert Bellow, MD;  Location: [facility] ORS;  Service: General;  Laterality: Right;   REPLACEMENT TOTAL KNEE  Family History  Problem Relation Age of Onset   Diabetes Mother    Congestive Heart Failure Mother    Breast cancer Maternal Grandmother        dx 97s; deceased 71s   Cancer Maternal Uncle 70       deceased 42s   Other Father        No information on father or paternal relatives   Breast cancer Other 52       paternal half-sister; also esophageal ca at 36; currently 18   Diabetes Other    Hypertension Other    Kidney disease Other    Thyroid disease Other    Social History   Socioeconomic History   Marital status: Married    Spouse name: jerry   Number of children: 3   Years of education: Not on file   Highest education  level: Some college, no degree  Occupational History   Not on file  Tobacco Use   Smoking status: Never   Smokeless tobacco: Never  Vaping Use   Vaping Use: Never used  Substance and Sexual Activity   Alcohol use: No   Drug use: No   Sexual activity: Yes    Birth control/protection: Post-menopausal  Other Topics Concern   Not on file  Social History Narrative   Not on file   Social Determinants of Health   Financial Resource Strain: Low Risk  (06/30/2022)   Overall Financial Resource Strain (CARDIA)    Difficulty of Paying Living Expenses: Not hard at all  Food Insecurity: No Food Insecurity (06/30/2022)   Hunger Vital Sign    Worried About Running Out of Food in the Last Year: Never true    [place] in the Last Year: Never true  Transportation Needs: No Transportation Needs (06/30/2022)   PRAPARE - Hydrologist (Medical): No    Lack of Transportation (Non-Medical): No  Physical Activity: Inactive (06/30/2022)   Exercise Vital Sign    Days of Exercise per Week: 0 days    Minutes of Exercise per Session: 0 min  Stress: No Stress Concern Present (06/30/2022)   [place]    Feeling of Stress : Not at all  Social Connections: Moderately Isolated (06/30/2022)   Social Connection and Isolation Panel [NHANES]    Frequency of Communication with Friends and Family: Twice a week    Frequency of Social Gatherings with Friends and Family: Twice a week    Attends Religious Services: Never    Printmaker: No    Attends Music therapist: Never    Marital Status: Married    Tobacco Counseling Counseling given: Not Answered   Clinical Intake:  Pre-visit preparation completed: Yes  Pain : No/denies pain     Diabetes: No  How often do you need to have someone help you when you read instructions, pamphlets, or other written materials from  your doctor or pharmacy?: 1 - Never What is the last grade level you completed in school?: 12th grade, some college courses  Diabetic- No  Interpreter Needed?: No      Activities of Daily Living    06/30/2022    3:04 PM 06/26/2022    9:02 PM  In your present state of health, do you have any difficulty performing the following activities:  Hearing? 0 0  Vision? 0 0  Difficulty concentrating or making decisions? 0 0  Walking  or climbing stairs? 0 0  Dressing or bathing? 0 0  Doing errands, shopping? 0 0  Preparing Food and eating ?  N  Using the Toilet?  N  In the past six months, have you accidently leaked urine?  N  Do you have problems with loss of bowel control?  N  Managing your Medications?  N  Managing your Finances?  N  Housekeeping or managing your Housekeeping?  N    Patient Care Team: Valerie Roys, DO as PCP - General (Family Medicine) Thornton Park, MD as Referring Physician (Orthopedic Surgery) Rico Junker, RN as Oncology Nurse Navigator Byrnett, Forest Gleason, MD (General Surgery) Earlie Server, MD as Medical Oncologist (Medical Oncology) Noreene Filbert, MD as Referring Physician (Radiation Oncology)  Indicate any recent Medical Services you may have received from other than [facility] providers in the past year (date may be approximate).     Assessment:   This is a routine wellness examination for Wendy Marsh.  Hearing/Vision screen No results found.  Dietary issues and exercise activities discussed:     Goals Addressed   None   Depression Screen    06/30/2022    3:02 PM 03/03/2022    9:24 AM 09/02/2021   10:07 AM 06/29/2021    5:03 PM 03/03/2021   10:03 AM 06/16/2020   10:17 AM 05/21/2019   10:41 AM  PHQ 2/9 Scores  PHQ - 2 Score 0 0 0 0 0 0 0  PHQ- 9 Score 0 0 0   0     Fall Risk    06/30/2022    3:03 PM 06/26/2022    9:02 PM 03/03/2022    9:24 AM 09/02/2021   10:07 AM 06/29/2021    5:05 PM  [place] in the past year? 0 0 0 0 0  Number  falls in past yr: 0  0 0 0  Injury with Fall? 0  0 0 0  Risk for fall due to : No Fall Risks  No Fall Risks No Fall Risks No Fall Risks  Follow up Falls evaluation completed  Falls evaluation completed Falls evaluation completed Falls evaluation completed    [place]:  Any stairs in or around the home? Yes  If so, are there any without handrails? No  Home free of loose throw rugs in walkways, pet beds, electrical cords, etc? Yes  Adequate lighting in your home to reduce risk of falls? Yes   ASSISTIVE DEVICES UTILIZED TO PREVENT FALLS:  Life alert? No  Use of a cane, walker or w/c? Yes  Grab bars in the bathroom? No  Shower chair or bench in shower? No  Elevated toilet seat or a handicapped toilet? No   TIMED UP AND GO:  Was the test performed? No .   Cognitive Function:        06/30/2022    3:04 PM 06/29/2021    5:06 PM 06/16/2020   11:07 AM  6CIT Screen  What Year? 0 points 0 points 0 points  What month? 0 points 0 points 0 points  What time? 0 points 0 points 0 points  Count back from 20 0 points 0 points 0 points  Months in reverse 0 points 0 points 0 points  Repeat phrase 0 points 0 points 0 points  Total Score 0 points 0 points 0 points    Immunizations Immunization History  Administered Date(s) Administered   Fluad Quad(high Dose 65+) 06/16/2020  Moderna Sars-Covid-2 Vaccination 02/18/2020, 03/17/2020   Pneumococcal Conjugate-13 06/16/2020   Td 12/17/2008   Tdap 05/21/2019    TDAP status: Up to date  Flu Vaccine status: Due, Education has been provided regarding the importance of this vaccine. Advised may receive this vaccine at local pharmacy or Health Dept. Aware to provide a copy of the vaccination record if obtained from local pharmacy or Health Dept. Verbalized acceptance and understanding.  Pneumococcal vaccine status: Due, Education has been provided regarding the importance of this vaccine. Advised may receive  this vaccine at local pharmacy or Health Dept. Aware to provide a copy of the vaccination record if obtained from local pharmacy or Health Dept. Verbalized acceptance and understanding.  Covid-19 vaccine status: Declined, Education has been provided regarding the importance of this vaccine but patient still declined. Advised may receive this vaccine at local pharmacy or Health Dept.or vaccine clinic. Aware to provide a copy of the vaccination record if obtained from local pharmacy or Health Dept. Verbalized acceptance and understanding.  Qualifies for Shingles Vaccine? Yes   Zostavax completed Yes   Shingrix Completed?: No.    Education has been provided regarding the importance of this vaccine. Patient has been advised to call insurance company to determine out of pocket expense if they have not yet received this vaccine. Advised may also receive vaccine at local pharmacy or Health Dept. Verbalized acceptance and understanding.  Screening Tests Health Maintenance  Topic Date Due   Zoster Vaccines- Shingrix (1 of 2) Never done   Fecal DNA (Cologuard)  Never done   COVID-19 Vaccine (3 - Moderna risk series) 04/14/2020   Pneumonia Vaccine 45+ Years old (2 - PPSV23 or PCV20) 06/16/2021   INFLUENZA VACCINE  07/29/2022 (Originally 05/03/2022)   MAMMOGRAM  05/05/2024   TETANUS/TDAP  05/20/2029   DEXA SCAN  Completed   Hepatitis C Screening  Completed   HPV VACCINES  Aged Out    Health Maintenance  Health Maintenance Due  Topic Date Due   Zoster Vaccines- Shingrix (1 of 2) Never done   Fecal DNA (Cologuard)  Never done   COVID-19 Vaccine (3 - Moderna risk series) 04/14/2020   Pneumonia Vaccine 62+ Years old (2 - PPSV23 or PCV20) 06/16/2021    Colorectal Screening- Due, ordered Cologuard at today's visit.  Mammogram status: Completed 05/05/22. Repeat every year  Bone Density- Completed 11/02/20  Lung Cancer Screening: (Low Dose CT Chest recommended if Age 53-80 years, 30 pack-year currently  smoking OR have quit w/in 15years.) does not qualify.    Additional Screening:  Hepatitis C Screening: does qualify; Completed 05/21/19  Vision Screening: Recommended annual ophthalmology exams for early detection of glaucoma and other disorders of the eye. Is the patient up to date with their annual eye exam?  Yes  Who is the provider or what is the name of the office in which the patient attends annual eye exams? [facility] If pt is not established with a provider, would they like to be referred to a provider to establish care? No .   Dental Screening: Recommended annual dental exams for proper oral hygiene  Community Resource Referral / Chronic Care Management: CRR required this visit?  No   CCM required this visit?  No      Plan:     I have personally reviewed and noted the following in the patient's chart:   Medical and social history Use of alcohol, tobacco or illicit drugs  Current medications and supplements including opioid prescriptions. Patient is  not currently taking opioid prescriptions. Functional ability and status Nutritional status Physical activity Advanced directives List of other physicians Hospitalizations, surgeries, and ER visits in previous 12 months Vitals Screenings to include cognitive, depression, and falls Referrals and appointments  In addition, I have reviewed and discussed with patient certain preventive protocols, quality metrics, and best practice recommendations. A written personalized care plan for preventive services as well as general preventive health recommendations were provided to patient.     Georgina Peer, [place]   06/30/2022   Nurse Notes: Non Face to Face 30 minutes.     Wendy Marsh , Thank you for taking time to come for your Medicare Wellness Visit. I appreciate your ongoing commitment to your health goals. Please review the following plan we discussed and let me know if I can assist you in the future.   These are  the goals we discussed:  Goals   None     This is a list of the screening recommended for you and due dates:  Health Maintenance  Topic Date Due   Zoster (Shingles) Vaccine (1 of 2) Never done   Cologuard (Stool DNA test)  Never done   COVID-19 Vaccine (3 - Moderna risk series) 04/14/2020   Pneumonia Vaccine (2 - PPSV23 or PCV20) 06/16/2021   Flu Shot  07/29/2022*   Mammogram  05/05/2024   Tetanus Vaccine  05/20/2029   DEXA scan (bone density measurement)  Completed   Hepatitis C Screening: USPSTF Recommendation to screen - Ages 91-79 yo.  Completed   HPV Vaccine  Aged Out  *Topic was postponed. The date shown is not the original due date.

## 2022-06-30 NOTE — Patient Instructions (Signed)

## 2022-08-22 ENCOUNTER — Ambulatory Visit: Payer: Medicare Other | Admitting: Radiation Oncology

## 2022-08-24 ENCOUNTER — Other Ambulatory Visit: Payer: Self-pay | Admitting: Family Medicine

## 2022-08-24 DIAGNOSIS — I10 Essential (primary) hypertension: Secondary | ICD-10-CM

## 2022-08-24 NOTE — Telephone Encounter (Signed)
Requested Prescriptions  Pending Prescriptions Disp Refills   lisinopril-hydrochlorothiazide (ZESTORETIC) 20-25 MG tablet [Pharmacy Med Name: Lisinopril-hydroCHLOROthiazide 20-25 MG Oral Tablet] 180 tablet 0    Sig: Take 2 tablets by mouth once daily     Cardiovascular:  ACEI + Diuretic Combos Failed - 08/24/2022 12:23 PM      Failed - Last BP in normal range    BP Readings from Last 1 Encounters:  05/10/22 (!) 186/72         Passed - Na in normal range and within 180 days    Sodium  Date Value Ref Range Status  05/10/2022 138 135 - 145 mmol/L Final  03/03/2022 141 134 - 144 mmol/L Final         Passed - K in normal range and within 180 days    Potassium  Date Value Ref Range Status  05/10/2022 3.7 3.5 - 5.1 mmol/L Final         Passed - Cr in normal range and within 180 days    Creatinine, Ser  Date Value Ref Range Status  05/10/2022 0.94 0.44 - 1.00 mg/dL Final         Passed - eGFR is 30 or above and within 180 days    GFR calc Af Amer  Date Value Ref Range Status  06/16/2020 84 >59 mL/min/1.73 Final    Comment:    **[facility] currently reports eGFR in compliance with the current**   recommendations of the [employer]. [facility] will   update reporting as new guidelines are published from the NKF-ASN   Task force.    GFR, Estimated  Date Value Ref Range Status  05/10/2022 >60 >60 mL/min Final    Comment:    (NOTE) Calculated using the CKD-EPI Creatinine Equation (2021)    eGFR  Date Value Ref Range Status  03/03/2022 72 >59 mL/min/1.73 Final         Passed - Patient is not pregnant      Passed - Valid encounter within last 6 months    Recent Outpatient Visits           5 months ago Benign essential hypertension   [place], Megan P, DO   11 months ago Pneumonia due to influenza A virus   [place], [place], DO   1 year ago Benign essential hypertension   [place],  [place], DO   1 year ago [place] toe pain, right   [employer], [place], DO   1 year ago [place] toe pain, right   [employer], Henrine Screws T, NP       Future Appointments             In 1 week Wynetta Emery, Barb Merino, DO [employer], [facility]

## 2022-09-02 ENCOUNTER — Encounter: Payer: Self-pay | Admitting: Family Medicine

## 2022-09-02 ENCOUNTER — Ambulatory Visit (INDEPENDENT_AMBULATORY_CARE_PROVIDER_SITE_OTHER): Payer: Medicare Other | Admitting: Family Medicine

## 2022-09-02 VITALS — BP 133/75 | HR 74 | Temp 98.5°F | Wt 311.7 lb

## 2022-09-02 DIAGNOSIS — Z23 Encounter for immunization: Secondary | ICD-10-CM

## 2022-09-02 DIAGNOSIS — M109 Gout, unspecified: Secondary | ICD-10-CM

## 2022-09-02 DIAGNOSIS — E782 Mixed hyperlipidemia: Secondary | ICD-10-CM | POA: Diagnosis not present

## 2022-09-02 DIAGNOSIS — Z17421 Hormone receptor negative with human epidermal growth factor receptor 2 negative status: Secondary | ICD-10-CM

## 2022-09-02 DIAGNOSIS — I480 Paroxysmal atrial fibrillation: Secondary | ICD-10-CM

## 2022-09-02 DIAGNOSIS — C50919 Malignant neoplasm of unspecified site of unspecified female breast: Secondary | ICD-10-CM

## 2022-09-02 DIAGNOSIS — E039 Hypothyroidism, unspecified: Secondary | ICD-10-CM

## 2022-09-02 DIAGNOSIS — E876 Hypokalemia: Secondary | ICD-10-CM

## 2022-09-02 DIAGNOSIS — I10 Essential (primary) hypertension: Secondary | ICD-10-CM

## 2022-09-02 MED ORDER — METOPROLOL SUCCINATE ER 50 MG PO TB24: ORAL_TABLET | ORAL | 1 refills | Status: DC

## 2022-09-02 MED ORDER — LISINOPRIL-HYDROCHLOROTHIAZIDE 20-25 MG PO TABS: 2.0000 | ORAL_TABLET | Freq: Every day | ORAL | 1 refills | Status: DC

## 2022-09-02 MED ORDER — ALLOPURINOL 300 MG PO TABS: 300.0000 mg | ORAL_TABLET | Freq: Every day | ORAL | 1 refills | Status: DC

## 2022-09-02 MED ORDER — EPINEPHRINE 0.3 MG/0.3ML IJ SOAJ: 0.3000 mg | INTRAMUSCULAR | 12 refills | Status: DC | PRN

## 2022-09-02 NOTE — Assessment & Plan Note (Signed)
Under good control on current regimen. Continue current regimen. Continue to monitor. Call with any concerns. Refills given. Labs drawn today.   

## 2022-09-02 NOTE — Progress Notes (Signed)
BP 133/75 (BP Location: Left Arm, Cuff Size: Normal)   Pulse 74   Temp 98.5 F (36.9 C) (Oral)   Wt (!) 311 lb 11.2 oz (141.4 kg)   LMP 03/17/2009 (Approximate)   SpO2 95%   BMI 57.75 kg/m    Subjective:    Patient ID: Wendy Marsh, female    DOB: Feb 15, 1956, 66 y.o.   MRN: [id number]  HPI: Wendy Marsh is a 66 y.o. female  Chief Complaint  Patient presents with   Hypertension   Hypothyroidism   Gout   HYPERTENSION / [place] Satisfied with current treatment? yes Duration of hypertension: chronic BP monitoring frequency: not checking BP medication side effects: no Past BP meds: lisinopril-HCTZ, metoprolol Duration of hyperlipidemia: chronic Cholesterol medication side effects: N/A Cholesterol supplements: none Past cholesterol medications: none Medication compliance: excellent compliance Aspirin: no Recent stressors: no Recurrent headaches: no Visual changes: no Palpitations: yes Dyspnea: no Chest pain: no Lower extremity edema: no Dizzy/lightheaded: no  HYPOTHYROIDISM Thyroid control status:controlled Satisfied with current treatment? yes Medication side effects: no Medication compliance: excellent compliance Etiology of hypothyroidism:  Recent dose adjustment:no Fatigue: no Cold intolerance: no Heat intolerance: no Weight gain: no Weight loss: no Constipation: no Diarrhea/loose stools: no Palpitations: no Lower extremity edema: no Anxiety/depressed mood: no  No gout flares. Feeling well. No concerns.   Relevant past medical, surgical, family and social history reviewed and updated as indicated. Interim medical history since our last visit reviewed. Allergies and medications reviewed and updated.  Review of Systems  Constitutional: Negative.   Respiratory: Negative.    Cardiovascular: Negative.   Gastrointestinal: Negative.   Musculoskeletal: Negative.   Neurological: Negative.   Psychiatric/Behavioral: Negative.       Per HPI unless specifically indicated above     Objective:    BP 133/75 (BP Location: Left Arm, Cuff Size: Normal)   Pulse 74   Temp 98.5 F (36.9 C) (Oral)   Wt (!) 311 lb 11.2 oz (141.4 kg)   LMP 03/17/2009 (Approximate)   SpO2 95%   BMI 57.75 kg/m   Wt Readings from Last 3 Encounters:  09/02/22 (!) 311 lb 11.2 oz (141.4 kg)  05/10/22 (!) 311 lb (141.1 kg)  03/03/22 (!) 311 lb (141.1 kg)    Physical Exam Vitals and nursing note reviewed.  Constitutional:      General: She is not in acute distress.    Appearance: Normal appearance. She is not ill-appearing, toxic-appearing or diaphoretic.  HENT:     Head: Normocephalic and atraumatic.     Right Ear: External ear normal.     Left Ear: External ear normal.     Nose: Nose normal.     Mouth/Throat:     Mouth: Mucous membranes are moist.     Pharynx: Oropharynx is clear.  Eyes:     General: No scleral icterus.       Right eye: No discharge.        Left eye: No discharge.     Extraocular Movements: Extraocular movements intact.     Conjunctiva/sclera: Conjunctivae normal.     Pupils: Pupils are equal, round, and reactive to light.  Cardiovascular:     Rate and Rhythm: Normal rate and regular rhythm.     Pulses: Normal pulses.     Heart sounds: Normal heart sounds. No murmur heard.    No friction rub. No gallop.  Pulmonary:     Effort: Pulmonary effort is normal. No respiratory distress.  Breath sounds: Normal breath sounds. No stridor. No wheezing, rhonchi or rales.  Chest:     Chest wall: No tenderness.  Musculoskeletal:        General: Normal range of motion.     Cervical back: Normal range of motion and neck supple.  Skin:    General: Skin is warm and dry.     Capillary Refill: Capillary refill takes less than 2 seconds.     Coloration: Skin is not jaundiced or pale.     Findings: No bruising, erythema, lesion or rash.  Neurological:     General: No focal deficit present.     Mental Status: She is  alert and oriented to person, place, and time. Mental status is at baseline.  Psychiatric:        Mood and Affect: Mood normal.        Behavior: Behavior normal.        Thought Content: Thought content normal.        Judgment: Judgment normal.     Results for orders placed or performed in visit on 05/10/22  Cancer antigen 27.29  Result Value Ref Range   CA 27.29 17.0 0.0 - 38.6 U/mL  CBC with Differential/Platelet  Result Value Ref Range   WBC 9.5 4.0 - 10.5 K/uL   RBC 4.42 3.87 - 5.11 MIL/uL   Hemoglobin 12.4 12.0 - 15.0 g/dL   HCT 40.4 36.0 - 46.0 %   MCV 91.4 80.0 - 100.0 fL   MCH 28.1 26.0 - 34.0 pg   MCHC 30.7 30.0 - 36.0 g/dL   RDW 15.5 11.5 - 15.5 %   Platelets 302 150 - 400 K/uL   nRBC 0.0 0.0 - 0.2 %   Neutrophils Relative % 74 %   Neutro Abs 7.0 1.7 - 7.7 K/uL   Lymphocytes Relative 18 %   Lymphs Abs 1.7 0.7 - 4.0 K/uL   Monocytes Relative 6 %   Monocytes Absolute 0.6 0.1 - 1.0 K/uL   Eosinophils Relative 2 %   Eosinophils Absolute 0.2 0.0 - 0.5 K/uL   Basophils Relative 0 %   Basophils Absolute 0.0 0.0 - 0.1 K/uL   Immature Granulocytes 0 %   Abs Immature Granulocytes 0.03 0.00 - 0.07 K/uL  Cancer antigen 15-3  Result Value Ref Range   CA 15-3 11.8 0.0 - 25.0 U/mL  Comprehensive metabolic panel  Result Value Ref Range   Sodium 138 135 - 145 mmol/L   Potassium 3.7 3.5 - 5.1 mmol/L   Chloride 96 (L) 98 - 111 mmol/L   CO2 30 22 - 32 mmol/L   Glucose, Bld 86 70 - 99 mg/dL   BUN 16 8 - 23 mg/dL   Creatinine, Ser 0.94 0.44 - 1.00 mg/dL   Calcium 9.3 8.9 - 10.3 mg/dL   Total Protein 7.8 6.5 - 8.1 g/dL   Albumin 3.7 3.5 - 5.0 g/dL   AST 20 15 - 41 U/L   ALT 19 0 - 44 U/L   Alkaline Phosphatase 85 38 - 126 U/L   Total Bilirubin 0.2 (L) 0.3 - 1.2 mg/dL   GFR, Estimated >60 >60 mL/min   Anion gap 12 5 - 15      Assessment & Plan:   Problem List Items Addressed This Visit       Cardiovascular and Mediastinum   Paroxysmal atrial fibrillation (HCC)     Continue to follow with cardiology. Call with any concerns.       Relevant Medications  lisinopril-hydrochlorothiazide (ZESTORETIC) 20-25 MG tablet   metoprolol succinate (TOPROL-XL) 50 MG 24 hr tablet   EPINEPHrine 0.3 mg/0.3 mL IJ SOAJ injection   Benign essential hypertension - Primary    Under good control on current regimen. Continue current regimen. Continue to monitor. Call with any concerns. Refills given. Labs drawn today.        Relevant Medications   lisinopril-hydrochlorothiazide (ZESTORETIC) 20-25 MG tablet   metoprolol succinate (TOPROL-XL) 50 MG 24 hr tablet   EPINEPHrine 0.3 mg/0.3 mL IJ SOAJ injection   Other Relevant Orders   CBC with Differential/Platelet   Comprehensive metabolic panel     Endocrine   Thyroid activity decreased    Rechecking labs today. Await       Relevant Medications   metoprolol succinate (TOPROL-XL) 50 MG 24 hr tablet   Other Relevant Orders   CBC with Differential/Platelet   Comprehensive metabolic panel   TSH     Musculoskeletal and Integument   Acute gout involving toe of right foot    Under good control on current regimen. Continue current regimen. Continue to monitor. Call with any concerns. Refills given. Labs drawn today.        Relevant Medications   allopurinol (ZYLOPRIM) 300 MG tablet   Other Relevant Orders   CBC with Differential/Platelet   Comprehensive metabolic panel   Uric acid     Other   Combined fat and carbohydrate induced hyperlipemia    Rechecking labs today. Await results. Treat as needed.       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 Call with  any concerns.       Relevant Medications   allopurinol (ZYLOPRIM) 300 MG tablet   Hypokalemia    Rechecking labs today. Await results.       Relevant Orders   CBC with Differential/Platelet   Comprehensive metabolic panel   Other Visit Diagnoses     Needs flu shot       Flu shot given today.   Relevant Orders   Flu Vaccine QUAD High Dose(Fluad) (Completed)        Follow up plan: Return in about 6 months (around 03/04/2023).

## 2022-09-02 NOTE — Assessment & Plan Note (Signed)
Rechecking labs today. Await

## 2022-09-02 NOTE — Assessment & Plan Note (Signed)
Rechecking labs today. Await results. Treat as needed.  °

## 2022-09-02 NOTE — Assessment & Plan Note (Signed)
Continue to follow with oncology. Call with any concerns.  

## 2022-09-02 NOTE — Assessment & Plan Note (Signed)
Continue to follow with cardiology. Call with any concerns.  

## 2022-09-02 NOTE — Assessment & Plan Note (Signed)
Rechecking labs today. Await results.  

## 2022-09-03 LAB — LIPID PANEL W/O CHOL/HDL RATIO
Cholesterol, Total: 230 mg/dL — ABNORMAL HIGH (ref 100–199)
HDL: 55 mg/dL (ref >39)
LDL Chol Calc (NIH): 154 mg/dL — ABNORMAL HIGH (ref 0–99)
Triglycerides: 118 mg/dL (ref 0–149)
VLDL Cholesterol Cal: 21 mg/dL (ref 5–40)

## 2022-09-03 LAB — CBC WITH DIFFERENTIAL/PLATELET
Basophils Absolute: 0 10*3/uL (ref 0.0–0.2)
Basos: 0 %
EOS (ABSOLUTE): 0.3 10*3/uL (ref 0.0–0.4)
Eos: 3 %
Hematocrit: 37.6 % (ref 34.0–46.6)
Hemoglobin: 12 g/dL (ref 11.1–15.9)
Immature Grans (Abs): 0.1 10*3/uL (ref 0.0–0.1)
Immature Granulocytes: 1 %
Lymphocytes Absolute: 2 10*3/uL (ref 0.7–3.1)
Lymphs: 20 %
MCH: 27.5 pg (ref 26.6–33.0)
MCHC: 31.9 g/dL (ref 31.5–35.7)
MCV: 86 fL (ref 79–97)
Monocytes Absolute: 0.5 10*3/uL (ref 0.1–0.9)
Monocytes: 5 %
Neutrophils Absolute: 7.3 10*3/uL — ABNORMAL HIGH (ref 1.4–7.0)
Neutrophils: 71 %
Platelets: 349 10*3/uL (ref 150–450)
RBC: 4.37 x10E6/uL (ref 3.77–5.28)
RDW: 14.1 % (ref 11.7–15.4)
WBC: 10.1 10*3/uL (ref 3.4–10.8)

## 2022-09-03 LAB — COMPREHENSIVE METABOLIC PANEL
ALT: 20 IU/L (ref 0–32)
AST: 15 IU/L (ref 0–40)
Albumin/Globulin Ratio: 1.2 (ref 1.2–2.2)
Albumin: 3.6 g/dL — ABNORMAL LOW (ref 3.9–4.9)
Alkaline Phosphatase: 105 IU/L (ref 44–121)
BUN/Creatinine Ratio: 17 (ref 12–28)
BUN: 16 mg/dL (ref 8–27)
Bilirubin Total: 0.3 mg/dL (ref 0.0–1.2)
CO2: 26 mmol/L (ref 20–29)
Calcium: 9.4 mg/dL (ref 8.7–10.3)
Chloride: 97 mmol/L (ref 96–106)
Creatinine, Ser: 0.94 mg/dL (ref 0.57–1.00)
Globulin, Total: 2.9 g/dL (ref 1.5–4.5)
Glucose: 93 mg/dL (ref 70–99)
Potassium: 3.7 mmol/L (ref 3.5–5.2)
Sodium: 140 mmol/L (ref 134–144)
Total Protein: 6.5 g/dL (ref 6.0–8.5)
eGFR: 67 mL/min/{1.73_m2} (ref >59)

## 2022-09-03 LAB — TSH: TSH: 0.642 u[IU]/mL (ref 0.450–4.500)

## 2022-09-03 LAB — URIC ACID: Uric Acid: 5.9 mg/dL (ref 3.0–7.2)

## 2022-09-19 ENCOUNTER — Other Ambulatory Visit: Payer: Self-pay | Admitting: Family Medicine

## 2022-09-19 DIAGNOSIS — I10 Essential (primary) hypertension: Secondary | ICD-10-CM

## 2022-09-20 NOTE — Telephone Encounter (Signed)
Last refill doc. 06/12/22.  Requested Prescriptions  Pending Prescriptions Disp Refills   metoprolol succinate (TOPROL-XL) 50 MG 24 hr tablet [Pharmacy Med Name: Metoprolol Succinate ER 50 MG Oral Tablet Extended Release 24 Hour] 90 tablet 0    Sig: TAKE 1 TABLET BY MOUTH ONCE DAILY WITH OR IMMEDIATELY FOLLOWING A MEAL     Cardiovascular:  Beta Blockers Passed - 09/19/2022  9:17 AM      Passed - Last BP in normal range    BP Readings from Last 1 Encounters:  09/02/22 133/75         Passed - Last Heart Rate in normal range    Pulse Readings from Last 1 Encounters:  09/02/22 74         Passed - Valid encounter within last 6 months    Recent Outpatient Visits           2 weeks ago Benign essential hypertension   [facility] [place], Megan P, DO   6 months ago Benign essential hypertension   [facility] [place], [place], DO   1 year ago Pneumonia due to influenza A virus   [place], [place], DO   1 year ago Benign essential hypertension   [facility] [place], [place], DO   1 year ago [place] toe pain, right   [employer], [place], DO       Future Appointments             In 5 months Wynetta Emery, Barb Merino, DO [employer], [facility]

## 2022-09-21 ENCOUNTER — Other Ambulatory Visit: Payer: Self-pay | Admitting: Family Medicine

## 2022-09-22 NOTE — Telephone Encounter (Signed)
Refilled 09/02/22 1 with 12 refills.

## 2022-09-30 ENCOUNTER — Other Ambulatory Visit: Payer: Self-pay | Admitting: Family Medicine

## 2022-09-30 DIAGNOSIS — I10 Essential (primary) hypertension: Secondary | ICD-10-CM

## 2022-10-01 NOTE — Telephone Encounter (Signed)
Wendy Marsh called and spoke to [place], [facility] about the refill(s) metoprolol requested. Advised it was sent on 09/20/22 #90/0 refill(s). She says it was picked up on 09/21/22. Will refuse this request.  Requested Prescriptions  Pending Prescriptions Disp Refills   metoprolol succinate (TOPROL-XL) 50 MG 24 hr tablet [Pharmacy Med Name: Metoprolol Succinate ER 50 MG Oral Tablet Extended Release 24 Hour] 90 tablet 0    Sig: TAKE 1 TABLET BY MOUTH ONCE DAILY WITH OR IMMEDIATELY FOLLOWING A MEAL     Cardiovascular:  Beta Blockers Passed - 09/30/2022 10:30 AM      Passed - Last BP in normal range    BP Readings from Last 1 Encounters:  09/02/22 133/75         Passed - Last Heart Rate in normal range    Pulse Readings from Last 1 Encounters:  09/02/22 74         Passed - Valid encounter within last 6 months    Recent Outpatient Visits           4 weeks ago Benign essential hypertension   [facility] [place], Megan P, DO   7 months ago Benign essential hypertension   [facility] [place], [place], DO   1 year ago Pneumonia due to influenza A virus   [place], [place], DO   1 year ago Benign essential hypertension   [facility] [place], [place], DO   1 year ago [place] toe pain, right   [employer], [place], DO       Future Appointments             In 5 months Wynetta Emery, Barb Merino, DO [employer], [facility]

## 2022-10-03 HISTORY — PX: CATARACT EXTRACTION W/ INTRAOCULAR LENS  IMPLANT, BILATERAL: SHX1307

## 2022-11-10 ENCOUNTER — Inpatient Hospital Stay (HOSPITAL_BASED_OUTPATIENT_CLINIC_OR_DEPARTMENT_OTHER): Payer: Medicare Other | Admitting: Oncology

## 2022-11-10 ENCOUNTER — Inpatient Hospital Stay: Payer: Medicare Other | Attending: Oncology

## 2022-11-10 ENCOUNTER — Encounter: Payer: Self-pay | Admitting: Oncology

## 2022-11-10 VITALS — BP 166/65 | HR 65 | Temp 98.0°F | Resp 18 | Wt 312.0 lb

## 2022-11-10 DIAGNOSIS — Z171 Estrogen receptor negative status [ER-]: Secondary | ICD-10-CM

## 2022-11-10 DIAGNOSIS — D72828 Other elevated white blood cell count: Secondary | ICD-10-CM

## 2022-11-10 DIAGNOSIS — Z923 Personal history of irradiation: Secondary | ICD-10-CM | POA: Insufficient documentation

## 2022-11-10 DIAGNOSIS — C50511 Malignant neoplasm of lower-outer quadrant of right female breast: Secondary | ICD-10-CM

## 2022-11-10 DIAGNOSIS — Z9221 Personal history of antineoplastic chemotherapy: Secondary | ICD-10-CM | POA: Diagnosis not present

## 2022-11-10 DIAGNOSIS — D72829 Elevated white blood cell count, unspecified: Secondary | ICD-10-CM | POA: Insufficient documentation

## 2022-11-10 DIAGNOSIS — Z1231 Encounter for screening mammogram for malignant neoplasm of breast: Secondary | ICD-10-CM

## 2022-11-10 DIAGNOSIS — Z853 Personal history of malignant neoplasm of breast: Secondary | ICD-10-CM | POA: Diagnosis not present

## 2022-11-10 LAB — CBC WITH DIFFERENTIAL/PLATELET
Abs Immature Granulocytes: 0.05 10*3/uL (ref 0.00–0.07)
Basophils Absolute: 0 10*3/uL (ref 0.0–0.1)
Basophils Relative: 0 %
Eosinophils Absolute: 0.3 10*3/uL (ref 0.0–0.5)
Eosinophils Relative: 2 %
HCT: 39.5 % (ref 36.0–46.0)
Hemoglobin: 12.4 g/dL (ref 12.0–15.0)
Immature Granulocytes: 0 %
Lymphocytes Relative: 18 %
Lymphs Abs: 2 10*3/uL (ref 0.7–4.0)
MCH: 27.9 pg (ref 26.0–34.0)
MCHC: 31.4 g/dL (ref 30.0–36.0)
MCV: 88.8 fL (ref 80.0–100.0)
Monocytes Absolute: 0.8 10*3/uL (ref 0.1–1.0)
Monocytes Relative: 7 %
Neutro Abs: 8.1 10*3/uL — ABNORMAL HIGH (ref 1.7–7.7)
Neutrophils Relative %: 73 %
Platelets: 322 10*3/uL (ref 150–400)
RBC: 4.45 MIL/uL (ref 3.87–5.11)
RDW: 15.9 % — ABNORMAL HIGH (ref 11.5–15.5)
WBC: 11.3 10*3/uL — ABNORMAL HIGH (ref 4.0–10.5)
nRBC: 0 % (ref 0.0–0.2)

## 2022-11-10 LAB — COMPREHENSIVE METABOLIC PANEL
ALT: 19 U/L (ref 0–44)
AST: 24 U/L (ref 15–41)
Albumin: 3.8 g/dL (ref 3.5–5.0)
Alkaline Phosphatase: 90 U/L (ref 38–126)
Anion gap: 13 (ref 5–15)
BUN: 20 mg/dL (ref 8–23)
CO2: 27 mmol/L (ref 22–32)
Calcium: 9.1 mg/dL (ref 8.9–10.3)
Chloride: 98 mmol/L (ref 98–111)
Creatinine, Ser: 1 mg/dL (ref 0.44–1.00)
GFR, Estimated: >60 mL/min (ref >60)
Glucose, Bld: 88 mg/dL (ref 70–99)
Potassium: 3.8 mmol/L (ref 3.5–5.1)
Sodium: 138 mmol/L (ref 135–145)
Total Bilirubin: 0.3 mg/dL (ref 0.3–1.2)
Total Protein: 8.1 g/dL (ref 6.5–8.1)

## 2022-11-10 NOTE — Assessment & Plan Note (Addendum)
Etiology unknown, Possibly reactive, continue observation.

## 2022-11-10 NOTE — Assessment & Plan Note (Addendum)
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  s/p right lumpectomy with excisional biopsy of sentinel lymph node. S/p radiation.  Patient achieved complete pathological remission,  ypT0 ypN0 Clinically patient is doing very well.  She is 4 years after surgery. Labs were reviewed and discussed with patient.   Tumor markers are pending.  Continue observation and imaging surveillance- screening mammogram in August 2024

## 2022-11-10 NOTE — Progress Notes (Signed)
Pt here for follow up. No new concerns voiced. No new breast problems  

## 2022-11-10 NOTE — Progress Notes (Signed)
Hematology/Oncology Progress note [phone] [phone]   CHIEF COMPLAINTS Stage IIB left Triple negative breast cancer  ASSESSMENT & PLAN:   Cancer Staging  Malignant neoplasm of lower-outer quadrant of right female breast [facility]) Staging form: Breast, AJCC 8th Edition - Clinical stage from 05/08/2018: Stage IIB (cT2, cN0, cM0, G3, ER-, PR-, HER2-) - Signed by Earlie Server, MD on 05/08/2018 - Pathologic stage from 11/14/2018: No Stage Recommended (ypT0, pN0, cM0, ER-, PR-, HER2-) - Signed by Earlie Server, MD on 11/14/2018   Malignant neoplasm of lower-outer quadrant of right female breast [facility]) #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  s/p right lumpectomy with excisional biopsy of sentinel lymph node. S/p radiation.  Patient achieved complete pathological remission,  ypT0 ypN0 Clinically patient is doing very well.  She is 4 years after surgery. Labs were reviewed and discussed with patient.   Tumor markers are pending.  Continue observation and imaging surveillance- screening mammogram in August 2024   Leukocytosis Etiology unknown, Possibly reactive, continue observation.   Orders Placed This Encounter  Procedures   MM 3D SCREEN BREAST BILATERAL    Standing Status:   Future    Standing Expiration Date:   11/11/2023    Order Specific Question:   Reason for Exam (SYMPTOM  OR DIAGNOSIS REQUIRED)    Answer:   history of brest cancer    Order Specific Question:   Preferred imaging location?    Answer:   [facility]   Cancer antigen 15-3    Standing Status:   Future    Standing Expiration Date:   11/11/2023   Cancer antigen 27.29    Standing Status:   Future    Standing Expiration Date:   11/11/2023   CBC with Differential/Platelet    Standing Status:   Future    Standing Expiration Date:   11/11/2023   Comprehensive metabolic panel    Standing Status:   Future    Standing  Expiration Date:   11/10/2023   Follow up in 6 months.  All questions were answered. The patient knows to call the clinic with any problems, questions or concerns.  Earlie Server, MD, PhD [facility] Hematology Oncology 11/10/2022    HISTORY OF PRESENTING ILLNESS:  Wendy Marsh is a  67 y.o.  female follow-up of breast cancer.  04/28/2019 diagnostic mammogram Mammogram showed right breast 6 o'clock axis breast mass, 2.9 cm corresponding to area of concern.  cT2N0  Breast mass was biopsied, and pathology showed invasive mammary carcinoma.  Grade 3 , ER/PR- HER2 -.  LVI suspicious, favor present.   #  baseline MUGA testing done and reviewed systolic function depression, LVEF 45%. Given that Adriamycin has cardiac toxicity, potentially can lead to irreversible cardiomyopathy, I discussed with patient about using non-anthracycline-containing regimen. # 05/22/2018- 09/11/2018  S/p 6 cycle of Docetaxel '75mg'$ /m2 and Carboplatin every 3 weeks [ data from [place] et al 2018 Clin Cancer Res, Pathological Response and Survival in Triple-Negative Breast Cancer Following Neoadjuvant Carboplatin plus Docetaxel.Stage I-III TNBC patients, Neoadjuvant carboplatin (AUC6) plus docetaxel (75 mg/m2) every 21 days  6 cycles. pCR was 55% and Residual cancer burden (RCB) were 13%. Excellent Three-year RFS was 90% in patients with pCR and 66% in those without pCR]    # 10/29/2018   right breast lumpectomy with  excisional biopsy of the right axillary deep sentinel lymph nodes. Pathology showed CR  ypT0, ypN0- complete  pathological remission.   # # Family history of breast cancer plus personal history of triple negative breast cancer. Testing did not reveal a pathogenic mutation in any of the genes analyzed.   BRCA negative.  VUS was detected. Does not change management.   #Mediport was removed. 05/05/2022, bilateral diagnostic mammogram showed stable appearance of the left breast mass for greater  than 2 years. Consistent with a benign etiology. No mammographic evidence malignancy bilaterally.   INTERVAL HISTORY Wendy Marsh is a 67 y.o. female who has above history reviewed by me today presents for discussion of pathology and further management plan for triple negative breast cancer. Patient reports feeling well today.  No new complaints. She denies any breast concerns.   .. Review of Systems  Constitutional:  Negative for chills, fever, malaise/fatigue and weight loss.  HENT:  Negative for nosebleeds and sore throat.   Eyes:  Negative for double vision, photophobia and redness.  Respiratory:  Negative for cough, shortness of breath and wheezing.   Cardiovascular:  Negative for chest pain, palpitations, orthopnea and leg swelling.  Gastrointestinal:  Negative for abdominal pain, blood in stool, nausea and vomiting.  Genitourinary:  Negative for dysuria.  Musculoskeletal:  Positive for back pain and joint pain. Negative for myalgias and neck pain.       Chronic right knee pain.  Skin:  Negative for itching and rash.  Neurological:  Negative for dizziness, tingling and tremors.  Endo/Heme/Allergies:  Negative for environmental allergies. Does not bruise/bleed easily.  Psychiatric/Behavioral:  Negative for depression and hallucinations.     MEDICAL HISTORY:  Past Medical History:  Diagnosis Date   Arthritis    Atrial fibrillation ([place])    Breast cancer ([place]) 04/2018   right breast   Dysrhythmia    afib   Hyperlipidemia    Hypertension    Hypotension due to drugs 05/30/2018   Hypothyroidism    Lumbago    Osteoporosis    Personal history of chemotherapy    Personal history of radiation therapy    Thyroid disease     SURGICAL HISTORY: Past Surgical History:  Procedure Laterality Date   BREAST BIOPSY Right 05/02/2018   Invasive mammary carcinoma, grade 3, neo adj chemo   BREAST LUMPECTOMY Right 10/29/2018   NO RESIDUAL MALIGNANCY IDENTIFIED.    BREAST  LUMPECTOMY WITH NEEDLE LOCALIZATION AND AXILLARY SENTINEL LYMPH NODE BX Right 10/29/2018   Procedure: BREAST LUMPECTOMY WITH NEEDLE LOCALIZATION AND SENTINEL LYMPH NODE BX;  Surgeon: Vickie Epley, MD;  Location: [facility] ORS;  Service: General;  Laterality: Right;   JOINT REPLACEMENT Left    tkr   PORTACATH PLACEMENT Right 05/11/2018   Procedure: INSERTION  I.J PORT-A-CATH;  Surgeon: Robert Bellow, MD;  Location: [facility] ORS;  Service: General;  Laterality: Right;   REPLACEMENT TOTAL KNEE      SOCIAL HISTORY: Social History   Socioeconomic History   Marital status: Married    Spouse name: jerry   Number of children: 3   Years of education: Not on file   Highest education level: Some college, no degree  Occupational History   Not on file  Tobacco Use   Smoking status: Never   Smokeless tobacco: Never  Vaping Use   Vaping Use: Never used  Substance and Sexual Activity   Alcohol use: No   Drug use: No   Sexual activity: Yes    Birth control/protection: Post-menopausal  Other Topics Concern   Not on file  Social History Narrative  Not on file   Social Determinants of Health   Financial Resource Strain: Low Risk  (06/30/2022)   Overall Financial Resource Strain (CARDIA)    Difficulty of Paying Living Expenses: Not hard at all  Food Insecurity: No Food Insecurity (06/30/2022)   Hunger Vital Sign    Worried About Running Out of Food in the Last Year: Never true    Ran Out of Food in the Last Year: Never true  Transportation Needs: No Transportation Needs (06/30/2022)   PRAPARE - Hydrologist (Medical): No    Lack of Transportation (Non-Medical): No  Physical Activity: Inactive (06/30/2022)   Exercise Vital Sign    Days of Exercise per Week: 0 days    Minutes of Exercise per Session: 0 min  Stress: No Stress Concern Present (06/30/2022)   [place]    Feeling of Stress : Not at all   Social Connections: Moderately Isolated (06/30/2022)   Social Connection and Isolation Panel [NHANES]    Frequency of Communication with Friends and Family: Twice a week    Frequency of Social Gatherings with Friends and Family: Twice a week    Attends Religious Services: Never    Marine scientist or Organizations: No    Attends Archivist Meetings: Never    Marital Status: Married  Human resources officer Violence: Not At Risk (06/30/2022)   Humiliation, Afraid, Rape, and Kick questionnaire    Fear of Current or Ex-Partner: No    Emotionally Abused: No    Physically Abused: No    Sexually Abused: No    FAMILY HISTORY: Family History  Problem Relation Age of Onset   Diabetes Mother    Congestive Heart Failure Mother    Breast cancer Maternal Grandmother        dx 41s; deceased 5s   Cancer Maternal Uncle 41       deceased 73s   Other Father        No information on father or paternal relatives   Breast cancer Other 72       paternal half-sister; also esophageal ca at 30; currently 43   Diabetes Other    Hypertension Other    Kidney disease Other    Thyroid disease Other     ALLERGIES:  is allergic to gabapentin, morphine and related, peanut-containing drug products, and tape.  MEDICATIONS:  Current Outpatient Medications  Medication Sig Dispense Refill   acetaminophen (TYLENOL) 500 MG tablet Take 500 mg by mouth every 6 (six) hours as needed.     allopurinol (ZYLOPRIM) 300 MG tablet Take 1 tablet (300 mg total) by mouth daily. 90 tablet 1   apixaban (ELIQUIS) 5 MG TABS tablet Take 5 mg by mouth in the morning and at bedtime.     flecainide (TAMBOCOR) 100 MG tablet Take 100 mg by mouth 2 (two) times daily.     levothyroxine (SYNTHROID) 200 MCG tablet Take 1 tablet (200 mcg total) by mouth daily before breakfast. 90 tablet 3   lisinopril-hydrochlorothiazide (ZESTORETIC) 20-25 MG tablet Take 2 tablets by mouth daily. 180 tablet 1   metoprolol succinate (TOPROL-XL)  50 MG 24 hr tablet TAKE 1 TABLET BY MOUTH ONCE DAILY WITH OR IMMEDIATELY FOLLOWING A MEAL 90 tablet 0   EPINEPHrine 0.3 mg/0.3 mL IJ SOAJ injection Inject 0.3 mg into the muscle as needed for anaphylaxis. (Patient not taking: Reported on 11/10/2022) 1 each 12   No current facility-administered  medications for this visit.     PHYSICAL EXAMINATION: ECOG PERFORMANCE STATUS: 1 - Symptomatic but completely ambulatory Vitals:   11/10/22 1433  BP: (!) 166/65  Pulse: 65  Resp: 18  Temp: 98 F (36.7 C)   Filed Weights   11/10/22 1433  Weight: (!) 312 lb (141.5 kg)    Physical Exam Constitutional:      General: She is not in acute distress.    Appearance: She is obese.     Comments: Obese, walks with a walker  HENT:     Head: Normocephalic and atraumatic.  Eyes:     General: No scleral icterus.    Pupils: Pupils are equal, round, and reactive to light.  Cardiovascular:     Rate and Rhythm: Normal rate and regular rhythm.     Heart sounds: Normal heart sounds.  Pulmonary:     Effort: Pulmonary effort is normal. No respiratory distress.     Breath sounds: No wheezing.  Abdominal:     General: Bowel sounds are normal. There is no distension.     Palpations: Abdomen is soft. There is no mass.     Tenderness: There is no abdominal tenderness.  Musculoskeletal:        General: No deformity. Normal range of motion.     Cervical back: Normal range of motion.     Comments: Chronic lower extremity edema.  1+  Skin:    General: Skin is warm and dry.     Findings: No erythema or rash.  Neurological:     Mental Status: She is alert and oriented to person, place, and time. Mental status is at baseline.     Cranial Nerves: No cranial nerve deficit.     Coordination: Coordination normal.  Psychiatric:        Mood and Affect: Mood normal.   Bilateral breast examination was performed, no palpable mass in bilateral breasts, no palpable axillary lymphadenopathy bilatrally.    LABORATORY  DATA:  I have reviewed the data as listed    Latest Ref Rng & Units 11/10/2022    1:54 PM 09/02/2022   10:08 AM 05/10/2022    1:35 PM  CBC  WBC 4.0 - 10.5 K/uL 11.3  10.1  9.5   Hemoglobin 12.0 - 15.0 g/dL 12.4  12.0  12.4   Hematocrit 36.0 - 46.0 % 39.5  37.6  40.4   Platelets 150 - 400 K/uL 322  349  302       Latest Ref Rng & Units 11/10/2022    1:54 PM 09/02/2022   10:08 AM 05/10/2022    1:35 PM  CMP  Glucose 70 - 99 mg/dL 88  93  86   BUN 8 - 23 mg/dL '20  16  16   '$ Creatinine 0.44 - 1.00 mg/dL 1.00  0.94  0.94   Sodium 135 - 145 mmol/L 138  140  138   Potassium 3.5 - 5.1 mmol/L 3.8  3.7  3.7   Chloride 98 - 111 mmol/L 98  97  96   CO2 22 - 32 mmol/L '27  26  30   '$ Calcium 8.9 - 10.3 mg/dL 9.1  9.4  9.3   Total Protein 6.5 - 8.1 g/dL 8.1  6.5  7.8   Total Bilirubin 0.3 - 1.2 mg/dL 0.3  0.3  0.2   Alkaline Phos 38 - 126 U/L 90  105  85   AST 15 - 41 U/L '24  15  20   '$ ALT  0 - 44 U/L '19  20  19     '$ RADIOGRAPHIC STUDIES: I have personally reviewed the radiological images as listed and agreed with the findings in the report. NM Cardiac M[place] rest: Calculated LEFT ventricular ejection fraction equals 45%

## 2022-11-11 LAB — CANCER ANTIGEN 27.29: CA 27.29: <9 U/mL (ref 0.0–38.6)

## 2022-11-12 LAB — CANCER ANTIGEN 15-3: CA 15-3: 12.8 U/mL (ref 0.0–25.0)

## 2022-12-14 ENCOUNTER — Other Ambulatory Visit: Payer: Self-pay | Admitting: Family Medicine

## 2022-12-14 DIAGNOSIS — I10 Essential (primary) hypertension: Secondary | ICD-10-CM

## 2022-12-15 NOTE — Telephone Encounter (Signed)
Requested Prescriptions  Pending Prescriptions Disp Refills   metoprolol succinate (TOPROL-XL) 50 MG 24 hr tablet [Pharmacy Med Name: Metoprolol Succinate ER 50 MG Oral Tablet Extended Release 24 Hour] 90 tablet 0    Sig: TAKE 1 TABLET BY MOUTH ONCE DAILY WITH  OR  IMMEDIATELY  FOLLOWING  A  MEAL     Cardiovascular:  Beta Blockers Failed - 12/14/2022 12:25 PM      Failed - Last BP in normal range    BP Readings from Last 1 Encounters:  11/10/22 (!) 166/65         Passed - Last Heart Rate in normal range    Pulse Readings from Last 1 Encounters:  11/10/22 65         Passed - Valid encounter within last 6 months    Recent Outpatient Visits           3 months ago Benign essential hypertension   [place], Megan P, DO   9 months ago Benign essential hypertension   [place], [place] P, DO   1 year ago Pneumonia due to influenza A virus   [place], [place], DO   1 year ago Benign essential hypertension   [place], [place], DO   2 years ago Great toe pain, right   [place], DO       Future Appointments             In 2 months Wynetta Emery, Barb Merino, DO [place], [facility]

## 2022-12-17 ENCOUNTER — Other Ambulatory Visit: Payer: Self-pay | Admitting: Family Medicine

## 2022-12-17 DIAGNOSIS — I10 Essential (primary) hypertension: Secondary | ICD-10-CM

## 2022-12-19 ENCOUNTER — Telehealth: Payer: Self-pay | Admitting: Family Medicine

## 2022-12-19 NOTE — Telephone Encounter (Signed)
Requested Prescriptions  Pending Prescriptions Disp Refills   lisinopril-hydrochlorothiazide (ZESTORETIC) 20-25 MG tablet [Pharmacy Med Name: Lisinopril-hydroCHLOROthiazide 20-25 MG Oral Tablet] 180 tablet 0    Sig: Take 2 tablets by mouth once daily     Cardiovascular:  ACEI + Diuretic Combos Failed - 12/17/2022 10:32 AM      Failed - Last BP in normal range    BP Readings from Last 1 Encounters:  11/10/22 (!) 166/65         Passed - Na in normal range and within 180 days    Sodium  Date Value Ref Range Status  11/10/2022 138 135 - 145 mmol/L Final  09/02/2022 140 134 - 144 mmol/L Final         Passed - K in normal range and within 180 days    Potassium  Date Value Ref Range Status  11/10/2022 3.8 3.5 - 5.1 mmol/L Final         Passed - Cr in normal range and within 180 days    Creatinine, Ser  Date Value Ref Range Status  11/10/2022 1.00 0.44 - 1.00 mg/dL Final         Passed - eGFR is 30 or above and within 180 days    GFR calc Af Amer  Date Value Ref Range Status  06/16/2020 84 >59 mL/min/1.73 Final    Comment:    **[facility] currently reports eGFR in compliance with the current**   recommendations of the [employer]. [facility] will   update reporting as new guidelines are published from the NKF-ASN   Task force.    GFR, Estimated  Date Value Ref Range Status  11/10/2022 >60 >60 mL/min Final    Comment:    (NOTE) Calculated using the CKD-EPI Creatinine Equation (2021)    eGFR  Date Value Ref Range Status  09/02/2022 67 >59 mL/min/1.73 Final         Passed - Patient is not pregnant      Passed - Valid encounter within last 6 months    Recent Outpatient Visits           3 months ago Benign essential hypertension   [place], [place], DO   9 months ago Benign essential hypertension   [place], [place], DO   1 year ago Pneumonia due to influenza A virus   [place], [place], DO   1 year ago Benign essential hypertension   [place], [place], DO   2 years ago Great toe pain, right   [place], DO       Future Appointments             In 2 months Wynetta Emery, Barb Merino, DO [place], [facility]

## 2022-12-19 NOTE — Telephone Encounter (Signed)
Patient called for a refill of lisinopril-hydrochlorothiazide (ZESTORETIC) 20-25 MG tablet [id number] . Advised patient that she had refills at [facility] [place] on [address]. [place] to verify additional refills. Patient has a new Rx good until 12/24. Called patient back to let her know that [facility] is filling her prescription, patient verbal understanding.

## 2022-12-27 ENCOUNTER — Telehealth: Payer: Self-pay | Admitting: Family Medicine

## 2022-12-27 NOTE — Telephone Encounter (Unsigned)
Copied from [place] [phone]. Topic: General - Other >> Dec 27, 2022  1:44 PM Ja-Kwan M wrote: Reason for CRM: Pt requests a letter to relieve her from jury duty due to her health condition

## 2022-12-29 ENCOUNTER — Encounter: Payer: Self-pay | Admitting: Family Medicine

## 2022-12-29 NOTE — Telephone Encounter (Signed)
Patient was notified and verbalized understanding.

## 2022-12-29 NOTE — Telephone Encounter (Signed)
Letter on her chart.

## 2023-01-05 ENCOUNTER — Other Ambulatory Visit: Payer: Self-pay | Admitting: Family Medicine

## 2023-01-05 NOTE — Telephone Encounter (Signed)
Requested Prescriptions  Pending Prescriptions Disp Refills   allopurinol (ZYLOPRIM) 300 MG tablet [Pharmacy Med Name: Allopurinol 300 MG Oral Tablet] 90 tablet 0    Sig: Take 1 tablet by mouth once daily     Endocrinology:  Gout Agents - allopurinol Passed - 01/05/2023  6:51 AM      Passed - Uric Acid in normal range and within 360 days    Uric Acid  Date Value Ref Range Status  09/02/2022 5.9 3.0 - 7.2 mg/dL Final    Comment:               Therapeutic target for gout patients: <6.0         Passed - Cr in normal range and within 360 days    Creatinine, Ser  Date Value Ref Range Status  11/10/2022 1.00 0.44 - 1.00 mg/dL Final         Passed - Valid encounter within last 12 months    Recent Outpatient Visits           4 months ago Benign essential hypertension   [place] [facility] [place], Megan P, DO   10 months ago Benign essential hypertension   [place], [place] P, DO   1 year ago Pneumonia due to influenza A virus   [place], [place], DO   1 year ago Benign essential hypertension   [place] [facility] [place], [place], DO   2 years ago Great toe pain, right   [place] [facility] [place], [place], DO       Future Appointments             In 2 months [place], Megan P, DO [place], [facility]            Passed - CBC within normal limits and completed in the last 12 months    WBC  Date Value Ref Range Status  11/10/2022 11.3 (H) 4.0 - 10.5 K/uL Final   RBC  Date Value Ref Range Status  11/10/2022 4.45 3.87 - 5.11 MIL/uL Final   Hemoglobin  Date Value Ref Range Status  11/10/2022 12.4 12.0 - 15.0 g/dL Final  09/02/2022 12.0 11.1 - 15.9 g/dL Final   HCT  Date Value Ref Range Status  11/10/2022 39.5 36.0 - 46.0 % Final   Hematocrit  Date Value Ref Range Status  09/02/2022 37.6 34.0 - 46.6 % Final   MCHC  Date  Value Ref Range Status  11/10/2022 31.4 30.0 - 36.0 g/dL Final   [facility]  Date Value Ref Range Status  11/10/2022 27.9 26.0 - 34.0 pg Final   MCV  Date Value Ref Range Status  11/10/2022 88.8 80.0 - 100.0 fL Final  09/02/2022 86 79 - 97 fL Final   No results found for: "PLTCOUNTKUC", "LABPLAT", "POCPLA" RDW  Date Value Ref Range Status  11/10/2022 15.9 (H) 11.5 - 15.5 % Final  09/02/2022 14.1 11.7 - 15.4 % Final

## 2023-03-06 ENCOUNTER — Ambulatory Visit (INDEPENDENT_AMBULATORY_CARE_PROVIDER_SITE_OTHER): Payer: Medicare Other | Admitting: Family Medicine

## 2023-03-06 ENCOUNTER — Encounter: Payer: Self-pay | Admitting: Family Medicine

## 2023-03-06 VITALS — BP 132/62 | HR 67 | Temp 98.0°F | Wt 315.4 lb

## 2023-03-06 DIAGNOSIS — M109 Gout, unspecified: Secondary | ICD-10-CM

## 2023-03-06 DIAGNOSIS — I10 Essential (primary) hypertension: Secondary | ICD-10-CM

## 2023-03-06 DIAGNOSIS — Z17421 Hormone receptor negative with human epidermal growth factor receptor 2 negative status: Secondary | ICD-10-CM

## 2023-03-06 DIAGNOSIS — E039 Hypothyroidism, unspecified: Secondary | ICD-10-CM | POA: Diagnosis not present

## 2023-03-06 DIAGNOSIS — R42 Dizziness and giddiness: Secondary | ICD-10-CM

## 2023-03-06 DIAGNOSIS — C50919 Malignant neoplasm of unspecified site of unspecified female breast: Secondary | ICD-10-CM

## 2023-03-06 DIAGNOSIS — E782 Mixed hyperlipidemia: Secondary | ICD-10-CM | POA: Diagnosis not present

## 2023-03-06 DIAGNOSIS — I480 Paroxysmal atrial fibrillation: Secondary | ICD-10-CM

## 2023-03-06 DIAGNOSIS — R441 Visual hallucinations: Secondary | ICD-10-CM

## 2023-03-06 MED ORDER — ALLOPURINOL 300 MG PO TABS: 300.0000 mg | ORAL_TABLET | Freq: Every day | ORAL | 1 refills | Status: DC

## 2023-03-06 MED ORDER — LISINOPRIL-HYDROCHLOROTHIAZIDE 20-25 MG PO TABS: 2.0000 | ORAL_TABLET | Freq: Every day | ORAL | 1 refills | Status: DC

## 2023-03-06 MED ORDER — ALPRAZOLAM 0.5 MG PO TABS: ORAL_TABLET | ORAL | 0 refills | Status: DC

## 2023-03-06 MED ORDER — METOPROLOL SUCCINATE ER 50 MG PO TB24: ORAL_TABLET | ORAL | 1 refills | Status: DC

## 2023-03-06 NOTE — Progress Notes (Signed)
BP 132/62   Pulse 67   Temp 98 F (36.7 C) (Oral)   Wt (!) 315 lb 6.4 oz (143.1 kg)   LMP 03/17/2009 (Approximate)   SpO2 96%   BMI 58.44 kg/m    Subjective:    Patient ID: Wendy Marsh, female    DOB: [birth year], 67 y.o.   MRN: [id number]  HPI: Wendy Marsh is a 67 y.o. female  Chief Complaint  Patient presents with   Hypothyroidism   Hypertension   Elsy notes that she has been seeing some funny things at night. She can't lay on her R side because of pretty severe vertigo. She has been seeing a rectangle floating before her eyes- she can push her hand through it and then can't see her hand behind it. She had a conversation with her husband about it- and he confirmed he couldn't see it and that she was not asleep. She notes that this has happed 2-3x, always at night. She is not sure what's going on with it and it's concerning her.  HYPOTHYROIDISM Thyroid control status:controlled Satisfied with current treatment? yes Medication side effects: no Medication compliance: excellent compliance Etiology of hypothyroidism:  Recent dose adjustment:no Fatigue: no Cold intolerance: yes Heat intolerance: no Weight gain: no Weight loss: no Constipation: no Diarrhea/loose stools: no Palpitations: no Lower extremity edema: no Anxiety/depressed mood: no  No gout flares. Tolerating medicine OK.   HYPERTENSION / HYPERLIPIDEMIA Satisfied with current treatment? yes Duration of hypertension: chronic BP monitoring frequency: rarely BP medication side effects: no Past BP meds: lisinopril-HCTZ, metoprolol Duration of hyperlipidemia: chronic Cholesterol medication side effects: no Cholesterol supplements: none Past cholesterol medications: none Medication compliance: excellent compliance Aspirin: no Recent stressors: no Recurrent headaches: no Visual changes: no Palpitations: no Dyspnea: no Chest pain: no Lower extremity edema: no Dizzy/lightheaded:  no   Relevant past medical, surgical, family and social history reviewed and updated as indicated. Interim medical history since our last visit reviewed. Allergies and medications reviewed and updated.  Review of Systems  Constitutional: Negative.   Respiratory: Negative.    Cardiovascular: Negative.   Gastrointestinal: Negative.   Musculoskeletal: Negative.   Psychiatric/Behavioral:  Positive for hallucinations and sleep disturbance. Negative for agitation, behavioral problems, confusion, decreased concentration, dysphoric mood, self-injury and suicidal ideas. The patient is not nervous/anxious and is not hyperactive.     Per HPI unless specifically indicated above     Objective:    BP 132/62   Pulse 67   Temp 98 F (36.7 C) (Oral)   Wt (!) 315 lb 6.4 oz (143.1 kg)   LMP 03/17/2009 (Approximate)   SpO2 96%   BMI 58.44 kg/m   Wt Readings from Last 3 Encounters:  03/06/23 (!) 315 lb 6.4 oz (143.1 kg)  11/10/22 (!) 312 lb (141.5 kg)  09/02/22 (!) 311 lb 11.2 oz (141.4 kg)    Physical Exam Vitals and nursing note reviewed.  Constitutional:      General: She is not in acute distress.    Appearance: Normal appearance. She is obese. She is not ill-appearing, toxic-appearing or diaphoretic.  HENT:     Head: Normocephalic and atraumatic.     Right Ear: External ear normal.     Left Ear: External ear normal.     Nose: Nose normal.     Mouth/Throat:     Mouth: Mucous membranes are moist.     Pharynx: Oropharynx is clear.  Eyes:     General: No scleral icterus.  Right eye: No discharge.        Left eye: No discharge.     Extraocular Movements: Extraocular movements intact.     Conjunctiva/sclera: Conjunctivae normal.     Pupils: Pupils are equal, round, and reactive to light.  Cardiovascular:     Rate and Rhythm: Normal rate and regular rhythm.     Pulses: Normal pulses.     Heart sounds: Normal heart sounds. No murmur heard.    No friction rub. No gallop.   Pulmonary:     Effort: Pulmonary effort is normal. No respiratory distress.     Breath sounds: Normal breath sounds. No stridor. No wheezing, rhonchi or rales.  Chest:     Chest wall: No tenderness.  Musculoskeletal:        General: Normal range of motion.     Cervical back: Normal range of motion and neck supple.  Skin:    General: Skin is warm and dry.     Capillary Refill: Capillary refill takes less than 2 seconds.     Coloration: Skin is not jaundiced or pale.     Findings: No bruising, erythema, lesion or rash.  Neurological:     General: No focal deficit present.     Mental Status: She is alert and oriented to person, place, and time. Mental status is at baseline.  Psychiatric:        Mood and Affect: Mood normal.        Behavior: Behavior normal.        Thought Content: Thought content normal.        Judgment: Judgment normal.     Results for orders placed or performed in visit on 11/10/22  Comprehensive metabolic panel  Result Value Ref Range   Sodium 138 135 - 145 mmol/L   Potassium 3.8 3.5 - 5.1 mmol/L   Chloride 98 98 - 111 mmol/L   CO2 27 22 - 32 mmol/L   Glucose, Bld 88 70 - 99 mg/dL   BUN 20 8 - 23 mg/dL   Creatinine, Ser 1.61 0.44 - 1.00 mg/dL   Calcium 9.1 8.9 - 09.6 mg/dL   Total Protein 8.1 6.5 - 8.1 g/dL   Albumin 3.8 3.5 - 5.0 g/dL   AST 24 15 - 41 U/L   ALT 19 0 - 44 U/L   Alkaline Phosphatase 90 38 - 126 U/L   Total Bilirubin 0.3 0.3 - 1.2 mg/dL   GFR, Estimated >04 >54 mL/min   Anion gap 13 5 - 15  CBC with Differential/Platelet  Result Value Ref Range   WBC 11.3 (H) 4.0 - 10.5 K/uL   RBC 4.45 3.87 - 5.11 MIL/uL   Hemoglobin 12.4 12.0 - 15.0 g/dL   HCT 09.8 11.9 - 14.7 %   MCV 88.8 80.0 - 100.0 fL   MCH 27.9 26.0 - 34.0 pg   MCHC 31.4 30.0 - 36.0 g/dL   RDW 82.9 (H) 56.2 - 13.0 %   Platelets 322 150 - 400 K/uL   nRBC 0.0 0.0 - 0.2 %   Neutrophils Relative % 73 %   Neutro Abs 8.1 (H) 1.7 - 7.7 K/uL   Lymphocytes Relative 18 %   Lymphs  Abs 2.0 0.7 - 4.0 K/uL   Monocytes Relative 7 %   Monocytes Absolute 0.8 0.1 - 1.0 K/uL   Eosinophils Relative 2 %   Eosinophils Absolute 0.3 0.0 - 0.5 K/uL   Basophils Relative 0 %   Basophils Absolute 0.0 0.0 - 0.1  K/uL   Immature Granulocytes 0 %   Abs Immature Granulocytes 0.05 0.00 - 0.07 K/uL  Cancer antigen 27.29  Result Value Ref Range   CA 27.29 <9.0 0.0 - 38.6 U/mL  Cancer antigen 15-3  Result Value Ref Range   CA 15-3 12.8 0.0 - 25.0 U/mL      Assessment & Plan:   Problem List Items Addressed This Visit       Cardiovascular and Mediastinum   Paroxysmal atrial fibrillation (HCC)    Stable. Continue to follow with cardiology. Call with any concerns.       Relevant Medications   lisinopril-hydrochlorothiazide (ZESTORETIC) 20-25 MG tablet   metoprolol succinate (TOPROL-XL) 50 MG 24 hr tablet   Benign essential hypertension - Primary    Under good control on current regimen. Continue current regimen. Continue to monitor. Call with any concerns. Refills given. Labs drawn today.        Relevant Medications   lisinopril-hydrochlorothiazide (ZESTORETIC) 20-25 MG tablet   metoprolol succinate (TOPROL-XL) 50 MG 24 hr tablet   Other Relevant Orders   CBC with Differential/Platelet   Comprehensive metabolic panel   Microalbumin, Urine Waived     Endocrine   Thyroid activity decreased    Rechecking labs today. Await results. Treat as needed.       Relevant Medications   metoprolol succinate (TOPROL-XL) 50 MG 24 hr tablet   Other Relevant Orders   CBC with Differential/Platelet   Comprehensive metabolic panel     Musculoskeletal and Integument   Acute gout involving toe of right foot    Under good control on current regimen. Continue current regimen. Continue to monitor. Call with any concerns. Refills given. Labs drawn today.        Relevant Medications   allopurinol (ZYLOPRIM) 300 MG tablet   Other Relevant Orders   CBC with Differential/Platelet    Comprehensive metabolic panel   Uric acid     Other   Combined fat and carbohydrate induced hyperlipemia    Under good control on current regimen. Continue current regimen. Continue to monitor. Call with any concerns. Refills given. Labs drawn today.       Relevant Medications   lisinopril-hydrochlorothiazide (ZESTORETIC) 20-25 MG tablet   metoprolol succinate (TOPROL-XL) 50 MG 24 hr tablet   Other Relevant Orders   CBC with Differential/Platelet   Comprehensive metabolic panel   Lipid Panel w/o Chol/HDL Ratio   Morbid obesity (HCC)    Encouraged diet and exercise with goal of losing 1-2lbs per week. Continue to monitor.       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 Stable. Given vertigo and hallucinations, will check MRI of head to look for mets vs acoustic neuroma.      Relevant Medications   allopurinol (ZYLOPRIM) 300 MG tablet   ALPRAZolam (XANAX) 0.5 MG tablet   Other Relevant Orders   MR Brain W Wo Contrast   Other Visit Diagnoses     Visual hallucinations       Relevant Orders   MR Brain W Wo Contrast   Vertigo       Relevant Orders   MR Brain W Wo Contrast        Follow up plan: Return in about 6 months (around 09/05/2023).

## 2023-03-06 NOTE — Assessment & Plan Note (Signed)
Under good control on current regimen. Continue current regimen. Continue to monitor. Call with any concerns. Refills given. Labs drawn today.   

## 2023-03-06 NOTE — Assessment & Plan Note (Signed)
Encouraged diet and exercise with goal of losing 1-2 lbs per week. Continue to monitor.  

## 2023-03-06 NOTE — Assessment & Plan Note (Signed)
Stable. Continue to follow with cardiology. Call with any concerns.  

## 2023-03-06 NOTE — Assessment & Plan Note (Signed)
Continue to follow with oncology. Stable. Given vertigo and hallucinations, will check MRI of head to look for mets vs acoustic neuroma.

## 2023-03-06 NOTE — Assessment & Plan Note (Signed)
Rechecking labs today. Await results. Treat as needed.  °

## 2023-03-07 LAB — COMPREHENSIVE METABOLIC PANEL
ALT: 14 IU/L (ref 0–32)
AST: 15 IU/L (ref 0–40)
Albumin/Globulin Ratio: 1.2 (ref 1.2–2.2)
Albumin: 3.7 g/dL — ABNORMAL LOW (ref 3.9–4.9)
Alkaline Phosphatase: 96 IU/L (ref 44–121)
BUN/Creatinine Ratio: 23 (ref 12–28)
BUN: 21 mg/dL (ref 8–27)
Bilirubin Total: 0.2 mg/dL (ref 0.0–1.2)
CO2: 25 mmol/L (ref 20–29)
Calcium: 9.4 mg/dL (ref 8.7–10.3)
Chloride: 98 mmol/L (ref 96–106)
Creatinine, Ser: 0.93 mg/dL (ref 0.57–1.00)
Globulin, Total: 3.1 g/dL (ref 1.5–4.5)
Glucose: 99 mg/dL (ref 70–99)
Potassium: 3.5 mmol/L (ref 3.5–5.2)
Sodium: 143 mmol/L (ref 134–144)
Total Protein: 6.8 g/dL (ref 6.0–8.5)
eGFR: 67 mL/min/{1.73_m2} (ref >59)

## 2023-03-07 LAB — CBC WITH DIFFERENTIAL/PLATELET
Basophils Absolute: 0 10*3/uL (ref 0.0–0.2)
Basos: 0 %
EOS (ABSOLUTE): 0.2 10*3/uL (ref 0.0–0.4)
Eos: 2 %
Hematocrit: 37.5 % (ref 34.0–46.6)
Hemoglobin: 11.7 g/dL (ref 11.1–15.9)
Immature Grans (Abs): 0 10*3/uL (ref 0.0–0.1)
Immature Granulocytes: 0 %
Lymphocytes Absolute: 1.8 10*3/uL (ref 0.7–3.1)
Lymphs: 17 %
MCH: 26.8 pg (ref 26.6–33.0)
MCHC: 31.2 g/dL — ABNORMAL LOW (ref 31.5–35.7)
MCV: 86 fL (ref 79–97)
Monocytes Absolute: 0.5 10*3/uL (ref 0.1–0.9)
Monocytes: 5 %
Neutrophils Absolute: 8.1 10*3/uL — ABNORMAL HIGH (ref 1.4–7.0)
Neutrophils: 76 %
Platelets: 329 10*3/uL (ref 150–450)
RBC: 4.36 x10E6/uL (ref 3.77–5.28)
RDW: 14.8 % (ref 11.7–15.4)
WBC: 10.7 10*3/uL (ref 3.4–10.8)

## 2023-03-07 LAB — LIPID PANEL W/O CHOL/HDL RATIO
Cholesterol, Total: 216 mg/dL — ABNORMAL HIGH (ref 100–199)
HDL: 54 mg/dL (ref >39)
LDL Chol Calc (NIH): 137 mg/dL — ABNORMAL HIGH (ref 0–99)
Triglycerides: 138 mg/dL (ref 0–149)
VLDL Cholesterol Cal: 25 mg/dL (ref 5–40)

## 2023-03-07 LAB — URIC ACID: Uric Acid: 5.6 mg/dL (ref 3.0–7.2)

## 2023-03-10 ENCOUNTER — Ambulatory Visit
Admission: RE | Admit: 2023-03-10 | Discharge: 2023-03-10 | Disposition: A | Discharge status: Home / Self Care | Payer: Medicare Other | Source: Ambulatory Visit | Attending: Family Medicine | Admitting: Family Medicine

## 2023-03-10 DIAGNOSIS — R441 Visual hallucinations: Secondary | ICD-10-CM | POA: Insufficient documentation

## 2023-03-10 DIAGNOSIS — C50919 Malignant neoplasm of unspecified site of unspecified female breast: Secondary | ICD-10-CM | POA: Diagnosis present

## 2023-03-10 DIAGNOSIS — R42 Dizziness and giddiness: Secondary | ICD-10-CM | POA: Insufficient documentation

## 2023-03-10 MED ORDER — GADOBUTROL 1 MMOL/ML IV SOLN
10.0000 mL | Freq: Once | INTRAVENOUS | Status: AC | PRN
  Administered 2023-03-10: 10 mL via INTRAVENOUS

## 2023-03-20 ENCOUNTER — Encounter: Payer: Self-pay | Admitting: Family Medicine

## 2023-03-30 NOTE — Telephone Encounter (Signed)
Can you see if she can do a virtual visit this PM to talk about options?

## 2023-03-30 NOTE — Telephone Encounter (Signed)
Called and spoke with patient she stated that she feels better about her concerns now.  She had been researching it and also she has an upcoming appointment with her oncologist.  She says she feels comfortable with it now that there wasn't anything else that could be done was a MRI and she has done that.  Stated she would reach back out if she needs to.

## 2023-04-09 ENCOUNTER — Other Ambulatory Visit: Payer: Self-pay | Admitting: Family Medicine

## 2023-04-10 NOTE — Telephone Encounter (Signed)
Requested Prescriptions  Refused Prescriptions Disp Refills   allopurinol (ZYLOPRIM) 300 MG tablet [Pharmacy Med Name: Allopurinol 300 MG Oral Tablet] 90 tablet 0    Sig: Take 1 tablet by mouth once daily     Endocrinology:  Gout Agents - allopurinol Passed - 04/09/2023  7:51 AM      Passed - Uric Acid in normal range and within 360 days    Uric Acid  Date Value Ref Range Status  03/06/2023 5.6 3.0 - 7.2 mg/dL Final    Comment:               Therapeutic target for gout patients: <6.0         Passed - Cr in normal range and within 360 days    Creatinine, Ser  Date Value Ref Range Status  03/06/2023 0.93 0.57 - 1.00 mg/dL Final         Passed - Valid encounter within last 12 months    Recent Outpatient Visits           1 month ago Benign essential hypertension   [facility] [place], Megan P, DO   7 months ago Benign essential hypertension   [place] [facility] [place], Megan P, DO   1 year ago Benign essential hypertension   [place] [facility] [place], Megan P, DO   1 year ago Pneumonia due to influenza A virus   [place] [facility] [place], Harris, DO   2 years ago Benign essential hypertension    [facility] [place], [place], DO       Future Appointments             In 4 months Johnson, Oralia Rud, DO  [facility]            Passed - CBC within normal limits and completed in the last 12 months    WBC  Date Value Ref Range Status  03/06/2023 10.7 3.4 - 10.8 x10E3/uL Final  11/10/2022 11.3 (H) 4.0 - 10.5 K/uL Final   RBC  Date Value Ref Range Status  03/06/2023 4.36 3.77 - 5.28 x10E6/uL Final  11/10/2022 4.45 3.87 - 5.11 MIL/uL Final   Hemoglobin  Date Value Ref Range Status  03/06/2023 11.7 11.1 - 15.9 g/dL Final   Hematocrit  Date Value Ref Range Status  03/06/2023 37.5 34.0 - 46.6 % Final   MCHC  Date Value Ref Range Status   03/06/2023 31.2 (L) 31.5 - 35.7 g/dL Final  78/29/5621 30.8 30.0 - 36.0 g/dL Final   [facility]  Date Value Ref Range Status  03/06/2023 26.8 26.6 - 33.0 pg Final  11/10/2022 27.9 26.0 - 34.0 pg Final   MCV  Date Value Ref Range Status  03/06/2023 86 79 - 97 fL Final   No results found for: "PLTCOUNTKUC", "LABPLAT", "POCPLA" RDW  Date Value Ref Range Status  03/06/2023 14.8 11.7 - 15.4 % Final

## 2023-05-08 ENCOUNTER — Ambulatory Visit
Admission: RE | Admit: 2023-05-08 | Discharge: 2023-05-08 | Disposition: A | Discharge status: Home / Self Care | Payer: Medicare Other | Source: Ambulatory Visit | Attending: Oncology | Admitting: Oncology

## 2023-05-08 DIAGNOSIS — C50511 Malignant neoplasm of lower-outer quadrant of right female breast: Secondary | ICD-10-CM | POA: Insufficient documentation

## 2023-05-08 DIAGNOSIS — Z1231 Encounter for screening mammogram for malignant neoplasm of breast: Secondary | ICD-10-CM | POA: Diagnosis present

## 2023-05-08 DIAGNOSIS — Z171 Estrogen receptor negative status [ER-]: Secondary | ICD-10-CM | POA: Insufficient documentation

## 2023-05-09 ENCOUNTER — Other Ambulatory Visit: Payer: Self-pay | Admitting: Oncology

## 2023-05-09 DIAGNOSIS — N6489 Other specified disorders of breast: Secondary | ICD-10-CM

## 2023-05-09 DIAGNOSIS — R928 Other abnormal and inconclusive findings on diagnostic imaging of breast: Secondary | ICD-10-CM

## 2023-05-10 ENCOUNTER — Ambulatory Visit
Admission: RE | Admit: 2023-05-10 | Discharge: 2023-05-10 | Disposition: A | Discharge status: Home / Self Care | Payer: Medicare Other | Source: Ambulatory Visit | Attending: Oncology | Admitting: Oncology

## 2023-05-10 DIAGNOSIS — N6489 Other specified disorders of breast: Secondary | ICD-10-CM

## 2023-05-10 DIAGNOSIS — R928 Other abnormal and inconclusive findings on diagnostic imaging of breast: Secondary | ICD-10-CM | POA: Insufficient documentation

## 2023-05-15 ENCOUNTER — Inpatient Hospital Stay: Payer: Medicare Other | Attending: Oncology | Admitting: Oncology

## 2023-05-15 ENCOUNTER — Inpatient Hospital Stay: Payer: Medicare Other

## 2023-05-15 ENCOUNTER — Encounter: Payer: Self-pay | Admitting: Oncology

## 2023-05-15 VITALS — BP 167/61 | HR 61 | Temp 96.7°F | Resp 18 | Wt 312.6 lb

## 2023-05-15 DIAGNOSIS — R441 Visual hallucinations: Secondary | ICD-10-CM | POA: Insufficient documentation

## 2023-05-15 DIAGNOSIS — Z171 Estrogen receptor negative status [ER-]: Secondary | ICD-10-CM

## 2023-05-15 DIAGNOSIS — R440 Auditory hallucinations: Secondary | ICD-10-CM | POA: Diagnosis not present

## 2023-05-15 DIAGNOSIS — C50511 Malignant neoplasm of lower-outer quadrant of right female breast: Secondary | ICD-10-CM

## 2023-05-15 DIAGNOSIS — Z08 Encounter for follow-up examination after completed treatment for malignant neoplasm: Secondary | ICD-10-CM | POA: Diagnosis present

## 2023-05-15 DIAGNOSIS — R443 Hallucinations, unspecified: Secondary | ICD-10-CM

## 2023-05-15 DIAGNOSIS — Z853 Personal history of malignant neoplasm of breast: Secondary | ICD-10-CM | POA: Diagnosis not present

## 2023-05-15 LAB — CBC WITH DIFFERENTIAL/PLATELET
Abs Immature Granulocytes: 0.07 10*3/uL (ref 0.00–0.07)
Basophils Absolute: 0 10*3/uL (ref 0.0–0.1)
Basophils Relative: 0 %
Eosinophils Absolute: 0.3 10*3/uL (ref 0.0–0.5)
Eosinophils Relative: 3 %
HCT: 40.4 % (ref 36.0–46.0)
Hemoglobin: 12.4 g/dL (ref 12.0–15.0)
Immature Granulocytes: 1 %
Lymphocytes Relative: 19 %
Lymphs Abs: 1.8 10*3/uL (ref 0.7–4.0)
MCH: 27.6 pg (ref 26.0–34.0)
MCHC: 30.7 g/dL (ref 30.0–36.0)
MCV: 90 fL (ref 80.0–100.0)
Monocytes Absolute: 0.7 10*3/uL (ref 0.1–1.0)
Monocytes Relative: 8 %
Neutro Abs: 6.7 10*3/uL (ref 1.7–7.7)
Neutrophils Relative %: 69 %
Platelets: 307 10*3/uL (ref 150–400)
RBC: 4.49 MIL/uL (ref 3.87–5.11)
RDW: 16.5 % — ABNORMAL HIGH (ref 11.5–15.5)
WBC: 9.7 10*3/uL (ref 4.0–10.5)
nRBC: 0 % (ref 0.0–0.2)

## 2023-05-15 LAB — COMPREHENSIVE METABOLIC PANEL
ALT: 15 U/L (ref 0–44)
AST: 16 U/L (ref 15–41)
Albumin: 3.7 g/dL (ref 3.5–5.0)
Alkaline Phosphatase: 79 U/L (ref 38–126)
Anion gap: 13 (ref 5–15)
BUN: 17 mg/dL (ref 8–23)
CO2: 29 mmol/L (ref 22–32)
Calcium: 9.2 mg/dL (ref 8.9–10.3)
Chloride: 95 mmol/L — ABNORMAL LOW (ref 98–111)
Creatinine, Ser: 0.95 mg/dL (ref 0.44–1.00)
GFR, Estimated: >60 mL/min (ref >60)
Glucose, Bld: 82 mg/dL (ref 70–99)
Potassium: 3.5 mmol/L (ref 3.5–5.1)
Sodium: 137 mmol/L (ref 135–145)
Total Bilirubin: 0.4 mg/dL (ref 0.3–1.2)
Total Protein: 7.9 g/dL (ref 6.5–8.1)

## 2023-05-15 NOTE — Assessment & Plan Note (Signed)
MRI brain was negative.  I recommend neurology evaluation. She reports that she has done some researches and attribute these symptoms to Hypnagogic hallucinations. I encourage her to further discuss with PCP if symptoms are worse.

## 2023-05-15 NOTE — Progress Notes (Signed)
Pt report that she will have an MRI because due to hallucinations. No new breast problems

## 2023-05-15 NOTE — Progress Notes (Signed)
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  ASSESSMENT & PLAN:   Cancer Staging  Malignant neoplasm of lower-outer quadrant of right female breast [facility]) Staging form: Breast, AJCC 8th Edition - Clinical stage from 05/08/2018: Stage IIB (cT2, cN0, cM0, G3, ER-, PR-, HER2-) - Signed by Rickard Patience, MD on 05/08/2018 - Pathologic stage from 11/14/2018: No Stage Recommended (ypT0, pN0, cM0, ER-, PR-, HER2-) - Signed by Rickard Patience, MD on 11/14/2018   Malignant neoplasm of lower-outer quadrant of right female breast [facility]) #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  s/p right lumpectomy with excisional biopsy of sentinel lymph node. S/p radiation.  Patient achieved complete pathological remission,  ypT0 ypN0 Clinically patient is doing very well.  She is 5 years after surgery. Labs were reviewed and discussed with patient.   Tumor markers are pending.   Recent mammogram results were reviewed and discussed with patient.  Continue observation and imaging surveillance- diagnostic mammogram in August 2025   Hallucination MRI brain was negative.  I recommend neurology evaluation. She reports that she has done some researches and attribute these symptoms to Hypnagogic hallucinations. I encourage her to further discuss with PCP if symptoms are worse.    Orders Placed This Encounter  Procedures   CBC with Differential (Cancer Center Only)    Standing Status:   Future    Standing Expiration Date:   05/14/2024   CMP (Cancer Center only)    Standing Status:   Future    Standing Expiration Date:   05/14/2024   Cancer antigen 27.29    Standing Status:   Future    Standing Expiration Date:   05/14/2024   Cancer antigen 15-3    Standing Status:   Future    Standing Expiration Date:   05/14/2024   Follow up in 6 months.  All  questions were answered. The patient knows to call the clinic with any problems, questions or concerns.  Rickard Patience, MD, PhD [facility] Hematology Oncology 05/15/2023    HISTORY OF PRESENTING ILLNESS:  Wendy Marsh is a  67 y.o.  female follow-up of breast cancer.  04/28/2019 diagnostic mammogram Mammogram showed right breast 6 o'clock axis breast mass, 2.9 cm corresponding to area of concern.  cT2N0  Breast mass was biopsied, and pathology showed invasive mammary carcinoma.  Grade 3 , ER/PR- HER2 -.  LVI suspicious, favor present.   #  baseline MUGA testing done and reviewed systolic function depression, LVEF 45%. Given that Adriamycin has cardiac toxicity, potentially can lead to irreversible cardiomyopathy, I discussed with patient about using non-anthracycline-containing regimen. # 05/22/2018- 09/11/2018  S/p 6 cycle of Docetaxel 75mg /m2 and Carboplatin every 3 weeks [ data from PROGECT study Judi Cong et al 2018 Clin Cancer Res, Pathological Response and Survival in Triple-Negative Breast Cancer Following Neoadjuvant Carboplatin plus Docetaxel.Stage I-III TNBC patients, Neoadjuvant carboplatin (AUC6) plus docetaxel (75 mg/m2) every 21 days  6 cycles. pCR was 55% and Residual cancer burden (RCB) were 13%. Excellent Three-year RFS was 90% in patients with pCR and 66% in those without pCR]    # 10/29/2018   right breast lumpectomy with  excisional biopsy of the right axillary deep sentinel lymph nodes. Pathology showed CR  ypT0, ypN0- complete pathological remission.   # # Family history of breast cancer plus personal history of triple negative breast cancer. Testing did not reveal  a pathogenic mutation in any of the genes analyzed.   BRCA negative.  VUS was detected. Does not change management.   #Mediport was removed. 05/05/2022, bilateral diagnostic mammogram showed stable appearance of the left breast mass for greater than 2 years. Consistent with a benign etiology. No  mammographic evidence malignancy bilaterally.   INTERVAL HISTORY Wendy Marsh is a 67 y.o. female who has above history reviewed by me today presents for discussion of pathology and further management plan for triple negative breast cancer. Patient reports feeling well today.  No new complaints. She denies any breast concerns.  She has noticed "visual and auditory hallucinations"  She reports seeing "a rectangular shape box" in front of her which she later discovered it was her books shelf. She attributes to her cataract for the false depth perception. She hears people talking in another room,  PCP was aware about this and has ordered MRI brain was negative for malignancy or other acute process.   .. Review of Systems  Constitutional:  Negative for chills, fever, malaise/fatigue and weight loss.  HENT:  Negative for nosebleeds and sore throat.   Eyes:  Negative for double vision, photophobia and redness.  Respiratory:  Negative for cough, shortness of breath and wheezing.   Cardiovascular:  Negative for chest pain, palpitations, orthopnea and leg swelling.  Gastrointestinal:  Negative for abdominal pain, blood in stool, nausea and vomiting.  Genitourinary:  Negative for dysuria.  Musculoskeletal:  Positive for back pain and joint pain. Negative for myalgias and neck pain.       Chronic right knee pain.  Skin:  Negative for itching and rash.  Neurological:  Negative for dizziness, tingling and tremors.  Endo/Heme/Allergies:  Negative for environmental allergies. Does not bruise/bleed easily.  Psychiatric/Behavioral:  Negative for depression and hallucinations.     MEDICAL HISTORY:  Past Medical History:  Diagnosis Date   Arthritis    Atrial fibrillation (HCC)    Breast cancer (HCC) 04/2018   right breast   Dysrhythmia    afib   Hyperlipidemia    Hypertension    Hypotension due to drugs 05/30/2018   Hypothyroidism    Lumbago    Osteoporosis    Personal history of  chemotherapy    Personal history of radiation therapy    Thyroid disease     SURGICAL HISTORY: Past Surgical History:  Procedure Laterality Date   BREAST BIOPSY Right 05/02/2018   Invasive mammary carcinoma, grade 3, neo adj chemo   BREAST LUMPECTOMY Right 10/29/2018   NO RESIDUAL MALIGNANCY IDENTIFIED.    BREAST LUMPECTOMY WITH NEEDLE LOCALIZATION AND AXILLARY SENTINEL LYMPH NODE BX Right 10/29/2018   Procedure: BREAST LUMPECTOMY WITH NEEDLE LOCALIZATION AND SENTINEL LYMPH NODE BX;  Surgeon: Ancil Linsey, MD;  Location: [facility] ORS;  Service: General;  Laterality: Right;   CATARACT EXTRACTION Bilateral    JOINT REPLACEMENT Left    tkr   PORTACATH PLACEMENT Right 05/11/2018   Procedure: INSERTION  I.J PORT-A-CATH;  Surgeon: Earline Mayotte, MD;  Location: [facility] ORS;  Service: General;  Laterality: Right;   REPLACEMENT TOTAL KNEE      SOCIAL HISTORY: Social History   Socioeconomic History   Marital status: Married    Spouse name: jerry   Number of children: 3   Years of education: Not on file   Highest education level: Some college, no degree  Occupational History   Not on file  Tobacco Use   Smoking status: Never   Smokeless tobacco: Never  Vaping Use   Vaping status: Never Used  Substance and Sexual Activity   Alcohol use: No   Drug use: No   Sexual activity: Yes    Birth control/protection: Post-menopausal  Other Topics Concern   Not on file  Social History Narrative   Not on file   Social Determinants of Health   Financial Resource Strain: Low Risk  (06/30/2022)   Overall Financial Resource Strain (CARDIA)    Difficulty of Paying Living Expenses: Not hard at all  Food Insecurity: No Food Insecurity (06/30/2022)   Hunger Vital Sign    Worried About Running Out of Food in the Last Year: Never true    Ran Out of Food in the Last Year: Never true  Transportation Needs: No Transportation Needs (06/30/2022)   PRAPARE - Administrator, Civil Service  (Medical): No    Lack of Transportation (Non-Medical): No  Physical Activity: Inactive (06/30/2022)   Exercise Vital Sign    Days of Exercise per Week: 0 days    Minutes of Exercise per Session: 0 min  Stress: No Stress Concern Present (06/30/2022)   [employer] of Occupational Health - Occupational Stress Questionnaire    Feeling of Stress : Not at all  Social Connections: Moderately Isolated (06/30/2022)   Social Connection and Isolation Panel [NHANES]    Frequency of Communication with Friends and Family: Twice a week    Frequency of Social Gatherings with Friends and Family: Twice a week    Attends Religious Services: Never    Database administrator or Organizations: No    Attends Banker Meetings: Never    Marital Status: Married  Catering manager Violence: Not At Risk (06/30/2022)   Humiliation, Afraid, Rape, and Kick questionnaire    Fear of Current or Ex-Partner: No    Emotionally Abused: No    Physically Abused: No    Sexually Abused: No    FAMILY HISTORY: Family History  Problem Relation Age of Onset   Diabetes Mother    Congestive Heart Failure Mother    Breast cancer Maternal Grandmother        dx 33s; deceased 72s   Cancer Maternal Uncle 60       deceased 18s   Other Father        No information on father or paternal relatives   Breast cancer Other 50       paternal half-sister; also esophageal ca at 49; currently 79   Diabetes Other    Hypertension Other    Kidney disease Other    Thyroid disease Other     ALLERGIES:  is allergic to gabapentin, morphine and codeine, peanut-containing drug products, and tape.  MEDICATIONS:  Current Outpatient Medications  Medication Sig Dispense Refill   acetaminophen (TYLENOL) 500 MG tablet Take 500 mg by mouth every 6 (six) hours as needed.     allopurinol (ZYLOPRIM) 300 MG tablet Take 1 tablet (300 mg total) by mouth daily. 90 tablet 1   apixaban (ELIQUIS) 5 MG TABS tablet Take 5 mg by mouth in the  morning and at bedtime.     flecainide (TAMBOCOR) 100 MG tablet Take 100 mg by mouth 2 (two) times daily.     levothyroxine (SYNTHROID) 200 MCG tablet Take 1 tablet (200 mcg total) by mouth daily before breakfast. 90 tablet 3   lisinopril-hydrochlorothiazide (ZESTORETIC) 20-25 MG tablet Take 2 tablets by mouth daily. 180 tablet 1   metoprolol succinate (TOPROL-XL) 50 MG 24 hr tablet  Take with or immediately following a meal. 90 tablet 1   ALPRAZolam (XANAX) 0.5 MG tablet Take 1 pill 30 minutes before MRI, repeat immediately before MRI, do not drive on this medicine (Patient not taking: Reported on 05/15/2023) 2 tablet 0   EPINEPHrine 0.3 mg/0.3 mL IJ SOAJ injection Inject 0.3 mg into the muscle as needed for anaphylaxis. (Patient not taking: Reported on 05/15/2023) 1 each 12   No current facility-administered medications for this visit.     PHYSICAL EXAMINATION: ECOG PERFORMANCE STATUS: 1 - Symptomatic but completely ambulatory Vitals:   05/15/23 1328  BP: (!) 167/61  Pulse: 61  Resp: 18  Temp: (!) 96.7 F (35.9 C)   Filed Weights   05/15/23 1328  Weight: (!) 312 lb 9.6 oz (141.8 kg)    Physical Exam Constitutional:      General: She is not in acute distress.    Appearance: She is obese.     Comments: Obese, walks with a walker  HENT:     Head: Normocephalic and atraumatic.  Eyes:     General: No scleral icterus.    Pupils: Pupils are equal, round, and reactive to light.  Cardiovascular:     Rate and Rhythm: Normal rate and regular rhythm.     Heart sounds: Normal heart sounds.  Pulmonary:     Effort: Pulmonary effort is normal. No respiratory distress.     Breath sounds: No wheezing.  Abdominal:     General: Bowel sounds are normal. There is no distension.     Palpations: Abdomen is soft. There is no mass.     Tenderness: There is no abdominal tenderness.  Musculoskeletal:        General: No deformity. Normal range of motion.     Cervical back: Normal range of motion.      Comments: Chronic lower extremity edema.  1+  Skin:    General: Skin is warm and dry.     Findings: No erythema or rash.  Neurological:     Mental Status: She is alert and oriented to person, place, and time. Mental status is at baseline.     Cranial Nerves: No cranial nerve deficit.     Coordination: Coordination normal.  Psychiatric:        Mood and Affect: Mood normal.      LABORATORY DATA:  I have reviewed the data as listed    Latest Ref Rng & Units 05/15/2023    1:04 PM 03/06/2023    8:59 AM 11/10/2022    1:54 PM  CBC  WBC 4.0 - 10.5 K/uL 9.7  10.7  11.3   Hemoglobin 12.0 - 15.0 g/dL 40.9  81.1  91.4   Hematocrit 36.0 - 46.0 % 40.4  37.5  39.5   Platelets 150 - 400 K/uL 307  329  322       Latest Ref Rng & Units 05/15/2023    1:04 PM 03/06/2023    8:59 AM 11/10/2022    1:54 PM  CMP  Glucose 70 - 99 mg/dL 82  99  88   BUN 8 - 23 mg/dL 17  21  20    Creatinine 0.44 - 1.00 mg/dL 7.82  9.56  2.13   Sodium 135 - 145 mmol/L 137  143  138   Potassium 3.5 - 5.1 mmol/L 3.5  3.5  3.8   Chloride 98 - 111 mmol/L 95  98  98   CO2 22 - 32 mmol/L 29  25  27  Calcium 8.9 - 10.3 mg/dL 9.2  9.4  9.1   Total Protein 6.5 - 8.1 g/dL 7.9  6.8  8.1   Total Bilirubin 0.3 - 1.2 mg/dL 0.4  0.2  0.3   Alkaline Phos 38 - 126 U/L 79  96  90   AST 15 - 41 U/L 16  15  24    ALT 0 - 44 U/L 15  14  19      RADIOGRAPHIC STUDIES: I have personally reviewed the radiological images as listed and agreed with the findings in the report. NM Cardiac Muga rest: Calculated LEFT ventricular ejection fraction equals 45%

## 2023-05-15 NOTE — Assessment & Plan Note (Addendum)
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  s/p right lumpectomy with excisional biopsy of sentinel lymph node. S/p radiation.  Patient achieved complete pathological remission,  ypT0 ypN0 Clinically patient is doing very well.  She is 5 years after surgery. Labs were reviewed and discussed with patient.   Tumor markers are pending.   Recent mammogram results were reviewed and discussed with patient.  Continue observation and imaging surveillance- diagnostic mammogram in August 2025

## 2023-06-22 ENCOUNTER — Other Ambulatory Visit: Payer: Self-pay | Admitting: Family Medicine

## 2023-06-23 NOTE — Telephone Encounter (Signed)
Requested Prescriptions  Pending Prescriptions Disp Refills   levothyroxine (SYNTHROID) 200 MCG tablet [Pharmacy Med Name: Levothyroxine Sodium 200 MCG Oral Tablet] 90 tablet 0    Sig: TAKE 1 TABLET BY MOUTH ONCE DAILY BEFORE BREAKFAST     Endocrinology:  Hypothyroid Agents Passed - 06/22/2023  8:27 AM      Passed - TSH in normal range and within 360 days    TSH  Date Value Ref Range Status  09/02/2022 0.642 0.450 - 4.500 uIU/mL Final         Passed - Valid encounter within last 12 months    Recent Outpatient Visits           3 months ago Benign essential hypertension   [place] [facility] Lov[place], Megan P, DO   9 months ago Benign essential hypertension   [place] [facility] [place], [place], DO   1 year ago Benign essential hypertension   [place] [facility] [place], [place] P, DO   1 year ago Pneumonia due to influenza A virus   [place] [facility] [place], [place], DO   2 years ago Benign essential hypertension   [place] [facility] Dorcas Carrow, DO       Future Appointments             In 2 months Laural Benes, Oralia Rud, DO [place] [facility]

## 2023-07-24 ENCOUNTER — Other Ambulatory Visit: Payer: Self-pay | Admitting: Student

## 2023-07-24 DIAGNOSIS — R0602 Shortness of breath: Secondary | ICD-10-CM

## 2023-07-24 DIAGNOSIS — Z5181 Encounter for therapeutic drug level monitoring: Secondary | ICD-10-CM

## 2023-07-24 DIAGNOSIS — I4819 Other persistent atrial fibrillation: Secondary | ICD-10-CM

## 2023-08-10 ENCOUNTER — Ambulatory Visit
Admission: RE | Admit: 2023-08-10 | Discharge: 2023-08-10 | Disposition: A | Discharge status: Home / Self Care | Payer: Medicare Other | Source: Ambulatory Visit | Attending: Student | Admitting: Student

## 2023-08-10 DIAGNOSIS — R0602 Shortness of breath: Secondary | ICD-10-CM | POA: Insufficient documentation

## 2023-08-10 DIAGNOSIS — Z79899 Other long term (current) drug therapy: Secondary | ICD-10-CM | POA: Insufficient documentation

## 2023-08-10 DIAGNOSIS — I4819 Other persistent atrial fibrillation: Secondary | ICD-10-CM | POA: Insufficient documentation

## 2023-08-10 DIAGNOSIS — Z5181 Encounter for therapeutic drug level monitoring: Secondary | ICD-10-CM | POA: Diagnosis present

## 2023-08-10 MED ORDER — TECHNETIUM TC 99M TETROFOSMIN IV KIT
30.0000 | PACK | Freq: Once | INTRAVENOUS | Status: AC | PRN
  Administered 2023-08-10: 29.3 via INTRAVENOUS

## 2023-08-11 ENCOUNTER — Encounter
Admission: RE | Admit: 2023-08-11 | Discharge: 2023-08-11 | Disposition: A | Discharge status: Home / Self Care | Payer: Medicare Other | Source: Ambulatory Visit | Attending: Student | Admitting: Student

## 2023-08-11 MED ORDER — REGADENOSON 0.4 MG/5ML IV SOLN
0.4000 mg | Freq: Once | INTRAVENOUS | Status: AC
  Administered 2023-08-10: 0.4 mg via INTRAVENOUS

## 2023-08-11 MED ORDER — TECHNETIUM TC 99M TETROFOSMIN IV KIT
30.0000 | PACK | Freq: Once | INTRAVENOUS | Status: AC
  Administered 2023-08-11: 29.6 via INTRAVENOUS

## 2023-09-05 ENCOUNTER — Other Ambulatory Visit: Payer: Self-pay | Admitting: Family Medicine

## 2023-09-05 ENCOUNTER — Ambulatory Visit: Payer: Medicare Other | Admitting: Family Medicine

## 2023-09-05 ENCOUNTER — Ambulatory Visit
Admission: RE | Admit: 2023-09-05 | Discharge: 2023-09-05 | Disposition: A | Discharge status: Home / Self Care | Payer: Medicare Other | Source: Ambulatory Visit | Attending: Family Medicine | Admitting: Family Medicine

## 2023-09-05 ENCOUNTER — Encounter: Payer: Self-pay | Admitting: Family Medicine

## 2023-09-05 VITALS — BP 144/62 | HR 66 | Wt 311.6 lb

## 2023-09-05 DIAGNOSIS — N950 Postmenopausal bleeding: Secondary | ICD-10-CM

## 2023-09-05 DIAGNOSIS — M109 Gout, unspecified: Secondary | ICD-10-CM

## 2023-09-05 DIAGNOSIS — Z6841 Body Mass Index (BMI) 40.0 and over, adult: Secondary | ICD-10-CM

## 2023-09-05 DIAGNOSIS — R310 Gross hematuria: Secondary | ICD-10-CM

## 2023-09-05 DIAGNOSIS — R9389 Abnormal findings on diagnostic imaging of other specified body structures: Secondary | ICD-10-CM

## 2023-09-05 DIAGNOSIS — E039 Hypothyroidism, unspecified: Secondary | ICD-10-CM | POA: Diagnosis not present

## 2023-09-05 DIAGNOSIS — E782 Mixed hyperlipidemia: Secondary | ICD-10-CM

## 2023-09-05 DIAGNOSIS — Z0000 Encounter for general adult medical examination without abnormal findings: Secondary | ICD-10-CM | POA: Diagnosis not present

## 2023-09-05 DIAGNOSIS — Z23 Encounter for immunization: Secondary | ICD-10-CM

## 2023-09-05 DIAGNOSIS — I10 Essential (primary) hypertension: Secondary | ICD-10-CM | POA: Diagnosis not present

## 2023-09-05 LAB — MICROSCOPIC EXAMINATION

## 2023-09-05 LAB — MICROALBUMIN, URINE WAIVED
Creatinine, Urine Waived: 100 mg/dL (ref 10–300)
Microalb, Ur Waived: 30 mg/L — ABNORMAL HIGH (ref 0–19)
Microalb/Creat Ratio: <30 mg/g (ref <30)

## 2023-09-05 LAB — URINALYSIS, ROUTINE W REFLEX MICROSCOPIC
Bilirubin, UA: NEGATIVE
Glucose, UA: NEGATIVE
Ketones, UA: NEGATIVE
Leukocytes,UA: NEGATIVE
Nitrite, UA: NEGATIVE
Protein,UA: NEGATIVE
Specific Gravity, UA: 1.025 (ref 1.005–1.030)
Urobilinogen, Ur: 0.2 mg/dL (ref 0.2–1.0)
pH, UA: 5.5 (ref 5.0–7.5)

## 2023-09-05 MED ORDER — LISINOPRIL-HYDROCHLOROTHIAZIDE 20-25 MG PO TABS: 2.0000 | ORAL_TABLET | Freq: Every day | ORAL | 1 refills | Status: DC

## 2023-09-05 MED ORDER — METOPROLOL SUCCINATE ER 50 MG PO TB24: ORAL_TABLET | ORAL | 1 refills | Status: DC

## 2023-09-05 MED ORDER — ALLOPURINOL 300 MG PO TABS: 300.0000 mg | ORAL_TABLET | Freq: Every day | ORAL | 1 refills | Status: DC

## 2023-09-05 MED ORDER — EPINEPHRINE 0.3 MG/0.3ML IJ SOAJ: 0.3000 mg | INTRAMUSCULAR | 12 refills | Status: AC | PRN

## 2023-09-05 NOTE — Assessment & Plan Note (Signed)
Under good control on current regimen. Continue current regimen. Continue to monitor. Call with any concerns. Refills given. Labs drawn today.   

## 2023-09-05 NOTE — Progress Notes (Signed)
BP (!) 144/62   Pulse 66   Wt (!) 311 lb 9.6 oz (141.3 kg)   LMP 03/17/2009 (Approximate)   SpO2 95%   BMI 57.73 kg/m    Subjective:    Patient ID: Wendy Marsh, female    DOB: 09-06-1956, 67 y.o.   MRN: [id number]  HPI: Wendy Marsh is a 67 y.o. female presenting on 09/05/2023 for comprehensive medical examination. Current medical complaints include:  URINARY SYMPTOMS- no pain, seemed like there was "tissue" in it- more like a period than just in her urine Duration: 3 weeks Dysuria: no Urinary frequency: yes Urgency: yes Small volume voids: no Symptom severity: no Urinary incontinence: no Foul odor: yes Hematuria: yes 1x a day Abdominal pain: no Back pain: no Suprapubic pain/pressure: yes Flank pain: yes Fever:  no Vomiting: no Relief with cranberry juice: no Relief with pyridium: no Status: stable Previous urinary tract infection: yes Recurrent urinary tract infection: no History of sexually transmitted disease: no Vaginal discharge: no Treatments attempted: increasing fluids   No gout flares. Tolerating medicine well.   HYPERTENSION / HYPERLIPIDEMIA Satisfied with current treatment? yes Duration of hypertension: chronic BP monitoring frequency: rarely BP medication side effects: no Past BP meds: metoprolol, lisinopril, HCTZ Duration of hyperlipidemia: chronic Cholesterol medication side effects: no Cholesterol supplements: none Past cholesterol medications: none Medication compliance: excellent compliance Aspirin: no Recent stressors: no Recurrent headaches: no Visual changes: no Palpitations: no Dyspnea: no Chest pain: no Lower extremity edema: no Dizzy/lightheaded: no  HYPOTHYROIDISM Thyroid control status:controlled Satisfied with current treatment? yes Medication side effects: no Medication compliance: excellent compliance Recent dose adjustment:no Fatigue: no Cold intolerance: no Heat intolerance: no Weight gain: no Weight  loss: no Constipation: no Diarrhea/loose stools: no Palpitations: no Lower extremity edema: no Anxiety/depressed mood: no  Menopausal Symptoms: no  Functional Status Survey: Is the patient deaf or have difficulty hearing?: No Does the patient have difficulty seeing, even when wearing glasses/contacts?: No Does the patient have difficulty concentrating, remembering, or making decisions?: No Does the patient have difficulty walking or climbing stairs?: Yes Does the patient have difficulty dressing or bathing?: No Does the patient have difficulty doing errands alone such as visiting a doctor's office or shopping?: No     09/05/2023    9:04 AM 03/06/2023    9:09 AM 09/02/2022   10:10 AM 06/30/2022    3:03 PM 06/26/2022    9:02 PM  Fall Risk   Falls in the past year? 0 0 0 0 0  Number falls in past yr: 0 0 0 0   Injury with Fall? 0 0 0 0   Risk for fall due to : No Fall Risks No Fall Risks No Fall Risks No Fall Risks   Follow up Falls evaluation completed Falls evaluation completed Falls evaluation completed Falls evaluation completed     Depression Screen    09/05/2023    9:04 AM 03/06/2023    9:10 AM 09/02/2022   10:10 AM 06/30/2022    3:02 PM 03/03/2022    9:24 AM  Depression screen PHQ 2/9  Decreased Interest 0 0 0 0 0  Down, Depressed, Hopeless 0 0 0 0 0  PHQ - 2 Score 0 0 0 0 0  Altered sleeping 1 0 1 0 0  Tired, decreased energy 0 0 0 0 0  Change in appetite 0 0 0 0 0  Feeling bad or failure about yourself  0 0 0 0 0  Trouble concentrating 0 0  0 0 0  Moving slowly or fidgety/restless 0 0 0 0 0  Suicidal thoughts 0 0 0 0 0  PHQ-9 Score 1 0 1 0 0  Difficult doing work/chores Not difficult at all Not difficult at all Not difficult at all Not difficult at all Not difficult at all     Advanced Directives Does patient have a HCPOA?    yes If yes, name and contact information:  Does patient have a living will or MOST form?  yes  Past Medical History:  Past Medical History:   Diagnosis Date   Arthritis    Atrial fibrillation (HCC)    Breast cancer (HCC) 04/2018   right breast   Dysrhythmia    afib   Hyperlipidemia    Hypertension    Hypotension due to drugs 05/30/2018   Hypothyroidism    Lumbago    Osteoporosis    Personal history of chemotherapy    Personal history of radiation therapy    Thyroid disease     Surgical History:  Past Surgical History:  Procedure Laterality Date   BREAST BIOPSY Right 05/02/2018   Invasive mammary carcinoma, grade 3, neo adj chemo   BREAST LUMPECTOMY Right 10/29/2018   NO RESIDUAL MALIGNANCY IDENTIFIED.    BREAST LUMPECTOMY WITH NEEDLE LOCALIZATION AND AXILLARY SENTINEL LYMPH NODE BX Right 10/29/2018   Procedure: BREAST LUMPECTOMY WITH NEEDLE LOCALIZATION AND SENTINEL LYMPH NODE BX;  Surgeon: Ancil Linsey, MD;  Location: [facility] OR[facility];  Service: General;  Laterality: Right;   CATARACT EXTRACTION Bilateral    JOINT REPLACEMENT Left    tkr   PORTACATH PLACEMENT Right 05/11/2018   Procedure: INSERTION  I.J PORT-A-CATH;  Surgeon: Earline Mayotte, MD;  Location: [facility];  Service: General;  Laterality: Right;   REPLACEMENT TOTAL KNEE      Medications:  Current Outpatient Medications on File Prior to Visit  Medication Sig   acetaminophen (TYLENOL) 500 MG tablet Take 500 mg by mouth every 6 (six) hours as needed.   apixaban (ELIQUIS) 5 MG TABS tablet Take 5 mg by mouth in the morning and at bedtime.   flecainide (TAMBOCOR) 100 MG tablet Take 100 mg by mouth 2 (two) times daily.   levothyroxine (SYNTHROID) 200 MCG tablet TAKE 1 TABLET BY MOUTH ONCE DAILY BEFORE BREAKFAST   No current facility-administered medications on file prior to visit.    Allergies:  Allergies  Allergen Reactions   Gabapentin     "makes me feel kinda out of it"   Morphine And Codeine Nausea And Vomiting   Peanut-Containing Drug Products Swelling   Tape Rash    Social History:  Social History   Socioeconomic History   Marital  status: Married    Spouse name: jerry   Number of children: 3   Years of education: Not on file   Highest education level: GED or equivalent  Occupational History   Not on file  Tobacco Use   Smoking status: Never   Smokeless tobacco: Never  Vaping Use   Vaping status: Never Used  Substance and Sexual Activity   Alcohol use: No   Drug use: No   Sexual activity: Yes    Birth control/protection: Post-menopausal  Other Topics Concern   Not on file  Social History Narrative   Not on file   Social Determinants of Health   Financial Resource Strain: Low Risk  (09/01/2023)   Overall Financial Resource Strain (CARDIA)    Difficulty of Paying Living Expenses: Not hard at all  Food Insecurity: No Food Insecurity (09/01/2023)   Hunger Vital Sign    Worried About Running Out of Food in the Last Year: Never true    Ran Out of Food in the Last Year: Never true  Transportation Needs: No Transportation Needs (09/01/2023)   PRAPARE - Administrator, Civil Service (Medical): No    Lack of Transportation (Non-Medical): No  Physical Activity: Unknown (09/01/2023)   Exercise Vital Sign    Days of Exercise per Week: 0 days    Minutes of Exercise per Session: Not on file  Stress: No Stress Concern Present (09/01/2023)   [employer] of Occupational Health - Occupational Stress Questionnaire    Feeling of Stress : Not at all  Social Connections: Socially Isolated (09/01/2023)   Social Connection and Isolation Panel [NHANES]    Frequency of Communication with Friends and Family: Once a week    Frequency of Social Gatherings with Friends and Family: Once a week    Attends Religious Services: Never    Database administrator or Organizations: No    Attends Engineer, structural: Not on file    Marital Status: Married  Catering manager Violence: Not At Risk (06/30/2022)   Humiliation, Afraid, Rape, and Kick questionnaire    Fear of Current or Ex-Partner: No     Emotionally Abused: No    Physically Abused: No    Sexually Abused: No   Social History   Tobacco Use  Smoking Status Never  Smokeless Tobacco Never   Social History   Substance and Sexual Activity  Alcohol Use No    Family History:  Family History  Problem Relation Age of Onset   Diabetes Mother    Congestive Heart Failure Mother    Breast cancer Maternal Grandmother        dx 76s; deceased 87s   Cancer Maternal Uncle 55       deceased 69s   Other Father        No information on father or paternal relatives   Breast cancer Other 89       paternal half-sister; also esophageal ca at 91; currently 75   Diabetes Other    Hypertension Other    Kidney disease Other    Thyroid disease Other     Past medical history, surgical history, medications, allergies, family history and social history reviewed with patient today and changes made to appropriate areas of the chart.   Review of Systems  Constitutional: Negative.   HENT: Negative.    Eyes: Negative.   Respiratory: Negative.    Cardiovascular: Negative.   Gastrointestinal: Negative.   Genitourinary:  Positive for hematuria. Negative for dysuria, flank pain, frequency and urgency.  Musculoskeletal: Negative.   Skin: Negative.   Neurological: Negative.   Endo/Heme/Allergies: Negative.   Psychiatric/Behavioral:  Negative for depression, hallucinations, memory loss, substance abuse and suicidal ideas. The patient is nervous/anxious. The patient does not have insomnia.     All other ROS negative except what is listed above and in the HPI.      Objective:    BP (!) 144/62   Pulse 66   Wt (!) 311 lb 9.6 oz (141.3 kg)   LMP 03/17/2009 (Approximate)   SpO2 95%   BMI 57.73 kg/m   Wt Readings from Last 3 Encounters:  09/05/23 (!) 311 lb 9.6 oz (141.3 kg)  05/15/23 (!) 312 lb 9.6 oz (141.8 kg)  03/06/23 (!) 315 lb 6.4 oz (143.1 kg)  Physical Exam Vitals and nursing note reviewed.  Constitutional:      General:  She is not in acute distress.    Appearance: Normal appearance. She is obese. She is not ill-appearing, toxic-appearing or diaphoretic.  HENT:     Head: Normocephalic and atraumatic.     Right Ear: Tympanic membrane, ear canal and external ear normal. There is no impacted cerumen.     Left Ear: Tympanic membrane, ear canal and external ear normal. There is no impacted cerumen.     Nose: Nose normal. No congestion or rhinorrhea.     Mouth/Throat:     Mouth: Mucous membranes are moist.     Pharynx: Oropharynx is clear. No oropharyngeal exudate or posterior oropharyngeal erythema.  Eyes:     General: No scleral icterus.       Right eye: No discharge.        Left eye: No discharge.     Extraocular Movements: Extraocular movements intact.     Conjunctiva/sclera: Conjunctivae normal.     Pupils: Pupils are equal, round, and reactive to light.  Neck:     Vascular: No carotid bruit.  Cardiovascular:     Rate and Rhythm: Normal rate and regular rhythm.     Pulses: Normal pulses.     Heart sounds: No murmur heard.    No friction rub. No gallop.  Pulmonary:     Effort: Pulmonary effort is normal. No respiratory distress.     Breath sounds: Normal breath sounds. No stridor. No wheezing, rhonchi or rales.  Chest:     Chest wall: No tenderness.  Abdominal:     General: Abdomen is flat. Bowel sounds are normal. There is no distension.     Palpations: Abdomen is soft. There is no mass.     Tenderness: There is no abdominal tenderness. There is no right CVA tenderness, left CVA tenderness, guarding or rebound.     Hernia: No hernia is present.  Genitourinary:    Comments: Breast and pelvic exams deferred with shared decision making Musculoskeletal:        General: No swelling, tenderness, deformity or signs of injury.     Cervical back: Normal range of motion and neck supple. No rigidity. No muscular tenderness.     Right lower leg: No edema.     Left lower leg: No edema.  Lymphadenopathy:      Cervical: No cervical adenopathy.  Skin:    General: Skin is warm and dry.     Capillary Refill: Capillary refill takes less than 2 seconds.     Coloration: Skin is not jaundiced or pale.     Findings: No bruising, erythema, lesion or rash.  Neurological:     General: No focal deficit present.     Mental Status: She is alert and oriented to person, place, and time. Mental status is at baseline.     Cranial Nerves: No cranial nerve deficit.     Sensory: No sensory deficit.     Motor: No weakness.     Coordination: Coordination normal.     Gait: Gait normal.     Deep Tendon Reflexes: Reflexes normal.  Psychiatric:        Mood and Affect: Mood normal.        Behavior: Behavior normal.        Thought Content: Thought content normal.        Judgment: Judgment normal.        09/05/2023    9:18 AM 06/30/2022  3:04 PM 06/29/2021    5:06 PM 06/16/2020   11:07 AM  6CIT Screen  What Year? 0 points 0 points 0 points 0 points  What month? 0 points 0 points 0 points 0 points  What time? 0 points 0 points 0 points 0 points  Count back from 20 0 points 0 points 0 points 0 points  Months in reverse 0 points 0 points 0 points 0 points  Repeat phrase 2 points 0 points 0 points 0 points  Total Score 2 points 0 points 0 points 0 points    Results for orders placed or performed in visit on 09/05/23  Microscopic Examination   Urine  Result Value Ref Range   WBC, UA 0-5 0 - 5 /hpf   RBC, Urine 3-10 (A) 0 - 2 /hpf   Epithelial Cells (non renal) 0-10 0 - 10 /hpf   Casts Present (A) None seen /lpf   Cast Type Hyaline casts N/A   Mucus, UA Present (A) Not Estab.   Bacteria, UA Few None seen/Few  Microalbumin, Urine Waived  Result Value Ref Range   Microalb, Ur Waived 30 (H) 0 - 19 mg/L   Creatinine, Urine Waived 100 10 - 300 mg/dL   Microalb/Creat Ratio <30 <30 mg/g  Urinalysis, Routine w reflex microscopic  Result Value Ref Range   Specific Gravity, UA 1.025 1.005 - 1.030   pH, UA 5.5  5.0 - 7.5   Color, UA Yellow Yellow   Appearance Ur Cloudy (A) Clear   Leukocytes,UA Negative Negative   Protein,UA Negative Negative/Trace   Glucose, UA Negative Negative   Ketones, UA Negative Negative   RBC, UA 2+ (A) Negative   Bilirubin, UA Negative Negative   Urobilinogen, Ur 0.2 0.2 - 1.0 mg/dL   Nitrite, UA Negative Negative   Microscopic Examination See below:       Assessment & Plan:   Problem List Items Addressed This Visit       Cardiovascular and Mediastinum   Benign essential hypertension    Running slightly high. Will work on [employer] and recheck in about a month. Refills given. Labs drawn today.      Relevant Medications   lisinopril-hydrochlorothiazide (ZESTORETIC) 20-25 MG tablet   metoprolol succinate (TOPROL-XL) 50 MG 24 hr tablet   EPINEPHrine 0.3 mg/0.3 mL IJ SOAJ injection   Other Relevant Orders   Microalbumin, Urine Waived (Completed)   CBC with Differential/Platelet   Comprehensive metabolic panel     Endocrine   Thyroid activity decreased    Rechecking labs today. Await results. Treat as needed.       Relevant Medications   metoprolol succinate (TOPROL-XL) 50 MG 24 hr tablet   Other Relevant Orders   CBC with Differential/Platelet   Comprehensive metabolic panel   TSH     Musculoskeletal and Integument   Acute gout involving toe of right foot    Under good control on current regimen. Continue current regimen. Continue to monitor. Call with any concerns. Refills given. Labs drawn today.        Relevant Medications   allopurinol (ZYLOPRIM) 300 MG tablet   Other Relevant Orders   Uric acid   CBC with Differential/Platelet   Comprehensive metabolic panel     Other   Combined fat and carbohydrate induced hyperlipemia    Under good control on current regimen. Continue current regimen. Continue to monitor. Call with any concerns. Refills given. Labs drawn today.       Relevant Medications  lisinopril-hydrochlorothiazide  (ZESTORETIC) 20-25 MG tablet   metoprolol succinate (TOPROL-XL) 50 MG 24 hr tablet   EPINEPHrine 0.3 mg/0.3 mL IJ SOAJ injection   Other Relevant Orders   CBC with Differential/Platelet   Comprehensive metabolic panel   Lipid Panel w/o Chol/HDL Ratio   Morbid obesity (HCC)    Encouraged diet and exercise with goal of losing 1-2 lbs per week. Call with any concerns.       Other Visit Diagnoses     Encounter for Medicare annual wellness exam    -  Primary   Preventative care discussed today as below.   Postmenopausal bleeding       Will get [place] ASAP. Await results. Treat as needed.   Relevant Orders   US Pelvic Complete With Transvaginal   Gross hematuria       2+ blood on UA- concern for post-menopausal bleeding. Will get [place]. Await results.   Relevant Orders   CBC with Differential/Platelet   Comprehensive metabolic panel   Urinalysis, Routine w reflex microscopic (Completed)   Needs flu shot       Flu shot given today.   Relevant Orders   Flu Vaccine Trivalent High Dose (Fluad) (Completed)        Preventative Services:  Health Risk Assessment and Personalized Prevention Plan: Done today Bone Mass Measurements: up to date Breast Cancer Screening: up to date CVD Screening: done today Cervical Cancer Screening: N/A Colon Cancer Screening: Has cologuard at home and will do Depression Screening: done today Diabetes Screening: done today Glaucoma Screening: see your eye doctor Hepatitis B vaccine: N/A Hepatitis C screening: Up to date HIV Screening: up to date Flu Vaccine: given today Lung cancer Screening: N/A Obesity Screening: done today Pneumonia Vaccines (2): up to date STI Screening: N/A  Follow up plan: Return in about 4 weeks (around 10/03/2023).   LABORATORY TESTING:  - Pap smear: not applicable  IMMUNIZATIONS:   - Tdap: Tetanus vaccination status reviewed: last tetanus booster within 10 years. - Influenza: Administered today - Pneumovax: Up to date -  Prevnar: Up to date - Zostavax vaccine: Refused  SCREENING: -Mammogram: Up to date  - Colonoscopy: Has cologuard at home and will do  - Bone Density: Up to date   PATIENT COUNSELING:   Advised to take 1 mg of folate supplement per day if capable of pregnancy.   Sexuality: Discussed sexually transmitted diseases, partner selection, use of condoms, avoidance of unintended pregnancy  and contraceptive alternatives.   Advised to avoid cigarette smoking.  I discussed with the patient that most people either abstain from alcohol or drink within safe limits (<=14/week and <=4 drinks/occasion for males, <=7/weeks and <= 3 drinks/occasion for females) and that the risk for alcohol disorders and other health effects rises proportionally with the number of drinks per week and how often a drinker exceeds daily limits.  Discussed cessation/primary prevention of drug use and availability of treatment for abuse.   Diet: Encouraged to adjust caloric intake to maintain  or achieve ideal body weight, to reduce intake of dietary saturated fat and total fat, to limit sodium intake by avoiding high sodium foods and not adding table salt, and to maintain adequate dietary potassium and calcium preferably from fresh fruits, vegetables, and low-fat dairy products.    stressed the importance of regular exercise  Injury prevention: Discussed safety belts, safety helmets, smoke detector, smoking near bedding or upholstery.   Dental health: Discussed importance of regular tooth brushing, flossing, and dental visits.  NEXT PREVENTATIVE PHYSICAL DUE IN 1 YEAR. Return in about 4 weeks (around 10/03/2023).

## 2023-09-05 NOTE — Assessment & Plan Note (Signed)
Encouraged diet and exercise with goal of losing 1-2lbs per week. Call with any concerns.  

## 2023-09-05 NOTE — Assessment & Plan Note (Signed)
Rechecking labs today. Await results. Treat as needed.  °

## 2023-09-05 NOTE — Assessment & Plan Note (Signed)
Running slightly high. Will work on [employer] and recheck in about a month. Refills given. Labs drawn today.

## 2023-09-05 NOTE — Patient Instructions (Signed)
Preventative Services:  Health Risk Assessment and Personalized Prevention Plan: Done today Bone Mass Measurements: up to date Breast Cancer Screening: up to date CVD Screening: done today Cervical Cancer Screening: N/A Colon Cancer Screening: Has cologuard at home and will do Depression Screening: done today Diabetes Screening: done today Glaucoma Screening: see your eye doctor Hepatitis B vaccine: N/A Hepatitis C screening: Up to date HIV Screening: up to date Flu Vaccine: given today Lung cancer Screening: N/A Obesity Screening: done today Pneumonia Vaccines (2): up to date STI Screening: N/A

## 2023-09-05 NOTE — Progress Notes (Signed)
Called and discussed results with patient. Will get her into GYN for endometrial biopsy ASAP. She is aware of results.

## 2023-09-06 LAB — LIPID PANEL W/O CHOL/HDL RATIO
Cholesterol, Total: 228 mg/dL — ABNORMAL HIGH (ref 100–199)
HDL: 48 mg/dL (ref >39)
LDL Chol Calc (NIH): 159 mg/dL — ABNORMAL HIGH (ref 0–99)
Triglycerides: 119 mg/dL (ref 0–149)
VLDL Cholesterol Cal: 21 mg/dL (ref 5–40)

## 2023-09-06 LAB — TSH: TSH: 2.12 u[IU]/mL (ref 0.450–4.500)

## 2023-09-06 LAB — COMPREHENSIVE METABOLIC PANEL
ALT: 15 [IU]/L (ref 0–32)
AST: 15 [IU]/L (ref 0–40)
Albumin: 3.7 g/dL — ABNORMAL LOW (ref 3.9–4.9)
Alkaline Phosphatase: 112 [IU]/L (ref 44–121)
BUN/Creatinine Ratio: 15 (ref 12–28)
BUN: 14 mg/dL (ref 8–27)
Bilirubin Total: 0.3 mg/dL (ref 0.0–1.2)
CO2: 27 mmol/L (ref 20–29)
Calcium: 9.4 mg/dL (ref 8.7–10.3)
Chloride: 98 mmol/L (ref 96–106)
Creatinine, Ser: 0.91 mg/dL (ref 0.57–1.00)
Globulin, Total: 2.9 g/dL (ref 1.5–4.5)
Glucose: 98 mg/dL (ref 70–99)
Potassium: 3.7 mmol/L (ref 3.5–5.2)
Sodium: 141 mmol/L (ref 134–144)
Total Protein: 6.6 g/dL (ref 6.0–8.5)
eGFR: 69 mL/min/{1.73_m2} (ref >59)

## 2023-09-06 LAB — CBC WITH DIFFERENTIAL/PLATELET
Basophils Absolute: 0 10*3/uL (ref 0.0–0.2)
Basos: 0 %
EOS (ABSOLUTE): 0.2 10*3/uL (ref 0.0–0.4)
Eos: 2 %
Hematocrit: 37.9 % (ref 34.0–46.6)
Hemoglobin: 11.7 g/dL (ref 11.1–15.9)
Immature Grans (Abs): 0 10*3/uL (ref 0.0–0.1)
Immature Granulocytes: 0 %
Lymphocytes Absolute: 1.3 10*3/uL (ref 0.7–3.1)
Lymphs: 14 %
MCH: 26.9 pg (ref 26.6–33.0)
MCHC: 30.9 g/dL — ABNORMAL LOW (ref 31.5–35.7)
MCV: 87 fL (ref 79–97)
Monocytes Absolute: 0.4 10*3/uL (ref 0.1–0.9)
Monocytes: 5 %
Neutrophils Absolute: 7.1 10*3/uL — ABNORMAL HIGH (ref 1.4–7.0)
Neutrophils: 79 %
Platelets: 318 10*3/uL (ref 150–450)
RBC: 4.35 x10E6/uL (ref 3.77–5.28)
RDW: 14.1 % (ref 11.7–15.4)
WBC: 9 10*3/uL (ref 3.4–10.8)

## 2023-09-06 LAB — URIC ACID: Uric Acid: 5.6 mg/dL (ref 3.0–7.2)

## 2023-09-11 ENCOUNTER — Other Ambulatory Visit: Payer: Self-pay | Admitting: Family Medicine

## 2023-09-11 MED ORDER — LEVOTHYROXINE SODIUM 200 MCG PO TABS: 200.0000 ug | ORAL_TABLET | Freq: Every day | ORAL | 3 refills | Status: DC

## 2023-09-19 ENCOUNTER — Ambulatory Visit (INDEPENDENT_AMBULATORY_CARE_PROVIDER_SITE_OTHER): Payer: Medicare Other | Admitting: Obstetrics and Gynecology

## 2023-09-19 ENCOUNTER — Other Ambulatory Visit (HOSPITAL_COMMUNITY)
Admission: RE | Admit: 2023-09-19 | Discharge: 2023-09-19 | Disposition: A | Discharge status: Home / Self Care | Payer: Medicare Other | Source: Ambulatory Visit | Attending: Obstetrics and Gynecology | Admitting: Obstetrics and Gynecology

## 2023-09-19 ENCOUNTER — Encounter: Payer: Self-pay | Admitting: Obstetrics and Gynecology

## 2023-09-19 VITALS — BP 145/82 | HR 61 | Ht 62.0 in | Wt 312.1 lb

## 2023-09-19 DIAGNOSIS — R9389 Abnormal findings on diagnostic imaging of other specified body structures: Secondary | ICD-10-CM | POA: Diagnosis present

## 2023-09-19 DIAGNOSIS — D219 Benign neoplasm of connective and other soft tissue, unspecified: Secondary | ICD-10-CM

## 2023-09-19 DIAGNOSIS — Z7689 Persons encountering health services in other specified circumstances: Secondary | ICD-10-CM

## 2023-09-19 DIAGNOSIS — N950 Postmenopausal bleeding: Secondary | ICD-10-CM | POA: Diagnosis not present

## 2023-09-19 DIAGNOSIS — D259 Leiomyoma of uterus, unspecified: Secondary | ICD-10-CM

## 2023-09-19 DIAGNOSIS — N8502 Endometrial intraepithelial neoplasia [EIN]: Secondary | ICD-10-CM

## 2023-09-19 NOTE — Progress Notes (Signed)
HPI:      [name] is a 67 y.o. [phone] who LMP was Patient's last menstrual period was 03/17/2009 (approximate).  Subjective:   She presents today stating that in November she had approximately 7 days of very light spotting.  She subsequently had an ultrasound showing an endometrium of 12 mm.  She has had no bleeding since that time.  Based on her age and her previous lack of menses she is obviously postmenopausal.  She presents today for endometrial biopsy.  Significant note she has a history of triple negative breast cancer.    Hx: The following portions of the patient's history were reviewed and updated as appropriate:             She  has a past medical history of Arthritis, Atrial fibrillation (HCC), Breast cancer (HCC) (04/2018), Dysrhythmia, Hyperlipidemia, Hypertension, Hypotension due to drugs (05/30/2018), Hypothyroidism, Lumbago, Osteoporosis, Personal history of chemotherapy, Personal history of radiation therapy, and Thyroid disease. She does not have any pertinent problems on file. She  has a past surgical history that includes Replacement total knee; Joint replacement (Left); Portacath placement (Right, 05/11/2018); Breast lumpectomy with needle localization and axillary sentinel lymph node bx (Right, 10/29/2018); Breast lumpectomy (Right, 10/29/2018); Breast biopsy (Right, 05/02/2018); and Cataract extraction (Bilateral). Her family history includes Breast cancer in her maternal grandmother; Breast cancer (age of onset: 52) in an other family member; Cancer (age of onset: 31) in her maternal uncle; Congestive Heart Failure in her mother; Diabetes in her mother and another family member; Hypertension in an other family member; Kidney disease in an other family member; Other in her father; Thyroid disease in an other family member. She  reports that she has never smoked. She has never used smokeless tobacco. She reports that she does not drink alcohol and does not use  drugs. She has a current medication list which includes the following prescription(s): acetaminophen, allopurinol, apixaban, epinephrine, flecainide, levothyroxine, lisinopril-hydrochlorothiazide, and metoprolol succinate. She is allergic to gabapentin, morphine and codeine, peanut-containing drug products, and tape.       Review of Systems:  Review of Systems  Constitutional: Denied constitutional symptoms, night sweats, recent illness, fatigue, fever, insomnia and weight loss.  Eyes: Denied eye symptoms, eye pain, photophobia, vision change and visual disturbance.  Ears/Nose/Throat/Neck: Denied ear, nose, throat or neck symptoms, hearing loss, nasal discharge, sinus congestion and sore throat.  Cardiovascular: Denied cardiovascular symptoms, arrhythmia, chest pain/pressure, edema, exercise intolerance, orthopnea and palpitations.  Respiratory: Denied pulmonary symptoms, asthma, pleuritic pain, productive sputum, cough, dyspnea and wheezing.  Gastrointestinal: Denied, gastro-esophageal reflux, melena, nausea and vomiting.  Genitourinary: See HPI for additional information.  Musculoskeletal: Denied musculoskeletal symptoms, stiffness, swelling, muscle weakness and myalgia.  Dermatologic: Denied dermatology symptoms, rash and scar.  Neurologic: Denied neurology symptoms, dizziness, headache, neck pain and syncope.  Psychiatric: Denied psychiatric symptoms, anxiety and depression.  Endocrine: Denied endocrine symptoms including hot flashes and night sweats.   Meds:   Current Outpatient Medications on File Prior to Visit  Medication Sig Dispense Refill   acetaminophen (TYLENOL) 500 MG tablet Take 500 mg by mouth every 6 (six) hours as needed.     allopurinol (ZYLOPRIM) 300 MG tablet Take 1 tablet (300 mg total) by mouth daily. 90 tablet 1   apixaban (ELIQUIS) 5 MG TABS tablet Take 5 mg by mouth in the morning and at bedtime.     EPINEPHrine 0.3 mg/0.3 mL IJ SOAJ injection Inject 0.3 mg into  the muscle as needed for  anaphylaxis. 1 each 12   flecainide (TAMBOCOR) 100 MG tablet Take 100 mg by mouth 2 (two) times daily.     levothyroxine (SYNTHROID) 200 MCG tablet Take 1 tablet (200 mcg total) by mouth daily before breakfast. 90 tablet 3   lisinopril-hydrochlorothiazide (ZESTORETIC) 20-25 MG tablet Take 2 tablets by mouth daily. 180 tablet 1   metoprolol succinate (TOPROL-XL) 50 MG 24 hr tablet Take with or immediately following a meal. 90 tablet 1   No current facility-administered medications on file prior to visit.      Objective:     Vitals:   09/19/23 1441  BP: (!) 145/82  Pulse: 61   Filed Weights   09/19/23 1441  Weight: (!) 312 lb 1.6 oz (141.6 kg)              Physical examination   Pelvic:   Vulva: Normal appearance.  No lesions.  Vagina: No lesions or abnormalities noted.  Support: Normal pelvic support.  Urethra No masses tenderness or scarring.  Meatus Normal size without lesions or prolapse.  Cervix: Normal appearance.  No lesions.  Anus: Normal exam.  No lesions.  Perineum: Normal exam.  No lesions.        Bimanual   Uterus: Normal size.  Non-tender.  Mobile.  AV.  Adnexae: No masses.  Non-tender to palpation.  Cul-de-sac: Negative for abnormality.   Endometrial Biopsy After discussion with the patient regarding her abnormal uterine bleeding I recommended that she proceed with an endometrial biopsy for further diagnosis. The risks, benefits, alternatives, and indications for an endometrial biopsy were discussed with the patient in detail. She understood the risks including infection, bleeding, cervical laceration and uterine perforation.  Verbal consent was obtained.   PROCEDURE NOTE:  Vacurette endometrial biopsy was performed using aseptic technique with iodine preparation.  The uterus was sounded to a length of 11 cm.  Adequate sampling was obtained with minimal blood loss.  The patient tolerated the procedure well.  Disposition will be pending  pathology           Assessment:    G4P0013 Patient Active Problem List   Diagnosis Date Noted   Hallucination 05/15/2023   Acute gout involving toe of right foot 03/08/2021   Hypokalemia 11/05/2020   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   Malignant neoplasm of lower-outer quadrant of right female breast (HCC) 05/08/2018   Goals of care, counseling/discussion 05/08/2018   Morbid obesity (HCC) 03/06/2018   Allergy to peanuts 02/06/2018   Thyroid activity decreased 02/26/2016   Paroxysmal atrial fibrillation (HCC) 01/22/2016   Primary osteoarthritis of right knee 09/22/2015   Combined fat and carbohydrate induced hyperlipemia 04/02/2015   Benign essential hypertension 04/02/2015   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 Establishing care with new doctor, encounter for   2. Thickened endometrium   3. Fibroid   4. Postmenopausal bleeding   5. Morbid obesity (HCC)     Bleeding likely secondary to vaginal atrophy but with a slightly thickened endometrium hyperplasia and malignancy cannot be ruled out.   Plan:            1.  Await endometrial biopsy results and decide management after they return. Orders No orders of the defined types were placed in this encounter.   No orders of the defined types were placed in this encounter.     F/U  Return for We will contact her with any abnormal test results. I spent 34 sounds like dinner at  the hospital to be minutes involved in the care of this patient preparing to see the patient by obtaining and reviewing her medical history (including labs, imaging tests and prior procedures), documenting clinical information in the electronic health record (EHR), counseling and coordinating care plans, writing and sending prescriptions, ordering tests or procedures and in direct communicating with the patient and medical staff discussing pertinent items from her history and physical exam.  Elonda Husky,  M.D. 09/19/2023 3:37 PM

## 2023-09-19 NOTE — Progress Notes (Signed)
Patient presents today to discuss recent ultrasound results. She states having approximately 7 days of light spotting and contacting her PCP. Patient had recent ultrasound on 12/3 showing thickened endometrium and fibroid. Reports she is no longer bleeding at this time.

## 2023-09-20 ENCOUNTER — Other Ambulatory Visit: Payer: Self-pay | Admitting: Family Medicine

## 2023-09-21 LAB — SURGICAL PATHOLOGY

## 2023-09-21 NOTE — Telephone Encounter (Signed)
Requested Prescriptions  Refused Prescriptions Disp Refills   levothyroxine (SYNTHROID) 200 MCG tablet [Pharmacy Med Name: Levothyroxine Sodium 200 MCG Oral Tablet] 90 tablet 0    Sig: TAKE 1 TABLET BY MOUTH ONCE DAILY BEFORE BREAKFAST     Endocrinology:  Hypothyroid Agents Passed - 09/21/2023  8:12 AM      Passed - TSH in normal range and within 360 days    TSH  Date Value Ref Range Status  09/05/2023 2.120 0.450 - 4.500 uIU/mL Final         Passed - Valid encounter within last 12 months    Recent Outpatient Visits           2 weeks ago Encounter for [employer] annual wellness exam   [facility] [place], Megan P, DO   6 months ago Benign essential hypertension   [facility] [place], [place], DO   1 year ago Benign essential hypertension   [facility] [place], [place], DO   1 year ago Benign essential hypertension   [place] [place], Megan P, DO   2 years ago Pneumonia due to influenza A virus   [facility] [place], Oralia Rud, DO       Future Appointments             In 2 weeks La[place] Benes, Oralia Rud, DO [facility]

## 2023-09-22 LAB — NM MYOCAR MULTI W/SPECT W/WALL MOTION / EF
Estimated workload: 1
Exercise duration (min): 1 min
LV dias vol: 112 mL (ref 46–106)
LV sys vol: 37 mL
MPHR: 152 {beats}/min
Nuc Stress EF: 67 %
Peak HR: 76 {beats}/min
Percent HR: 50 %
Rest HR: 68 {beats}/min
Rest Nuclear Isotope Dose: 29.6 mCi
SDS: 3
SRS: 5
SSS: 8
Stress Nuclear Isotope Dose: 29.3 mCi
TID: 1.34

## 2023-10-03 ENCOUNTER — Encounter: Payer: Self-pay | Admitting: Obstetrics and Gynecology

## 2023-10-03 ENCOUNTER — Telehealth (INDEPENDENT_AMBULATORY_CARE_PROVIDER_SITE_OTHER): Payer: Medicare Other | Admitting: Obstetrics and Gynecology

## 2023-10-03 DIAGNOSIS — N8502 Endometrial intraepithelial neoplasia [EIN]: Secondary | ICD-10-CM | POA: Diagnosis not present

## 2023-10-03 NOTE — Progress Notes (Signed)
 Virtual Visit via Video Note  I connected with Wendy Marsh on 10/03/23 at  7:35 AM EST by video and verified that I was speaking with the correct person using two identifiers.    Wendy Marsh is a 66 y.o. [phone] who LMP was Patient's last menstrual period was 03/17/2009 (approximate). I discussed the limitations, risks, security and privacy concerns of performing an evaluation and management service by video and the availability of in person appointments. I also discussed with the patient that there may be a patient responsible charge related to this service. The patient expressed understanding and agreed to proceed.  Location of patient:  Home  Patient gave explicit verbal consent for video visit:  YES  Location of provider:  [facility] office  Persons other than physician and patient involved in provider conference:  None   Subjective:   History of Present Illness:    She has a history of postmenopausal bleeding and underwent endometrial biopsy.  She presents today by video to discuss the results of the biopsy.  Hx: The following portions of the patient's history were reviewed and updated as appropriate:             She  has a past medical history of Arthritis, Atrial fibrillation (HCC), Breast cancer (HCC) (04/2018), Dysrhythmia, Hyperlipidemia, Hypertension, Hypotension due to drugs (05/30/2018), Hypothyroidism, Lumbago, Osteoporosis, Personal history of chemotherapy, Personal history of radiation therapy, and Thyroid  disease. She does not have any pertinent problems on file. She  has a past surgical history that includes Replacement total knee; Joint replacement (Left); Portacath placement (Right, 05/11/2018); Breast lumpectomy with needle localization and axillary sentinel lymph node bx (Right, 10/29/2018); Breast lumpectomy (Right, 10/29/2018); Breast biopsy (Right, 05/02/2018); and Cataract extraction (Bilateral). Her family history includes Breast cancer in her maternal  grandmother; Breast cancer (age of onset: 41) in an other family member; Cancer (age of onset: 72) in her maternal uncle; Congestive Heart Failure in her mother; Diabetes in her mother and another family member; Hypertension in an other family member; Kidney disease in an other family member; Other in her father; Thyroid  disease in an other family member. She  reports that she has never smoked. She has never used smokeless tobacco. She reports that she does not drink alcohol and does not use drugs. She has a current medication list which includes the following prescription(s): acetaminophen , allopurinol , apixaban, epinephrine , flecainide, levothyroxine , lisinopril -hydrochlorothiazide , and metoprolol  succinate. She is allergic to gabapentin , morphine and codeine, peanut-containing drug products, and tape.       Review of Systems:  Review of Systems  Constitutional: Denied constitutional symptoms, night sweats, recent illness, fatigue, fever, insomnia and weight loss.  Eyes: Denied eye symptoms, eye pain, photophobia, vision change and visual disturbance.  Ears/Nose/Throat/Neck: Denied ear, nose, throat or neck symptoms, hearing loss, nasal discharge, sinus congestion and sore throat.  Cardiovascular: Denied cardiovascular symptoms, arrhythmia, chest pain/pressure, edema, exercise intolerance, orthopnea and palpitations.  Respiratory: Denied pulmonary symptoms, asthma, pleuritic pain, productive sputum, cough, dyspnea and wheezing.  Gastrointestinal: Denied, gastro-esophageal reflux, melena, nausea and vomiting.  Genitourinary: See HPI for additional information.  Musculoskeletal: Denied musculoskeletal symptoms, stiffness, swelling, muscle weakness and myalgia.  Dermatologic: Denied dermatology symptoms, rash and scar.  Neurologic: Denied neurology symptoms, dizziness, headache, neck pain and syncope.  Psychiatric: Denied psychiatric symptoms, anxiety and depression.  Endocrine: Denied endocrine  symptoms including hot flashes and night sweats.   Meds:   Current Outpatient Medications on File Prior to Visit  Medication Sig Dispense Refill  acetaminophen  (TYLENOL ) 500 MG tablet Take 500 mg by mouth every 6 (six) hours as needed.     allopurinol  (ZYLOPRIM ) 300 MG tablet Take 1 tablet (300 mg total) by mouth daily. 90 tablet 1   apixaban (ELIQUIS) 5 MG TABS tablet Take 5 mg by mouth in the morning and at bedtime.     EPINEPHrine  0.3 mg/0.3 mL IJ SOAJ injection Inject 0.3 mg into the muscle as needed for anaphylaxis. 1 each 12   flecainide (TAMBOCOR) 100 MG tablet Take 100 mg by mouth 2 (two) times daily.     levothyroxine  (SYNTHROID ) 200 MCG tablet Take 1 tablet (200 mcg total) by mouth daily before breakfast. 90 tablet 3   lisinopril -hydrochlorothiazide  (ZESTORETIC ) 20-25 MG tablet Take 2 tablets by mouth daily. 180 tablet 1   metoprolol  succinate (TOPROL -XL) 50 MG 24 hr tablet Take with or immediately following a meal. 90 tablet 1   No current facility-administered medications on file prior to visit.    Assessment:    G4P0013 Patient Active Problem List   Diagnosis Date Noted   Hallucination 05/15/2023   Acute gout involving toe of right foot 03/08/2021   Hypokalemia 11/05/2020   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   Malignant neoplasm of lower-outer quadrant of right female breast (HCC) 05/08/2018   Goals of care, counseling/discussion 05/08/2018   Morbid obesity (HCC) 03/06/2018   Allergy to peanuts 02/06/2018   Thyroid  activity decreased 02/26/2016   Paroxysmal atrial fibrillation (HCC) 01/22/2016   Primary osteoarthritis of right knee 09/22/2015   Combined fat and carbohydrate induced hyperlipemia 04/02/2015   Benign essential hypertension 04/02/2015   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 Endometrial intraepithelial neoplasia (EIN)       Plan:            1.  We discussed EIN in detail.  The fact that it is contained within a polyp  was also discussed.  Multiple options were discussed with the patient including hysterectomy, D&C with IUD placement, IUD placement alone with subsequent endometrial biopsies, and use of Megace with subsequent endometrial biopsies.  After discussing EIN and endometrial polyps she has decided upon D&C with IUD placement in the OR. All of her questions were answered.  She will schedule a preop in the near future. Orders No orders of the defined types were placed in this encounter.   No orders of the defined types were placed in this encounter.     F/U  Return in about 2 weeks (around 10/17/2023).  Alm DOROTHA Sar, M.D. 10/03/2023 7:55 AM

## 2023-10-09 ENCOUNTER — Ambulatory Visit: Payer: Medicare Other | Admitting: Family Medicine

## 2023-10-24 ENCOUNTER — Encounter: Payer: Self-pay | Admitting: Family Medicine

## 2023-10-24 ENCOUNTER — Ambulatory Visit (INDEPENDENT_AMBULATORY_CARE_PROVIDER_SITE_OTHER): Payer: Medicare Other | Admitting: Family Medicine

## 2023-10-24 VITALS — BP 145/80 | HR 60 | Temp 97.8°F | Wt 308.8 lb

## 2023-10-24 DIAGNOSIS — I10 Essential (primary) hypertension: Secondary | ICD-10-CM

## 2023-10-24 MED ORDER — AMLODIPINE BESYLATE 2.5 MG PO TABS: 1.2500 mg | ORAL_TABLET | Freq: Every day | ORAL | 2 refills | Status: DC

## 2023-10-24 NOTE — Assessment & Plan Note (Signed)
Still running high- will add in amlodipine and recheck 6 weeks. Interested in changing lisinopril to losartan when she's due for a refill.

## 2023-10-24 NOTE — Progress Notes (Signed)
BP (!) 145/80   Pulse 60   Temp 97.8 F (36.6 C) (Oral)   Wt (!) 308 lb 12.8 oz (140.1 kg)   LMP 03/17/2009 (Approximate)   SpO2 97%   BMI 56.48 kg/m    Subjective:    Patient ID: Wendy Marsh, female    DOB: 09-11-1956, 68 y.o.   MRN: [id number]  HPI: Wendy Marsh is a 69 y.o. female  Chief Complaint  Patient presents with   Hypertension   HYPERTENSION  Hypertension status: uncontrolled  Satisfied with current treatment? yes Duration of hypertension: chronic BP monitoring frequency:  not checking BP medication side effects:  no Medication compliance: excellent compliance Previous BP meds: lisinopril, hydrochlorothiazide, metoprolol Aspirin: no Recurrent headaches: no Visual changes: no Palpitations: no Dyspnea: no Chest pain: no Lower extremity edema: no Dizzy/lightheaded: no   Relevant past medical, surgical, family and social history reviewed and updated as indicated. Interim medical history since our last visit reviewed. Allergies and medications reviewed and updated.  Review of Systems  Constitutional: Negative.   Respiratory: Negative.    Cardiovascular: Negative.   Musculoskeletal: Negative.   Neurological: Negative.   Psychiatric/Behavioral: Negative.      Per HPI unless specifically indicated above     Objective:    BP (!) 145/80   Pulse 60   Temp 97.8 F (36.6 C) (Oral)   Wt (!) 308 lb 12.8 oz (140.1 kg)   LMP 03/17/2009 (Approximate)   SpO2 97%   BMI 56.48 kg/m   Wt Readings from Last 3 Encounters:  10/24/23 (!) 308 lb 12.8 oz (140.1 kg)  09/19/23 (!) 312 lb 1.6 oz (141.6 kg)  09/05/23 (!) 311 lb 9.6 oz (141.3 kg)    Physical Exam Vitals and nursing note reviewed.  Constitutional:      General: She is not in acute distress.    Appearance: Normal appearance. She is obese. She is not ill-appearing, toxic-appearing or diaphoretic.  HENT:     Head: Normocephalic and atraumatic.     Right Ear: External ear normal.      Left Ear: External ear normal.     Nose: Nose normal.     Mouth/Throat:     Mouth: Mucous membranes are moist.     Pharynx: Oropharynx is clear.  Eyes:     General: No scleral icterus.       Right eye: No discharge.        Left eye: No discharge.     Extraocular Movements: Extraocular movements intact.     Conjunctiva/sclera: Conjunctivae normal.     Pupils: Pupils are equal, round, and reactive to light.  Cardiovascular:     Rate and Rhythm: Normal rate and regular rhythm.     Pulses: Normal pulses.     Heart sounds: Normal heart sounds. No murmur heard.    No friction rub. No gallop.  Pulmonary:     Effort: Pulmonary effort is normal. No respiratory distress.     Breath sounds: Normal breath sounds. No stridor. No wheezing, rhonchi or rales.  Chest:     Chest wall: No tenderness.  Musculoskeletal:        General: Normal range of motion.     Cervical back: Normal range of motion and neck supple.  Skin:    General: Skin is warm and dry.     Capillary Refill: Capillary refill takes less than 2 seconds.     Coloration: Skin is not jaundiced or pale.     Findings:  No bruising, erythema, lesion or rash.  Neurological:     General: No focal deficit present.     Mental Status: She is alert and oriented to person, place, and time. Mental status is at baseline.  Psychiatric:        Mood and Affect: Mood normal.        Behavior: Behavior normal.        Thought Content: Thought content normal.        Judgment: Judgment normal.     Results for orders placed or performed in visit on 09/19/23  Surgical pathology   Collection Time: 09/19/23  3:27 PM  Result Value Ref Range   SURGICAL PATHOLOGY      SURGICAL PATHOLOGY CASE: [id number] PATIENT: [facility] Surgical Pathology Report     Clinical History: thickened endometrium, PMB (cm)     FINAL MICROSCOPIC DIAGNOSIS:  A. 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  Note: Dr. Kenard Gower has peer-reviewed the case and agrees with the interpretation.  Dr. Logan Bores nurse was notified of the diagnosis on 09/21/23.    GROSS DESCRIPTION:  A.  Received in formalin is a 2.5 x 2.0 x 0.3 cm aggregate of mucoid material and hemorrhagic soft tissue fragments submitted entirely in block A1.  SMB 09/20/23  Final Diagnosis performed by Orene Desanctis DO.   Electronically signed 09/21/2023 Technical component performed at [employer], [address] [place], [place] [address].  Professional component performed at [facility], [address], [place], [place] [address].  Immunohistochemistry Technical component ( if applicable) was performed at [facility]. [address], [place], [place] [address].   IMMUNOHISTOCHEMISTRY DISCLAIMER (if applicable): Some of these immunohistochemical stains may have been developed and the performance characteristics determine by [facility]. Some may not have been cleared or approved by the U.S. Food and Drug Administration. The FDA has determined that such clearance or approval is not necessary. This test is used for clinical purposes. It should not be regarded as investigational or for research. This laboratory is certified under the Clinical Laboratory Improvement Amendments of 1988 (CLIA-88) as qualified to perform high complexity clinical laboratory testing.  The controls stained appropriately.   IHC stains are performed on formalin fixed, paraffin embedded tissue using a 3,3"diaminobenzidine (DAB) chromogen and Leica Bond Autostainer System. The staining intensity of the nucleus is score manually and  is reported as the percentage of tumor cell nuclei demonstrating specific nuclear staining. The specimens are fixed in 10% Neutral Formalin for at least 6 hours and up to 72hrs. These tests are validated on decalcified  tissue. Results should be interpreted with caution given the possibility of false negative results on decalcified specimens. Antibody Clones are as follows ER-clone [id number], PR-clone 16, Ki67- clone [id number]. Some of these immunohistochemical stains may have been developed and the performance characteristics determined by [facility].       Assessment & Plan:   Problem List Items Addressed This Visit       Cardiovascular and Mediastinum   Benign essential hypertension - Primary   Still running high- will add in amlodipine and recheck 6 weeks. Interested in changing lisinopril to losartan when she's due for a refill.       Relevant Medications   amLODipine (NORVASC) 2.5 MG tablet     Follow up plan: Return in about 6 weeks (around 12/05/2023).

## 2023-11-01 ENCOUNTER — Ambulatory Visit (INDEPENDENT_AMBULATORY_CARE_PROVIDER_SITE_OTHER): Payer: Medicare Other | Admitting: Obstetrics and Gynecology

## 2023-11-01 ENCOUNTER — Encounter: Payer: Self-pay | Admitting: Obstetrics and Gynecology

## 2023-11-01 VITALS — BP 121/74 | HR 61 | Ht 62.0 in | Wt 309.2 lb

## 2023-11-01 DIAGNOSIS — Z01818 Encounter for other preprocedural examination: Secondary | ICD-10-CM | POA: Diagnosis not present

## 2023-11-01 DIAGNOSIS — N8502 Endometrial intraepithelial neoplasia [EIN]: Secondary | ICD-10-CM

## 2023-11-01 DIAGNOSIS — N840 Polyp of corpus uteri: Secondary | ICD-10-CM

## 2023-11-01 NOTE — Progress Notes (Signed)
Patient presents today for a pre-op exam prior to [facility] with IUD Insertion. She states no additional concerns today.

## 2023-11-01 NOTE — Progress Notes (Signed)
PRE-OPERATIVE HISTORY AND PHYSICAL EXAM  PCP:  Dorcas Carrow, DO Subjective:   HPI:  Wendy Marsh is a 68 y.o. [phone].  Patient's last menstrual period was 03/17/2009 (approximate).  She presents today for a pre-op discussion and PE.  She has the following symptoms:  EIN - Polyp  Review of Systems:   Constitutional: Denied constitutional symptoms, night sweats, recent illness, fatigue, fever, insomnia and weight loss.  Eyes: Denied eye symptoms, eye pain, photophobia, vision change and visual disturbance.  Ears/Nose/Throat/Neck: Denied ear, nose, throat or neck symptoms, hearing loss, nasal discharge, sinus congestion and sore throat.  Cardiovascular: Denied cardiovascular symptoms, arrhythmia, chest pain/pressure, edema, exercise intolerance, orthopnea and palpitations.  Respiratory: Denied pulmonary symptoms, asthma, pleuritic pain, productive sputum, cough, dyspnea and wheezing.  Gastrointestinal: Denied, gastro-esophageal reflux, melena, nausea and vomiting.  Genitourinary: See HPI for additional information.  See  Musculoskeletal: Denied musculoskeletal symptoms, stiffness, swelling, muscle weakness and myalgia.  Dermatologic: Denied dermatology symptoms, rash and scar.  Neurologic: Denied neurology symptoms, dizziness, headache, neck pain and syncope.  Psychiatric: Denied psychiatric symptoms, anxiety and depression.  Endocrine: Denied endocrine symptoms including hot flashes and night sweats.   OB History  Gravida Para Term Preterm AB Living  4    1 3   SAB IAB Ectopic Multiple Live Births  1    3    # Outcome Date GA Lbr Len/2nd Weight Sex Type Anes PTL Lv  4 Gravida      Vag-Spont   LIV  3 Gravida      Vag-Spont   LIV  2 Gravida      Vag-Spont   LIV  1 SAB             Obstetric Comments  1st Menstrual Cycle:  12  1st Pregnancy: 15    Past Medical History:  Diagnosis Date   Arthritis    Atrial fibrillation (HCC)    Breast cancer (HCC) 04/2018    right breast   Dysrhythmia    afib   Hyperlipidemia    Hypertension    Hypotension due to drugs 05/30/2018   Hypothyroidism    Lumbago    Osteoporosis    Personal history of chemotherapy    Personal history of radiation therapy    Thyroid disease     Past Surgical History:  Procedure Laterality Date   BREAST BIOPSY Right 05/02/2018   Invasive mammary carcinoma, grade 3, neo adj chemo   BREAST LUMPECTOMY Right 10/29/2018   NO RESIDUAL MALIGNANCY IDENTIFIED.    BREAST LUMPECTOMY WITH NEEDLE LOCALIZATION AND AXILLARY SENTINEL LYMPH NODE BX Right 10/29/2018   Procedure: BREAST LUMPECTOMY WITH NEEDLE LOCALIZATION AND SENTINEL LYMPH NODE BX;  Surgeon: Ancil Linsey, MD;  Location: [facility];  Service: General;  Laterality: Right;   CATARACT EXTRACTION Bilateral    JOINT REPLACEMENT Left    tkr   PORTACATH PLACEMENT Right 05/11/2018   Procedure: INSERTION  I.J PORT-A-CATH;  Surgeon: Earline Mayotte, MD;  Location: [facility];  Service: General;  Laterality: Right;   REPLACEMENT TOTAL KNEE        SOCIAL HISTORY:  Social History   Tobacco Use  Smoking Status Never  Smokeless Tobacco Never   Social History   Substance and Sexual Activity  Alcohol Use No    Social History   Substance and Sexual Activity  Drug Use No    Family History  Problem Relation Age of Onset   Diabetes Mother  Congestive Heart Failure Mother    Breast cancer Maternal Grandmother        dx 89s; deceased 24s   Cancer Maternal Uncle 25       deceased 40s   Other Father        No information on father or paternal relatives   Breast cancer Other 42       paternal half-sister; also esophageal ca at 1; currently 7   Diabetes Other    Hypertension Other    Kidney disease Other    Thyroid disease Other     ALLERGIES:  Gabapentin, Morphine and codeine, Peanut-containing drug products, and Tape  MEDS:   Current Outpatient Medications on File Prior to Visit  Medication Sig Dispense  Refill   acetaminophen (TYLENOL) 500 MG tablet Take 500 mg by mouth every 6 (six) hours as needed.     allopurinol (ZYLOPRIM) 300 MG tablet Take 1 tablet (300 mg total) by mouth daily. 90 tablet 1   amLODipine (NORVASC) 2.5 MG tablet Take 0.5 tablets (1.25 mg total) by mouth daily. 15 tablet 2   apixaban (ELIQUIS) 5 MG TABS tablet Take 5 mg by mouth in the morning and at bedtime.     EPINEPHrine 0.3 mg/0.3 mL IJ SOAJ injection Inject 0.3 mg into the muscle as needed for anaphylaxis. 1 each 12   flecainide (TAMBOCOR) 100 MG tablet Take 100 mg by mouth 2 (two) times daily.     levothyroxine (SYNTHROID) 200 MCG tablet Take 1 tablet (200 mcg total) by mouth daily before breakfast. 90 tablet 3   lisinopril-hydrochlorothiazide (ZESTORETIC) 20-25 MG tablet Take 2 tablets by mouth daily. 180 tablet 1   metoprolol succinate (TOPROL-XL) 50 MG 24 hr tablet Take with or immediately following a meal. 90 tablet 1   No current facility-administered medications on file prior to visit.    No orders of the defined types were placed in this encounter.    Physical examination BP (!) 151/71   Pulse 70   Ht 5\' 2"  (1.575 m)   Wt (!) 309 lb 3.2 oz (140.3 kg)   LMP 03/17/2009 (Approximate)   BMI 56.55 kg/m   General NAD, Conversant  HEENT Atraumatic; Op clear with mmm.  Normo-cephalic.  Anicteric sclerae  Thyroid/Neck Smooth without nodularity or enlargement. Normal ROM.  Neck Supple.  Skin No rashes, lesions or ulceration. Normal palpated skin turgor. No nodularity.  Breasts: No masses or discharge.  Symmetric.  No axillary adenopathy.  Lungs: Clear to auscultation.No rales or wheezes. Normal Respiratory effort, no retractions.  Heart: NSR.  No murmurs or rubs appreciated. No peripheral edema  Abdomen: Soft.  Non-tender.  No masses.  No HSM. No hernia  Extremities: Moves all appropriately.  Normal ROM for age. No lymphadenopathy.  Neuro: Oriented to PPT.  Normal mood. Normal affect.     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 Pre-op exam   2. Endometrial intraepithelial neoplasia (EIN)   3. Endometrial polyp    She has elected to undergo D&C and IUD placement because of postmenopausal bleeding and EIN within an endometrial polyp.  Patient not a good surgical candidate for extensive surgery like hysterectomy.   Plan:   Orders: No orders of the defined types were placed in this encounter.    1.  Hysteroscopy, D&C, Mirena IUD placement  Pre-op discussions regarding Risks and Benefits of her scheduled surgery.  D&C The procedure and the risks and benefits of dilation and curettage/evacuation have been explained to the patient.  The specific risks of bleeding, infection, anesthesia, uterine perforation, and damage to bowel or bladder  have been specifically discussed.  I have answered all of her questions and I believe that she has an adequate and informed understanding of this procedure.   Elonda Husky, M.D. 11/01/2023 1:46 PM

## 2023-11-01 NOTE — H&P (Signed)
PRE-OPERATIVE HISTORY AND PHYSICAL EXAM  PCP:  Dorcas Carrow, DO Subjective:   HPI:  Wendy Marsh is a 68 y.o. [phone].  Patient's last menstrual period was 03/17/2009 (approximate).  She presents today for a pre-op discussion and PE.  She has the following symptoms:  EIN - Polyp  Review of Systems:   Constitutional: Denied constitutional symptoms, night sweats, recent illness, fatigue, fever, insomnia and weight loss.  Eyes: Denied eye symptoms, eye pain, photophobia, vision change and visual disturbance.  Ears/Nose/Throat/Neck: Denied ear, nose, throat or neck symptoms, hearing loss, nasal discharge, sinus congestion and sore throat.  Cardiovascular: Denied cardiovascular symptoms, arrhythmia, chest pain/pressure, edema, exercise intolerance, orthopnea and palpitations.  Respiratory: Denied pulmonary symptoms, asthma, pleuritic pain, productive sputum, cough, dyspnea and wheezing.  Gastrointestinal: Denied, gastro-esophageal reflux, melena, nausea and vomiting.  Genitourinary: See HPI for additional information.  See  Musculoskeletal: Denied musculoskeletal symptoms, stiffness, swelling, muscle weakness and myalgia.  Dermatologic: Denied dermatology symptoms, rash and scar.  Neurologic: Denied neurology symptoms, dizziness, headache, neck pain and syncope.  Psychiatric: Denied psychiatric symptoms, anxiety and depression.  Endocrine: Denied endocrine symptoms including hot flashes and night sweats.   OB History  Gravida Para Term Preterm AB Living  4    1 3   SAB IAB Ectopic Multiple Live Births  1    3    # Outcome Date GA Lbr Len/2nd Weight Sex Type Anes PTL Lv  4 Gravida      Vag-Spont   LIV  3 Gravida      Vag-Spont   LIV  2 Gravida      Vag-Spont   LIV  1 SAB             Obstetric Comments  1st Menstrual Cycle:  12  1st Pregnancy: 15    Past Medical History:  Diagnosis Date   Arthritis    Atrial fibrillation (HCC)    Breast cancer (HCC) 04/2018    right breast   Dysrhythmia    afib   Hyperlipidemia    Hypertension    Hypotension due to drugs 05/30/2018   Hypothyroidism    Lumbago    Osteoporosis    Personal history of chemotherapy    Personal history of radiation therapy    Thyroid disease     Past Surgical History:  Procedure Laterality Date   BREAST BIOPSY Right 05/02/2018   Invasive mammary carcinoma, grade 3, neo adj chemo   BREAST LUMPECTOMY Right 10/29/2018   NO RESIDUAL MALIGNANCY IDENTIFIED.    BREAST LUMPECTOMY WITH NEEDLE LOCALIZATION AND AXILLARY SENTINEL LYMPH NODE BX Right 10/29/2018   Procedure: BREAST LUMPECTOMY WITH NEEDLE LOCALIZATION AND SENTINEL LYMPH NODE BX;  Surgeon: Ancil Linsey, MD;  Location: [facility];  Service: General;  Laterality: Right;   CATARACT EXTRACTION Bilateral    JOINT REPLACEMENT Left    tkr   PORTACATH PLACEMENT Right 05/11/2018   Procedure: INSERTION  I.J PORT-A-CATH;  Surgeon: Earline Mayotte, MD;  Location: [facility];  Service: General;  Laterality: Right;   REPLACEMENT TOTAL KNEE        SOCIAL HISTORY:  Social History   Tobacco Use  Smoking Status Never  Smokeless Tobacco Never   Social History   Substance and Sexual Activity  Alcohol Use No    Social History   Substance and Sexual Activity  Drug Use No    Family History  Problem Relation Age of Onset   Diabetes Mother  Congestive Heart Failure Mother    Breast cancer Maternal Grandmother        dx 77s; deceased 75s   Cancer Maternal Uncle 72       deceased 47s   Other Father        No information on father or paternal relatives   Breast cancer Other 36       paternal half-sister; also esophageal ca at 62; currently 34   Diabetes Other    Hypertension Other    Kidney disease Other    Thyroid disease Other     ALLERGIES:  Gabapentin, Morphine and codeine, Peanut-containing drug products, and Tape  MEDS:   Current Outpatient Medications on File Prior to Visit  Medication Sig Dispense  Refill   acetaminophen (TYLENOL) 500 MG tablet Take 500 mg by mouth every 6 (six) hours as needed.     allopurinol (ZYLOPRIM) 300 MG tablet Take 1 tablet (300 mg total) by mouth daily. 90 tablet 1   amLODipine (NORVASC) 2.5 MG tablet Take 0.5 tablets (1.25 mg total) by mouth daily. 15 tablet 2   apixaban (ELIQUIS) 5 MG TABS tablet Take 5 mg by mouth in the morning and at bedtime.     EPINEPHrine 0.3 mg/0.3 mL IJ SOAJ injection Inject 0.3 mg into the muscle as needed for anaphylaxis. 1 each 12   flecainide (TAMBOCOR) 100 MG tablet Take 100 mg by mouth 2 (two) times daily.     levothyroxine (SYNTHROID) 200 MCG tablet Take 1 tablet (200 mcg total) by mouth daily before breakfast. 90 tablet 3   lisinopril-hydrochlorothiazide (ZESTORETIC) 20-25 MG tablet Take 2 tablets by mouth daily. 180 tablet 1   metoprolol succinate (TOPROL-XL) 50 MG 24 hr tablet Take with or immediately following a meal. 90 tablet 1   No current facility-administered medications on file prior to visit.    No orders of the defined types were placed in this encounter.    Physical examination BP (!) 151/71   Pulse 70   Ht 5\' 2"  (1.575 m)   Wt (!) 309 lb 3.2 oz (140.3 kg)   LMP 03/17/2009 (Approximate)   BMI 56.55 kg/m   General NAD, Conversant  HEENT Atraumatic; Op clear with mmm.  Normo-cephalic.  Anicteric sclerae  Thyroid/Neck Smooth without nodularity or enlargement. Normal ROM.  Neck Supple.  Skin No rashes, lesions or ulceration. Normal palpated skin turgor. No nodularity.  Breasts: No masses or discharge.  Symmetric.  No axillary adenopathy.  Lungs: Clear to auscultation.No rales or wheezes. Normal Respiratory effort, no retractions.  Heart: NSR.  No murmurs or rubs appreciated. No peripheral edema  Abdomen: Soft.  Non-tender.  No masses.  No HSM. No hernia  Extremities: Moves all appropriately.  Normal ROM for age. No lymphadenopathy.  Neuro: Oriented to PPT.  Normal mood. Normal affect.     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 Pre-op exam   2. Endometrial intraepithelial neoplasia (EIN)   3. Endometrial polyp    She has elected to undergo D&C and IUD placement because of postmenopausal bleeding and EIN within an endometrial polyp.  Patient not a good surgical candidate for extensive surgery like hysterectomy.   Plan:   Orders: No orders of the defined types were placed in this encounter.    1.  Hysteroscopy, D&C, Mirena IUD placement

## 2023-11-01 NOTE — H&P (View-Only) (Signed)
 PRE-OPERATIVE HISTORY AND PHYSICAL EXAM  PCP:  Dorcas Carrow, DO Subjective:   HPI:  Wendy Marsh is a 68 y.o. [phone].  Patient's last menstrual period was 03/17/2009 (approximate).  She presents today for a pre-op discussion and PE.  She has the following symptoms:  EIN - Polyp  Review of Systems:   Constitutional: Denied constitutional symptoms, night sweats, recent illness, fatigue, fever, insomnia and weight loss.  Eyes: Denied eye symptoms, eye pain, photophobia, vision change and visual disturbance.  Ears/Nose/Throat/Neck: Denied ear, nose, throat or neck symptoms, hearing loss, nasal discharge, sinus congestion and sore throat.  Cardiovascular: Denied cardiovascular symptoms, arrhythmia, chest pain/pressure, edema, exercise intolerance, orthopnea and palpitations.  Respiratory: Denied pulmonary symptoms, asthma, pleuritic pain, productive sputum, cough, dyspnea and wheezing.  Gastrointestinal: Denied, gastro-esophageal reflux, melena, nausea and vomiting.  Genitourinary: See HPI for additional information.  See  Musculoskeletal: Denied musculoskeletal symptoms, stiffness, swelling, muscle weakness and myalgia.  Dermatologic: Denied dermatology symptoms, rash and scar.  Neurologic: Denied neurology symptoms, dizziness, headache, neck pain and syncope.  Psychiatric: Denied psychiatric symptoms, anxiety and depression.  Endocrine: Denied endocrine symptoms including hot flashes and night sweats.   OB History  Gravida Para Term Preterm AB Living  4    1 3   SAB IAB Ectopic Multiple Live Births  1    3    # Outcome Date GA Lbr Len/2nd Weight Sex Type Anes PTL Lv  4 Gravida      Vag-Spont   LIV  3 Gravida      Vag-Spont   LIV  2 Gravida      Vag-Spont   LIV  1 SAB             Obstetric Comments  1st Menstrual Cycle:  12  1st Pregnancy: 15    Past Medical History:  Diagnosis Date   Arthritis    Atrial fibrillation (HCC)    Breast cancer (HCC) 04/2018    right breast   Dysrhythmia    afib   Hyperlipidemia    Hypertension    Hypotension due to drugs 05/30/2018   Hypothyroidism    Lumbago    Osteoporosis    Personal history of chemotherapy    Personal history of radiation therapy    Thyroid disease     Past Surgical History:  Procedure Laterality Date   BREAST BIOPSY Right 05/02/2018   Invasive mammary carcinoma, grade 3, neo adj chemo   BREAST LUMPECTOMY Right 10/29/2018   NO RESIDUAL MALIGNANCY IDENTIFIED.    BREAST LUMPECTOMY WITH NEEDLE LOCALIZATION AND AXILLARY SENTINEL LYMPH NODE BX Right 10/29/2018   Procedure: BREAST LUMPECTOMY WITH NEEDLE LOCALIZATION AND SENTINEL LYMPH NODE BX;  Surgeon: Ancil Linsey, MD;  Location: [facility];  Service: General;  Laterality: Right;   CATARACT EXTRACTION Bilateral    JOINT REPLACEMENT Left    tkr   PORTACATH PLACEMENT Right 05/11/2018   Procedure: INSERTION  I.J PORT-A-CATH;  Surgeon: Earline Mayotte, MD;  Location: [facility];  Service: General;  Laterality: Right;   REPLACEMENT TOTAL KNEE        SOCIAL HISTORY:  Social History   Tobacco Use  Smoking Status Never  Smokeless Tobacco Never   Social History   Substance and Sexual Activity  Alcohol Use No    Social History   Substance and Sexual Activity  Drug Use No    Family History  Problem Relation Age of Onset   Diabetes Mother  Congestive Heart Failure Mother    Breast cancer Maternal Grandmother        dx 77s; deceased 75s   Cancer Maternal Uncle 72       deceased 47s   Other Father        No information on father or paternal relatives   Breast cancer Other 36       paternal half-sister; also esophageal ca at 62; currently 34   Diabetes Other    Hypertension Other    Kidney disease Other    Thyroid disease Other     ALLERGIES:  Gabapentin, Morphine and codeine, Peanut-containing drug products, and Tape  MEDS:   Current Outpatient Medications on File Prior to Visit  Medication Sig Dispense  Refill   acetaminophen (TYLENOL) 500 MG tablet Take 500 mg by mouth every 6 (six) hours as needed.     allopurinol (ZYLOPRIM) 300 MG tablet Take 1 tablet (300 mg total) by mouth daily. 90 tablet 1   amLODipine (NORVASC) 2.5 MG tablet Take 0.5 tablets (1.25 mg total) by mouth daily. 15 tablet 2   apixaban (ELIQUIS) 5 MG TABS tablet Take 5 mg by mouth in the morning and at bedtime.     EPINEPHrine 0.3 mg/0.3 mL IJ SOAJ injection Inject 0.3 mg into the muscle as needed for anaphylaxis. 1 each 12   flecainide (TAMBOCOR) 100 MG tablet Take 100 mg by mouth 2 (two) times daily.     levothyroxine (SYNTHROID) 200 MCG tablet Take 1 tablet (200 mcg total) by mouth daily before breakfast. 90 tablet 3   lisinopril-hydrochlorothiazide (ZESTORETIC) 20-25 MG tablet Take 2 tablets by mouth daily. 180 tablet 1   metoprolol succinate (TOPROL-XL) 50 MG 24 hr tablet Take with or immediately following a meal. 90 tablet 1   No current facility-administered medications on file prior to visit.    No orders of the defined types were placed in this encounter.    Physical examination BP (!) 151/71   Pulse 70   Ht 5\' 2"  (1.575 m)   Wt (!) 309 lb 3.2 oz (140.3 kg)   LMP 03/17/2009 (Approximate)   BMI 56.55 kg/m   General NAD, Conversant  HEENT Atraumatic; Op clear with mmm.  Normo-cephalic.  Anicteric sclerae  Thyroid/Neck Smooth without nodularity or enlargement. Normal ROM.  Neck Supple.  Skin No rashes, lesions or ulceration. Normal palpated skin turgor. No nodularity.  Breasts: No masses or discharge.  Symmetric.  No axillary adenopathy.  Lungs: Clear to auscultation.No rales or wheezes. Normal Respiratory effort, no retractions.  Heart: NSR.  No murmurs or rubs appreciated. No peripheral edema  Abdomen: Soft.  Non-tender.  No masses.  No HSM. No hernia  Extremities: Moves all appropriately.  Normal ROM for age. No lymphadenopathy.  Neuro: Oriented to PPT.  Normal mood. Normal affect.     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 Pre-op exam   2. Endometrial intraepithelial neoplasia (EIN)   3. Endometrial polyp    She has elected to undergo D&C and IUD placement because of postmenopausal bleeding and EIN within an endometrial polyp.  Patient not a good surgical candidate for extensive surgery like hysterectomy.   Plan:   Orders: No orders of the defined types were placed in this encounter.    1.  Hysteroscopy, D&C, Mirena IUD placement

## 2023-11-15 ENCOUNTER — Inpatient Hospital Stay: Payer: Medicare Other | Attending: Oncology

## 2023-11-15 ENCOUNTER — Encounter: Payer: Self-pay | Admitting: Oncology

## 2023-11-15 ENCOUNTER — Inpatient Hospital Stay (HOSPITAL_BASED_OUTPATIENT_CLINIC_OR_DEPARTMENT_OTHER): Payer: Medicare Other | Admitting: Oncology

## 2023-11-15 VITALS — BP 158/62 | HR 72 | Temp 97.2°F | Resp 18 | Wt 306.0 lb

## 2023-11-15 DIAGNOSIS — Z9221 Personal history of antineoplastic chemotherapy: Secondary | ICD-10-CM | POA: Insufficient documentation

## 2023-11-15 DIAGNOSIS — C50511 Malignant neoplasm of lower-outer quadrant of right female breast: Secondary | ICD-10-CM

## 2023-11-15 DIAGNOSIS — Z171 Estrogen receptor negative status [ER-]: Secondary | ICD-10-CM

## 2023-11-15 DIAGNOSIS — E876 Hypokalemia: Secondary | ICD-10-CM | POA: Diagnosis not present

## 2023-11-15 DIAGNOSIS — Z853 Personal history of malignant neoplasm of breast: Secondary | ICD-10-CM | POA: Insufficient documentation

## 2023-11-15 DIAGNOSIS — Z1231 Encounter for screening mammogram for malignant neoplasm of breast: Secondary | ICD-10-CM | POA: Diagnosis not present

## 2023-11-15 LAB — CMP (CANCER CENTER ONLY)
ALT: 21 U/L (ref 0–44)
AST: 24 U/L (ref 15–41)
Albumin: 3.4 g/dL — ABNORMAL LOW (ref 3.5–5.0)
Alkaline Phosphatase: 79 U/L (ref 38–126)
Anion gap: 13 (ref 5–15)
BUN: 21 mg/dL (ref 8–23)
CO2: 26 mmol/L (ref 22–32)
Calcium: 9 mg/dL (ref 8.9–10.3)
Chloride: 97 mmol/L — ABNORMAL LOW (ref 98–111)
Creatinine: 0.87 mg/dL (ref 0.44–1.00)
GFR, Estimated: >60 mL/min (ref >60)
Glucose, Bld: 93 mg/dL (ref 70–99)
Potassium: 3.4 mmol/L — ABNORMAL LOW (ref 3.5–5.1)
Sodium: 136 mmol/L (ref 135–145)
Total Bilirubin: 0.6 mg/dL (ref 0.0–1.2)
Total Protein: 7.5 g/dL (ref 6.5–8.1)

## 2023-11-15 LAB — CBC WITH DIFFERENTIAL (CANCER CENTER ONLY)
Abs Immature Granulocytes: 0.07 10*3/uL (ref 0.00–0.07)
Basophils Absolute: 0.1 10*3/uL (ref 0.0–0.1)
Basophils Relative: 1 %
Eosinophils Absolute: 0.2 10*3/uL (ref 0.0–0.5)
Eosinophils Relative: 2 %
HCT: 38.8 % (ref 36.0–46.0)
Hemoglobin: 12 g/dL (ref 12.0–15.0)
Immature Granulocytes: 1 %
Lymphocytes Relative: 16 %
Lymphs Abs: 1.8 10*3/uL (ref 0.7–4.0)
MCH: 26.9 pg (ref 26.0–34.0)
MCHC: 30.9 g/dL (ref 30.0–36.0)
MCV: 87 fL (ref 80.0–100.0)
Monocytes Absolute: 0.5 10*3/uL (ref 0.1–1.0)
Monocytes Relative: 5 %
Neutro Abs: 8.3 10*3/uL — ABNORMAL HIGH (ref 1.7–7.7)
Neutrophils Relative %: 75 %
Platelet Count: 329 10*3/uL (ref 150–400)
RBC: 4.46 MIL/uL (ref 3.87–5.11)
RDW: 15.9 % — ABNORMAL HIGH (ref 11.5–15.5)
WBC Count: 10.9 10*3/uL — ABNORMAL HIGH (ref 4.0–10.5)
nRBC: 0 % (ref 0.0–0.2)

## 2023-11-15 NOTE — Progress Notes (Signed)
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  ASSESSMENT & PLAN:   Cancer Staging  Malignant neoplasm of lower-outer quadrant of right female breast [facility]) Staging form: Breast, AJCC 8th Edition - Clinical stage from 05/08/2018: Stage IIB (cT2, cN0, cM0, G3, ER-, PR-, HER2-) - Signed by Rickard Patience, MD on 05/08/2018 - Pathologic stage from 11/14/2018: No Stage Recommended (ypT0, pN0, cM0, ER-, PR-, HER2-) - Signed by Rickard Patience, MD on 11/14/2018   Malignant neoplasm of lower-outer quadrant of right female breast [facility]) #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  s/p right lumpectomy with excisional biopsy of sentinel lymph node. S/p radiation.  Patient achieved complete pathological remission,  ypT0 ypN0 Clinically patient is doing very well.  She is 5 years after surgery [10/29/2018]. Labs were reviewed and discussed with patient.   Tumor markers are pending.   Continue observation and imaging surveillance- diagnostic mammogram in August 2025   Hypokalemia Recommend potassium rich food intake.    Orders Placed This Encounter  Procedures   MM 3D SCREENING MAMMOGRAM BILATERAL BREAST    Standing Status:   Future    Expected Date:   05/14/2024    Expiration Date:   11/14/2024    Reason for Exam (SYMPTOM  OR DIAGNOSIS REQUIRED):   hx breast cancer    Preferred imaging location?:    [facility]   Cancer antigen 15-3    Standing Status:   Future    Expected Date:   11/14/2024    Expiration Date:   11/14/2024   Cancer antigen 27.29    Standing Status:   Future    Expected Date:   11/14/2024    Expiration Date:   11/14/2024   CBC with Differential (Cancer Center Only)    Standing Status:   Future    Expected Date:   11/14/2024    Expiration Date:   11/14/2024   CMP (Cancer Center only)    Standing Status:    Future    Expected Date:   11/14/2024    Expiration Date:   11/14/2024   Follow up in 12 months.  All questions were answered. The patient knows to call the clinic with any problems, questions or concerns.  Rickard Patience, MD, PhD [facility] Hematology Oncology 11/15/2023    HISTORY OF PRESENTING ILLNESS:  Wendy Marsh is a  68 y.o.  female follow-up of breast cancer.  04/28/2019 diagnostic mammogram Mammogram showed right breast 6 o'clock axis breast mass, 2.9 cm corresponding to area of concern.  cT2N0  Breast mass was biopsied, and pathology showed invasive mammary carcinoma.  Grade 3 , ER/PR- HER2 -.  LVI suspicious, favor present.   #  baseline MUGA testing done and reviewed systolic function depression, LVEF 45%. Given that Adriamycin has cardiac toxicity, potentially can lead to irreversible cardiomyopathy, I discussed with patient about using non-anthracycline-containing regimen. # 05/22/2018- 09/11/2018  S/p 6 cycle of Docetaxel 75mg /m2 and Carboplatin every 3 weeks [ data from PROGECT study Judi Cong et al 2018 Clin Cancer Res, Pathological Response and Survival in Triple-Negative Breast Cancer Following Neoadjuvant Carboplatin plus Docetaxel.Stage I-III TNBC patients, Neoadjuvant carboplatin (AUC6) plus docetaxel (75 mg/m2) every 21 days  6 cycles. pCR was 55% and Residual cancer burden (RCB) were 13%. Excellent Three-year RFS was 90% in patients with pCR and 66% in those without pCR]    # 10/29/2018  right breast lumpectomy with  excisional biopsy of the right axillary deep sentinel lymph nodes. Pathology showed CR  ypT0, ypN0- complete pathological remission.   # # Family history of breast cancer plus personal history of triple negative breast cancer. Testing did not reveal a pathogenic mutation in any of the genes analyzed.   BRCA negative.  VUS was detected. Does not change management.   #Mediport was removed. 05/05/2022, bilateral diagnostic mammogram showed stable  appearance of the left breast mass for greater than 2 years. Consistent with a benign etiology. No mammographic evidence malignancy bilaterally.   Chronic "visual and auditory hallucinations"  She reports seeing "a rectangular shape box" in front of her which she later discovered it was her books shelf. She attributes to her cataract for the false depth perception. She hears people talking in another room,  PCP was aware about this and has ordered MRI brain was negative for malignancy or other acute process.   INTERVAL HISTORY Wendy Marsh is a 68 y.o. female who has above history reviewed by me today presents for discussion of pathology and further management plan for triple negative breast cancer. Patient reports feeling well today.  No new complaints. She denies any breast concerns.  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  She has upcoming D&C and IUD placement procedure with Dr. Logan Bores.    .. Review of Systems  Constitutional:  Negative for chills, fever, malaise/fatigue and weight loss.  HENT:  Negative for nosebleeds and sore throat.   Eyes:  Negative for double vision, photophobia and redness.  Respiratory:  Negative for cough, shortness of breath and wheezing.   Cardiovascular:  Negative for chest pain, palpitations, orthopnea and leg swelling.  Gastrointestinal:  Negative for abdominal pain, blood in stool, nausea and vomiting.  Genitourinary:  Negative for dysuria.  Musculoskeletal:  Positive for back pain and joint pain. Negative for myalgias and neck pain.       Chronic right knee pain.  Skin:  Negative for itching and rash.  Neurological:  Negative for dizziness, tingling and tremors.  Endo/Heme/Allergies:  Negative for environmental allergies. Does not bruise/bleed easily.  Psychiatric/Behavioral:  Negative for depression and hallucinations.     MEDICAL HISTORY:  Past Medical  History:  Diagnosis Date   Arthritis    Atrial fibrillation (HCC)    Breast cancer (HCC) 04/2018   right breast   Dysrhythmia    afib   Hyperlipidemia    Hypertension    Hypotension due to drugs 05/30/2018   Hypothyroidism    Lumbago    Osteoporosis    Personal history of chemotherapy    Personal history of radiation therapy    Thyroid disease     SURGICAL HISTORY: Past Surgical History:  Procedure Laterality Date   BREAST BIOPSY Right 05/02/2018   Invasive mammary carcinoma, grade 3, neo adj chemo   BREAST LUMPECTOMY Right 10/29/2018   NO RESIDUAL MALIGNANCY IDENTIFIED.    BREAST LUMPECTOMY WITH NEEDLE LOCALIZATION AND AXILLARY SENTINEL LYMPH NODE BX Right 10/29/2018   Procedure: BREAST LUMPECTOMY WITH NEEDLE LOCALIZATION AND SENTINEL LYMPH NODE BX;  Surgeon: Ancil Linsey, MD;  Location: [facility] ORS;  Service: General;  Laterality: Right;   CATARACT EXTRACTION Bilateral    JOINT REPLACEMENT Left    tkr   PORTACATH PLACEMENT Right 05/11/2018   Procedure: INSERTION  I.J PORT-A-CATH;  Surgeon: Earline Mayotte, MD;  Location: [facility] ORS;  Service: General;  Laterality: Right;  REPLACEMENT TOTAL KNEE      SOCIAL HISTORY: Social History   Socioeconomic History   Marital status: Married    Spouse name: jerry   Number of children: 3   Years of education: Not on file   Highest education level: GED or equivalent  Occupational History   Not on file  Tobacco Use   Smoking status: Never   Smokeless tobacco: Never  Vaping Use   Vaping status: Never Used  Substance and Sexual Activity   Alcohol use: No   Drug use: No   Sexual activity: Not Currently    Birth control/protection: Post-menopausal  Other Topics Concern   Not on file  Social History Narrative   Not on file   Social Drivers of Health   Financial Resource Strain: Low Risk  (10/20/2023)   Overall Financial Resource Strain (CARDIA)    Difficulty of Paying Living Expenses: Not very hard  Food Insecurity:  No Food Insecurity (10/20/2023)   Hunger Vital Sign    Worried About Running Out of Food in the Last Year: Never true    Ran Out of Food in the Last Year: Never true  Transportation Needs: No Transportation Needs (10/20/2023)   PRAPARE - Administrator, Civil Service (Medical): No    Lack of Transportation (Non-Medical): No  Physical Activity: Unknown (10/20/2023)   Exercise Vital Sign    Days of Exercise per Week: 0 days    Minutes of Exercise per Session: Not on file  Stress: No Stress Concern Present (10/20/2023)   [employer] of Occupational Health - Occupational Stress Questionnaire    Feeling of Stress : Only a little  Social Connections: Socially Isolated (10/20/2023)   Social Connection and Isolation Panel [NHANES]    Frequency of Communication with Friends and Family: Once a week    Frequency of Social Gatherings with Friends and Family: Once a week    Attends Religious Services: Never    Database administrator or Organizations: No    Attends Engineer, structural: Not on file    Marital Status: Married  Catering manager Violence: Not At Risk (06/30/2022)   Humiliation, Afraid, Rape, and Kick questionnaire    Fear of Current or Ex-Partner: No    Emotionally Abused: No    Physically Abused: No    Sexually Abused: No    FAMILY HISTORY: Family History  Problem Relation Age of Onset   Diabetes Mother    Congestive Heart Failure Mother    Breast cancer Maternal Grandmother        dx 4s; deceased 44s   Cancer Maternal Uncle 57       deceased 67s   Other Father        No information on father or paternal relatives   Breast cancer Other 73       paternal half-sister; also esophageal ca at 46; currently 72   Diabetes Other    Hypertension Other    Kidney disease Other    Thyroid disease Other     ALLERGIES:  is allergic to gabapentin, morphine and codeine, peanut-containing drug products, and tape.  MEDICATIONS:  Current Outpatient Medications   Medication Sig Dispense Refill   acetaminophen (TYLENOL) 500 MG tablet Take 500-1,000 mg by mouth every 6 (six) hours as needed for moderate pain (pain score 4-6).     allopurinol (ZYLOPRIM) 300 MG tablet Take 1 tablet (300 mg total) by mouth daily. 90 tablet 1   amLODipine (NORVASC) 2.5 MG  tablet Take 0.5 tablets (1.25 mg total) by mouth daily. 15 tablet 2   apixaban (ELIQUIS) 5 MG TABS tablet Take 5 mg by mouth in the morning and at bedtime.     flecainide (TAMBOCOR) 100 MG tablet Take 100 mg by mouth 2 (two) times daily.     levothyroxine (SYNTHROID) 200 MCG tablet Take 1 tablet (200 mcg total) by mouth daily before breakfast. 90 tablet 3   lisinopril-hydrochlorothiazide (ZESTORETIC) 20-25 MG tablet Take 2 tablets by mouth daily. 180 tablet 1   metoprolol succinate (TOPROL-XL) 50 MG 24 hr tablet Take with or immediately following a meal. 90 tablet 1   EPINEPHrine 0.3 mg/0.3 mL IJ SOAJ injection Inject 0.3 mg into the muscle as needed for anaphylaxis. (Patient not taking: Reported on 11/15/2023) 1 each 12   No current facility-administered medications for this visit.     PHYSICAL EXAMINATION: ECOG PERFORMANCE STATUS: 1 - Symptomatic but completely ambulatory Vitals:   11/15/23 1326  BP: (!) 158/62  Pulse: 72  Resp: 18  Temp: (!) 97.2 F (36.2 C)   Filed Weights   11/15/23 1326  Weight: (!) 306 lb (138.8 kg)    Physical Exam Constitutional:      General: She is not in acute distress.    Appearance: She is obese.     Comments: Obese, walks with a walker  HENT:     Head: Normocephalic and atraumatic.  Eyes:     General: No scleral icterus.    Pupils: Pupils are equal, round, and reactive to light.  Cardiovascular:     Rate and Rhythm: Normal rate and regular rhythm.     Heart sounds: Normal heart sounds.  Pulmonary:     Effort: Pulmonary effort is normal. No respiratory distress.     Breath sounds: No wheezing.  Abdominal:     General: Bowel sounds are normal. There is  no distension.     Palpations: Abdomen is soft. There is no mass.     Tenderness: There is no abdominal tenderness.  Musculoskeletal:        General: No deformity. Normal range of motion.     Cervical back: Normal range of motion.     Comments: Chronic lower extremity edema.  1+  Skin:    General: Skin is warm and dry.     Findings: No erythema or rash.  Neurological:     Mental Status: She is alert and oriented to person, place, and time. Mental status is at baseline.     Cranial Nerves: No cranial nerve deficit.     Coordination: Coordination normal.  Psychiatric:        Mood and Affect: Mood normal.      LABORATORY DATA:  I have reviewed the data as listed    Latest Ref Rng & Units 11/15/2023    1:07 PM 09/05/2023    9:25 AM 05/15/2023    1:04 PM  CBC  WBC 4.0 - 10.5 K/uL 10.9  9.0  9.7   Hemoglobin 12.0 - 15.0 g/dL 16.1  09.6  04.5   Hematocrit 36.0 - 46.0 % 38.8  37.9  40.4   Platelets 150 - 400 K/uL 329  318  307       Latest Ref Rng & Units 11/15/2023    1:07 PM 09/05/2023    9:25 AM 05/15/2023    1:04 PM  CMP  Glucose 70 - 99 mg/dL 93  98  82   BUN 8 - 23 mg/dL 21  14  17   Creatinine 0.44 - 1.00 mg/dL 1.61  0.96  0.45   Sodium 135 - 145 mmol/L 136  141  137   Potassium 3.5 - 5.1 mmol/L 3.4  3.7  3.5   Chloride 98 - 111 mmol/L 97  98  95   CO2 22 - 32 mmol/L 26  27  29    Calcium 8.9 - 10.3 mg/dL 9.0  9.4  9.2   Total Protein 6.5 - 8.1 g/dL 7.5  6.6  7.9   Total Bilirubin 0.0 - 1.2 mg/dL 0.6  0.3  0.4   Alkaline Phos 38 - 126 U/L 79  112  79   AST 15 - 41 U/L 24  15  16    ALT 0 - 44 U/L 21  15  15      RADIOGRAPHIC STUDIES: I have personally reviewed the radiological images as listed and agreed with the findings in the report. NM Cardiac Muga rest: Calculated LEFT ventricular ejection fraction equals 45%

## 2023-11-15 NOTE — Assessment & Plan Note (Addendum)
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  s/p right lumpectomy with excisional biopsy of sentinel lymph node. S/p radiation.  Patient achieved complete pathological remission,  ypT0 ypN0 Clinically patient is doing very well.  She is 5 years after surgery [10/29/2018]. Labs were reviewed and discussed with patient.   Tumor markers are pending.   Continue observation and imaging surveillance- diagnostic mammogram in August 2025

## 2023-11-15 NOTE — Progress Notes (Signed)
Pt here for followup. Pt will have D&C and IUD insertion on 2/24.

## 2023-11-15 NOTE — Assessment & Plan Note (Signed)
Recommend potassium rich food intake.

## 2023-11-16 LAB — CANCER ANTIGEN 27.29: CA 27.29: 14.6 U/mL (ref 0.0–38.6)

## 2023-11-17 LAB — CANCER ANTIGEN 15-3: CA 15-3: 13.1 U/mL (ref 0.0–25.0)

## 2023-11-20 ENCOUNTER — Encounter
Admission: RE | Admit: 2023-11-20 | Discharge: 2023-11-20 | Disposition: A | Discharge status: Home / Self Care | Payer: Medicare Other | Source: Ambulatory Visit | Attending: Obstetrics and Gynecology | Admitting: Obstetrics and Gynecology

## 2023-11-20 ENCOUNTER — Other Ambulatory Visit: Payer: Self-pay

## 2023-11-20 HISTORY — DX: Malignant neoplasm of lower-outer quadrant of right female breast: C50.511

## 2023-11-20 HISTORY — DX: Nonrheumatic mitral (valve) insufficiency: I34.0

## 2023-11-20 HISTORY — DX: Other persistent atrial fibrillation: I48.19

## 2023-11-20 HISTORY — DX: Essential (primary) hypertension: I10

## 2023-11-20 HISTORY — DX: Mixed hyperlipidemia: E78.2

## 2023-11-20 HISTORY — DX: Morbid (severe) obesity due to excess calories: E66.01

## 2023-11-20 HISTORY — DX: Family history of other specified conditions: Z84.89

## 2023-11-20 NOTE — Patient Instructions (Addendum)
 Your procedure is scheduled on: Monday, February 24 Report to the Registration Desk on the 1st floor of the [facility] To find out your arrival time, please call [phone] between 1PM - 3PM on: Friday, February 21 If your arrival time is 6:00 am, do not arrive before that time as the [facility] entrance doors do not open until 6:00 am.  REMEMBER: Instructions that are not followed completely may result in serious medical risk, up to and including death; or upon the discretion of your surgeon and anesthesiologist your surgery may need to be rescheduled.  Do not eat or drink after midnight the night before surgery.  No gum chewing or hard candies.  One week prior to surgery: starting February 17 Stop Anti-inflammatories (NSAIDS) such as Advil, Aleve, Ibuprofen, Motrin, Naproxen, Naprosyn and Aspirin based products such as Excedrin, Goody's Powder, BC Powder. Stop ANY OVER THE COUNTER supplements until after surgery.  You may however, continue to take Tylenol if needed for pain up until the day of surgery.  apixaban (ELIQUIS) - hold 2 days before surgery. Last day to take is Friday, February 21. Resume AFTER surgery per surgeon instruction.  Continue taking all of your other prescription medications up until the day of surgery.  ON THE DAY OF SURGERY ONLY TAKE THESE MEDICATIONS WITH SIPS OF WATER:  amLODipine flecainide  levothyroxine  metoprolol succinate   No Alcohol for 24 hours before or after surgery.  No Smoking including e-cigarettes for 24 hours before surgery.  No chewable tobacco products for at least 6 hours before surgery.  No nicotine patches on the day of surgery.  Do not use any "recreational" drugs for at least a week (preferably 2 weeks) before your surgery.  Please be advised that the combination of cocaine and anesthesia may have negative outcomes, up to and including death. If you test positive for cocaine, your surgery will be cancelled.  On the  morning of surgery brush your teeth with toothpaste and water, you may rinse your mouth with mouthwash if you wish. Do not swallow any toothpaste or mouthwash.  Do not wear jewelry, make-up, hairpins, clips or nail polish.  For welded (permanent) jewelry: bracelets, anklets, waist bands, etc.  Please have this removed prior to surgery.  If it is not removed, there is a chance that hospital personnel will need to cut it off on the day of surgery.  Do not wear lotions, powders, or perfumes.   Do not shave body hair from the neck down 48 hours before surgery.  Contact lenses, hearing aids and dentures may not be worn into surgery.  Do not bring valuables to the hospital. [facility] is not responsible for any missing/lost belongings or valuables.   Notify your doctor if there is any change in your medical condition (cold, fever, infection).  Wear comfortable clothing (specific to your surgery type) to the hospital.  After surgery, you can help prevent lung complications by doing breathing exercises.  Take deep breaths and cough every 1-2 hours.   If you are being discharged the day of surgery, you will not be allowed to drive home. You will need a responsible individual to drive you home and stay with you for 24 hours after surgery.   If you are taking public transportation, you will need to have a responsible individual with you.  Please call the Pre-admissions Testing Dept. at [phone] if you have any questions about these instructions.  Surgery Visitation Policy:  Patients having surgery or  a procedure may have two visitors.  Children under the age of 70 must have an adult with them who is not the patient.  Temporary Visitor Restrictions Due to increasing cases of flu, RSV and COVID-19: Children ages 71 and under will not be able to visit patients in [facility] hospitals under most circumstances.

## 2023-11-21 ENCOUNTER — Encounter: Payer: Self-pay | Admitting: Urgent Care

## 2023-11-21 ENCOUNTER — Encounter
Admission: RE | Admit: 2023-11-21 | Discharge: 2023-11-21 | Disposition: A | Discharge status: Home / Self Care | Payer: Medicare Other | Source: Ambulatory Visit | Attending: Obstetrics and Gynecology | Admitting: Obstetrics and Gynecology

## 2023-11-21 DIAGNOSIS — Z01812 Encounter for preprocedural laboratory examination: Secondary | ICD-10-CM | POA: Diagnosis present

## 2023-11-21 DIAGNOSIS — N8502 Endometrial intraepithelial neoplasia [EIN]: Secondary | ICD-10-CM

## 2023-11-21 DIAGNOSIS — N840 Polyp of corpus uteri: Secondary | ICD-10-CM

## 2023-11-21 DIAGNOSIS — Z01818 Encounter for other preprocedural examination: Secondary | ICD-10-CM

## 2023-11-21 LAB — TYPE AND SCREEN
ABO/RH(D): A POS
Antibody Screen: NEGATIVE

## 2023-11-24 ENCOUNTER — Encounter: Payer: Self-pay | Admitting: Obstetrics and Gynecology

## 2023-11-24 NOTE — Progress Notes (Signed)
 Perioperative / Anesthesia Services  Pre-Admission Testing Clinical Review / Pre-Operative Anesthesia Consult  Date: 11/24/23  Patient Demographics:  Name: Wendy Marsh DOB: [birth year] MRN:   [id number]  Planned Surgical Procedure(s):    Case: [id number] Date/Time: 11/27/23 1312   Procedures:      HYSTEROSCOPY D&C, IUD PLACEMENT     INTRAUTERINE DEVICE (IUD) PLACEMENT   Anesthesia type: Choice   Pre-op diagnosis: EIN, endometrial polyp   Location: [facility] OR ROOM 05 / [facility]   Surgeons: Linzie Collin, MD      NOTE: Available PAT nursing documentation and vital signs have been reviewed. Clinical nursing staff has updated patient's PMH/PSHx, current medication list, and drug allergies/intolerances to ensure comprehensive history available to assist in medical decision making as it pertains to the aforementioned surgical procedure and anticipated anesthetic course. Extensive review of available clinical information personally performed. [place] PMH and PSHx updated with any diagnoses/procedures that  may have been inadvertently omitted during his intake with the pre-admission testing department's nursing staff.  Clinical Discussion:  Wendy Marsh is a 68 y.o. female who is submitted for pre-surgical anesthesia review and clearance prior to her undergoing the above procedure. Patient has never been a smoker in the past. Pertinent PMH includes: atrial fibrillation, HTN. HLD, hypothyroidism, DOE, invasive OA, chronic lower back pain, endometrial polyp.   Patient is followed by cardiology Darrold Junker, MD). She was last seen in the cardiology clinic on 07/21/2023; notes reviewed. At the time of her clinic visit, patient doing well overall from a cardiovascular perspective. Patient with chronic exertional dyspnea that is reported to be stable and at baseline. She also has chronic peripheral edema that resolves with extremity elevation. Patient denied any chest  pain, PND, orthopnea, palpitations, weakness, fatigue, vertiginous symptoms, or presyncope/syncope. Patient with a past medical history significant for cardiovascular diagnoses. Documented physical exam was grossly benign, providing no evidence of acute exacerbation and/or decompensation of the patient's known cardiovascular conditions.  TTE performed on 08/04/2023 revealed a normal left ventricular systolic function with an EF of >55%. There was no LVH. There were no regional wall motion abnormalities. Right ventricular size and function normal.  There was trivial mitral and tricuspid valve regurgitation. All transvalvular gradients were noted to be normal providing no evidence suggestive of valvular stenosis. Aorta normal in size with no evidence of ectasia or aneurysmal dilatation.  Myocardial perfusion imaging study was performed on 08/11/2023 revealing a normal left ventricular systolic function with a hyperdynamic EF of 67%.  There were no regional wall motion abnormalities.  No artifact or left ventricular cavity size enlargement appreciated on review of imaging. SPECT images demonstrated no evidence of stress-induced myocardial ischemia or arrhythmia; no scintigraphic evidence of scar.  Study determined to be normal and low risk.  Patient with an atrial fibrillation diagnosis; CHA2DS2-VASc Score = 3 (age, sex, HTN). Her rate and rhythm are currently being maintained on oral flecainide + metoprolol succinate. She is chronically anticoagulated using apixaban; reported to be compliant with therapy with no evidence or reports of GI/GU bleeding.  Blood pressure elevated at 150/80 mmHg on currently prescribed CCB (amlodipine), diuretic (HCTZ), ACEi (lisinopril), and beta-clocker (metoprolol succinate) therapies. She is not on any type of lipid lowering therapies for her HLD diagnosis and  ASCVD prevention. Discussed plans for coronary CTA to assess for calcium depositions in the setting of known HLD; last  LDL 137. Patient declined intervention at that time. Patient is not diabetic. Patient does not have  an OSAH diagnosis. Patient sedentary overall. She has no formal exercise regimen. With that said, patient is able to complete all of her ADL/IADLs without cardiovascular limitation.  Per the [facility], patient is able to achieve at least 4 METS of physical activity without experiencing any significant degree of angina/anginal equivalent symptoms. No changes were made to her medication regimen during her visit with cardiology.  Patient scheduled to follow-up with outpatient cardiology in 6 months or sooner if needed.  Wendy Marsh is scheduled for an elective HYSTEROSCOPY D&C, IUD PLACEMENT; INTRAUTERINE DEVICE (IUD) PLACEMENT on 11/27/2023 with Dr. [name], MD. Given patient's past medical history significant for cardiovascular diagnoses, presurgical cardiac clearance was sought by the PAT team. Per cardiology, "this patient is optimized for surgery and may proceed with the planned procedural course with a LOW risk of significant perioperative cardiovascular complications".  Again, this patient is on daily oral anticoagulation therapy using a DOAC medication.  She has been instructed on recommendations for holding her apixaban for 2 days prior to her procedure with plans to restart as soon as postoperative bleeding risk felt to be minimized by his attending surgeon. The patient has been instructed that her last dose of apixaban should be on 11/24/2023.  Patient denies previous perioperative complications with anesthesia in the past. In review her EMR, it is noted that patient underwent a general anesthetic course here at [facility] ([facility]) in 10/2018 without documented complications.      11/20/2023    3:46 PM 11/15/2023    1:26 PM 11/01/2023    2:06 PM  Vitals with BMI  Height 5\' 2"     Weight 306 lbs 306 lbs   BMI 55.95 55.95   Systolic  158 121  Diastolic  62  74  Pulse  72 61   Providers/Specialists:  NOTE: Primary physician provider listed below. Patient may have been seen by APP or partner within same practice.   PROVIDER ROLE / SPECIALTY LAST Ross Ludwig, MD OB/GYN (Surgeon) 11/01/2023  Dorcas Carrow, DO Primary Care Provider 10/24/2023  Marcina Millard, MD Cardiology 07/21/2023  Rickard Patience, MD Medical Oncology 11/15/2023   Allergies:   Allergies  Allergen Reactions   Gabapentin     "makes me feel kinda out of it"   Morphine And Codeine Nausea And Vomiting   Peanut-Containing Drug Products Swelling   Tape Rash   Current Home Medications:   No current facility-administered medications for this encounter.    acetaminophen (TYLENOL) 500 MG tablet   allopurinol (ZYLOPRIM) 300 MG tablet   amLODipine (NORVASC) 2.5 MG tablet   apixaban (ELIQUIS) 5 MG TABS tablet   flecainide (TAMBOCOR) 100 MG tablet   levothyroxine (SYNTHROID) 200 MCG tablet   lisinopril-hydrochlorothiazide (ZESTORETIC) 20-25 MG tablet   metoprolol succinate (TOPROL-XL) 50 MG 24 hr tablet   EPINEPHrine 0.3 mg/0.3 mL IJ SOAJ injection   History:   Past Medical History:  Diagnosis Date   Arthritis    Benign essential hypertension    DOE (dyspnea on exertion)    Endometrial polyp    Family history of adverse reaction to anesthesia    a.) PONV in 1st degree relative (sister)   Gout    History of 2019 novel coronavirus disease (COVID-19) 09/2020   Hypothyroidism    Invasive carcinoma of RIGHT breast (HCC) 04/2018   a.) pathology (+) IMC (T2, N0; G3, ER/PR -, HER2/neu -); Tx'd with neoadjuvant systemic antineoplastic chemotherapy + surgical excision (lumpectomy)  Lumbago    Mixed hyperlipidemia    Morbid obesity with BMI of 50.0-59.9, adult (HCC)    On apixaban therapy    Osteoporosis    Persistent atrial fibrillation (HCC)    a.) CHA2DS2VASc = 3 (age, sex, HTN) as of 11/24/2023; b.) rate/rhythm maintained on oral flecainide + metoprolol  succinate; chronically anticoagulated with apixaban   Personal history of chemotherapy    Personal history of radiation therapy    Past Surgical History:  Procedure Laterality Date   BREAST BIOPSY Right 05/02/2018   Invasive mammary carcinoma, grade 3, neo adj chemo   BREAST LUMPECTOMY Right 10/29/2018   NO RESIDUAL MALIGNANCY IDENTIFIED.    BREAST LUMPECTOMY WITH NEEDLE LOCALIZATION AND AXILLARY SENTINEL LYMPH NODE BX Right 10/29/2018   Procedure: BREAST LUMPECTOMY WITH NEEDLE LOCALIZATION AND SENTINEL LYMPH NODE BX;  Surgeon: Ancil Linsey, MD;  Location: [facility] ORS;  Service: General;  Laterality: Right;   CATARACT EXTRACTION W/ INTRAOCULAR LENS  IMPLANT, BILATERAL Bilateral 2024   PORT-A-CATH REMOVAL  2022   PORTACATH PLACEMENT Right 05/11/2018   Procedure: INSERTION  I.J PORT-A-CATH;  Surgeon: Earline Mayotte, MD;  Location: [facility] ORS;  Service: General;  Laterality: Right;   REPLACEMENT TOTAL KNEE Left 2010   Family History  Problem Relation Age of Onset   Diabetes Mother    Congestive Heart Failure Mother    Breast cancer Maternal Grandmother        dx 31s; deceased 31s   Cancer Maternal Uncle 35       deceased 41s   Other Father        No information on father or paternal relatives   Breast cancer Other 60       paternal half-sister; also esophageal ca at 68; currently 63   Diabetes Other    Hypertension Other    Kidney disease Other    Thyroid disease Other    Social History   Tobacco Use   Smoking status: Never   Smokeless tobacco: Never  Substance Use Topics   Alcohol use: No   Pertinent Clinical Results:  LABS:  Lab Results  Component Value Date   WBC 10.9 (H) 11/15/2023   HGB 12.0 11/15/2023   HCT 38.8 11/15/2023   MCV 87.0 11/15/2023   PLT 329 11/15/2023   Lab Results  Component Value Date   NA 136 11/15/2023   CL 97 (L) 11/15/2023   K 3.4 (L) 11/15/2023   CO2 26 11/15/2023   BUN 21 11/15/2023   CREATININE 0.87 11/15/2023   GFRNONAA >60  11/15/2023   CALCIUM 9.0 11/15/2023   ALBUMIN 3.4 (L) 11/15/2023   GLUCOSE 93 11/15/2023    ECG: Date: 07/21/2023  Time ECG obtained: 1110 AM Rate: 64 bpm Rhythm:  SR with 1st degree AV block Axis (leads I and aVF): normal Intervals: PR 270 ms. QRS 110 ms. QTc 455 ms. ST segment and T wave changes: No evidence of acute T wave abnormalities or significant ST segment elevation or depression.  Evidence of a possible, age undetermined, prior infarct:  Yes; anterior Comparison: Similar to previous tracing obtained on 12/21/2022   IMAGING / PROCEDURES: US PELVIC COMPLETE WITH TRANSVAGINAL performed on 09/05/2023 Abnormally thickened (12 mm) and heterogeneous Endometrium with suspicion of hypervascularity. In the setting of post-menopausal bleeding, endometrial sampling is indicated to exclude carcinoma. If results are benign, sonohysterogram should be considered for focal lesion work-up. (Ref: Radiological Reasoning: Algorithmic Workup of Abnormal Vaginal Bleeding with Endovaginal Sonography and Sonohysterography. AJR 2008; 161:W96-04)  Heterogeneous uterine myometrium, nonspecific in the setting of #1 but possibly related to anterior uterine fibroids measuring 2-4 cm diameter. Nonvisualized ovaries. No pelvic free fluid.  MYOCARDIAL PERFUSION IMAGING STUDY (LEXISCAN) performed on 08/11/2023 Normal left ventricular systolic function with a normal LVEF of >65% Normal myocardial thickening and wall motion Left ventricular cavity size normal SPECT images demonstrate homogenous tracer distribution throughout the myocardium No evidence of stress-induced myocardial ischemia or arrhythmia Normal low risk study  TRANSTHORACIC ECHOCARDIOGRAM performed on 08/04/2023 Normal left ventricular systolic function with an EF of >55% Normal left ventricular diastolic Doppler parameters Normal right ventricular systolic function Trivial MR and TR Normal transvalvular gradients; no valvular stenosis No  pericardial effusion  Impression and Plan:  Wendy Marsh has been referred for pre-anesthesia review and clearance prior to her undergoing the planned anesthetic and procedural courses. Available labs, pertinent testing, and imaging results were personally reviewed by me in preparation for upcoming operative/procedural course. [facility] medical record has been updated following extensive record review and patient interview with PAT staff.   This patient has been appropriately cleared by cardiology with an overall LOW risk of experiencing significant perioperative cardiovascular complications. Based on clinical review performed today (11/24/23), barring any significant acute changes in the patient's overall condition, it is anticipated that she will be able to proceed with the planned surgical intervention. Any acute changes in clinical condition may necessitate her procedure being postponed and/or cancelled. Patient will meet with anesthesia team (MD and/or CRNA) on the day of her procedure for preoperative evaluation/assessment. Questions regarding anesthetic course will be fielded at that time.   Pre-surgical instructions were reviewed with the patient during his PAT appointment, and questions were fielded to satisfaction by PAT clinical staff. She has been instructed on which medications that she will need to hold prior to surgery, as well as the ones that have been deemed safe/appropriate to take on the day of his procedure. As part of the general education provided by PAT, patient made aware both verbally and in writing, that she would need to abstain from the use of any illegal substances during his perioperative course. She was advised that failure to follow the provided instructions could necessitate case cancellation or result in serious perioperative complications up to and including death. Patient encouraged to contact PAT and/or her surgeon's office to discuss any questions or concerns  that may arise prior to surgery; verbalized understanding.   Quentin Mulling, MSN, APRN, FNP-C, CEN [facility]  Perioperative Services Nurse Practitioner Phone: [phone] Fax: [phone] 11/24/23 1:01 PM  NOTE: This note has been prepared using Dragon dictation software. Despite my best ability to proofread, there is always the potential that unintentional transcriptional errors may still occur from this process.

## 2023-11-27 ENCOUNTER — Other Ambulatory Visit: Payer: Self-pay

## 2023-11-27 ENCOUNTER — Ambulatory Visit: Payer: Medicare Other | Admitting: Urgent Care

## 2023-11-27 ENCOUNTER — Ambulatory Visit
Admission: RE | Admit: 2023-11-27 | Discharge: 2023-11-27 | Disposition: A | Discharge status: Home / Self Care | Payer: Medicare Other | Attending: Obstetrics and Gynecology | Admitting: Obstetrics and Gynecology

## 2023-11-27 ENCOUNTER — Other Ambulatory Visit: Payer: Self-pay | Admitting: Obstetrics and Gynecology

## 2023-11-27 ENCOUNTER — Encounter
Admission: RE | Disposition: A | Discharge status: Home / Self Care | Payer: Self-pay | Source: Home / Self Care | Attending: Obstetrics and Gynecology

## 2023-11-27 ENCOUNTER — Encounter: Payer: Self-pay | Admitting: Obstetrics and Gynecology

## 2023-11-27 DIAGNOSIS — I10 Essential (primary) hypertension: Secondary | ICD-10-CM | POA: Insufficient documentation

## 2023-11-27 DIAGNOSIS — E782 Mixed hyperlipidemia: Secondary | ICD-10-CM | POA: Insufficient documentation

## 2023-11-27 DIAGNOSIS — E039 Hypothyroidism, unspecified: Secondary | ICD-10-CM | POA: Diagnosis not present

## 2023-11-27 DIAGNOSIS — Z30430 Encounter for insertion of intrauterine contraceptive device: Secondary | ICD-10-CM | POA: Insufficient documentation

## 2023-11-27 DIAGNOSIS — I4819 Other persistent atrial fibrillation: Secondary | ICD-10-CM | POA: Insufficient documentation

## 2023-11-27 DIAGNOSIS — N840 Polyp of corpus uteri: Secondary | ICD-10-CM | POA: Diagnosis present

## 2023-11-27 DIAGNOSIS — Z79890 Hormone replacement therapy (postmenopausal): Secondary | ICD-10-CM | POA: Insufficient documentation

## 2023-11-27 HISTORY — DX: Failed or difficult intubation, initial encounter: T88.4XXA

## 2023-11-27 HISTORY — DX: Gout, unspecified: M10.9

## 2023-11-27 HISTORY — DX: Other forms of dyspnea: R06.09

## 2023-11-27 HISTORY — PX: HYSTEROSCOPY WITH D & C: SHX1775

## 2023-11-27 HISTORY — DX: Polyp of corpus uteri: N84.0

## 2023-11-27 HISTORY — DX: Long term (current) use of anticoagulants: Z79.01

## 2023-11-27 HISTORY — PX: INTRAUTERINE DEVICE (IUD) INSERTION: SHX5877

## 2023-11-27 SURGERY — DILATATION AND CURETTAGE /HYSTEROSCOPY
Anesthesia: General

## 2023-11-27 MED ORDER — PROPOFOL 10 MG/ML IV BOLUS
INTRAVENOUS | Status: AC
  Filled 2023-11-27: qty 20

## 2023-11-27 MED ORDER — FENTANYL CITRATE (PF) 100 MCG/2ML IJ SOLN
INTRAMUSCULAR | Status: AC
  Filled 2023-11-27: qty 2

## 2023-11-27 MED ORDER — MIDAZOLAM HCL 2 MG/2ML IJ SOLN
INTRAMUSCULAR | Status: AC
  Filled 2023-11-27: qty 2

## 2023-11-27 MED ORDER — LEVONORGESTREL 20 MCG/DAY IU IUD: 1.0000 | INTRAUTERINE_SYSTEM | Freq: Once | INTRAUTERINE | Status: DC

## 2023-11-27 MED ORDER — FENTANYL CITRATE (PF) 100 MCG/2ML IJ SOLN: 25.0000 ug | INTRAMUSCULAR | Status: DC | PRN

## 2023-11-27 MED ORDER — ONDANSETRON HCL 4 MG/2ML IJ SOLN
INTRAMUSCULAR | Status: AC
  Filled 2023-11-27: qty 2

## 2023-11-27 MED ORDER — ORAL CARE MOUTH RINSE: 15.0000 mL | Freq: Once | OROMUCOSAL | Status: AC

## 2023-11-27 MED ORDER — SILVER NITRATE-POT NITRATE 75-25 % EX MISC
CUTANEOUS | Status: AC
  Filled 2023-11-27: qty 10

## 2023-11-27 MED ORDER — LACTATED RINGERS IV SOLN: INTRAVENOUS | Status: DC

## 2023-11-27 MED ORDER — ONDANSETRON HCL 4 MG/2ML IJ SOLN
INTRAMUSCULAR | Status: DC | PRN
Start: 2023-11-27 — End: 2023-11-27
  Administered 2023-11-27: 4 mg via INTRAVENOUS

## 2023-11-27 MED ORDER — DEXAMETHASONE SODIUM PHOSPHATE 10 MG/ML IJ SOLN
INTRAMUSCULAR | Status: DC | PRN
  Administered 2023-11-27: 10 mg via INTRAVENOUS

## 2023-11-27 MED ORDER — CHLORHEXIDINE GLUCONATE 0.12 % MT SOLN
15.0000 mL | Freq: Once | OROMUCOSAL | Status: AC
  Administered 2023-11-27: 15 mL via OROMUCOSAL

## 2023-11-27 MED ORDER — PHENYLEPHRINE 80 MCG/ML (10ML) SYRINGE FOR IV PUSH (FOR BLOOD PRESSURE SUPPORT)
PREFILLED_SYRINGE | INTRAVENOUS | Status: DC | PRN
  Administered 2023-11-27 (×3): 160 ug via INTRAVENOUS

## 2023-11-27 MED ORDER — OXYCODONE HCL 5 MG/5ML PO SOLN: 5.0000 mg | Freq: Once | ORAL | Status: DC | PRN

## 2023-11-27 MED ORDER — EPHEDRINE SULFATE-NACL 50-0.9 MG/10ML-% IV SOSY
PREFILLED_SYRINGE | INTRAVENOUS | Status: DC | PRN
  Administered 2023-11-27: 10 mg via INTRAVENOUS
  Administered 2023-11-27: 5 mg via INTRAVENOUS
  Administered 2023-11-27 (×2): 10 mg via INTRAVENOUS

## 2023-11-27 MED ORDER — PROPOFOL 10 MG/ML IV BOLUS
INTRAVENOUS | Status: DC | PRN
  Administered 2023-11-27: 50 ug/kg/min via INTRAVENOUS
  Administered 2023-11-27: 200 mg via INTRAVENOUS
  Administered 2023-11-27: 50 mg via INTRAVENOUS

## 2023-11-27 MED ORDER — CHLORHEXIDINE GLUCONATE 0.12 % MT SOLN
OROMUCOSAL | Status: AC
  Filled 2023-11-27: qty 15

## 2023-11-27 MED ORDER — PHENYLEPHRINE 80 MCG/ML (10ML) SYRINGE FOR IV PUSH (FOR BLOOD PRESSURE SUPPORT)
PREFILLED_SYRINGE | INTRAVENOUS | Status: AC
Start: 2023-11-27 — End: ?
  Filled 2023-11-27: qty 10

## 2023-11-27 MED ORDER — LEVONORGESTREL 20 MCG/DAY IU IUD
INTRAUTERINE_SYSTEM | INTRAUTERINE | Status: AC
  Filled 2023-11-27: qty 1

## 2023-11-27 MED ORDER — OXYCODONE HCL 5 MG PO TABS: 5.0000 mg | ORAL_TABLET | Freq: Once | ORAL | Status: DC | PRN

## 2023-11-27 MED ORDER — EPHEDRINE 5 MG/ML INJ
INTRAVENOUS | Status: AC
  Filled 2023-11-27: qty 5

## 2023-11-27 MED ORDER — SUCCINYLCHOLINE CHLORIDE 200 MG/10ML IV SOSY
PREFILLED_SYRINGE | INTRAVENOUS | Status: DC | PRN
  Administered 2023-11-27: 180 mg via INTRAVENOUS

## 2023-11-27 MED ORDER — PROPOFOL 10 MG/ML IV BOLUS
INTRAVENOUS | Status: AC
  Filled 2023-11-27: qty 40

## 2023-11-27 MED ORDER — FENTANYL CITRATE (PF) 100 MCG/2ML IJ SOLN
INTRAMUSCULAR | Status: DC | PRN
  Administered 2023-11-27: 50 ug via INTRAVENOUS
  Administered 2023-11-27: 25 ug via INTRAVENOUS
  Administered 2023-11-27: 50 ug via INTRAVENOUS

## 2023-11-27 MED ORDER — MIDAZOLAM HCL 2 MG/2ML IJ SOLN
INTRAMUSCULAR | Status: DC | PRN
  Administered 2023-11-27: 2 mg via INTRAVENOUS

## 2023-11-27 MED ORDER — POVIDONE-IODINE 10 % EX SWAB: 2.0000 | Freq: Once | CUTANEOUS | Status: DC

## 2023-11-27 MED ORDER — DEXAMETHASONE SODIUM PHOSPHATE 10 MG/ML IJ SOLN
INTRAMUSCULAR | Status: AC
  Filled 2023-11-27: qty 1

## 2023-11-27 MED ORDER — PROPOFOL 1000 MG/100ML IV EMUL
INTRAVENOUS | Status: AC
  Filled 2023-11-27: qty 100

## 2023-11-27 MED ORDER — HYDROCODONE-ACETAMINOPHEN 5-325 MG PO TABS: 1.0000 | ORAL_TABLET | Freq: Four times a day (QID) | ORAL | 0 refills | Status: DC | PRN

## 2023-11-27 MED ORDER — LIDOCAINE HCL (CARDIAC) PF 100 MG/5ML IV SOSY
PREFILLED_SYRINGE | INTRAVENOUS | Status: DC | PRN
  Administered 2023-11-27: 100 mg via INTRAVENOUS

## 2023-11-27 SURGICAL SUPPLY — 25 items
CUP MEDICINE 2OZ PLAST GRAD ST (MISCELLANEOUS) ×1 IMPLANT
DRAPE UNDER BUTTOCK W/FLU (DRAPES) ×1 IMPLANT
DRSG TELFA 3X8 NADH STRL (GAUZE/BANDAGES/DRESSINGS) ×1 IMPLANT
GLOVE INDICATOR 7.0 STRL GRN (GLOVE) ×1 IMPLANT
GLOVE PI ORTHO PRO STRL 7.5 (GLOVE) ×1 IMPLANT
GOWN STRL REUS W/ TWL LRG LVL3 (GOWN DISPOSABLE) ×1 IMPLANT
IV NS 1000ML BAXH (IV SOLUTION) IMPLANT
IV NS IRRIG 3000ML ARTHROMATIC (IV SOLUTION) ×1 IMPLANT
KIT PROCEDURE FLUENT (KITS) ×1 IMPLANT
KIT TURNOVER CYSTO (KITS) ×1 IMPLANT
MANIFOLD NEPTUNE II (INSTRUMENTS) ×1 IMPLANT
NS IRRIG 500ML POUR BTL (IV SOLUTION) ×1 IMPLANT
PACK DNC HYST (MISCELLANEOUS) ×1 IMPLANT
PAD OB MATERNITY 11 LF (PERSONAL CARE ITEMS) ×1 IMPLANT
PAD PREP OB/GYN DISP 24X41 (PERSONAL CARE ITEMS) ×1 IMPLANT
SCRUB CHG 4% DYNA-HEX 4OZ (MISCELLANEOUS) ×1 IMPLANT
SEAL ROD LENS SCOPE MYOSURE (ABLATOR) ×1 IMPLANT
SET CYSTO W/LG BORE CLAMP LF (SET/KITS/TRAYS/PACK) IMPLANT
SOL PREP PVP 2OZ (MISCELLANEOUS) ×1 IMPLANT
SOLUTION PREP PVP 2OZ (MISCELLANEOUS) ×1 IMPLANT
SYS MIRENA INTRAUTERINE (MISCELLANEOUS) ×1 IMPLANT
SYSTEM MIRENA INTRAUTERINE (MISCELLANEOUS) IMPLANT
TOWEL OR 17X26 4PK STRL BLUE (TOWEL DISPOSABLE) ×1 IMPLANT
TRAP FLUID SMOKE EVACUATOR (MISCELLANEOUS) ×1 IMPLANT
WATER STERILE IRR 500ML POUR (IV SOLUTION) ×1 IMPLANT

## 2023-11-27 NOTE — Anesthesia Preprocedure Evaluation (Signed)
 Anesthesia Evaluation  Patient identified by MRN, date of birth, ID band Patient awake    Reviewed: Allergy & Precautions, NPO status , Patient's Chart, lab work & pertinent test results  History of Anesthesia Complications Negative for: history of anesthetic complications  Airway Mallampati: III  TM Distance: <3 FB Neck ROM: full    Dental  (+) Chipped, Poor Dentition, Missing   Pulmonary neg pulmonary ROS   Pulmonary exam normal        Cardiovascular hypertension, (-) angina Normal cardiovascular exam     Neuro/Psych negative neurological ROS  negative psych ROS   GI/Hepatic negative GI ROS, Neg liver ROS,neg GERD  ,,  Endo/Other  Hypothyroidism  Class 4 obesity  Renal/GU      Musculoskeletal   Abdominal   Peds  Hematology negative hematology ROS (+)   Anesthesia Other Findings Past Medical History: No date: Arthritis No date: Benign essential hypertension No date: DOE (dyspnea on exertion) No date: Endometrial polyp No date: Family history of adverse reaction to anesthesia     Comment:  a.) PONV in 1st degree relative (sister) No date: Gout 09/2020: History of 2019 novel coronavirus disease (COVID-19) No date: Hypothyroidism 04/2018: Invasive carcinoma of RIGHT breast (HCC)     Comment:  a.) pathology (+) IMC (T2, N0; G3, ER/PR -, HER2/neu -);              Tx'd with neoadjuvant systemic antineoplastic               chemotherapy + surgical excision (lumpectomy) No date: Lumbago No date: Mixed hyperlipidemia No date: Morbid obesity with BMI of 50.0-59.9, adult (HCC) No date: On apixaban therapy No date: Osteoporosis No date: Persistent atrial fibrillation (HCC)     Comment:  a.) CHA2DS2VASc = 3 (age, sex, HTN) as of 11/24/2023;               b.) rate/rhythm maintained on oral flecainide +               metoprolol succinate; chronically anticoagulated with               apixaban No date: Personal  history of chemotherapy No date: Personal history of radiation therapy  Past Surgical History: 05/02/2018: BREAST BIOPSY; Right     Comment:  Invasive mammary carcinoma, grade 3, neo adj chemo 10/29/2018: BREAST LUMPECTOMY; Right     Comment:  NO RESIDUAL MALIGNANCY IDENTIFIED.  10/29/2018: BREAST LUMPECTOMY WITH NEEDLE LOCALIZATION AND AXILLARY  SENTINEL LYMPH NODE BX; Right     Comment:  Procedure: BREAST LUMPECTOMY WITH NEEDLE LOCALIZATION               AND SENTINEL LYMPH NODE BX;  Surgeon: Ancil Linsey,               MD;  Location: [facility] ORS;  Service: General;  Laterality:               Right; 2024: CATARACT EXTRACTION W/ INTRAOCULAR LENS  IMPLANT, BILATERAL;  Bilateral 2022: PORT-A-CATH REMOVAL 05/11/2018: PORTACATH PLACEMENT; Right     Comment:  Procedure: INSERTION  I.J PORT-A-CATH;  Surgeon:               Earline Mayotte, MD;  Location: [facility] ORS;  Service:               General;  Laterality: Right; 2010: REPLACEMENT TOTAL KNEE; Left  BMI    Body Mass Index: 55.97 kg/m  Reproductive/Obstetrics negative OB ROS                             Anesthesia Physical Anesthesia Plan  ASA: 3  Anesthesia Plan: General ETT   Post-op Pain Management:    Induction: Intravenous  PONV Risk Score and Plan: Ondansetron, Dexamethasone, Midazolam and Treatment may vary due to age or medical condition  Airway Management Planned: Oral ETT and Video Laryngoscope Planned  Additional Equipment:   Intra-op Plan:   Post-operative Plan: Extubation in OR  Informed Consent: I have reviewed the patients History and Physical, chart, labs and discussed the procedure including the risks, benefits and alternatives for the proposed anesthesia with the patient or authorized representative who has indicated his/her understanding and acceptance.     Dental Advisory Given  Plan Discussed with: Anesthesiologist, CRNA and Surgeon  Anesthesia Plan Comments:  (Patient consented for risks of anesthesia including but not limited to:  - adverse reactions to medications - damage to eyes, teeth, lips or other oral mucosa - nerve damage due to positioning  - sore throat or hoarseness - Damage to heart, brain, nerves, lungs, other parts of body or loss of life  Patient voiced understanding and assent.)       Anesthesia Quick Evaluation

## 2023-11-27 NOTE — Transfer of Care (Signed)
 Immediate Anesthesia Transfer of Care Note  Patient: Wendy Marsh  Procedure(s) Performed: D&C, IUD PLACEMENT INTRAUTERINE DEVICE (IUD) PLACEMENT  Patient Location: PACU  Anesthesia Type:General  Level of Consciousness: awake, drowsy, and patient cooperative  Airway & Oxygen Therapy: Patient Spontanous Breathing and Patient connected to face mask oxygen  Post-op Assessment: Report given to RN and Post -op Vital signs reviewed and stable  Post vital signs: Reviewed and stable  Last Vitals:  Vitals Value Taken Time  BP 103/52 11/27/23 1515  Temp 36.4 C 11/27/23 1512  Pulse 68 11/27/23 1516  Resp    SpO2 97 % 11/27/23 1516  Vitals shown include unfiled device data.  Last Pain:  Vitals:   11/27/23 1512  TempSrc:   PainSc: 0-No pain         Complications: There were no known notable events for this encounter.

## 2023-11-27 NOTE — Interval H&P Note (Signed)
 History and Physical Interval Note:  11/27/2023 1:58 PM  Wendy Marsh  has presented today for surgery, with the diagnosis of EIN, endometrial polyp.  The various methods of treatment have been discussed with the patient and family. After consideration of risks, benefits and other options for treatment, the patient has consented to  Procedure(s): HYSTEROSCOPY D&C, IUD PLACEMENT (N/A) INTRAUTERINE DEVICE (IUD) PLACEMENT (N/A) as a surgical intervention.  The patient's history has been reviewed, patient examined, no change in status, stable for surgery.  I have reviewed the patient's chart and labs.  Questions were answered to the patient's satisfaction.     [name]

## 2023-11-27 NOTE — Op Note (Signed)
   OPERATIVE NOTE 11/27/2023 3:00 PM  PRE-OPERATIVE DIAGNOSIS:  1) EIN, endometrial polyp  POST-OPERATIVE DIAGNOSIS:  1) Same  OPERATION:  D&C  Hysteroscopy  SURGEON(S): Surgeons and Role:    Linzie Collin, MD - Primary   ANESTHESIA: Choice  ESTIMATED BLOOD LOSS: 35mL  OPERATIVE FINDINGS:   SPECIMEN:  ID Type Source Tests Collected by Time Destination  1 : Endometrial Curettage GYN Endometrium Curettage SURGICAL PATHOLOGY Linzie Collin, MD 11/27/2023 1451     COMPLICATIONS: None  DRAINS: None  DISPOSITION: Stable to recovery room  DESCRIPTION OF PROCEDURE:      The patient was prepped and draped in the dorsal lithotomy position and placed under general anesthesia. Her bladder was emptied. The cervix was grasped with a Jacob's tenaculum.  An extra-large speculum was required to visualize the cervix.  The cervix was grasped with a Christella Hartigan tenaculum which was limited by its travel because of the speculum.  Respecting the position and curvature of her cervix, it was dilated to accommodate the hysteroscope.  I attempted to place the hysteroscope but because of the angle of the uterus and the length of the vagina and the position of the retractor the hysteroscope was unable to be placed in the correct angle to visualize the endometrial cavity.  A sharp curette was used to systematically scrape the endometrium in a circumferential manner until a gritty texture was noted.  The polyp forceps was limited in length and was unable to be placed within the endometrial cavity.  A second sharp curettage was performed until minimal tissue was noted.  Respecting the positioning curvature of the uterus and cervix and Mirena IUD was placed within the endometrial cavity.  The strings were cut to the appropriate length. The tenaculum was removed from the cervix and hemostasis was noted. The weighted speculum was removed and the patient went to recovery room in stable condition.  No follow-up  provider specified.  Elonda Husky, M.D. 11/27/2023 3:00 PM

## 2023-11-27 NOTE — Anesthesia Procedure Notes (Signed)
 Procedure Name: Intubation Date/Time: 11/27/2023 2:10 PM  Performed by: Lysbeth Penner, CRNAPre-anesthesia Checklist: Patient identified, Emergency Drugs available, Suction available and Patient being monitored Patient Re-evaluated:Patient Re-evaluated prior to induction Oxygen Delivery Method: Circle system utilized Preoxygenation: Pre-oxygenation with 100% oxygen Induction Type: IV induction Ventilation: Mask ventilation without difficulty Laryngoscope Size: McGrath and 3 Grade View: Grade I Tube type: Oral Tube size: 6.5 mm Number of attempts: 1 Airway Equipment and Method: Stylet and Oral airway Placement Confirmation: ETT inserted through vocal cords under direct vision, positive ETCO2 and breath sounds checked- equal and bilateral Secured at: 19 cm Tube secured with: Tape Dental Injury: Teeth and Oropharynx as per pre-operative assessment

## 2023-11-28 ENCOUNTER — Encounter: Payer: Self-pay | Admitting: Obstetrics and Gynecology

## 2023-11-28 NOTE — Anesthesia Postprocedure Evaluation (Signed)
 Anesthesia Post Note  Patient: Wendy Marsh  Procedure(s) Performed: D&C, IUD PLACEMENT INTRAUTERINE DEVICE (IUD) PLACEMENT  Patient location during evaluation: PACU Anesthesia Type: General Level of consciousness: awake and alert Pain management: pain level controlled Vital Signs Assessment: post-procedure vital signs reviewed and stable Respiratory status: spontaneous breathing, nonlabored ventilation, respiratory function stable and patient connected to nasal cannula oxygen Cardiovascular status: blood pressure returned to baseline and stable Postop Assessment: no apparent nausea or vomiting Anesthetic complications: no   There were no known notable events for this encounter.   Last Vitals:  Vitals:   11/27/23 1545 11/27/23 1600  BP: (!) 98/52 (!) 147/64  Pulse: 64 65  Resp:  16  Temp: (!) 36.1 C (!) 36.3 C  SpO2: 92% 97%    Last Pain:  Vitals:   11/27/23 1600  TempSrc: Temporal  PainSc: 0-No pain                 Cleda Mccreedy Nolah Krenzer

## 2023-11-29 LAB — SURGICAL PATHOLOGY

## 2023-12-05 ENCOUNTER — Ambulatory Visit: Payer: Medicare Other | Admitting: Family Medicine

## 2023-12-12 ENCOUNTER — Encounter: Payer: Self-pay | Admitting: Obstetrics and Gynecology

## 2023-12-12 ENCOUNTER — Ambulatory Visit: Payer: Medicare Other | Admitting: Obstetrics and Gynecology

## 2023-12-12 VITALS — BP 121/82 | HR 65 | Ht 62.0 in | Wt 308.1 lb

## 2023-12-12 DIAGNOSIS — Z4889 Encounter for other specified surgical aftercare: Secondary | ICD-10-CM

## 2023-12-12 DIAGNOSIS — Z9889 Other specified postprocedural states: Secondary | ICD-10-CM

## 2023-12-12 NOTE — Progress Notes (Signed)
 Patient presents for 2 week postop follow-up following D&C and IUD insertion. She states bleeding is minimal at this time, reports no pain and trouble with urination.

## 2023-12-12 NOTE — Progress Notes (Signed)
 HPI:      [name] is a 68 y.o. [phone] who LMP was Patient's last menstrual period was 03/17/2009 (approximate).  Subjective:   She presents today 2 weeks postop from [facility] hysteroscopy placement of Mirena IUD for EIN.  She reports that she is doing well.  Minimal cramping occasional spotting but no significant bleeding.    Hx: The following portions of the patient's history were reviewed and updated as appropriate:             She  has a past medical history of Arthritis, Benign essential hypertension, Difficult intubation, DOE (dyspnea on exertion), Endometrial polyp, Family history of adverse reaction to anesthesia, Gout, History of 2019 novel coronavirus disease (COVID-19) (09/2020), Hypothyroidism, Invasive carcinoma of RIGHT breast (HCC) (04/2018), Lumbago, Mixed hyperlipidemia, Morbid obesity with BMI of 50.0-59.9, adult (HCC), On apixaban therapy, Osteoporosis, Persistent atrial fibrillation (HCC), Personal history of chemotherapy, and Personal history of radiation therapy. She does not have any pertinent problems on file. She  has a past surgical history that includes Replacement total knee (Left, 2010); Portacath placement (Right, 05/11/2018); Breast lumpectomy with needle localization and axillary sentinel lymph node bx (Right, 10/29/2018); Breast lumpectomy (Right, 10/29/2018); Breast biopsy (Right, 05/02/2018); Cataract extraction w/ intraocular lens  implant, bilateral (Bilateral, 2024); Port-a-cath removal (2022); Hysteroscopy with D & C (N/A, 11/27/2023); and Intrauterine device (iud) insertion (N/A, 11/27/2023). Her family history includes Breast cancer in her maternal grandmother; Breast cancer (age of onset: 38) in an other family member; Cancer (age of onset: 64) in her maternal uncle; Congestive Heart Failure in her mother; Diabetes in her mother and another family member; Hypertension in an other family member; Kidney disease in an other family member; Other in her  father; Thyroid disease in an other family member. She  reports that she has never smoked. She has never used smokeless tobacco. She reports that she does not drink alcohol and does not use drugs. She has a current medication list which includes the following prescription(s): allopurinol, amlodipine, apixaban, epinephrine, flecainide, levothyroxine, lisinopril-hydrochlorothiazide, and metoprolol succinate. She is allergic to gabapentin, morphine and codeine, peanut-containing drug products, and tape.       Review of Systems:  Review of Systems  Constitutional: Denied constitutional symptoms, night sweats, recent illness, fatigue, fever, insomnia and weight loss.  Eyes: Denied eye symptoms, eye pain, photophobia, vision change and visual disturbance.  Ears/Nose/Throat/Neck: Denied ear, nose, throat or neck symptoms, hearing loss, nasal discharge, sinus congestion and sore throat.  Cardiovascular: Denied cardiovascular symptoms, arrhythmia, chest pain/pressure, edema, exercise intolerance, orthopnea and palpitations.  Respiratory: Denied pulmonary symptoms, asthma, pleuritic pain, productive sputum, cough, dyspnea and wheezing.  Gastrointestinal: Denied, gastro-esophageal reflux, melena, nausea and vomiting.  Genitourinary: Denied genitourinary symptoms including symptomatic vaginal discharge, pelvic relaxation issues, and urinary complaints.  Musculoskeletal: Denied musculoskeletal symptoms, stiffness, swelling, muscle weakness and myalgia.  Dermatologic: Denied dermatology symptoms, rash and scar.  Neurologic: Denied neurology symptoms, dizziness, headache, neck pain and syncope.  Psychiatric: Denied psychiatric symptoms, anxiety and depression.  Endocrine: Denied endocrine symptoms including hot flashes and night sweats.   Meds:   Current Outpatient Medications on File Prior to Visit  Medication Sig Dispense Refill   allopurinol (ZYLOPRIM) 300 MG tablet Take 1 tablet (300 mg total) by mouth  daily. 90 tablet 1   amLODipine (NORVASC) 2.5 MG tablet Take 0.5 tablets (1.25 mg total) by mouth daily. 15 tablet 2   apixaban (ELIQUIS) 5 MG TABS tablet Take 5 mg by mouth in the  morning and at bedtime.     EPINEPHrine 0.3 mg/0.3 mL IJ SOAJ injection Inject 0.3 mg into the muscle as needed for anaphylaxis. 1 each 12   flecainide (TAMBOCOR) 100 MG tablet Take 100 mg by mouth 2 (two) times daily.     levothyroxine (SYNTHROID) 200 MCG tablet Take 1 tablet (200 mcg total) by mouth daily before breakfast. 90 tablet 3   lisinopril-hydrochlorothiazide (ZESTORETIC) 20-25 MG tablet Take 2 tablets by mouth daily. 180 tablet 1   metoprolol succinate (TOPROL-XL) 50 MG 24 hr tablet Take with or immediately following a meal. (Patient taking differently: Take 50 mg by mouth daily. Take with or immediately following a meal.) 90 tablet 1   No current facility-administered medications on file prior to visit.      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 Postoperative state     Doing well postop-pathology reveals endometrial  polyps-EIN not seen.   Plan:            1.  Expectant IUD to limit bleeding and to fix any hyperplasia present.  She will contact us if her bleeding becomes exceptionally heavy.  Plan follow-up in 3 months. Orders No orders of the defined types were placed in this encounter.   No orders of the defined types were placed in this encounter.     F/U  Return in about 3 months (around 03/13/2024).  Elonda Husky, M.D. 12/12/2023 9:58 AM

## 2023-12-25 ENCOUNTER — Encounter: Payer: Self-pay | Admitting: Family Medicine

## 2023-12-25 ENCOUNTER — Ambulatory Visit (INDEPENDENT_AMBULATORY_CARE_PROVIDER_SITE_OTHER): Admitting: Family Medicine

## 2023-12-25 VITALS — BP 120/70 | HR 59 | Temp 97.9°F | Resp 17 | Ht 62.01 in | Wt 304.4 lb

## 2023-12-25 DIAGNOSIS — I10 Essential (primary) hypertension: Secondary | ICD-10-CM | POA: Diagnosis not present

## 2023-12-25 DIAGNOSIS — Z1211 Encounter for screening for malignant neoplasm of colon: Secondary | ICD-10-CM

## 2023-12-25 MED ORDER — AMLODIPINE BESYLATE 2.5 MG PO TABS: 1.2500 mg | ORAL_TABLET | Freq: Every day | ORAL | 1 refills | Status: DC

## 2023-12-25 MED ORDER — LOSARTAN POTASSIUM-HCTZ 100-25 MG PO TABS: 1.0000 | ORAL_TABLET | Freq: Every day | ORAL | 0 refills | Status: DC

## 2023-12-25 NOTE — Progress Notes (Signed)
 BP 120/70 (BP Location: Left Arm, Patient Position: Sitting, Cuff Size: Large)   Pulse (!) 59   Temp 97.9 F (36.6 C) (Oral)   Resp 17   Ht 5' 2.01" (1.575 m)   Wt (!) 304 lb 6.4 oz (138.1 kg)   LMP 03/17/2009 (Approximate)   SpO2 99%   BMI 55.66 kg/m    Subjective:    Patient ID: Wendy Marsh, female    DOB: November 27, 1955, 68 y.o.   MRN: [id number]  HPI: Wendy Marsh is a 68 y.o. female  Chief Complaint  Patient presents with   Hypertension    Blood pressure recheck.    HYPERTENSION  Hypertension status: better  Satisfied with current treatment? yes Duration of hypertension: chronic BP monitoring frequency:  a few times a month BP medication side effects:  no Medication compliance: excellent compliance Previous BP meds: lisinopril, hydrochlorothiazide, metoprolol amlodipine Aspirin: no Recurrent headaches: no Visual changes: no Palpitations: no Dyspnea: no Chest pain: no Lower extremity edema: no Dizzy/lightheaded: no  Relevant past medical, surgical, family and social history reviewed and updated as indicated. Interim medical history since our last visit reviewed. Allergies and medications reviewed and updated.  Review of Systems  Constitutional: Negative.   Respiratory: Negative.    Cardiovascular: Negative.   Musculoskeletal: Negative.   Neurological: Negative.   Psychiatric/Behavioral: Negative.      Per HPI unless specifically indicated above     Objective:    BP 120/70 (BP Location: Left Arm, Patient Position: Sitting, Cuff Size: Large)   Pulse (!) 59   Temp 97.9 F (36.6 C) (Oral)   Resp 17   Ht 5' 2.01" (1.575 m)   Wt (!) 304 lb 6.4 oz (138.1 kg)   LMP 03/17/2009 (Approximate)   SpO2 99%   BMI 55.66 kg/m   Wt Readings from Last 3 Encounters:  12/25/23 (!) 304 lb 6.4 oz (138.1 kg)  12/12/23 (!) 308 lb 1.6 oz (139.8 kg)  11/27/23 (!) 306 lb (138.8 kg)    Physical Exam Vitals and nursing note reviewed.  Constitutional:       General: She is not in acute distress.    Appearance: Normal appearance. She is obese. She is not ill-appearing, toxic-appearing or diaphoretic.  HENT:     Head: Normocephalic and atraumatic.     Right Ear: External ear normal.     Left Ear: External ear normal.     Nose: Nose normal.     Mouth/Throat:     Mouth: Mucous membranes are moist.     Pharynx: Oropharynx is clear.  Eyes:     General: No scleral icterus.       Right eye: No discharge.        Left eye: No discharge.     Extraocular Movements: Extraocular movements intact.     Conjunctiva/sclera: Conjunctivae normal.     Pupils: Pupils are equal, round, and reactive to light.  Cardiovascular:     Rate and Rhythm: Normal rate and regular rhythm.     Pulses: Normal pulses.     Heart sounds: Normal heart sounds. No murmur heard.    No friction rub. No gallop.  Pulmonary:     Effort: Pulmonary effort is normal. No respiratory distress.     Breath sounds: Normal breath sounds. No stridor. No wheezing, rhonchi or rales.  Chest:     Chest wall: No tenderness.  Musculoskeletal:        General: Normal range of motion.  Cervical back: Normal range of motion and neck supple.  Skin:    General: Skin is warm and dry.     Capillary Refill: Capillary refill takes less than 2 seconds.     Coloration: Skin is not jaundiced or pale.     Findings: No bruising, erythema, lesion or rash.  Neurological:     General: No focal deficit present.     Mental Status: She is alert and oriented to person, place, and time. Mental status is at baseline.  Psychiatric:        Mood and Affect: Mood normal.        Behavior: Behavior normal.        Thought Content: Thought content normal.        Judgment: Judgment normal.     Results for orders placed or performed in visit on 11/27/23  Surgical pathology   Collection Time: 11/27/23 12:00 AM  Result Value Ref Range   SURGICAL PATHOLOGY      SURGICAL PATHOLOGY [facility] [address] [place], [place] [address] Telephone [phone] or [phone] Fax [phone]  REPORT OF SURGICAL PATHOLOGY   Accession #: [id number] Patient Name: Wendy Marsh Visit # :   MRN: [id number] Physician: Linzie Collin DOB/Age [birth year] (Age: 70) Gender: F Collected Date: 11/27/2023 Received Date: 11/27/2023  FINAL DIAGNOSIS       1. Endometrium, curettage,  :       - FRAGMENTS OF ENDOMETRIAL AND ENDOCERVICAL POLYPS.      - NEGATIVE FOR ATYPIA / EIN AND MALIGNANCY.      - SEE NOTE.       Diagnosis Note : The slides on the patient's recent biopsy demonstrating EIN      arising within an endometrial polyp ([id number]) were reviewed in conjunction      with this case. This case underwent intradepartmental consultation and Dr.      [place] concurs with the interpretation.      DATE SIGNED OUT: 11/29/2023 ELECTRONIC SIGNATURE : Oneita Kras Md, Delice Bison , Pathologist, Electroni c Signature  MICROSCOPIC DESCRIPTION  CASE COMMENTS STAINS USED IN DIAGNOSIS: H&E H&E    CLINICAL HISTORY  SPECIMEN(S) OBTAINED 1. Endometrium, curettage,  SPECIMEN COMMENTS: SPECIMEN CLINICAL INFORMATION: 1. EIN, endometrial polyp    Gross Description 1. "Endometrial curettage", received fresh on a Telfa pad is a 3.8 x 2.4 x 0.5 cm aggregate of pink-tan, soft tissue admixed with a moderate amount of hemorrhagic material. The specimen is submitted in toto in 2 blocks (1A-B).      AMG 11/27/2023        Report signed out from the following location(s) [place] [address], [place] [address] CLIA #: [id number]  [facility] [address] [place], [place] [address] CLIA #: [id number]       Assessment & Plan:   Problem List Items Addressed This Visit       Cardiovascular and Mediastinum   Benign essential hypertension - Primary   Under good control on current regimen. Continue current  regimen. Continue to monitor. Call with any concerns. Refills given. Will change lisinopril-hydrochlorothiazide to losartan-hydrochlorothiazide at patient request and recheck as scheduled in June.         Relevant Medications   losartan-hydrochlorothiazide (HYZAAR) 100-25 MG tablet   amLODipine (NORVASC) 2.5 MG tablet   Other Relevant Orders   Basic metabolic panel   Other Visit Diagnoses  Screening for colon cancer       Cologuard ordered today.   Relevant Orders   Cologuard        Follow up plan: Return in about 10 weeks (around 03/04/2024).

## 2023-12-25 NOTE — Assessment & Plan Note (Signed)
 Under good control on current regimen. Continue current regimen. Continue to monitor. Call with any concerns. Refills given. Will change lisinopril-hydrochlorothiazide to losartan-hydrochlorothiazide at patient request and recheck as scheduled in June.

## 2023-12-26 ENCOUNTER — Encounter: Payer: Medicare Other | Admitting: Obstetrics and Gynecology

## 2023-12-26 ENCOUNTER — Encounter: Payer: Self-pay | Admitting: Family Medicine

## 2023-12-26 LAB — BASIC METABOLIC PANEL
BUN/Creatinine Ratio: 17 (ref 12–28)
BUN: 17 mg/dL (ref 8–27)
CO2: 26 mmol/L (ref 20–29)
Calcium: 9.5 mg/dL (ref 8.7–10.3)
Chloride: 97 mmol/L (ref 96–106)
Creatinine, Ser: 1.03 mg/dL — ABNORMAL HIGH (ref 0.57–1.00)
Glucose: 83 mg/dL (ref 70–99)
Potassium: 3.7 mmol/L (ref 3.5–5.2)
Sodium: 140 mmol/L (ref 134–144)
eGFR: 59 mL/min/{1.73_m2} — ABNORMAL LOW (ref >59)

## 2023-12-28 ENCOUNTER — Telehealth: Payer: Self-pay | Admitting: Family Medicine

## 2023-12-28 ENCOUNTER — Encounter: Payer: Self-pay | Admitting: Family Medicine

## 2023-12-28 NOTE — Telephone Encounter (Signed)
 FYI: Patients husband came in stating She had blood in urine. She has an appointment schedule for 01-02-24. Husband wants provider informed.

## 2023-12-31 ENCOUNTER — Encounter: Payer: Self-pay | Admitting: Emergency Medicine

## 2023-12-31 ENCOUNTER — Ambulatory Visit
Admission: EM | Admit: 2023-12-31 | Discharge: 2023-12-31 | Disposition: A | Discharge status: Home / Self Care | Attending: Emergency Medicine | Admitting: Emergency Medicine

## 2023-12-31 ENCOUNTER — Other Ambulatory Visit: Payer: Self-pay

## 2023-12-31 DIAGNOSIS — R102 Pelvic and perineal pain: Secondary | ICD-10-CM | POA: Insufficient documentation

## 2023-12-31 DIAGNOSIS — R300 Dysuria: Secondary | ICD-10-CM | POA: Diagnosis not present

## 2023-12-31 LAB — POCT URINALYSIS DIP (MANUAL ENTRY)
Bilirubin, UA: NEGATIVE
Glucose, UA: NEGATIVE mg/dL
Ketones, POC UA: NEGATIVE mg/dL
Nitrite, UA: NEGATIVE
Protein Ur, POC: >=300 mg/dL — AB
Spec Grav, UA: 1.015
Urobilinogen, UA: 1 U/dL
pH, UA: 7

## 2023-12-31 MED ORDER — CEPHALEXIN 500 MG PO CAPS: 500.0000 mg | ORAL_CAPSULE | Freq: Three times a day (TID) | ORAL | 0 refills | Status: AC

## 2023-12-31 NOTE — ED Triage Notes (Addendum)
 Patient presents to [facility] for evaluation of dysuria since Wednesday.  Patient states her kidney function was off at visit on Monday so they switched her BP meds . Wednesday she began to have small amounts of urine but not a lot coming out with terrible pain through out her stream.  She began drinking more water (which she admits to being bad about) and it got better, but the pain is still present at the end of her stream, and having pressure  Didn't notice blood in toilet, but did notice a pink tint when she was collecting some urine for her PCP (which they did not run).  Patient did have an IUD placed earlier this year, but denies changes in discharge

## 2023-12-31 NOTE — ED Provider Notes (Signed)
 Wendy Marsh    CSN: [id number] Arrival date & time: 12/31/23  0830      History   Chief Complaint Chief Complaint  Patient presents with   Dysuria    HPI Wendy Marsh is a 68 y.o. female.   Patient presents for evaluation of dysuria and lower abdominal pressure present for 4 days.  Progressively worsening.  Associated hematuria.  Has not attempted treatment.  Has had vaginal spotting due to [facility] with IUD placement, resolved 1 week ago, endorses wearing panty liner size pads, unsure if calls.  Endorses poor fluid intake but has made effort to increase.  Recent change in blood pressure medications.  Denies flank pain, fever, vaginal symptoms.   Past Medical History:  Diagnosis Date   Arthritis    Benign essential hypertension    Difficult intubation    DOE (dyspnea on exertion)    Endometrial polyp    Family history of adverse reaction to anesthesia    a.) PONV in 1st degree relative (sister)   Gout    History of 2019 novel coronavirus disease (COVID-19) 09/2020   Hypothyroidism    Invasive carcinoma of RIGHT breast (HCC) 04/2018   a.) pathology (+) IMC (T2, N0; G3, ER/PR -, HER2/neu -); Tx'd with neoadjuvant systemic antineoplastic chemotherapy + surgical excision (lumpectomy)   Lumbago    Mixed hyperlipidemia    Morbid obesity with BMI of 50.0-59.9, adult (HCC)    On apixaban therapy    Osteoporosis    Persistent atrial fibrillation (HCC)    a.) CHA2DS2VASc = 3 (age, sex, HTN) as of 11/24/2023; b.) rate/rhythm maintained on oral flecainide + metoprolol succinate; chronically anticoagulated with apixaban   Personal history of chemotherapy    Personal history of radiation therapy     Patient Active Problem List   Diagnosis Date Noted   Hallucination 05/15/2023   Acute gout involving toe of right foot 03/08/2021   Hypokalemia 11/05/2020   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   Malignant neoplasm of lower-outer quadrant of right  female breast (HCC) 05/08/2018   Goals of care, counseling/discussion 05/08/2018   Morbid obesity (HCC) 03/06/2018   Allergy to peanuts 02/06/2018   Thyroid activity decreased 02/26/2016   Paroxysmal atrial fibrillation (HCC) 01/22/2016   Primary osteoarthritis of right knee 09/22/2015   Combined fat and carbohydrate induced hyperlipemia 04/02/2015   Benign essential hypertension 04/02/2015   Mitral insufficiency 02/20/2014    Past Surgical History:  Procedure Laterality Date   BREAST BIOPSY Right 05/02/2018   Invasive mammary carcinoma, grade 3, neo adj chemo   BREAST LUMPECTOMY Right 10/29/2018   NO RESIDUAL MALIGNANCY IDENTIFIED.    BREAST LUMPECTOMY WITH NEEDLE LOCALIZATION AND AXILLARY SENTINEL LYMPH NODE BX Right 10/29/2018   Procedure: BREAST LUMPECTOMY WITH NEEDLE LOCALIZATION AND SENTINEL LYMPH NODE BX;  Surgeon: Ancil Linsey, MD;  Location: [facility] ORS;  Service: General;  Laterality: Right;   CATARACT EXTRACTION W/ INTRAOCULAR LENS  IMPLANT, BILATERAL Bilateral 2024   HYSTEROSCOPY WITH D & C N/A 11/27/2023   Procedure: D&C, IUD PLACEMENT;  Surgeon: Linzie Collin, MD;  Location: [facility] ORS;  Service: Gynecology;  Laterality: N/A;   INTRAUTERINE DEVICE (IUD) INSERTION N/A 11/27/2023   Procedure: INTRAUTERINE DEVICE (IUD) PLACEMENT;  Surgeon: Linzie Collin, MD;  Location: [facility] ORS;  Service: Gynecology;  Laterality: N/A;   PORT-A-CATH REMOVAL  2022   PORTACATH PLACEMENT Right 05/11/2018   Procedure: INSERTION  I.J PORT-A-CATH;  Surgeon: Earline Mayotte, MD;  Location: [facility] ORS;  Service: General;  Laterality: Right;   REPLACEMENT TOTAL KNEE Left 2010    OB History     Gravida  4   Para      Term      Preterm      AB  1   Living  3      SAB  1   IAB      Ectopic      Multiple      Live Births  3        Obstetric Comments  1st Menstrual Cycle:  12 1st Pregnancy: 15           Home Medications    Prior to Admission medications    Medication Sig Start Date End Date Taking? Authorizing Provider  cephALEXin (KEFLEX) 500 MG capsule Take 1 capsule (500 mg total) by mouth 3 (three) times daily for 5 days. 12/31/23 01/05/24 Yes Alleigh Mollica, Elita Boone, NP  allopurinol (ZYLOPRIM) 300 MG tablet Take 1 tablet (300 mg total) by mouth daily. 09/05/23   Johnson, Megan P, DO  amLODipine (NORVASC) 2.5 MG tablet Take 0.5 tablets (1.25 mg total) by mouth daily. 12/25/23   Johnson, Megan P, DO  apixaban (ELIQUIS) 5 MG TABS tablet Take 5 mg by mouth in the morning and at bedtime. 07/27/20   [provider]  EPINEPHrine 0.3 mg/0.3 mL IJ SOAJ injection Inject 0.3 mg into the muscle as needed for anaphylaxis. 09/05/23   Johnson, Megan P, DO  flecainide (TAMBOCOR) 100 MG tablet Take 100 mg by mouth 2 (two) times daily. 07/28/20   [provider]  levothyroxine (SYNTHROID) 200 MCG tablet Take 1 tablet (200 mcg total) by mouth daily before breakfast. 09/11/23   Johnson, Megan P, DO  losartan-hydrochlorothiazide (HYZAAR) 100-25 MG tablet Take 1 tablet by mouth daily. 12/25/23   Olevia Perches P, DO  metoprolol succinate (TOPROL-XL) 50 MG 24 hr tablet Take with or immediately following a meal. Patient taking differently: Take 50 mg by mouth daily. Take with or immediately following a meal. 09/05/23   Dorcas Carrow, DO    Family History Family History  Problem Relation Age of Onset   Diabetes Mother    Congestive Heart Failure Mother    Breast cancer Maternal Grandmother        dx 64s; deceased 27s   Cancer Maternal Uncle 17       deceased 40s   Other Father        No information on father or paternal relatives   Breast cancer Other 76       paternal half-sister; also esophageal ca at 35; currently 57   Diabetes Other    Hypertension Other    Kidney disease Other    Thyroid disease Other     Social History Social History   Tobacco Use   Smoking status: Never   Smokeless tobacco: Never  Vaping Use   Vaping status: Never  Used  Substance Use Topics   Alcohol use: No   Drug use: No     Allergies   Gabapentin, Morphine and codeine, Peanut-containing drug products, and Tape   Review of Systems Review of Systems   Physical Exam Triage Vital Signs ED Triage Vitals  Encounter Vitals Group     BP 12/31/23 0855 (!) 145/89     Systolic BP Percentile --      Diastolic BP Percentile --      Pulse Rate 12/31/23 0855 99  Resp 12/31/23 0855 18     Temp 12/31/23 0855 98.5 F (36.9 C)     Temp Source 12/31/23 0855 Oral     SpO2 12/31/23 0855 94 %     Weight --      Height --      Head Circumference --      Peak Flow --      Pain Score 12/31/23 0858 4     Pain Loc --      Pain Education --      Exclude from Growth Chart --    No data found.  Updated Vital Signs BP 124/71 (BP Location: Left Arm)   Pulse 71   Temp 98.7 F (37.1 C) (Oral)   Resp 18   LMP 03/17/2009 (Approximate)   SpO2 93%   Visual Acuity Right Eye Distance:   Left Eye Distance:   Bilateral Distance:    Right Eye Near:   Left Eye Near:    Bilateral Near:     Physical Exam Constitutional:      Appearance: Normal appearance.  Eyes:     Extraocular Movements: Extraocular movements intact.  Pulmonary:     Effort: Pulmonary effort is normal.  Abdominal:     Tenderness: There is no abdominal tenderness. There is no right CVA tenderness, left CVA tenderness or guarding.  Neurological:     Mental Status: She is alert and oriented to person, place, and time.      UC Treatments / Results  Labs (all labs ordered are listed, but only abnormal results are displayed) Labs Reviewed  POCT URINALYSIS DIP (MANUAL ENTRY) - Abnormal; Notable for the following components:      Result Value   Color, UA other (*)    Clarity, UA turbid (*)    Blood, UA large (*)    Protein Ur, POC >=300 (*)    Leukocytes, UA Small (1+) (*)    All other components within normal limits  URINE CULTURE    EKG   Radiology No results  found.  Procedures Procedures (including critical care time)  Medications Ordered in UC Medications - No data to display  Initial Impression / Assessment and Plan / UC Course  I have reviewed the triage vital signs and the nursing notes.  Pertinent labs & imaging results that were available during my care of the patient were reviewed by me and considered in my medical decision making (see chart for details).  Suprapubic pain, dysuria  Urinalysis showing leukocytes, negative for nitrates, sent for culture, discussed, prophylactically placed on cephalexin and recommended additional supportive care with follow-up with the urgent care as needed Final Clinical Impressions(s) / UC Diagnoses   Final diagnoses:  Suprapubic pain  Dysuria     Discharge Instructions      Your urinalysis not show bacteria, your urine will be sent to the lab to determine exactly which bacteria is present, if any changes need to be made to your medications you will be notified  Begin use of cephalexin every 8 hours for 5 days  You may use over-the-counter Azo to help minimize your symptoms until antibiotic removes bacteria, this medication will turn your urine orange  Increase your fluid intake through use of water  As always practice good hygiene, wiping front to back and avoidance of scented vaginal products to prevent further irritation  If symptoms continue to persist after use of medication or recur please follow-up with urgent care or your primary doctor as needed  ED Prescriptions     Medication Sig Dispense Auth. Provider   cephALEXin (KEFLEX) 500 MG capsule Take 1 capsule (500 mg total) by mouth 3 (three) times daily for 5 days. 15 capsule Nikita Surman, Elita Boone, NP      PDMP not reviewed this encounter.   Valinda Hoar, [place] 12/31/23 [phone]

## 2023-12-31 NOTE — Discharge Instructions (Signed)
 Your urinalysis not show bacteria, your urine will be sent to the lab to determine exactly which bacteria is present, if any changes need to be made to your medications you will be notified  Begin use of cephalexin every 8 hours for 5 days  You may use over-the-counter Azo to help minimize your symptoms until antibiotic removes bacteria, this medication will turn your urine orange  Increase your fluid intake through use of water  As always practice good hygiene, wiping front to back and avoidance of scented vaginal products to prevent further irritation  If symptoms continue to persist after use of medication or recur please follow-up with urgent care or your primary doctor as needed

## 2024-01-02 ENCOUNTER — Ambulatory Visit: Admitting: Nurse Practitioner

## 2024-01-02 LAB — URINE CULTURE: Culture: 30000 — AB

## 2024-03-05 ENCOUNTER — Ambulatory Visit: Admitting: Family Medicine

## 2024-03-11 LAB — COLOGUARD: COLOGUARD: NEGATIVE

## 2024-03-12 ENCOUNTER — Ambulatory Visit: Payer: Self-pay | Admitting: Family Medicine

## 2024-03-21 ENCOUNTER — Other Ambulatory Visit: Payer: Self-pay | Admitting: Family Medicine

## 2024-03-21 DIAGNOSIS — I10 Essential (primary) hypertension: Secondary | ICD-10-CM

## 2024-03-22 NOTE — Telephone Encounter (Signed)
 Requested Prescriptions  Pending Prescriptions Disp Refills   losartan -hydrochlorothiazide  (HYZAAR) 100-25 MG tablet [Pharmacy Med Name: Losartan  Potassium-HCTZ 100-25 MG Oral Tablet] 90 tablet 0    Sig: Take 1 tablet by mouth once daily     Cardiovascular: ARB + Diuretic Combos Failed - 03/22/2024  4:56 PM      Failed - Cr in normal range and within 180 days    Creatinine  Date Value Ref Range Status  11/15/2023 0.87 0.44 - 1.00 mg/dL Final   Creatinine, Ser  Date Value Ref Range Status  12/25/2023 1.03 (H) 0.57 - 1.00 mg/dL Final         Failed - Valid encounter within last 6 months    Recent Outpatient Visits           2 months ago Benign essential hypertension   [place] [facility] [place], Megan P, DO              Passed - K in normal range and within 180 days    Potassium  Date Value Ref Range Status  12/25/2023 3.7 3.5 - 5.2 mmol/L Final         Passed - Na in normal range and within 180 days    Sodium  Date Value Ref Range Status  12/25/2023 140 134 - 144 mmol/L Final         Passed - eGFR is 10 or above and within 180 days    GFR calc Af Amer  Date Value Ref Range Status  06/16/2020 84 >59 mL/min/1.73 Final    Comment:    **[facility] currently reports eGFR in compliance with the current**   recommendations of the [employer]. [facility] will   update reporting as new guidelines are published from the NKF-ASN   Task force.    GFR, Estimated  Date Value Ref Range Status  11/15/2023 >60 >60 mL/min Final    Comment:    (NOTE) Calculated using the CKD-EPI Creatinine Equation (2021)    eGFR  Date Value Ref Range Status  12/25/2023 59 (L) >59 mL/min/1.73 Final         Passed - Patient is not pregnant      Passed - Last BP in normal range    BP Readings from Last 1 Encounters:  12/31/23 124/71          metoprolol  succinate (TOPROL -XL) 50 MG 24 hr tablet [Pharmacy Med Name: Metoprolol  Succinate ER 50 MG Oral Tablet  Extended Release 24 Hour] 90 tablet 0    Sig: TAKE WITH OR IMMEDIATELY FOLLOWING A MEAL     Cardiovascular:  Beta Blockers Failed - 03/22/2024  4:56 PM      Failed - Valid encounter within last 6 months    Recent Outpatient Visits           2 months ago Benign essential hypertension   [place] [facility] [place], Megan P, DO              Passed - Last BP in normal range    BP Readings from Last 1 Encounters:  12/31/23 124/71         Passed - Last Heart Rate in normal range    Pulse Readings from Last 1 Encounters:  12/31/23 71

## 2024-04-18 ENCOUNTER — Ambulatory Visit (INDEPENDENT_AMBULATORY_CARE_PROVIDER_SITE_OTHER): Admitting: Family Medicine

## 2024-04-18 ENCOUNTER — Encounter: Payer: Self-pay | Admitting: Family Medicine

## 2024-04-18 VITALS — BP 142/72 | HR 58 | Temp 97.9°F | Ht 62.0 in | Wt 311.2 lb

## 2024-04-18 DIAGNOSIS — I10 Essential (primary) hypertension: Secondary | ICD-10-CM

## 2024-04-18 DIAGNOSIS — E782 Mixed hyperlipidemia: Secondary | ICD-10-CM | POA: Diagnosis not present

## 2024-04-18 DIAGNOSIS — M109 Gout, unspecified: Secondary | ICD-10-CM | POA: Diagnosis not present

## 2024-04-18 DIAGNOSIS — E039 Hypothyroidism, unspecified: Secondary | ICD-10-CM | POA: Diagnosis not present

## 2024-04-18 LAB — BAYER DCA HB A1C WAIVED: HB A1C (BAYER DCA - WAIVED): 5.7 % — ABNORMAL HIGH (ref 4.8–5.6)

## 2024-04-18 NOTE — Assessment & Plan Note (Signed)
Encouraged diet and exercise with goal of losing 1-2lbs per week.  

## 2024-04-18 NOTE — Assessment & Plan Note (Signed)
 Under good control on current regimen. Continue current regimen. Continue to monitor. Call with any concerns. Refills given. Labs drawn today.

## 2024-04-18 NOTE — Progress Notes (Signed)
 BP (!) 142/72   Pulse (!) 58   Temp 97.9 F (36.6 C) (Oral)   Ht 5' 2 (1.575 m)   Wt (!) 311 lb 3.2 oz (141.2 kg)   LMP 03/17/2009 (Approximate)   SpO2 96%   BMI 56.92 kg/m    Subjective:    Patient ID: Wendy Marsh, female    DOB: 1956-07-06, 68 y.o.   MRN: [id number]  HPI: Wendy Marsh is a 68 y.o. female  Chief Complaint  Patient presents with   Hypertension   Hypothyroidism   HYPERTENSION  Hypertension status: uncontrolled  Satisfied with current treatment? no Duration of hypertension: chronic BP monitoring frequency:  rarely BP medication side effects:  no Medication compliance: excellent compliance Previous BP meds: losartan -hydrochlorothiazide , metoprolol  Aspirin: no Recurrent headaches: no Visual changes: no Palpitations: no Dyspnea: no Chest pain: no Lower extremity edema: no Dizzy/lightheaded: no  No gout flares. Tolerating her medicine well. No concerns.   HYPOTHYROIDISM Thyroid  control status:controlled Satisfied with current treatment? no Medication side effects: no Medication compliance: fair compliance Recent dose adjustment:no Fatigue: yes Cold intolerance: no Heat intolerance: no Weight gain: no Weight loss: no Constipation: no Diarrhea/loose stools: no Palpitations: no Lower extremity edema: no Anxiety/depressed mood: yes  Relevant past medical, surgical, family and social history reviewed and updated as indicated. Interim medical history since our last visit reviewed. Allergies and medications reviewed and updated.  Review of Systems  Constitutional: Negative.   Respiratory: Negative.    Musculoskeletal: Negative.   Neurological: Negative.   Psychiatric/Behavioral: Negative.      Per HPI unless specifically indicated above     Objective:    BP (!) 142/72   Pulse (!) 58   Temp 97.9 F (36.6 C) (Oral)   Ht 5' 2 (1.575 m)   Wt (!) 311 lb 3.2 oz (141.2 kg)   LMP 03/17/2009 (Approximate)   SpO2 96%   BMI  56.92 kg/m   Wt Readings from Last 3 Encounters:  04/18/24 (!) 311 lb 3.2 oz (141.2 kg)  12/25/23 (!) 304 lb 6.4 oz (138.1 kg)  12/12/23 (!) 308 lb 1.6 oz (139.8 kg)    Physical Exam Vitals and nursing note reviewed.  Constitutional:      General: She is not in acute distress.    Appearance: Normal appearance. She is not ill-appearing, toxic-appearing or diaphoretic.  HENT:     Head: Normocephalic and atraumatic.     Right Ear: External ear normal.     Left Ear: External ear normal.     Nose: Nose normal.     Mouth/Throat:     Mouth: Mucous membranes are moist.     Pharynx: Oropharynx is clear.  Eyes:     General: No scleral icterus.       Right eye: No discharge.        Left eye: No discharge.     Extraocular Movements: Extraocular movements intact.     Conjunctiva/sclera: Conjunctivae normal.     Pupils: Pupils are equal, round, and reactive to light.  Cardiovascular:     Rate and Rhythm: Normal rate and regular rhythm.     Pulses: Normal pulses.     Heart sounds: Normal heart sounds. No murmur heard.    No friction rub. No gallop.  Pulmonary:     Effort: Pulmonary effort is normal. No respiratory distress.     Breath sounds: Normal breath sounds. No stridor. No wheezing, rhonchi or rales.  Chest:     Chest wall:  No tenderness.  Musculoskeletal:        General: Normal range of motion.     Cervical back: Normal range of motion and neck supple.  Skin:    General: Skin is warm and dry.     Capillary Refill: Capillary refill takes less than 2 seconds.     Coloration: Skin is not jaundiced or pale.     Findings: No bruising, erythema, lesion or rash.  Neurological:     General: No focal deficit present.     Mental Status: She is alert and oriented to person, place, and time. Mental status is at baseline.  Psychiatric:        Mood and Affect: Mood normal.        Behavior: Behavior normal.        Thought Content: Thought content normal.        Judgment: Judgment normal.      Results for orders placed or performed during the hospital encounter of 12/31/23  Urine Culture   Collection Time: 12/31/23  9:00 AM   Specimen: Urine, Clean Catch  Result Value Ref Range   Specimen Description URINE, CLEAN CATCH    Special Requests NONE    Culture 30,000 COLONIES/mL ESCHERICHIA COLI (A)    Report Status 01/02/2024 FINAL    Organism ID, Bacteria ESCHERICHIA COLI (A)       Susceptibility   Escherichia coli - MIC*    AMPICILLIN >=32 RESISTANT Resistant     CEFAZOLIN  <=4 SENSITIVE Sensitive     CEFEPIME <=0.12 SENSITIVE Sensitive     CEFTRIAXONE <=0.25 SENSITIVE Sensitive     CIPROFLOXACIN <=0.25 SENSITIVE Sensitive     GENTAMICIN <=1 SENSITIVE Sensitive     IMIPENEM <=0.25 SENSITIVE Sensitive     NITROFURANTOIN <=16 SENSITIVE Sensitive     TRIMETH/SULFA <=20 SENSITIVE Sensitive     AMPICILLIN/SULBACTAM 16 INTERMEDIATE Intermediate     PIP/TAZO <=4 SENSITIVE Sensitive ug/mL    * 30,000 COLONIES/mL ESCHERICHIA COLI  POCT urinalysis dipstick   Collection Time: 12/31/23  9:00 AM  Result Value Ref Range   Color, UA other (A)    Clarity, UA turbid (A)    Glucose, UA negative mg/dL   Bilirubin, UA negative    Ketones, POC UA negative mg/dL   Spec Grav, UA 8.984    Blood, UA large (A)    pH, UA 7.0    Protein Ur, POC >=300 (A) mg/dL   Urobilinogen, UA 1.0 E.U./dL   Nitrite, UA Negative    Leukocytes, UA Small (1+) (A)       Assessment & Plan:   Problem List Items Addressed This Visit       Cardiovascular and Mediastinum   Benign essential hypertension - Primary   Running a little high. Will work on [employer] and recheck in 6 weeks. Call with any concerns.       Relevant Orders   Bayer DCA Hb A1c Waived   CBC with Differential/Platelet   Lipid Panel w/o Chol/HDL Ratio   Comprehensive metabolic panel with GFR     Endocrine   Thyroid  activity decreased   Has missed a bunch of pills. Will check labs and adjust medicine as needed. Follow up 6  weeks.       Relevant Orders   Bayer DCA Hb A1c Waived   Lipid Panel w/o Chol/HDL Ratio   TSH     Musculoskeletal and Integument   Acute gout involving toe of right foot   Under good control  on current regimen. Continue current regimen. Continue to monitor. Call with any concerns. Refills given. Labs drawn today.      Relevant Orders   Bayer DCA Hb A1c Waived   Lipid Panel w/o Chol/HDL Ratio   Uric acid     Other   Combined fat and carbohydrate induced hyperlipemia   Under good control on current regimen. Continue current regimen. Continue to monitor. Call with any concerns. Refills given. Labs drawn today.       Relevant Orders   Lipid Panel w/o Chol/HDL Ratio   Morbid obesity (HCC)   Encouraged diet and exercise with goal of losing 1-2lbs per week.         Follow up plan: Return in about 6 weeks (around 05/30/2024).

## 2024-04-18 NOTE — Assessment & Plan Note (Signed)
 Has missed a bunch of pills. Will check labs and adjust medicine as needed. Follow up 6 weeks.

## 2024-04-18 NOTE — Assessment & Plan Note (Signed)
 Running a little high. Will work on [employer] and recheck in 6 weeks. Call with any concerns.

## 2024-04-19 LAB — COMPREHENSIVE METABOLIC PANEL WITH GFR
ALT: 17 IU/L (ref 0–32)
AST: 15 IU/L (ref 0–40)
Albumin: 4 g/dL (ref 3.9–4.9)
Alkaline Phosphatase: 109 IU/L (ref 44–121)
BUN/Creatinine Ratio: 15 (ref 12–28)
BUN: 13 mg/dL (ref 8–27)
Bilirubin Total: 0.3 mg/dL (ref 0.0–1.2)
CO2: 26 mmol/L (ref 20–29)
Calcium: 9.4 mg/dL (ref 8.7–10.3)
Chloride: 98 mmol/L (ref 96–106)
Creatinine, Ser: 0.86 mg/dL (ref 0.57–1.00)
Globulin, Total: 2.9 g/dL (ref 1.5–4.5)
Glucose: 102 mg/dL — ABNORMAL HIGH (ref 70–99)
Potassium: 3.6 mmol/L (ref 3.5–5.2)
Sodium: 139 mmol/L (ref 134–144)
Total Protein: 6.9 g/dL (ref 6.0–8.5)
eGFR: 73 mL/min/1.73 (ref >59)

## 2024-04-19 LAB — CBC WITH DIFFERENTIAL/PLATELET
Basophils Absolute: 0 x10E3/uL (ref 0.0–0.2)
Basos: 0 %
EOS (ABSOLUTE): 0.4 x10E3/uL (ref 0.0–0.4)
Eos: 4 %
Hematocrit: 36.4 % (ref 34.0–46.6)
Hemoglobin: 11.3 g/dL (ref 11.1–15.9)
Immature Grans (Abs): 0.1 x10E3/uL (ref 0.0–0.1)
Immature Granulocytes: 1 %
Lymphocytes Absolute: 1.5 x10E3/uL (ref 0.7–3.1)
Lymphs: 15 %
MCH: 27.2 pg (ref 26.6–33.0)
MCHC: 31 g/dL — ABNORMAL LOW (ref 31.5–35.7)
MCV: 88 fL (ref 79–97)
Monocytes Absolute: 0.6 x10E3/uL (ref 0.1–0.9)
Monocytes: 6 %
Neutrophils Absolute: 7.1 x10E3/uL — ABNORMAL HIGH (ref 1.4–7.0)
Neutrophils: 74 %
Platelets: 335 x10E3/uL (ref 150–450)
RBC: 4.15 x10E6/uL (ref 3.77–5.28)
RDW: 14.9 % (ref 11.7–15.4)
WBC: 9.7 x10E3/uL (ref 3.4–10.8)

## 2024-04-19 LAB — TSH: TSH: 5.67 u[IU]/mL — ABNORMAL HIGH (ref 0.450–4.500)

## 2024-04-19 LAB — LIPID PANEL W/O CHOL/HDL RATIO
Cholesterol, Total: 209 mg/dL — ABNORMAL HIGH (ref 100–199)
HDL: 52 mg/dL (ref >39)
LDL Chol Calc (NIH): 134 mg/dL — ABNORMAL HIGH (ref 0–99)
Triglycerides: 127 mg/dL (ref 0–149)
VLDL Cholesterol Cal: 23 mg/dL (ref 5–40)

## 2024-04-19 LAB — URIC ACID: Uric Acid: 5.1 mg/dL (ref 3.0–7.2)

## 2024-04-24 ENCOUNTER — Ambulatory Visit: Payer: Self-pay | Admitting: Family Medicine

## 2024-04-29 ENCOUNTER — Other Ambulatory Visit: Payer: Self-pay | Admitting: Family Medicine

## 2024-04-30 NOTE — Telephone Encounter (Signed)
 Requested Prescriptions  Pending Prescriptions Disp Refills   allopurinol  (ZYLOPRIM ) 300 MG tablet [Pharmacy Med Name: Allopurinol  300 MG Oral Tablet] 90 tablet 0    Sig: Take 1 tablet by mouth once daily     Endocrinology:  Gout Agents - allopurinol  Failed - 04/30/2024  3:03 PM      Failed - CBC within normal limits and completed in the last 12 months    WBC  Date Value Ref Range Status  04/18/2024 9.7 3.4 - 10.8 x10E3/uL Final  05/15/2023 9.7 4.0 - 10.5 K/uL Final   WBC Count  Date Value Ref Range Status  11/15/2023 10.9 (H) 4.0 - 10.5 K/uL Final   RBC  Date Value Ref Range Status  04/18/2024 4.15 3.77 - 5.28 x10E6/uL Final  11/15/2023 4.46 3.87 - 5.11 MIL/uL Final   Hemoglobin  Date Value Ref Range Status  04/18/2024 11.3 11.1 - 15.9 g/dL Final   Hematocrit  Date Value Ref Range Status  04/18/2024 36.4 34.0 - 46.6 % Final   MCHC  Date Value Ref Range Status  04/18/2024 31.0 (L) 31.5 - 35.7 g/dL Final  97/87/7974 69.0 30.0 - 36.0 g/dL Final   [facility]  Date Value Ref Range Status  04/18/2024 27.2 26.6 - 33.0 pg Final  11/15/2023 26.9 26.0 - 34.0 pg Final   MCV  Date Value Ref Range Status  04/18/2024 88 79 - 97 fL Final   No results found for: PLTCOUNTKUC, LABPLAT, POCPLA RDW  Date Value Ref Range Status  04/18/2024 14.9 11.7 - 15.4 % Final         Passed - Uric Acid in normal range and within 360 days    Uric Acid  Date Value Ref Range Status  04/18/2024 5.1 3.0 - 7.2 mg/dL Final    Comment:               Therapeutic target for gout patients: <6.0         Passed - Cr in normal range and within 360 days    Creatinine  Date Value Ref Range Status  11/15/2023 0.87 0.44 - 1.00 mg/dL Final   Creatinine, Ser  Date Value Ref Range Status  04/18/2024 0.86 0.57 - 1.00 mg/dL Final         Passed - Valid encounter within last 12 months    Recent Outpatient Visits           1 week ago Benign essential hypertension   [place] [facility]  [place], Megan P, DO   4 months ago Benign essential hypertension   [place] [facility] [place], [place], DO

## 2024-06-10 ENCOUNTER — Ambulatory Visit (INDEPENDENT_AMBULATORY_CARE_PROVIDER_SITE_OTHER): Admitting: Family Medicine

## 2024-06-10 ENCOUNTER — Encounter: Payer: Self-pay | Admitting: Family Medicine

## 2024-06-10 VITALS — BP 142/68 | HR 62 | Temp 97.6°F | Ht 62.0 in | Wt 304.2 lb

## 2024-06-10 DIAGNOSIS — Z23 Encounter for immunization: Secondary | ICD-10-CM

## 2024-06-10 DIAGNOSIS — I10 Essential (primary) hypertension: Secondary | ICD-10-CM

## 2024-06-10 DIAGNOSIS — E039 Hypothyroidism, unspecified: Secondary | ICD-10-CM | POA: Diagnosis not present

## 2024-06-10 MED ORDER — AMLODIPINE BESYLATE 2.5 MG PO TABS: 1.2500 mg | ORAL_TABLET | Freq: Every day | ORAL | 0 refills | Status: DC

## 2024-06-10 MED ORDER — ALLOPURINOL 300 MG PO TABS: 300.0000 mg | ORAL_TABLET | Freq: Every day | ORAL | 0 refills | Status: DC

## 2024-06-10 MED ORDER — METOPROLOL SUCCINATE ER 50 MG PO TB24: ORAL_TABLET | ORAL | 0 refills | Status: DC

## 2024-06-10 MED ORDER — LOSARTAN POTASSIUM-HCTZ 100-25 MG PO TABS: 1.0000 | ORAL_TABLET | Freq: Every day | ORAL | 0 refills | Status: DC

## 2024-06-10 NOTE — Assessment & Plan Note (Signed)
 Rechecking labs today. Await results. Treat as needed.

## 2024-06-10 NOTE — Assessment & Plan Note (Signed)
 Still running high. Will start amlodipine  1.25mg  and recheck at physical in 3 months. Call with any concerns.

## 2024-06-10 NOTE — Progress Notes (Signed)
 BP (!) 142/68   Pulse 62   Temp 97.6 F (36.4 C)   Ht 5' 2 (1.575 m)   Wt (!) 304 lb 3.2 oz (138 kg)   LMP 03/17/2009 (Approximate)   SpO2 94%   BMI 55.64 kg/m    Subjective:    Patient ID: Wendy Marsh, female    DOB: 1956/08/03, 68 y.o.   MRN: [id number]  HPI: Wendy Marsh is a 68 y.o. female  Chief Complaint  Patient presents with   Hypertension    Pt presents today for a bp check and thyroid  check. No additional questions or concerns.   HYPERTENSION  Hypertension status: uncontrolled  Satisfied with current treatment? no Duration of hypertension: chronic BP monitoring frequency:  rarely BP medication side effects:  no Medication compliance: excellent compliance Previous BP meds:losartan , hydrochlorothiazide , metoprolol  Aspirin: no Recurrent headaches: no Visual changes: no Palpitations: no Dyspnea: no Chest pain: no Lower extremity edema: no Dizzy/lightheaded: no  HYPOTHYROIDISM Thyroid  control status:better Satisfied with current treatment? yes Medication side effects: no Medication compliance: excellent compliance Recent dose adjustment:no Fatigue: no Cold intolerance: no Heat intolerance: no Weight gain: no Weight loss: no Constipation: no Diarrhea/loose stools: no Palpitations: no Lower extremity edema: no Anxiety/depressed mood: no  Chair broke under her about 5-6 weeks ago and hit her head. Feeling better now. No dizziness or other symptoms.   Had head lice over the summer, had to use some the remedies and it caused some rashes on her head. She notes that she was having some discomfort on the L side of the back of her head. Significantly better now than it was a week ago.   Relevant past medical, surgical, family and social history reviewed and updated as indicated. Interim medical history since our last visit reviewed. Allergies and medications reviewed and updated.  Review of Systems  Constitutional: Negative.   Respiratory:  Negative.    Cardiovascular: Negative.   Musculoskeletal: Negative.   Skin: Negative.   Neurological: Negative.   Psychiatric/Behavioral: Negative.      Per HPI unless specifically indicated above     Objective:    BP (!) 142/68   Pulse 62   Temp 97.6 F (36.4 C)   Ht 5' 2 (1.575 m)   Wt (!) 304 lb 3.2 oz (138 kg)   LMP 03/17/2009 (Approximate)   SpO2 94%   BMI 55.64 kg/m   Wt Readings from Last 3 Encounters:  06/10/24 (!) 304 lb 3.2 oz (138 kg)  04/18/24 (!) 311 lb 3.2 oz (141.2 kg)  12/25/23 (!) 304 lb 6.4 oz (138.1 kg)    Physical Exam Vitals and nursing note reviewed.  Constitutional:      General: She is not in acute distress.    Appearance: Normal appearance. She is obese. She is not ill-appearing, toxic-appearing or diaphoretic.  HENT:     Head: Normocephalic and atraumatic.     Right Ear: External ear normal.     Left Ear: External ear normal.     Nose: Nose normal.     Mouth/Throat:     Mouth: Mucous membranes are moist.     Pharynx: Oropharynx is clear.  Eyes:     General: No scleral icterus.       Right eye: No discharge.        Left eye: No discharge.     Extraocular Movements: Extraocular movements intact.     Conjunctiva/sclera: Conjunctivae normal.     Pupils: Pupils are equal, round, and  reactive to light.  Cardiovascular:     Rate and Rhythm: Normal rate and regular rhythm.     Pulses: Normal pulses.     Heart sounds: Normal heart sounds. No murmur heard.    No friction rub. No gallop.  Pulmonary:     Effort: Pulmonary effort is normal. No respiratory distress.     Breath sounds: Normal breath sounds. No stridor. No wheezing, rhonchi or rales.  Chest:     Chest wall: No tenderness.  Musculoskeletal:        General: Normal range of motion.     Cervical back: Normal range of motion and neck supple.  Skin:    General: Skin is warm and dry.     Capillary Refill: Capillary refill takes less than 2 seconds.     Coloration: Skin is not  jaundiced or pale.     Findings: No bruising, erythema, lesion or rash.  Neurological:     General: No focal deficit present.     Mental Status: She is alert and oriented to person, place, and time. Mental status is at baseline.  Psychiatric:        Mood and Affect: Mood normal.        Behavior: Behavior normal.        Thought Content: Thought content normal.        Judgment: Judgment normal.     Results for orders placed or performed in visit on 04/18/24  Bayer DCA Hb A1c Waived   Collection Time: 04/18/24  2:44 PM  Result Value Ref Range   HB A1C (BAYER DCA - WAIVED) 5.7 (H) 4.8 - 5.6 %  CBC with Differential/Platelet   Collection Time: 04/18/24  2:46 PM  Result Value Ref Range   WBC 9.7 3.4 - 10.8 x10E3/uL   RBC 4.15 3.77 - 5.28 x10E6/uL   Hemoglobin 11.3 11.1 - 15.9 g/dL   Hematocrit 63.5 65.9 - 46.6 %   MCV 88 79 - 97 fL   MCH 27.2 26.6 - 33.0 pg   MCHC 31.0 (L) 31.5 - 35.7 g/dL   RDW 85.0 88.2 - 84.5 %   Platelets 335 150 - 450 x10E3/uL   Neutrophils 74 Not Estab. %   Lymphs 15 Not Estab. %   Monocytes 6 Not Estab. %   Eos 4 Not Estab. %   Basos 0 Not Estab. %   Neutrophils Absolute 7.1 (H) 1.4 - 7.0 x10E3/uL   Lymphocytes Absolute 1.5 0.7 - 3.1 x10E3/uL   Monocytes Absolute 0.6 0.1 - 0.9 x10E3/uL   EOS (ABSOLUTE) 0.4 0.0 - 0.4 x10E3/uL   Basophils Absolute 0.0 0.0 - 0.2 x10E3/uL   Immature Granulocytes 1 Not Estab. %   Immature Grans (Abs) 0.1 0.0 - 0.1 x10E3/uL  Lipid Panel w/o Chol/HDL Ratio   Collection Time: 04/18/24  2:46 PM  Result Value Ref Range   Cholesterol, Total 209 (H) 100 - 199 mg/dL   Triglycerides 872 0 - 149 mg/dL   HDL 52 >60 mg/dL   VLDL Cholesterol Cal 23 5 - 40 mg/dL   LDL Chol Calc (NIH) 865 (H) 0 - 99 mg/dL  Comprehensive metabolic panel with GFR   Collection Time: 04/18/24  2:46 PM  Result Value Ref Range   Glucose 102 (H) 70 - 99 mg/dL   BUN 13 8 - 27 mg/dL   Creatinine, Ser 9.13 0.57 - 1.00 mg/dL   eGFR 73 >40 fO/fpw/8.26    BUN/Creatinine Ratio 15 12 - 28   Sodium  139 134 - 144 mmol/L   Potassium 3.6 3.5 - 5.2 mmol/L   Chloride 98 96 - 106 mmol/L   CO2 26 20 - 29 mmol/L   Calcium 9.4 8.7 - 10.3 mg/dL   Total Protein 6.9 6.0 - 8.5 g/dL   Albumin 4.0 3.9 - 4.9 g/dL   Globulin, Total 2.9 1.5 - 4.5 g/dL   Bilirubin Total 0.3 0.0 - 1.2 mg/dL   Alkaline Phosphatase 109 44 - 121 IU/L   AST 15 0 - 40 IU/L   ALT 17 0 - 32 IU/L  TSH   Collection Time: 04/18/24  2:46 PM  Result Value Ref Range   TSH 5.670 (H) 0.450 - 4.500 uIU/mL  Uric acid   Collection Time: 04/18/24  2:46 PM  Result Value Ref Range   Uric Acid 5.1 3.0 - 7.2 mg/dL      Assessment & Plan:   Problem List Items Addressed This Visit       Cardiovascular and Mediastinum   Benign essential hypertension   Still running high. Will start amlodipine  1.25mg  and recheck at physical in 3 months. Call with any concerns.       Relevant Medications   amLODipine  (NORVASC ) 2.5 MG tablet   losartan -hydrochlorothiazide  (HYZAAR) 100-25 MG tablet   metoprolol  succinate (TOPROL -XL) 50 MG 24 hr tablet     Endocrine   Thyroid  activity decreased - Primary   Rechecking labs today. Await results. Treat as needed.       Relevant Medications   metoprolol  succinate (TOPROL -XL) 50 MG 24 hr tablet   Other Relevant Orders   TSH   Other Visit Diagnoses       Needs flu shot       Flu shot given today.   Relevant Orders   Flu vaccine HIGH DOSE PF(Fluzone Trivalent) (Completed)        Follow up plan: Return in about 3 months (around 09/09/2024) for physical.

## 2024-06-11 ENCOUNTER — Other Ambulatory Visit: Payer: Self-pay | Admitting: Family Medicine

## 2024-06-11 ENCOUNTER — Ambulatory Visit: Payer: Self-pay | Admitting: Family Medicine

## 2024-06-11 DIAGNOSIS — E039 Hypothyroidism, unspecified: Secondary | ICD-10-CM

## 2024-06-11 LAB — TSH: TSH: 0.378 u[IU]/mL — ABNORMAL LOW (ref 0.450–4.500)

## 2024-06-11 MED ORDER — LEVOTHYROXINE SODIUM 100 MCG PO TABS: 100.0000 ug | ORAL_TABLET | Freq: Every day | ORAL | 0 refills | Status: DC

## 2024-06-11 MED ORDER — LEVOTHYROXINE SODIUM 88 MCG PO TABS: 88.0000 ug | ORAL_TABLET | Freq: Every day | ORAL | 0 refills | Status: DC

## 2024-06-11 NOTE — Progress Notes (Signed)
 Called patient and left a message for her to call back to get scheduled for a lab only appt in 6 weeks

## 2024-06-11 NOTE — Progress Notes (Signed)
 Scheduled

## 2024-06-13 ENCOUNTER — Ambulatory Visit
Admission: RE | Admit: 2024-06-13 | Discharge: 2024-06-13 | Disposition: A | Discharge status: Home / Self Care | Source: Ambulatory Visit | Attending: Oncology | Admitting: Oncology

## 2024-06-13 DIAGNOSIS — Z1231 Encounter for screening mammogram for malignant neoplasm of breast: Secondary | ICD-10-CM | POA: Diagnosis present

## 2024-06-13 NOTE — Telephone Encounter (Signed)
 Pharmacy comment: ALVOGEN BRAND IS ON BACK ORDER. MAY WE CHANGE TO A NEW MANUFACTURER?

## 2024-07-23 ENCOUNTER — Other Ambulatory Visit

## 2024-07-23 DIAGNOSIS — E039 Hypothyroidism, unspecified: Secondary | ICD-10-CM

## 2024-07-24 ENCOUNTER — Ambulatory Visit: Payer: Self-pay | Admitting: Family Medicine

## 2024-07-24 LAB — TSH: TSH: 1.11 u[IU]/mL (ref 0.450–4.500)

## 2024-07-24 MED ORDER — LEVOTHYROXINE SODIUM 88 MCG PO TABS: 88.0000 ug | ORAL_TABLET | Freq: Every day | ORAL | 3 refills | Status: DC

## 2024-07-24 MED ORDER — LEVOTHYROXINE SODIUM 100 MCG PO TABS: 100.0000 ug | ORAL_TABLET | Freq: Every day | ORAL | 3 refills | Status: DC

## 2024-09-18 ENCOUNTER — Other Ambulatory Visit: Payer: Self-pay | Admitting: Family Medicine

## 2024-09-18 DIAGNOSIS — I10 Essential (primary) hypertension: Secondary | ICD-10-CM

## 2024-09-20 NOTE — Telephone Encounter (Signed)
 Requested Prescriptions  Pending Prescriptions Disp Refills   losartan -hydrochlorothiazide  (HYZAAR) 100-25 MG tablet [Pharmacy Med Name: Losartan  Potassium-HCTZ 100-25 MG Oral Tablet] 90 tablet 0    Sig: Take 1 tablet by mouth once daily     Cardiovascular: ARB + Diuretic Combos Failed - 09/20/2024  3:20 PM      Failed - Last BP in normal range    BP Readings from Last 1 Encounters:  06/10/24 (!) 142/68         Passed - K in normal range and within 180 days    Potassium  Date Value Ref Range Status  04/18/2024 3.6 3.5 - 5.2 mmol/L Final         Passed - Na in normal range and within 180 days    Sodium  Date Value Ref Range Status  04/18/2024 139 134 - 144 mmol/L Final         Passed - Cr in normal range and within 180 days    Creatinine  Date Value Ref Range Status  11/15/2023 0.87 0.44 - 1.00 mg/dL Final   Creatinine, Ser  Date Value Ref Range Status  04/18/2024 0.86 0.57 - 1.00 mg/dL Final         Passed - eGFR is 10 or above and within 180 days    GFR calc Af Amer  Date Value Ref Range Status  06/16/2020 84 >59 mL/min/1.73 Final    Comment:    **[facility] currently reports eGFR in compliance with the current**   recommendations of the [employer]. [facility] will   update reporting as new guidelines are published from the NKF-ASN   Task force.    GFR, Estimated  Date Value Ref Range Status  11/15/2023 >60 >60 mL/min Final    Comment:    (NOTE) Calculated using the CKD-EPI Creatinine Equation (2021)    eGFR  Date Value Ref Range Status  04/18/2024 73 >59 mL/min/1.73 Final         Passed - Patient is not pregnant      Passed - Valid encounter within last 6 months    Recent Outpatient Visits           3 months ago Hypothyroidism, unspecified type   [place] [facility] [place], Megan P, DO   5 months ago Benign essential hypertension   [place] [facility] [place], Megan P, DO   9 months ago Benign essential  hypertension   [place] [facility] [place], [place], DO               metoprolol  succinate (TOPROL -XL) 50 MG 24 hr tablet [Pharmacy Med Name: Metoprolol  Succinate ER 50 MG Oral Tablet Extended Release 24 Hour] 90 tablet 0    Sig: TAKE 1 TABLET BY MOUTH ONCE DAILY (TAKE  WITH  OR  IMMEDIATELY  FOLLOWING  A  MEAL)     Cardiovascular:  Beta Blockers Failed - 09/20/2024  3:20 PM      Failed - Last BP in normal range    BP Readings from Last 1 Encounters:  06/10/24 (!) 142/68         Passed - Last Heart Rate in normal range    Pulse Readings from Last 1 Encounters:  06/10/24 62         Passed - Valid encounter within last 6 months    Recent Outpatient Visits           3 months ago Hypothyroidism, unspecified type  [place] [facility] [place], [place] P, DO   5 months ago Benign essential hypertension   [place] [facility] [place], [place] P, DO   9 months ago Benign essential hypertension   [place] [facility] [place], [place], [place]

## 2024-10-08 ENCOUNTER — Ambulatory Visit (INDEPENDENT_AMBULATORY_CARE_PROVIDER_SITE_OTHER): Admitting: Family Medicine

## 2024-10-08 ENCOUNTER — Encounter: Payer: Self-pay | Admitting: Family Medicine

## 2024-10-08 VITALS — BP 137/79 | HR 54 | Temp 97.8°F | Ht 62.0 in | Wt 313.0 lb

## 2024-10-08 DIAGNOSIS — E039 Hypothyroidism, unspecified: Secondary | ICD-10-CM | POA: Diagnosis not present

## 2024-10-08 DIAGNOSIS — Z0000 Encounter for general adult medical examination without abnormal findings: Secondary | ICD-10-CM

## 2024-10-08 DIAGNOSIS — I10 Essential (primary) hypertension: Secondary | ICD-10-CM

## 2024-10-08 DIAGNOSIS — Z853 Personal history of malignant neoplasm of breast: Secondary | ICD-10-CM

## 2024-10-08 DIAGNOSIS — E782 Mixed hyperlipidemia: Secondary | ICD-10-CM

## 2024-10-08 DIAGNOSIS — I480 Paroxysmal atrial fibrillation: Secondary | ICD-10-CM

## 2024-10-08 DIAGNOSIS — M109 Gout, unspecified: Secondary | ICD-10-CM

## 2024-10-08 MED ORDER — AMLODIPINE BESYLATE 2.5 MG PO TABS: 1.2500 mg | ORAL_TABLET | Freq: Every day | ORAL | 1 refills | Status: AC

## 2024-10-08 MED ORDER — ALLOPURINOL 300 MG PO TABS: 300.0000 mg | ORAL_TABLET | Freq: Every day | ORAL | 1 refills | Status: AC

## 2024-10-08 MED ORDER — LOSARTAN POTASSIUM-HCTZ 100-25 MG PO TABS: 1.0000 | ORAL_TABLET | Freq: Every day | ORAL | 1 refills | Status: AC

## 2024-10-08 MED ORDER — METOPROLOL SUCCINATE ER 50 MG PO TB24: ORAL_TABLET | ORAL | 1 refills | Status: AC

## 2024-10-08 NOTE — Progress Notes (Signed)
 "  BP 137/79 (BP Location: Left Arm, Cuff Size: Large)   Pulse (!) 54   Temp 97.8 F (36.6 C) (Oral)   Ht 5' 2 (1.575 m)   Wt (!) 313 lb (142 kg)   LMP 03/17/2009   SpO2 90%   BMI 57.25 kg/m    Subjective:    Patient ID: Wendy Marsh, female    DOB: Apr 02, 1956, 69 y.o.   MRN: [id number]  HPI: Wendy Marsh is a 69 y.o. female presenting on 10/08/2024 for comprehensive medical examination. Current medical complaints include:  HYPERTENSION / HYPERLIPIDEMIA Satisfied with current treatment? yes Duration of hypertension: chronic BP monitoring frequency: not checking BP medication side effects: no Past BP meds: amlodipine , losartan , HCTZ Duration of hyperlipidemia: chronic Cholesterol medication side effects: no Cholesterol supplements: none Past cholesterol medications: none Medication compliance: excellent compliance Aspirin: no Recent stressors: no Recurrent headaches: no Visual changes: no Palpitations: no Dyspnea: no Chest pain: no Lower extremity edema: no Dizzy/lightheaded: no  One short gout flare about 2 weeks ago after dietary indiscretion. Tolerating her medicine well.   HYPOTHYROIDISM Thyroid  control status:controlled Satisfied with current treatment? yes Medication side effects: no Medication compliance: excellent compliance Recent dose adjustment:no Fatigue: yes Cold intolerance: no Heat intolerance: no Weight gain: no Weight loss: no Constipation: no Diarrhea/loose stools: no Palpitations: no Lower extremity edema: no Anxiety/depressed mood: no   She currently lives with: husband Menopausal Symptoms: no  Functional Status Survey: Is the patient deaf or have difficulty hearing?: No Does the patient have difficulty seeing, even when wearing glasses/contacts?: No Does the patient have difficulty concentrating, remembering, or making decisions?: No Does the patient have difficulty walking or climbing stairs?: No Does the patient have  difficulty dressing or bathing?: No Does the patient have difficulty doing errands alone such as visiting a doctor's office or shopping?: No     10/08/2024   11:19 AM 10/08/2024   11:08 AM 12/25/2023   10:04 AM 09/05/2023    9:04 AM 03/06/2023    9:09 AM  Fall Risk   Falls in the past year? 0 0 0 0 0  Number falls in past yr: 0 0 0 0 0  Injury with Fall? 0 0 0  0  0   Risk for fall due to : No Fall Risks No Fall Risks No Fall Risks No Fall Risks No Fall Risks  Risk for fall due to: Comment   Uses rollator    Follow up Falls evaluation completed Falls evaluation completed Falls evaluation completed Falls evaluation completed Falls evaluation completed     Data saved with a previous flowsheet row definition    Depression Screen    10/08/2024   11:08 AM 06/10/2024    9:55 AM 04/18/2024    2:29 PM 12/25/2023   10:04 AM 09/05/2023    9:04 AM  Depression screen PHQ 2/9  Decreased Interest 0 0 0 0 0  Down, Depressed, Hopeless 0 0 0 0 0  PHQ - 2 Score 0 0 0 0 0  Altered sleeping 2 1 1  0 1  Tired, decreased energy 0 0 1 0 0  Change in appetite 1 0 1 0 0  Feeling bad or failure about yourself  0 0 0 0 0  Trouble concentrating 0 0 0 0 0  Moving slowly or fidgety/restless 0 0 0 0 0  Suicidal thoughts 0 0 0 0 0  PHQ-9 Score 3 1  3   0  1   Difficult doing  work/chores Not difficult at all Not difficult at all Not difficult at all Not difficult at all Not difficult at all     Data saved with a previous flowsheet row definition    Advanced Directives Does patient have a HCPOA?    no If yes, name and contact information:  Does patient have a living will or MOST form?  no  Past Medical History:  Past Medical History:  Diagnosis Date   Allergy 2019   Arthritis    Benign essential hypertension    Difficult intubation    DOE (dyspnea on exertion)    Endometrial polyp    Family history of adverse reaction to anesthesia    a.) PONV in 1st degree relative (sister)   Gout    History of 2019  novel coronavirus disease (COVID-19) 09/2020   Hypothyroidism    Invasive carcinoma of RIGHT breast (HCC) 04/2018   a.) pathology (+) IMC (T2, N0; G3, ER/PR -, HER2/neu -); Tx'd with neoadjuvant systemic antineoplastic chemotherapy + surgical excision (lumpectomy)   Lumbago    Mixed hyperlipidemia    Morbid obesity with BMI of 50.0-59.9, adult (HCC)    On apixaban therapy    Osteoporosis    Persistent atrial fibrillation (HCC)    a.) CHA2DS2VASc = 3 (age, sex, HTN) as of 11/24/2023; b.) rate/rhythm maintained on oral flecainide + metoprolol  succinate; chronically anticoagulated with apixaban   Personal history of chemotherapy    Personal history of radiation therapy    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    BREAST LUMPECTOMY WITH NEEDLE LOCALIZATION AND AXILLARY SENTINEL LYMPH NODE BX Right 10/29/2018   Procedure: BREAST LUMPECTOMY WITH NEEDLE LOCALIZATION AND SENTINEL LYMPH NODE BX;  Surgeon: Nicholaus Selinda Birmingham, MD;  Location: [facility];  Service: General;  Laterality: Right;   CATARACT EXTRACTION W/ INTRAOCULAR LENS  IMPLANT, BILATERAL Bilateral 2024   HYSTEROSCOPY WITH D & C N/A 11/27/2023   Procedure: D&C, IUD PLACEMENT;  Surgeon: Janit Alm Agent, MD;  Location: [facility];  Service: Gynecology;  Laterality: N/A;   INTRAUTERINE DEVICE (IUD) INSERTION N/A 11/27/2023   Procedure: INTRAUTERINE DEVICE (IUD) PLACEMENT;  Surgeon: Janit Alm Agent, MD;  Location: [facility];  Service: Gynecology;  Laterality: N/A;   JOINT REPLACEMENT  01/2009   PORT-A-CATH REMOVAL  2022   PORTACATH PLACEMENT Right 05/11/2018   Procedure: INSERTION  I.J PORT-A-CATH;  Surgeon: Dessa Reyes ORN, MD;  Location: [facility];  Service: General;  Laterality:  Right;   REPLACEMENT TOTAL KNEE Left 2010    Medications:  Current Outpatient Medications on File Prior to Visit  Medication Sig   apixaban (ELIQUIS) 5 MG TABS tablet Take 5 mg by mouth in the morning and at bedtime.   EPINEPHrine  0.3 mg/0.3 mL IJ SOAJ injection Inject 0.3 mg into the muscle as needed for anaphylaxis.   flecainide (TAMBOCOR) 100 MG tablet Take 100 mg by mouth 2 (two) times daily.   levothyroxine  (SYNTHROID ) 100 MCG tablet Take 1 tablet (100 mcg total) by mouth daily before breakfast. TAKE 1 TABLET BY MOUTH ONCE DAILY (TAKE  WITH   FOR   TOTAL   levothyroxine  (SYNTHROID ) 88 MCG tablet Take 1 tablet (88 mcg total) by mouth daily. Take with the 100mcg for  total.   No current facility-administered medications on file prior to visit.    Allergies:  Allergies[1]  Social History:  Social History   Socioeconomic History   Marital status: Married    Spouse name: Christopher   Number of children: 3   Years of education: Not on file   Highest education level: GED or equivalent  Occupational History   Not on file  Tobacco Use   Smoking status: Never   Smokeless tobacco: Never  Vaping Use   Vaping status: Never Used  Substance and Sexual Activity   Alcohol use: No   Drug use: No   Sexual activity: Not Currently    Birth control/protection: Post-menopausal  Other Topics Concern   Not on file  Social History Narrative   Not on file   Social Drivers of Health   Tobacco Use: Low Risk (10/08/2024)   Patient History    Smoking Tobacco Use: Never    Smokeless Tobacco Use: Never    Passive Exposure: Not on file  Financial Resource Strain: Low Risk (06/06/2024)   Overall Financial Resource Strain (CARDIA)    Difficulty of Paying Living Expenses: Not very hard  Food Insecurity: No Food Insecurity (10/08/2024)   ACO Reach    Worried About Running Out of Food in the Last Year: No    Ran Out of Food in the Last Year: No  Transportation Needs: No  Transportation Needs (10/08/2024)   ACO Reach    Lack of Transportation: No  Physical Activity: Inactive (10/08/2024)   Exercise Vital Sign    Days of Exercise per Week: 0 days    Minutes of Exercise per Session: 0 min  Stress: No Stress Concern Present (06/06/2024)   [employer] of Occupational Health - Occupational Stress Questionnaire    Feeling of Stress: Only a little  Social Connections: Moderately Isolated (10/08/2024)   Social Connection and Isolation Panel    Frequency of Communication with Friends and Family: Three times a week    Frequency of Social Gatherings with Friends and Family: Not on file    Attends Religious Services: Never    Active Member of Clubs or Organizations: No    Attends Banker Meetings: Never    Marital Status: Married  Catering Manager Violence: Not At Risk (10/08/2024)   Epic    Fear of Current or Ex-Partner: No    Emotionally Abused: No    Physically Abused: No    Sexually Abused: No  Recent Concern: Intimate Partner Violence - At Risk (10/08/2024)   ACO Reach    Feels Physically and Emotionally Safe: No    Physically Hurt by Someone: No    Humiliated or Emotionally Abused by Someone: No  Depression (PHQ2-9): Low Risk (10/08/2024)   Depression (PHQ2-9)    PHQ-2 Score: 3  Alcohol Screen: Low Risk (06/30/2022)   Alcohol Screen    Last Alcohol Screening Score (AUDIT): 0  Housing: At Risk (10/08/2024)   ACO Reach    Has Housing: No    Worried About Losing Housing: No    Unable to Get Utilities: No  Utilities: Not At Risk (10/08/2024)   Epic    Threatened with loss of utilities: No  Recent Concern: Utilities - At Risk (10/08/2024)   ACO Reach    Has Housing: No    Worried About Losing Housing: No    Unable to Get Utilities: No  Health Literacy: Adequate Health Literacy (10/08/2024)   B1300 Health Literacy    Frequency  of need for help with medical instructions: Never   Tobacco Use History[2] Social History   Substance and Sexual  Activity  Alcohol Use No    Family History:  Family History  Problem Relation Age of Onset   Diabetes Mother    Congestive Heart Failure Mother    Breast cancer Maternal Grandmother        dx 77s; deceased 88s   Cancer Maternal Uncle 70       deceased 86s   Other Father        No information on father or paternal relatives   Breast cancer Other 39       paternal half-sister; also esophageal ca at 81; currently 27   Diabetes Other    Hypertension Other    Kidney disease Other    Thyroid  disease Other    Cancer Sister     Past medical history, surgical history, medications, allergies, family history and social history reviewed with patient today and changes made to appropriate areas of the chart.   Review of Systems  Constitutional: Negative.   HENT: Negative.    Eyes: Negative.   Respiratory: Negative.    Cardiovascular:  Positive for palpitations. Negative for chest pain, orthopnea, claudication, leg swelling and PND.  Gastrointestinal: Negative.   Genitourinary: Negative.   Musculoskeletal: Negative.   Skin: Negative.   Neurological: Negative.   Endo/Heme/Allergies:  Positive for environmental allergies. Negative for polydipsia. Does not bruise/bleed easily.  Psychiatric/Behavioral: Negative.      All other ROS negative except what is listed above and in the HPI.      Objective:    BP 137/79 (BP Location: Left Arm, Cuff Size: Large)   Pulse (!) 54   Temp 97.8 F (36.6 C) (Oral)   Ht 5' 2 (1.575 m)   Wt (!) 313 lb (142 kg)   LMP 03/17/2009   SpO2 90%   BMI 57.25 kg/m   Wt Readings from Last 3 Encounters:  10/08/24 (!) 313 lb (142 kg)  06/10/24 (!) 304 lb 3.2 oz (138 kg)  04/18/24 (!) 311 lb 3.2 oz (141.2 kg)    No results found.  Physical Exam Vitals and nursing note reviewed.  Constitutional:      General: She is not in acute distress.    Appearance: Normal appearance. She is not ill-appearing, toxic-appearing or diaphoretic.  HENT:     Head:  Normocephalic and atraumatic.     Right Ear: Tympanic membrane, ear canal and external ear normal. There is no impacted cerumen.     Left Ear: Tympanic membrane, ear canal and external ear normal. There is no impacted cerumen.     Nose: Nose normal. No congestion or rhinorrhea.     Mouth/Throat:     Mouth: Mucous membranes are moist.     Pharynx: Oropharynx is clear. No oropharyngeal exudate or posterior oropharyngeal erythema.  Eyes:     General: No scleral icterus.       Right eye: No discharge.        Left eye: No discharge.     Extraocular Movements: Extraocular movements intact.     Conjunctiva/sclera: Conjunctivae normal.     Pupils: Pupils are equal, round, and reactive to light.  Neck:     Vascular: No carotid bruit.  Cardiovascular:     Rate and Rhythm: Normal rate and regular rhythm.     Pulses: Normal pulses.     Heart sounds: No murmur heard.    No friction rub. No  gallop.  Pulmonary:     Effort: Pulmonary effort is normal. No respiratory distress.     Breath sounds: Normal breath sounds. No stridor. No wheezing, rhonchi or rales.  Chest:     Chest wall: No tenderness.  Abdominal:     General: Abdomen is flat. Bowel sounds are normal. There is no distension.     Palpations: Abdomen is soft. There is no mass.     Tenderness: There is no abdominal tenderness. There is no right CVA tenderness, left CVA tenderness, guarding or rebound.     Hernia: No hernia is present.  Genitourinary:    Comments: Breast and pelvic exams deferred with shared decision making Musculoskeletal:        General: No swelling, tenderness, deformity or signs of injury.     Cervical back: Normal range of motion and neck supple. No rigidity. No muscular tenderness.     Right lower leg: No edema.     Left lower leg: No edema.  Lymphadenopathy:     Cervical: No cervical adenopathy.  Skin:    General: Skin is warm and dry.     Capillary Refill: Capillary refill takes less than 2 seconds.      Coloration: Skin is not jaundiced or pale.     Findings: No bruising, erythema, lesion or rash.  Neurological:     General: No focal deficit present.     Mental Status: She is alert and oriented to person, place, and time. Mental status is at baseline.     Cranial Nerves: No cranial nerve deficit.     Sensory: No sensory deficit.     Motor: No weakness.     Coordination: Coordination normal.     Gait: Gait normal.     Deep Tendon Reflexes: Reflexes normal.  Psychiatric:        Mood and Affect: Mood normal.        Behavior: Behavior normal.        Thought Content: Thought content normal.        Judgment: Judgment normal.        09/05/2023    9:18 AM 06/30/2022    3:04 PM 06/29/2021    5:06 PM 06/16/2020   11:07 AM  6CIT Screen  What Year? 0 points 0 points 0 points 0 points  What month? 0 points 0 points 0 points 0 points  What time? 0 points 0 points 0 points 0 points  Count back from 20 0 points 0 points 0 points 0 points  Months in reverse 0 points 0 points 0 points 0 points  Repeat phrase 2 points 0 points 0 points 0 points  Total Score 2 points 0 points 0 points 0 points    Results for orders placed or performed in visit on 07/23/24  TSH   Collection Time: 07/23/24 10:12 AM  Result Value Ref Range   TSH 1.110 0.450 - 4.500 uIU/mL      Assessment & Plan:   Problem List Items Addressed This Visit       Cardiovascular and Mediastinum   Paroxysmal atrial fibrillation (HCC)   Stable. Continue to follow with cardiology. Call with any concerns.       Relevant Medications   amLODipine  (NORVASC ) 2.5 MG tablet   losartan -hydrochlorothiazide  (HYZAAR) 100-25 MG tablet   metoprolol  succinate (TOPROL -XL) 50 MG 24 hr tablet   Benign essential hypertension   Under good control on current regimen. Continue current regimen. Continue to monitor. Call with any concerns. Refills  given. Labs drawn today.        Relevant Medications   amLODipine  (NORVASC ) 2.5 MG tablet    losartan -hydrochlorothiazide  (HYZAAR) 100-25 MG tablet   metoprolol  succinate (TOPROL -XL) 50 MG 24 hr tablet   Other Relevant Orders   CBC with Differential/Platelet   Comprehensive metabolic panel with GFR   Microalbumin, Urine Waived     Endocrine   Thyroid  activity decreased   Rechecking labs today. Await results. Treat as needed.       Relevant Medications   metoprolol  succinate (TOPROL -XL) 50 MG 24 hr tablet   Other Relevant Orders   CBC with Differential/Platelet   Comprehensive metabolic panel with GFR   TSH     Musculoskeletal and Integument   Acute gout involving toe of right foot   Under good control on current regimen. Continue current regimen. Continue to monitor. Call with any concerns. Refills given. Labs drawn today.       Relevant Medications   allopurinol  (ZYLOPRIM ) 300 MG tablet   Other Relevant Orders   CBC with Differential/Platelet   Comprehensive metabolic panel with GFR   Uric acid     Other   Combined fat and carbohydrate induced hyperlipemia   Rechecking labs today. Await results. Treat as needed.       Relevant Medications   amLODipine  (NORVASC ) 2.5 MG tablet   losartan -hydrochlorothiazide  (HYZAAR) 100-25 MG tablet   metoprolol  succinate (TOPROL -XL) 50 MG 24 hr tablet   Other Relevant Orders   CBC with Differential/Platelet   Comprehensive metabolic panel with GFR   Lipid Panel w/o Chol/HDL Ratio   Morbid obesity (HCC)   Encouraged diet and exercise with goal of losing 1-2 lbs per week. Call with any concerns.       History of breast cancer   Continue to follow with oncology as needed. Continue mammograms. Call with any concerns.       Other Visit Diagnoses       Encounter for Medicare annual wellness exam    -  Primary   Preventative care discussed today as below.        Preventative Services:  Health Risk Assessment and Personalized Prevention Plan: Done today Bone Mass Measurements: up to date Breast Cancer Screening: up to  date CVD Screening: done today Cervical Cancer Screening: N/A Colon Cancer Screening: up to date Depression Screening: done today Diabetes Screening: done today Glaucoma Screening: see your eye doctor Hepatitis B vaccine: N/A Hepatitis C screening: up to date HIV Screening: up to date Flu Vaccine: up to date Lung cancer Screening: N/A Obesity Screening: done today Pneumonia Vaccines (2): up to date STI Screening: N/A  Follow up plan: Return in about 6 months (around 04/07/2025).   LABORATORY TESTING:  - Pap smear: not applicable  IMMUNIZATIONS:   - Tdap: Tetanus vaccination status reviewed: last tetanus booster within 10 years. - Influenza: Up to date - Pneumovax: Up to date - Prevnar: Up to date - Zostavax vaccine: Given elsewhere  SCREENING: -Mammogram: Up to date  - Colonoscopy: Up to date  - Bone Density: Up to date   PATIENT COUNSELING:   Advised to take 1 mg of folate supplement per day if capable of pregnancy.   Sexuality: Discussed sexually transmitted diseases, partner selection, use of condoms, avoidance of unintended pregnancy  and contraceptive alternatives.   Advised to avoid cigarette smoking.  I discussed with the patient that most people either abstain from alcohol or drink within safe limits (<=14/week and <=4 drinks/occasion for males, <=7/weeks  and <= 3 drinks/occasion for females) and that the risk for alcohol disorders and other health effects rises proportionally with the number of drinks per week and how often a drinker exceeds daily limits.  Discussed cessation/primary prevention of drug use and availability of treatment for abuse.   Diet: Encouraged to adjust caloric intake to maintain  or achieve ideal body weight, to reduce intake of dietary saturated fat and total fat, to limit sodium intake by avoiding high sodium foods and not adding table salt, and to maintain adequate dietary potassium and calcium preferably from fresh fruits, vegetables,  and low-fat dairy products.    stressed the importance of regular exercise  Injury prevention: Discussed safety belts, safety helmets, smoke detector, smoking near bedding or upholstery.   Dental health: Discussed importance of regular tooth brushing, flossing, and dental visits.    NEXT PREVENTATIVE PHYSICAL DUE IN 1 YEAR. Return in about 6 months (around 04/07/2025).                [1]  Allergies Allergen Reactions   Gabapentin      makes me feel kinda out of it   Morphine And Codeine Nausea And Vomiting   Peanut-Containing Drug Products Swelling   Tape Rash  [2]  Social History Tobacco Use  Smoking Status Never  Smokeless Tobacco Never   "

## 2024-10-08 NOTE — Assessment & Plan Note (Signed)
 Encouraged diet and exercise with goal of losing 1-2lbs per week. Call with any concerns.

## 2024-10-08 NOTE — Patient Instructions (Addendum)
 Preventative Services:  Health Risk Assessment and Personalized Prevention Plan: Done today Bone Mass Measurements: up to date Breast Cancer Screening: up to date CVD Screening: done today Cervical Cancer Screening: N/A Colon Cancer Screening: up to date Depression Screening: done today Diabetes Screening: done today Glaucoma Screening: see your eye doctor Hepatitis B vaccine: N/A Hepatitis C screening: up to date HIV Screening: up to date Flu Vaccine: up to date Lung cancer Screening: N/A Obesity Screening: done today Pneumonia Vaccines (2): up to date STI Screening: N/A  Wendy Marsh,  Thank you for taking the time for your Medicare Wellness Visit. I appreciate your continued commitment to your health goals. Please review the care plan we discussed, and feel free to reach out if I can assist you further.  Please note that Annual Wellness Visits do not include a physical exam. Some assessments may be limited, especially if the visit was conducted virtually. If needed, we may recommend an in-person follow-up with your provider.  Ongoing Care Seeing your primary care provider every 3 to 6 months helps us  monitor your health and provide consistent, personalized care.   Referrals If a referral was made during today's visit and you haven't received any updates within two weeks, please contact the referred provider directly to check on the status.  Recommended Screenings:  Health Maintenance  Topic Date Due   Zoster (Shingles) Vaccine (1 of 2) Never done   Medicare Annual Wellness Visit  09/04/2024   Breast Cancer Screening  06/13/2025   Osteoporosis screening with Bone Density Scan  11/02/2025   Cologuard (Stool DNA test)  03/06/2027   DTaP/Tdap/Td vaccine (3 - Td or Tdap) 05/20/2029   Pneumococcal Vaccine for age over 58  Completed   Flu Shot  Completed   Hepatitis C Screening  Completed   Meningitis B Vaccine  Aged Out   COVID-19 Vaccine  Discontinued       10/08/2024    11:19 AM  Advanced Directives  Does Patient Have a Medical Advance Directive? No  Would patient like information on creating a medical advance directive? No - Patient declined    Vision: Annual vision screenings are recommended for early detection of glaucoma, cataracts, and diabetic retinopathy. These exams can also reveal signs of chronic conditions such as diabetes and high blood pressure.  Dental: Annual dental screenings help detect early signs of oral cancer, gum disease, and other conditions linked to overall health, including heart disease and diabetes.  Please see the attached documents for additional preventive care recommendations.

## 2024-10-08 NOTE — Progress Notes (Signed)
 "  No chief complaint on file.    Subjective:   Wendy Marsh is a 69 y.o. female who presents for a Medicare Annual Wellness Visit.  Fall Screening Falls in the past year?: 0 Number of falls in past year: 0 Was there an injury with Fall?: 0 Fall Risk Category Calculator: 0 Patient Fall Risk Level: High fall risk  Fall Risk Patient at Risk for Falls Due to: No Fall Risks (Uses rollator) Fall risk Follow up: Falls evaluation completed  Advance Directives (For Healthcare) Does Patient Have a Medical Advance Directive?: No Would patient like information on creating a medical advance directive?: No - Patient declined    Allergies (verified) Gabapentin , Morphine and codeine, Peanut-containing drug products, and Tape   Current Medications (verified) Outpatient Encounter Medications as of 10/08/2024  Medication Sig   allopurinol  (ZYLOPRIM ) 300 MG tablet Take 1 tablet (300 mg total) by mouth daily.   amLODipine  (NORVASC ) 2.5 MG tablet Take 0.5 tablets (1.25 mg total) by mouth daily.   apixaban (ELIQUIS) 5 MG TABS tablet Take 5 mg by mouth in the morning and at bedtime.   EPINEPHrine  0.3 mg/0.3 mL IJ SOAJ injection Inject 0.3 mg into the muscle as needed for anaphylaxis.   flecainide (TAMBOCOR) 100 MG tablet Take 100 mg by mouth 2 (two) times daily.   levothyroxine  (SYNTHROID ) 100 MCG tablet Take 1 tablet (100 mcg total) by mouth daily before breakfast. TAKE 1 TABLET BY MOUTH ONCE DAILY (TAKE  WITH   FOR   TOTAL   levothyroxine  (SYNTHROID ) 88 MCG tablet Take 1 tablet (88 mcg total) by mouth daily. Take with the 100mcg for 188mcg total.   losartan -hydrochlorothiazide  (HYZAAR) 100-25 MG tablet Take 1 tablet by mouth once daily   metoprolol  succinate (TOPROL -XL) 50 MG 24 hr tablet TAKE 1 TABLET BY MOUTH ONCE DAILY (TAKE  WITH  OR  IMMEDIATELY  FOLLOWING  A  MEAL)   No facility-administered encounter medications on file as of 10/08/2024.    History: Past Medical History:   Diagnosis Date   Allergy 2019   Arthritis    Benign essential hypertension    Difficult intubation    DOE (dyspnea on exertion)    Endometrial polyp    Family history of adverse reaction to anesthesia    a.) PONV in 1st degree relative (sister)   Gout    History of 2019 novel coronavirus disease (COVID-19) 09/2020   Hypothyroidism    Invasive carcinoma of RIGHT breast (HCC) 04/2018   a.) pathology (+) IMC (T2, N0; G3, ER/PR -, HER2/neu -); Tx'd with neoadjuvant systemic antineoplastic chemotherapy + surgical excision (lumpectomy)   Lumbago    Mixed hyperlipidemia    Morbid obesity with BMI of 50.0-59.9, adult (HCC)    On apixaban therapy    Osteoporosis    Persistent atrial fibrillation (HCC)    a.) CHA2DS2VASc = 3 (age, sex, HTN) as of 11/24/2023; b.) rate/rhythm maintained on oral flecainide + metoprolol  succinate; chronically anticoagulated with apixaban   Personal history of chemotherapy    Personal history of radiation therapy    Past Surgical History:  Procedure Laterality Date   BREAST BIOPSY Right 05/02/2018   Invasive mammary carcinoma, grade 3, neo adj chemo   BREAST LUMPECTOMY Right 10/29/2018   NO RESIDUAL MALIGNANCY IDENTIFIED.    BREAST LUMPECTOMY WITH NEEDLE LOCALIZATION AND AXILLARY SENTINEL LYMPH NODE BX Right 10/29/2018   Procedure: BREAST LUMPECTOMY WITH NEEDLE LOCALIZATION AND SENTINEL LYMPH NODE BX;  Surgeon: Davis, Jason Evan,  MD;  Location: [facility];  Service: General;  Laterality: Right;   CATARACT EXTRACTION W/ INTRAOCULAR LENS  IMPLANT, BILATERAL Bilateral 2024   HYSTEROSCOPY WITH D & C N/A 11/27/2023   Procedure: D&C, IUD PLACEMENT;  Surgeon: Janit Alm Agent, MD;  Location: [facility];  Service: Gynecology;  Laterality: N/A;   INTRAUTERINE DEVICE (IUD) INSERTION N/A 11/27/2023   Procedure: INTRAUTERINE DEVICE (IUD) PLACEMENT;  Surgeon: Janit Alm Agent, MD;  Location: [facility];  Service: Gynecology;  Laterality: N/A;   JOINT REPLACEMENT  01/2009    PORT-A-CATH REMOVAL  2022   PORTACATH PLACEMENT Right 05/11/2018   Procedure: INSERTION  I.J PORT-A-CATH;  Surgeon: Dessa Reyes ORN, MD;  Location: [facility];  Service: General;  Laterality: Right;   REPLACEMENT TOTAL KNEE Left 2010   Family History  Problem Relation Age of Onset   Diabetes Mother    Congestive Heart Failure Mother    Breast cancer Maternal Grandmother        dx 44s; deceased 39s   Cancer Maternal Uncle 37       deceased 91s   Other Father        No information on father or paternal relatives   Breast cancer Other 52       paternal half-sister; also esophageal ca at 12; currently 74   Diabetes Other    Hypertension Other    Kidney disease Other    Thyroid  disease Other    Cancer Sister    Social History   Occupational History   Not on file  Tobacco Use   Smoking status: Never   Smokeless tobacco: Never  Vaping Use   Vaping status: Never Used  Substance and Sexual Activity   Alcohol use: No   Drug use: No   Sexual activity: Not Currently    Birth control/protection: Post-menopausal   Tobacco Counseling Counseling given: Not Answered  SDOH Screenings   Food Insecurity: No Food Insecurity (06/06/2024)  Housing: Low Risk (06/06/2024)  Transportation Needs: No Transportation Needs (06/06/2024)  Utilities: Not At Risk (06/30/2022)  Alcohol Screen: Low Risk (06/30/2022)  Depression (PHQ2-9): Low Risk (06/10/2024)  Financial Resource Strain: Low Risk (06/06/2024)  Physical Activity: Inactive (06/06/2024)  Social Connections: Unknown (06/06/2024)  Stress: No Stress Concern Present (06/06/2024)  Tobacco Use: Low Risk  (07/24/2024)   Received from [facility]   See flowsheets for full screening details  Depression Screen PHQ 2 & 9 Depression Scale- Over the past 2 weeks, how often have you been bothered by any of the following problems? Little interest or pleasure in doing things: 0 Feeling down, depressed, or hopeless (PHQ Adolescent also  includes...irritable): 0 PHQ-2 Total Score: 0 Trouble falling or staying asleep, or sleeping too much: 1 Feeling tired or having little energy: 0 Poor appetite or overeating (PHQ Adolescent also includes...weight loss): 0 Feeling bad about yourself - or that you are a failure or have let yourself or your family down: 0 Trouble concentrating on things, such as reading the newspaper or watching television (PHQ Adolescent also includes...like school work): 0 Moving or speaking so slowly that other people could have noticed. Or the opposite - being so fidgety or restless that you have been moving around a lot more than usual: 0 Thoughts that you would be better off dead, or of hurting yourself in some way: 0 PHQ-9 Total Score: 1 If you checked off any problems, how difficult have these problems made it for you to do your work, take care of  things at home, or get along with other people?: Not difficult at all  Depression Treatment Depression Interventions/Treatment : [id number] Score <4 Follow-up Not Indicated     Goals Addressed   None          Objective:    There were no vitals filed for this visit. There is no height or weight on file to calculate BMI.  Hearing/Vision screen No results found. Immunizations and Health Maintenance Health Maintenance  Topic Date Due   Zoster Vaccines- Shingrix (1 of 2) Never done   Medicare Annual Wellness (AWV)  09/04/2024   Mammogram  06/13/2025   Fecal DNA (Cologuard)  03/06/2027   DTaP/Tdap/Td (3 - Td or Tdap) 05/20/2029   Pneumococcal Vaccine: 50+ Years  Completed   Influenza Vaccine  Completed   Bone Density Scan  Completed   Hepatitis C Screening  Completed   Meningococcal B Vaccine  Aged Out   COVID-19 Vaccine  Discontinued        Assessment/Plan:  This is a routine wellness examination for [place].  Patient Care Team: Vicci Duwaine SQUIBB, DO as PCP - General (Family Medicine) Marchia Drivers, MD as Referring Physician (Orthopedic  Surgery) Cindie Jesusa HERO, RN as Oncology Nurse Navigator Byrnett, Reyes ORN, MD (General Surgery) Babara Call, MD as Medical Oncologist (Medical Oncology) Lenn Aran, MD as Referring Physician (Radiation Oncology)  I have personally reviewed and noted the following in the patients chart:   Medical and social history Use of alcohol, tobacco or illicit drugs  Current medications and supplements including opioid prescriptions. Functional ability and status Nutritional status Physical activity Advanced directives List of other physicians Hospitalizations, surgeries, and ER visits in previous 12 months Vitals Screenings to include cognitive, depression, and falls Referrals and appointments  Orders Placed This Encounter  Procedures   CBC with Differential/Platelet   Comprehensive metabolic panel with GFR    Has the patient fasted?:   Yes   Lipid Panel w/o Chol/HDL Ratio    Has the patient fasted?:   Yes   TSH   Microalbumin, Urine Waived   Uric acid   In addition, I have reviewed and discussed with patient certain preventive protocols, quality metrics, and best practice recommendations. A written personalized care plan for preventive services as well as general preventive health recommendations were provided to patient.   Reuel Lamadrid, DO   10/08/2024   No follow-ups on file.  After Visit Summary: (In Person-Printed) AVS printed and given to the patient   "

## 2024-10-08 NOTE — Assessment & Plan Note (Signed)
Stable. Continue to follow with cardiology. Call with any concerns.  

## 2024-10-08 NOTE — Assessment & Plan Note (Signed)
 Under good control on current regimen. Continue current regimen. Continue to monitor. Call with any concerns. Refills given. Labs drawn today.

## 2024-10-08 NOTE — Assessment & Plan Note (Signed)
 Continue to follow with oncology as needed. Continue mammograms. Call with any concerns.

## 2024-10-08 NOTE — Assessment & Plan Note (Signed)
 Rechecking labs today. Await results. Treat as needed.

## 2024-10-09 LAB — CBC WITH DIFFERENTIAL/PLATELET
Basophils Absolute: 0 x10E3/uL (ref 0.0–0.2)
Basos: 0 %
EOS (ABSOLUTE): 0.2 x10E3/uL (ref 0.0–0.4)
Eos: 2 %
Hematocrit: 40.4 % (ref 34.0–46.6)
Hemoglobin: 12.6 g/dL (ref 11.1–15.9)
Immature Grans (Abs): 0 x10E3/uL (ref 0.0–0.1)
Immature Granulocytes: 0 %
Lymphocytes Absolute: 1.6 x10E3/uL (ref 0.7–3.1)
Lymphs: 15 %
MCH: 27.1 pg (ref 26.6–33.0)
MCHC: 31.2 g/dL — ABNORMAL LOW (ref 31.5–35.7)
MCV: 87 fL (ref 79–97)
Monocytes Absolute: 0.6 x10E3/uL (ref 0.1–0.9)
Monocytes: 6 %
Neutrophils Absolute: 7.8 x10E3/uL — ABNORMAL HIGH (ref 1.4–7.0)
Neutrophils: 77 %
Platelets: 335 x10E3/uL (ref 150–450)
RBC: 4.65 x10E6/uL (ref 3.77–5.28)
RDW: 14.5 % (ref 11.7–15.4)
WBC: 10.2 x10E3/uL (ref 3.4–10.8)

## 2024-10-09 LAB — COMPREHENSIVE METABOLIC PANEL WITH GFR
ALT: 18 IU/L (ref 0–32)
AST: 14 IU/L (ref 0–40)
Albumin: 3.9 g/dL (ref 3.9–4.9)
Alkaline Phosphatase: 115 IU/L (ref 49–135)
BUN/Creatinine Ratio: 20 (ref 12–28)
BUN: 17 mg/dL (ref 8–27)
Bilirubin Total: 0.3 mg/dL (ref 0.0–1.2)
CO2: 29 mmol/L (ref 20–29)
Calcium: 9.2 mg/dL (ref 8.7–10.3)
Chloride: 95 mmol/L — ABNORMAL LOW (ref 96–106)
Creatinine, Ser: 0.83 mg/dL (ref 0.57–1.00)
Globulin, Total: 3.1 g/dL (ref 1.5–4.5)
Glucose: 82 mg/dL (ref 70–99)
Potassium: 3.7 mmol/L (ref 3.5–5.2)
Sodium: 141 mmol/L (ref 134–144)
Total Protein: 7 g/dL (ref 6.0–8.5)
eGFR: 76 mL/min/1.73

## 2024-10-09 LAB — LIPID PANEL W/O CHOL/HDL RATIO
Cholesterol, Total: 231 mg/dL — ABNORMAL HIGH (ref 100–199)
HDL: 52 mg/dL
LDL Chol Calc (NIH): 158 mg/dL — ABNORMAL HIGH (ref 0–99)
Triglycerides: 117 mg/dL (ref 0–149)
VLDL Cholesterol Cal: 21 mg/dL (ref 5–40)

## 2024-10-09 LAB — TSH: TSH: 8.89 u[IU]/mL — ABNORMAL HIGH (ref 0.450–4.500)

## 2024-10-09 LAB — URIC ACID: Uric Acid: 4.2 mg/dL (ref 3.0–7.2)

## 2024-10-14 ENCOUNTER — Ambulatory Visit: Payer: Self-pay | Admitting: Family Medicine

## 2024-10-14 DIAGNOSIS — E039 Hypothyroidism, unspecified: Secondary | ICD-10-CM

## 2024-10-14 MED ORDER — LEVOTHYROXINE SODIUM 200 MCG PO TABS: 200.0000 ug | ORAL_TABLET | Freq: Every day | ORAL | 0 refills | Status: AC

## 2024-10-14 NOTE — Progress Notes (Signed)
 Called x3 to get scheduled,someone keeps answering and hanging up

## 2024-10-18 NOTE — Progress Notes (Signed)
 Scheduled

## 2024-11-05 ENCOUNTER — Telehealth: Payer: Self-pay | Admitting: Oncology

## 2024-11-05 NOTE — Telephone Encounter (Signed)
 Due to weather and r/s multiple pt appts, MD schedule is overbooked.   I called pt and lvm if she would like to push out her 1 year appt to [facility], scheduling phone number provided for pt if she would like this. IF not, I stated we would see her on 2/12 for her currently scheduled appt

## 2024-11-14 ENCOUNTER — Other Ambulatory Visit: Payer: Medicare Other

## 2024-11-14 ENCOUNTER — Ambulatory Visit: Payer: Medicare Other | Admitting: Oncology

## 2024-12-02 ENCOUNTER — Other Ambulatory Visit

## 2024-12-27 ENCOUNTER — Inpatient Hospital Stay

## 2024-12-27 ENCOUNTER — Inpatient Hospital Stay: Admitting: Oncology

## 2025-04-14 ENCOUNTER — Ambulatory Visit: Admitting: Family Medicine
